# Patient Record
Sex: Male | Born: 1950
Health system: Southern US, Community
[De-identification: ages and names within clinical notes are randomized; demographics above are authoritative.]

## PROBLEM LIST (undated history)

## (undated) DIAGNOSIS — E119 Type 2 diabetes mellitus without complications: Secondary | ICD-10-CM

## (undated) DIAGNOSIS — G4730 Sleep apnea, unspecified: Secondary | ICD-10-CM

## (undated) DIAGNOSIS — M1990 Unspecified osteoarthritis, unspecified site: Secondary | ICD-10-CM

## (undated) DIAGNOSIS — E7800 Pure hypercholesterolemia, unspecified: Secondary | ICD-10-CM

## (undated) DIAGNOSIS — E785 Hyperlipidemia, unspecified: Secondary | ICD-10-CM

## (undated) DIAGNOSIS — C801 Malignant (primary) neoplasm, unspecified: Secondary | ICD-10-CM

## (undated) DIAGNOSIS — K219 Gastro-esophageal reflux disease without esophagitis: Secondary | ICD-10-CM

## (undated) DIAGNOSIS — N189 Chronic kidney disease, unspecified: Secondary | ICD-10-CM

## (undated) DIAGNOSIS — R739 Hyperglycemia, unspecified: Secondary | ICD-10-CM

## (undated) DIAGNOSIS — M109 Gout, unspecified: Secondary | ICD-10-CM

## (undated) DIAGNOSIS — I10 Essential (primary) hypertension: Secondary | ICD-10-CM

## (undated) DIAGNOSIS — Z96649 Presence of unspecified artificial hip joint: Secondary | ICD-10-CM

## (undated) DIAGNOSIS — I700 Atherosclerosis of aorta: Secondary | ICD-10-CM

## (undated) HISTORY — DX: Hyperlipidemia, unspecified: E78.5

## (undated) HISTORY — PX: NASAL SEPTUM SURGERY: SHX37

## (undated) HISTORY — PX: OTHER SURGICAL HISTORY: SHX169

## (undated) HISTORY — PX: HERNIA REPAIR: SHX51

## (undated) HISTORY — DX: Hyperglycemia, unspecified: R73.9

## (undated) HISTORY — PX: COLONOSCOPY: SHX174

## (undated) HISTORY — DX: Presence of unspecified artificial hip joint: Z96.649

## (undated) HISTORY — DX: Gout, unspecified: M10.9

## (undated) HISTORY — DX: Atherosclerosis of aorta: I70.0

---

## 2010-09-01 ENCOUNTER — Other Ambulatory Visit: Payer: Self-pay | Admitting: Urology

## 2010-09-01 ENCOUNTER — Ambulatory Visit (INDEPENDENT_AMBULATORY_CARE_PROVIDER_SITE_OTHER): Payer: Managed Care, Other (non HMO) | Admitting: Urology

## 2010-09-01 DIAGNOSIS — N529 Male erectile dysfunction, unspecified: Secondary | ICD-10-CM

## 2010-09-01 DIAGNOSIS — R972 Elevated prostate specific antigen [PSA]: Secondary | ICD-10-CM

## 2010-09-01 DIAGNOSIS — R978 Other abnormal tumor markers: Secondary | ICD-10-CM

## 2010-10-06 ENCOUNTER — Other Ambulatory Visit: Payer: Self-pay | Admitting: Urology

## 2010-10-06 ENCOUNTER — Inpatient Hospital Stay (HOSPITAL_COMMUNITY): Admission: RE | Admit: 2010-10-06 | Discharge: 2010-10-06 | Payer: Self-pay | Source: Ambulatory Visit

## 2010-10-06 ENCOUNTER — Ambulatory Visit (HOSPITAL_COMMUNITY)
Admission: RE | Admit: 2010-10-06 | Discharge: 2010-10-06 | Disposition: A | Discharge status: Home / Self Care | Payer: Managed Care, Other (non HMO) | Source: Ambulatory Visit | Attending: Urology | Admitting: Urology

## 2010-10-06 DIAGNOSIS — R972 Elevated prostate specific antigen [PSA]: Secondary | ICD-10-CM

## 2010-10-06 NOTE — Progress Notes (Signed)
Prostate bx done under local. Procedure start time 1202-1212. 12 prostate biopsies taken to [facility] lab by Dr.Dahlstedt.

## 2010-11-17 ENCOUNTER — Ambulatory Visit (INDEPENDENT_AMBULATORY_CARE_PROVIDER_SITE_OTHER): Payer: Managed Care, Other (non HMO) | Admitting: Urology

## 2010-11-17 DIAGNOSIS — C61 Malignant neoplasm of prostate: Secondary | ICD-10-CM

## 2011-02-05 ENCOUNTER — Ambulatory Visit (INDEPENDENT_AMBULATORY_CARE_PROVIDER_SITE_OTHER): Payer: Managed Care, Other (non HMO) | Admitting: Urology

## 2011-02-05 DIAGNOSIS — N500 Atrophy of testis: Secondary | ICD-10-CM

## 2011-02-05 DIAGNOSIS — C61 Malignant neoplasm of prostate: Secondary | ICD-10-CM

## 2011-02-19 ENCOUNTER — Other Ambulatory Visit: Payer: Self-pay | Admitting: Urology

## 2011-04-09 ENCOUNTER — Encounter (HOSPITAL_COMMUNITY): Payer: Self-pay | Admitting: Pharmacy Technician

## 2011-04-15 ENCOUNTER — Encounter (HOSPITAL_COMMUNITY): Payer: Self-pay

## 2011-04-15 ENCOUNTER — Inpatient Hospital Stay (HOSPITAL_COMMUNITY)
Admission: RE | Admit: 2011-04-15 | Discharge: 2011-04-15 | Disposition: A | Discharge status: Home / Self Care | Payer: Managed Care, Other (non HMO) | Source: Ambulatory Visit | Attending: Urology | Admitting: Urology

## 2011-04-15 HISTORY — DX: Unspecified osteoarthritis, unspecified site: M19.90

## 2011-04-15 HISTORY — DX: Chronic kidney disease, unspecified: N18.9

## 2011-04-15 HISTORY — DX: Malignant (primary) neoplasm, unspecified: C80.1

## 2011-04-15 LAB — CBC
HCT: 42.8 % (ref 39.0–52.0)
MCHC: 33.2 g/dL (ref 30.0–36.0)
RDW: 13.4 % (ref 11.5–15.5)

## 2011-04-15 LAB — BASIC METABOLIC PANEL
BUN: 14 mg/dL (ref 6–23)
GFR calc Af Amer: 83 mL/min — ABNORMAL LOW (ref >90)
GFR calc non Af Amer: 72 mL/min — ABNORMAL LOW (ref >90)
Potassium: 4.5 mEq/L (ref 3.5–5.1)

## 2011-04-15 LAB — SURGICAL PCR SCREEN
MRSA, PCR: NEGATIVE
Staphylococcus aureus: POSITIVE — AB

## 2011-04-15 MED ORDER — CEFAZOLIN SODIUM 1-5 GM-% IV SOLN: 1.0000 g | INTRAVENOUS | Status: DC

## 2011-04-15 NOTE — Patient Instructions (Signed)
20 Thurmon Mizell  04/15/2011   Your procedure is scheduled on:  04/20/11 [id number] am  Report to [facility] at 0515 AM.  Call this number if you have problems the morning of surgery: [phone]   Remember:   Do not eat food:After Midnight.  May have clear liquids:until Midnight .  Clear liquids include soda, tea, black coffee, apple or grape juice, broth.  Take these medicines the morning of surgery with A SIP OF WATER:    Do not wear jewelry,   Do not wear lotions, powders, or perfumes.   .  Do not bring valuables to the hospital.  Contacts, dentures or bridgework may not be worn into surgery.  Leave suitcase in the car. After surgery it may be brought to your room.  For patients admitted to the hospital, checkout time is 11:00 AM the day of discharge.      Special Instructions: CHG Shower Use Special Wash: 1/2 bottle night before surgery and 1/2 bottle morning of surgery. shower chin to toes with CHG.  Wash face and private parts with regular soap.     Please read over the following fact sheets that you were given: MRSA Information, coughing and deep breathing exercises, leg exercises, Blood Transfusion Fact Sheet

## 2011-04-16 NOTE — Pre-Procedure Instructions (Signed)
04/16/11 Nurse left message on home phone regarding positvie pcr screen for staph.  Asked pt to call back and confirm he received message.

## 2011-04-16 NOTE — Pre-Procedure Instructions (Signed)
04/16/11 Pt received message regarding PCR.  Will follow instructions.

## 2011-04-19 NOTE — H&P (Signed)
Active Problems 1. Organic Impotence 607.84 2. Prostate Cancer 185 3. PSA,Elevated 790.93 4. Testicular Atrophy 608.3  History of Present Illness  [name] [name] is a 61 yo [facility] patient of Dr. Retta Diones with a T1c Gleason 7(3+4) prostate cancer found in the right lateral core on biopsy on 8/17.  His prostate was 24cc and his PSA was 4.65.  He has seen Dr. Kathrynn Running but he is still most interested in surgical therapy.   Past Medical History 1. History of  Gout 274.9 2. History of  Hypercholesterolemia 272.0  Surgical History 1. History of  Biopsy Of The Prostate Needle 2. History of  Inguinal Hernia Repair Left 3. History of  Nasal Septal Deviation Repair  Current Meds 1. Allopurinol 100 MG Oral Tablet; Therapy: (Recorded:12Jun2012) to 2. Calcium TABS; Therapy: (Recorded:12Jun2012) to 3. Ibuprofen 200 MG Oral Capsule; Therapy: (Recorded:12Jun2012) to 4. Levofloxacin 500 MG Oral Tablet; One tablet the day before procedure, the day of procedure, and  the day after procedure; Therapy: 12Jun2012 to (Evaluate:16Jun2012)  Requested for:  12Jun2012; Last Rx:12Jun2012 5. Magnesium CAPS; Therapy: (Recorded:12Jun2012) to 6. Multi-Vitamin TABS; Therapy: (Recorded:12Jun2012) to 7. Niacin 500 MG Oral Tablet; Therapy: (Recorded:12Jun2012) to 8. Omega 3 CAPS; Therapy: (Recorded:12Jun2012) to 9. Pravastatin Sodium 80 MG Oral Tablet; Therapy: (Recorded:12Jun2012) to 10. Vitamin B Complex CAPS; Therapy: (Recorded:12Jun2012) to 11. Vitamin E CAPS; Therapy: (Recorded:12Jun2012) to 12. Zinc TABS; Therapy: (Recorded:12Jun2012) to  Allergies 1. No Known Drug Allergies  Family History 1. Family history of  Cancer 2. Family history of  Death In The Family Father 3. Family history of  Death In The Family Mother 4. Family history of  Diabetes Mellitus V18.0 5. Family history of  Hypertension V17.49 6. Fraternal history of  Prostate Cancer V16.42 Denied  7. Family history of  Family Health Status Number Of  Children  Social History 1. Alcohol Use 2/wk 2. Caffeine Use 6/d 3. Former Smoker V15.82 1 ppd x 8 yrs. Quit 1986. 4. Marital History - Single 5. Occupation: Clinical dietary mgr  Review of Systems Genitourinary, constitutional, skin, eye, otolaryngeal, hematologic/lymphatic, cardiovascular, pulmonary, endocrine, musculoskeletal, gastrointestinal, neurological and psychiatric system(s) were reviewed and pertinent findings if present are noted.  Genitourinary: post-void dribbling, decreased libido (mild) and erectile dysfunction (mild).  Cardiovascular: no chest pain and no leg swelling.  Respiratory: no shortness of breath.  Psychiatric: anxiety.    Vitals Vital Signs [Data Includes: Last 1 Day]  16Nov2012 09:05AM  BMI Calculated: 31.83 BSA Calculated: 2.09 Height: 5 ft 8 in Weight: 210 lb  Blood Pressure: 156 / 88 Temperature: 97.2 F Heart Rate: 79  Physical Exam Constitutional: Well nourished and well developed . No acute distress.  ENT:. The ears and nose are normal in appearance.  Neck: The appearance of the neck is normal and no neck mass is present.  Pulmonary: No respiratory distress and normal respiratory rhythm and effort.  Cardiovascular: Heart rate and rhythm are normal . No peripheral edema.  Abdomen: The abdomen is soft and nontender. No masses are palpated. No CVA tenderness. No hernias are palpable. No hepatosplenomegaly noted.  Rectal: Rectal exam demonstrates normal sphincter tone, no tenderness and no masses. Estimated prostate size is 2+. (with mild induration at the right base). The prostate has no nodularity and is not tender. The left seminal vesicle is nonpalpable. The right seminal vesicle is nonpalpable. The perineum is normal on inspection.  Genitourinary: Examination of the penis demonstrates no discharge, no masses, no lesions and a normal meatus. The scrotum is without lesions. The  right epididymis is palpably normal and non-tender. The left  epididymis is palpably normal and non-tender. The right testis is atrophic, but non-tender and without masses. The left testis is atrophic, but non-tender and without masses.  Lymphatics: The supraclavicular, axillary, femoral and inguinal nodes are not enlarged or tender.  Skin: Normal skin turgor, no visible rash and no visible skin lesions.  Neuro/Psych:. Mood and affect are appropriate.    Results/Data Urine [Data Includes: Last 1 Day]   16Nov2012  COLOR YELLOW   APPEARANCE CLEAR   SPECIFIC GRAVITY 1.020   pH 5.0   GLUCOSE NEG mg/dL  BILIRUBIN NEG   KETONE NEG mg/dL  BLOOD NEG   PROTEIN NEG mg/dL  UROBILINOGEN 0.2 mg/dL  NITRITE NEG   LEUKOCYTE ESTERASE NEG    Old records or history reviewed: Records from Dr. Kathrynn Running and Dahlstedt reviewed.  The following clinical lab reports were reviewed:  Path report and labs reviewed.    Assessment 1. Prostate Cancer 185 2. PSA,Elevated 790.93 3. 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 Testicular Atrophy 608.3   He has T1c Gleason 7 prostate cancer and is interested in surgical therapy.   Plan Prostate Cancer (185)  1. PT Referral Referral  Referral  Requested for: 16Nov2012 PSA,Elevated (790.93)  2. PSA  Requested for: 16Nov2012 3. Follow-up Schedule Surgery Office  Follow-up  Requested for: 16Nov2012 Testicular Atrophy (608.3)  4. TESTOSTERONE  Requested for: 16Nov2012   I am going to recheck his PSA and testosterone. He will be set up for a RALP with nerve spare and nodes.  I have reviewed the risks as noted below.   Discussion/Summary  The patient was counseled about the natural history of prostate cancer and the standard treatment options that are available for prostate cancer. It was explained to him how his age and life expectancy, clinical stage, Gleason score, and PSA affect his prognosis, the decision to proceed with additional staging studies, as well as how that information influences recommended treatment strategies. We  discussed the roles for active surveillance, radiation therapy, surgical therapy, androgen deprivation, as well as ablative therapy options for the treatment of prostate cancer as appropriate to his individual cancer situation. We discussed the risks and benefits of these options with regard to their impact on cancer control and also in terms of potential adverse events, complications, and impact on quiality of life particularly related to urinary, bowel, and sexual function. The patient was encouraged to ask questions throughout the discussion today and all questions were answered to his stated satisfaction. In addition, the patient was provided with and/or directed to appropriate resources and literature for further education about prostate cancer and treatment options.   We discussed surgical therapy for prostate cancer including the different available surgical approaches. We discussed, in detail, the risks and expectations of surgery with regard to cancer control, urinary control, and erectile function as well as the expected postoperative recovery process. Additional risks of surgery including but not limited to bleeding, infection, hernia formation, nerve damage, lymphocele formation, bowel/rectal injury potentially necessitating colostomy, damage to the urinary tract resulting in urine leakage, urethral stricture, and the cardiopulmonary risks such as myocardial infarction, stroke, death, venothromboembolism, etc. were explained. The risk of open surgical conversion for robotic/laparoscopic prostatectomy was also discussed.   30 minutes were spent in face to face consultation with patient today.

## 2011-04-20 ENCOUNTER — Inpatient Hospital Stay (HOSPITAL_COMMUNITY): Payer: Managed Care, Other (non HMO) | Admitting: Anesthesiology

## 2011-04-20 ENCOUNTER — Other Ambulatory Visit: Payer: Self-pay | Admitting: Urology

## 2011-04-20 ENCOUNTER — Inpatient Hospital Stay (HOSPITAL_COMMUNITY)
Admission: RE | Admit: 2011-04-20 | Discharge: 2011-04-21 | DRG: 708 | Disposition: A | Discharge status: Home / Self Care | Payer: Managed Care, Other (non HMO) | Source: Ambulatory Visit | Attending: Urology | Admitting: Urology

## 2011-04-20 ENCOUNTER — Encounter (HOSPITAL_COMMUNITY): Payer: Self-pay | Admitting: *Deleted

## 2011-04-20 ENCOUNTER — Encounter (HOSPITAL_COMMUNITY)
Admission: RE | Disposition: A | Discharge status: Home / Self Care | Payer: Self-pay | Source: Ambulatory Visit | Attending: Urology

## 2011-04-20 ENCOUNTER — Encounter (HOSPITAL_COMMUNITY): Payer: Self-pay | Admitting: Anesthesiology

## 2011-04-20 DIAGNOSIS — R972 Elevated prostate specific antigen [PSA]: Secondary | ICD-10-CM | POA: Diagnosis present

## 2011-04-20 DIAGNOSIS — N500 Atrophy of testis: Secondary | ICD-10-CM | POA: Diagnosis present

## 2011-04-20 DIAGNOSIS — M109 Gout, unspecified: Secondary | ICD-10-CM | POA: Diagnosis present

## 2011-04-20 DIAGNOSIS — C61 Malignant neoplasm of prostate: Principal | ICD-10-CM | POA: Diagnosis present

## 2011-04-20 DIAGNOSIS — Z87891 Personal history of nicotine dependence: Secondary | ICD-10-CM

## 2011-04-20 DIAGNOSIS — E7800 Pure hypercholesterolemia, unspecified: Secondary | ICD-10-CM | POA: Diagnosis present

## 2011-04-20 DIAGNOSIS — N529 Male erectile dysfunction, unspecified: Secondary | ICD-10-CM | POA: Diagnosis present

## 2011-04-20 HISTORY — PX: ROBOT ASSISTED LAPAROSCOPIC RADICAL PROSTATECTOMY: SHX5141

## 2011-04-20 LAB — HEMOGLOBIN AND HEMATOCRIT, BLOOD
HCT: 42.6 % (ref 39.0–52.0)
Hemoglobin: 14.1 g/dL (ref 13.0–17.0)

## 2011-04-20 LAB — TYPE AND SCREEN

## 2011-04-20 SURGERY — ROBOTIC ASSISTED LAPAROSCOPIC RADICAL PROSTATECTOMY
Anesthesia: General | Site: Abdomen | Wound class: Clean

## 2011-04-20 MED ORDER — GLYCOPYRROLATE 0.2 MG/ML IJ SOLN
INTRAMUSCULAR | Status: DC | PRN
  Administered 2011-04-20: .8 mg via INTRAVENOUS

## 2011-04-20 MED ORDER — CEFAZOLIN SODIUM-DEXTROSE 2-3 GM-% IV SOLR
2.0000 g | Freq: Once | INTRAVENOUS | Status: AC
  Administered 2011-04-20: 2 g via INTRAVENOUS

## 2011-04-20 MED ORDER — ACETAMINOPHEN 10 MG/ML IV SOLN
1000.0000 mg | Freq: Four times a day (QID) | INTRAVENOUS | Status: AC
  Administered 2011-04-20 – 2011-04-21 (×4): 1000 mg via INTRAVENOUS
  Filled 2011-04-20 (×4): qty 100

## 2011-04-20 MED ORDER — DOCUSATE SODIUM 100 MG PO CAPS
100.0000 mg | ORAL_CAPSULE | Freq: Two times a day (BID) | ORAL | Status: DC
  Administered 2011-04-20 – 2011-04-21 (×2): 100 mg via ORAL
  Filled 2011-04-20 (×3): qty 1

## 2011-04-20 MED ORDER — HYDROMORPHONE HCL PF 1 MG/ML IJ SOLN
INTRAMUSCULAR | Status: DC | PRN
  Administered 2011-04-20 (×3): 0.5 mg via INTRAVENOUS

## 2011-04-20 MED ORDER — ACETAMINOPHEN 325 MG PO TABS: 650.0000 mg | ORAL_TABLET | ORAL | Status: DC | PRN

## 2011-04-20 MED ORDER — ALLOPURINOL 100 MG PO TABS
100.0000 mg | ORAL_TABLET | Freq: Every day | ORAL | Status: DC
  Administered 2011-04-20 – 2011-04-21 (×2): 100 mg via ORAL
  Filled 2011-04-20 (×2): qty 1

## 2011-04-20 MED ORDER — ONDANSETRON HCL 4 MG/2ML IJ SOLN
INTRAMUSCULAR | Status: DC | PRN
  Administered 2011-04-20: 4 mg via INTRAVENOUS

## 2011-04-20 MED ORDER — ZOLPIDEM TARTRATE 5 MG PO TABS: 5.0000 mg | ORAL_TABLET | Freq: Every evening | ORAL | Status: DC | PRN

## 2011-04-20 MED ORDER — PROMETHAZINE HCL 25 MG/ML IJ SOLN: 6.2500 mg | INTRAMUSCULAR | Status: DC | PRN

## 2011-04-20 MED ORDER — DIPHENHYDRAMINE HCL 12.5 MG/5ML PO ELIX
12.5000 mg | ORAL_SOLUTION | Freq: Four times a day (QID) | ORAL | Status: DC | PRN
  Filled 2011-04-20: qty 5

## 2011-04-20 MED ORDER — ROCURONIUM BROMIDE 100 MG/10ML IV SOLN
INTRAVENOUS | Status: DC | PRN
  Administered 2011-04-20: 50 mg via INTRAVENOUS
  Administered 2011-04-20: 15 mg via INTRAVENOUS
  Administered 2011-04-20: 20 mg via INTRAVENOUS
  Administered 2011-04-20 (×2): 15 mg via INTRAVENOUS

## 2011-04-20 MED ORDER — KCL IN DEXTROSE-NACL 20-5-0.45 MEQ/L-%-% IV SOLN
INTRAVENOUS | Status: DC
  Administered 2011-04-20 – 2011-04-21 (×3): via INTRAVENOUS
  Filled 2011-04-20 (×8): qty 1000

## 2011-04-20 MED ORDER — FENTANYL CITRATE 0.05 MG/ML IJ SOLN
INTRAMUSCULAR | Status: DC | PRN
  Administered 2011-04-20 (×2): 50 ug via INTRAVENOUS
  Administered 2011-04-20: 100 ug via INTRAVENOUS
  Administered 2011-04-20: 50 ug via INTRAVENOUS

## 2011-04-20 MED ORDER — HYDROMORPHONE HCL PF 1 MG/ML IJ SOLN
0.5000 mg | INTRAMUSCULAR | Status: DC | PRN
  Administered 2011-04-20 – 2011-04-21 (×2): 1 mg via INTRAVENOUS
  Filled 2011-04-20 (×3): qty 1

## 2011-04-20 MED ORDER — SODIUM CHLORIDE 0.9 % IR SOLN
Status: DC | PRN
  Administered 2011-04-20: 1000 mL

## 2011-04-20 MED ORDER — HYOSCYAMINE SULFATE 0.125 MG SL SUBL
0.1250 mg | SUBLINGUAL_TABLET | SUBLINGUAL | Status: DC | PRN
  Filled 2011-04-20: qty 1

## 2011-04-20 MED ORDER — SODIUM CHLORIDE 0.9 % IV SOLN
INTRAVENOUS | Status: DC | PRN
  Administered 2011-04-20: 11:00:00 via INTRAVENOUS

## 2011-04-20 MED ORDER — LACTATED RINGERS IV SOLN
INTRAVENOUS | Status: DC | PRN
  Administered 2011-04-20: 07:00:00 via INTRAVENOUS

## 2011-04-20 MED ORDER — CEFAZOLIN SODIUM 1-5 GM-% IV SOLN
1.0000 g | Freq: Three times a day (TID) | INTRAVENOUS | Status: AC
  Administered 2011-04-20 – 2011-04-21 (×3): 1 g via INTRAVENOUS
  Filled 2011-04-20 (×3): qty 50

## 2011-04-20 MED ORDER — MIDAZOLAM HCL 5 MG/5ML IJ SOLN
INTRAMUSCULAR | Status: DC | PRN
  Administered 2011-04-20: 2 mg via INTRAVENOUS

## 2011-04-20 MED ORDER — PROPOFOL 10 MG/ML IV EMUL
INTRAVENOUS | Status: DC | PRN
  Administered 2011-04-20: 200 mg via INTRAVENOUS

## 2011-04-20 MED ORDER — HYDROMORPHONE HCL PF 1 MG/ML IJ SOLN: 0.2500 mg | INTRAMUSCULAR | Status: DC | PRN

## 2011-04-20 MED ORDER — OXYCODONE-ACETAMINOPHEN 5-325 MG PO TABS: 1.0000 | ORAL_TABLET | ORAL | Status: DC | PRN

## 2011-04-20 MED ORDER — NEOSTIGMINE METHYLSULFATE 1 MG/ML IJ SOLN
INTRAMUSCULAR | Status: DC | PRN
  Administered 2011-04-20: 5 mg via INTRAVENOUS

## 2011-04-20 MED ORDER — SIMVASTATIN 40 MG PO TABS
40.0000 mg | ORAL_TABLET | Freq: Every day | ORAL | Status: DC
  Administered 2011-04-20: 40 mg via ORAL
  Filled 2011-04-20 (×2): qty 1

## 2011-04-20 MED ORDER — BELLADONNA ALKALOIDS-OPIUM 16.2-60 MG RE SUPP
1.0000 | Freq: Four times a day (QID) | RECTAL | Status: DC | PRN
  Administered 2011-04-20: 1 via RECTAL
  Filled 2011-04-20: qty 1

## 2011-04-20 MED ORDER — ESMOLOL HCL 10 MG/ML IV SOLN
INTRAVENOUS | Status: DC | PRN
  Administered 2011-04-20: 20 mg via INTRAVENOUS

## 2011-04-20 MED ORDER — DEXAMETHASONE SODIUM PHOSPHATE 10 MG/ML IJ SOLN
INTRAMUSCULAR | Status: DC | PRN
  Administered 2011-04-20: 10 mg via INTRAVENOUS

## 2011-04-20 MED ORDER — DIPHENHYDRAMINE HCL 50 MG/ML IJ SOLN: 12.5000 mg | Freq: Four times a day (QID) | INTRAMUSCULAR | Status: DC | PRN

## 2011-04-20 MED ORDER — LACTATED RINGERS IV SOLN: INTRAVENOUS | Status: DC

## 2011-04-20 MED ORDER — ONDANSETRON HCL 4 MG/2ML IJ SOLN: 4.0000 mg | INTRAMUSCULAR | Status: DC | PRN

## 2011-04-20 MED ORDER — BUPIVACAINE-EPINEPHRINE 0.25% -1:200000 IJ SOLN
INTRAMUSCULAR | Status: DC | PRN
  Administered 2011-04-20: 20 mL

## 2011-04-20 MED ORDER — DROPERIDOL 2.5 MG/ML IJ SOLN
INTRAMUSCULAR | Status: DC | PRN
  Administered 2011-04-20: 0.625 mg via INTRAVENOUS

## 2011-04-20 MED ORDER — EPHEDRINE SULFATE 50 MG/ML IJ SOLN
INTRAMUSCULAR | Status: DC | PRN
  Administered 2011-04-20: 7.5 mg via INTRAVENOUS

## 2011-04-20 MED ORDER — INDIGOTINDISULFONATE SODIUM 8 MG/ML IJ SOLN
INTRAMUSCULAR | Status: DC | PRN
  Administered 2011-04-20 (×2): 5 mL via INTRAVENOUS

## 2011-04-20 SURGICAL SUPPLY — 39 items
CANISTER SUCTION 2500CC (MISCELLANEOUS) ×3 IMPLANT
CATH FOLEY 2WAY SLVR  5CC 20FR (CATHETERS) ×1
CATH FOLEY 2WAY SLVR 5CC 20FR (CATHETERS) ×2 IMPLANT
CATH ROBINSON RED A/P 8FR (CATHETERS) ×3 IMPLANT
CHLORAPREP W/TINT 26ML (MISCELLANEOUS) ×3 IMPLANT
CLIP LIGATING HEM O LOK PURPLE (MISCELLANEOUS) ×6 IMPLANT
CLOTH BEACON ORANGE TIMEOUT ST (SAFETY) ×3 IMPLANT
CORD HIGH FREQUENCY UNIPOLAR (ELECTROSURGICAL) ×3 IMPLANT
COVER SURGICAL LIGHT HANDLE (MISCELLANEOUS) ×3 IMPLANT
COVER TIP SHEARS 8 DVNC (MISCELLANEOUS) ×2 IMPLANT
COVER TIP SHEARS 8MM DA VINCI (MISCELLANEOUS) ×1
CUTTER ECHEON FLEX ENDO 45 340 (ENDOMECHANICALS) ×3 IMPLANT
DECANTER SPIKE VIAL GLASS SM (MISCELLANEOUS) ×3 IMPLANT
DRAPE SURG IRRIG POUCH 19X23 (DRAPES) ×3 IMPLANT
DRSG TEGADERM 6X8 (GAUZE/BANDAGES/DRESSINGS) ×6 IMPLANT
ELECT REM PT RETURN 9FT ADLT (ELECTROSURGICAL) ×3
ELECTRODE REM PT RTRN 9FT ADLT (ELECTROSURGICAL) ×2 IMPLANT
GAUZE SPONGE 4X4 12PLY STRL LF (GAUZE/BANDAGES/DRESSINGS) ×3 IMPLANT
GLOVE BIO SURGEON STRL SZ 6.5 (GLOVE) ×3 IMPLANT
GLOVE SURG SS PI 8.0 STRL IVOR (GLOVE) ×9 IMPLANT
GOWN STRL NON-REIN LRG LVL3 (GOWN DISPOSABLE) ×12 IMPLANT
GOWN STRL REIN XL XLG (GOWN DISPOSABLE) ×3 IMPLANT
HOLDER FOLEY CATH W/STRAP (MISCELLANEOUS) ×3 IMPLANT
IV LACTATED RINGERS 1000ML (IV SOLUTION) ×3 IMPLANT
KIT ACCESSORY DA VINCI DISP (KITS) ×1
KIT ACCESSORY DVNC DISP (KITS) ×2 IMPLANT
NDL SAFETY ECLIPSE 18X1.5 (NEEDLE) ×2 IMPLANT
NEEDLE HYPO 18GX1.5 SHARP (NEEDLE) ×1
PACK ROBOT UROLOGY CUSTOM (CUSTOM PROCEDURE TRAY) ×3 IMPLANT
POSITIONER SURGICAL ARM (MISCELLANEOUS) ×6 IMPLANT
RELOAD GREEN ECHELON 45 (STAPLE) ×3 IMPLANT
SEALER TISSUE G2 CVD JAW 45CM (ENDOMECHANICALS) ×3 IMPLANT
SET TUBE IRRIG SUCTION NO TIP (IRRIGATION / IRRIGATOR) ×3 IMPLANT
SOLUTION ELECTROLUBE (MISCELLANEOUS) ×3 IMPLANT
SPONGE GAUZE 4X4 12PLY (GAUZE/BANDAGES/DRESSINGS) ×3 IMPLANT
SUT VICRYL 0 UR6 27IN ABS (SUTURE) ×3 IMPLANT
SYR 27GX1/2 1ML LL SAFETY (SYRINGE) ×3 IMPLANT
TAPE HYPAFIX 4 X10 (GAUZE/BANDAGES/DRESSINGS) ×3 IMPLANT
WATER STERILE IRR 1500ML POUR (IV SOLUTION) ×6 IMPLANT

## 2011-04-20 NOTE — Progress Notes (Signed)
MEDICATION RELATED CONSULT NOTE - INITIAL   Pharmacy Consult for Antibiotic adjustment for renal fx  Labs: No results found for this basename: WBC:3,HGB:3,HCT:3,PLT:3,APTT:3;INR:3,CREATININE:3,LABCREA:3,CREATININE:3,CREAT24HRUR:3,MG:3,PHOS:3,ALBUMIN:3,PROT:3,ALBUMIN:3,AST:3,ALT:3,ALKPHOS:3,BILITOT:3,BILIDIR:3,IBILI:3 in the last 72 hours The CrCl is unknown because both a height and weight (above a minimum accepted value) are required for this calculation.   Microbiology: Recent Results (from the past 720 hour(s))  SURGICAL PCR SCREEN     Status: Abnormal   Collection Time   04/15/11 12:52 PM      Component Value Range Status Comment   MRSA, PCR NEGATIVE  NEGATIVE  Final    Staphylococcus aureus POSITIVE (*) NEGATIVE  Final     Medical History: Past Medical History  Diagnosis Date  . Chronic kidney disease     prostate cancer   . Arthritis     right knee and hip   . Cancer     prostate cancer     Medications:  Anti-infectives     Start     Dose/Rate Route Frequency Ordered Stop   04/20/11 1600   ceFAZolin (ANCEF) IVPB 1 g/50 mL premix        1 g 100 mL/hr over 30 Minutes Intravenous 3 times per day 04/20/11 1316 04/21/11 1359   04/20/11 0530   ceFAZolin (ANCEF) IVPB 2 g/50 mL premix        2 g 100 mL/hr over 30 Minutes Intravenous  Once 04/20/11 0522 04/20/11 0740          Assessment: Renal fx stable, no adjustment needed to Ancef.  Will follow for any further meds needing adjustment.  Chilton Si, Annica Marinello L 04/20/2011,1:31 PM

## 2011-04-20 NOTE — Progress Notes (Signed)
Day of Surgery Subjective: Patient reports pain control good.  Denies N/V.  No complaints.  Objective: Vital signs in last 24 hours: Temp:  [96.8 F (36 C)-98.2 F (36.8 C)] 97.4 F (36.3 C) (01/29 1307) Pulse Rate:  [62-85] 79  (01/29 1307) Resp:  [7-18] 14  (01/29 1307) BP: (136-171)/(80-89) 171/82 mmHg (01/29 1307) SpO2:  [98 %-100 %] 100 % (01/29 1307)  Intake/Output this shift: Total I/O In: 1300 [I.V.:1300] Out: 440 [Urine:160; Drains:30; Blood:250]  Physical Exam:  General:alert, cooperative and no distress Cardiovascular: RRR Lungs: CTA GI: soft Incisions: dressings clean Urine: blue Extremities: warm  Lab Results:  Basename 04/20/11 1320  HGB 14.1  HCT 42.6   BMET No results found for this basename: NA:2,K:2,CL:2,CO2:2,GLUCOSE:2,BUN:2,CREATININE:2,CALCIUM:2 in the last 72 hours No results found for this basename: LABPT:3,INR:3 in the last 72 hours No results found for this basename: LABURIN:1 in the last 72 hours Results for orders placed during the hospital encounter of 04/20/11  SURGICAL PCR SCREEN     Status: Abnormal   Collection Time   04/15/11 12:52 PM      Component Value Range Status Comment   MRSA, PCR NEGATIVE  NEGATIVE  Final    Staphylococcus aureus POSITIVE (*) NEGATIVE  Final     Assessment/Plan: Day of Surgery, Procedure(s) (LRB): ROBOTIC ASSISTED LAPAROSCOPIC RADICAL PROSTATECTOMY (N/A)  Doing well Ambulate, Incentive spirometry DVT prophylaxis Clear liquids   LOS: 0 days   Silas Flood. 04/20/2011, 2:28 PM

## 2011-04-20 NOTE — Anesthesia Preprocedure Evaluation (Signed)
Anesthesia Evaluation  Patient identified by MRN, date of birth, ID band Patient awake    Reviewed: Allergy & Precautions, H&P , NPO status , Patient's Chart, lab work & pertinent test results  Airway Mallampati: II TM Distance: >3 FB Neck ROM: Full    Dental No notable dental hx.    Pulmonary neg pulmonary ROS,  clear to auscultation  Pulmonary exam normal       Cardiovascular neg cardio ROS Regular Normal    Neuro/Psych Negative Neurological ROS  Negative Psych ROS   GI/Hepatic negative GI ROS, Neg liver ROS,   Endo/Other  Morbid obesity  Renal/GU Renal InsufficiencyRenal disease  Genitourinary negative   Musculoskeletal negative musculoskeletal ROS (+)   Abdominal   Peds negative pediatric ROS (+)  Hematology negative hematology ROS (+)   Anesthesia Other Findings   Reproductive/Obstetrics negative OB ROS                           Anesthesia Physical Anesthesia Plan  ASA: II  Anesthesia Plan: General   Post-op Pain Management:    Induction: Intravenous  Airway Management Planned: Oral ETT  Additional Equipment:   Intra-op Plan:   Post-operative Plan: Extubation in OR  Informed Consent: I have reviewed the patients History and Physical, chart, labs and discussed the procedure including the risks, benefits and alternatives for the proposed anesthesia with the patient or authorized representative who has indicated his/her understanding and acceptance.   Dental advisory given  Plan Discussed with: CRNA  Anesthesia Plan Comments:         Anesthesia Quick Evaluation

## 2011-04-20 NOTE — Interval H&P Note (Signed)
History and Physical Interval Note:  04/20/2011 7:18 AM  [name] [name]  has presented today for surgery, with the diagnosis of prostate cancer  The various methods of treatment have been discussed with the patient and family. After consideration of risks, benefits and other options for treatment, the patient has consented to  Procedure(s): ROBOTIC ASSISTED LAPAROSCOPIC RADICAL PROSTATECTOMY LYMPH NODE DISSECTION as a surgical intervention .  The patients' history has been reviewed, patient examined, no change in status, stable for surgery.  I have reviewed the patients' chart and labs.  Questions were answered to the patient's satisfaction.     Shalah Estelle J

## 2011-04-20 NOTE — Transfer of Care (Signed)
Immediate Anesthesia Transfer of Care Note  Patient: [name] [name]  Procedure(s) Performed:  ROBOTIC ASSISTED LAPAROSCOPIC RADICAL PROSTATECTOMY - Bilateral lymph node dissection  Patient Location: PACU  Anesthesia Type: General  Level of Consciousness: awake, sedated and patient cooperative  Airway & Oxygen Therapy: Patient Spontanous Breathing and Patient connected to face mask oxygen  Post-op Assessment: Report given to PACU RN and Post -op Vital signs reviewed and stable  Post vital signs: Reviewed and stable  Complications: No apparent anesthesia complications

## 2011-04-20 NOTE — Brief Op Note (Signed)
04/20/2011  11:24 AM  PATIENT:  Lawerance Bach  61 y.o. male  PRE-OPERATIVE DIAGNOSIS:  prostate cancer  POST-OPERATIVE DIAGNOSIS:  prostate cancer  PROCEDURE:  Procedure(s): ROBOTIC ASSISTED LAPAROSCOPIC RADICAL PROSTATECTOMY WITH PELVIC NODE DISSECTION  SURGEON:  Surgeon(s): Anner Crete, MD Antony Haste, MD  PHYSICIAN ASSISTANT:   ASSISTANTS: Dr. Karma Greaser.   ANESTHESIA:   general  EBL:  Total I/O In: 1000 [I.V.:1000] Out: 350 [Urine:100; Blood:250]  BLOOD ADMINISTERED:  DRAINS: (49fr) Jackson-Pratt drain(s) with closed bulb suction in the LLQ and Urinary Catheter (Foley)   LOCAL MEDICATIONS USED:  MARCAINE 25CC  SPECIMEN:  Source of Specimen:  Prostate with SV's and bilateral pelvic nodes.  DISPOSITION OF SPECIMEN:  PATHOLOGY  COUNTS:  YES  TOURNIQUET:  * No tourniquets in log *  DICTATION: .Other Dictation: Dictation Number [id number]  PLAN OF CARE: Admit to inpatient   PATIENT DISPOSITION:  PACU - hemodynamically stable.   Delay start of Pharmacological VTE agent (>24hrs) due to surgical blood loss or risk of bleeding:  {YES/NO/NOT APPLICABLE:20182

## 2011-04-20 NOTE — Progress Notes (Signed)
04-20-11 2315 NSG  Day rn had been concerned about JP drainage and saturation on JP dressing.  She talked to Dr Patsi Sears at [phone].  At 2000 on my first assessment I marked the JP dressing and there has not been an increase on dressing in the past 4 hours since then.  Pt has walked for me 2 times in the past 4 hours.  Urine has gone from bloody without clots to almost amber without clots c good output.  JP has drained  180cc in past 4 hours - and has been steadily decreasing.  BP remains elevated at 169/95 and day rn stated that Dr Patsi Sears was alerted to this as well and was not concerned just asked [place] to continue to monitor it and notify if it increases or pt is symptomatic.  Pt NAD.  Will continue to monitor.

## 2011-04-20 NOTE — Anesthesia Postprocedure Evaluation (Signed)
  Anesthesia Post-op Note  Patient: [name] [name]  Procedure(s) Performed:  ROBOTIC ASSISTED LAPAROSCOPIC RADICAL PROSTATECTOMY - Bilateral lymph node dissection  Patient Location: PACU  Anesthesia Type: General  Level of Consciousness: awake and alert   Airway and Oxygen Therapy: Patient Spontanous Breathing  Post-op Pain: mild  Post-op Assessment: Post-op Vital signs reviewed, Patient's Cardiovascular Status Stable, Respiratory Function Stable, Patent Airway and No signs of Nausea or vomiting  Post-op Vital Signs: stable  Complications: No apparent anesthesia complications

## 2011-04-21 LAB — HEMOGLOBIN AND HEMATOCRIT, BLOOD: HCT: 33.3 % — ABNORMAL LOW (ref 39.0–52.0)

## 2011-04-21 MED ORDER — CIPROFLOXACIN HCL 500 MG PO TABS: 500.0000 mg | ORAL_TABLET | Freq: Two times a day (BID) | ORAL | Status: AC

## 2011-04-21 MED ORDER — DOCUSATE SODIUM 100 MG PO CAPS: 100.0000 mg | ORAL_CAPSULE | Freq: Two times a day (BID) | ORAL | Status: AC

## 2011-04-21 MED ORDER — HYDROCODONE-ACETAMINOPHEN 5-325 MG PO TABS
1.0000 | ORAL_TABLET | Freq: Four times a day (QID) | ORAL | Status: DC | PRN
Start: 2011-04-21 — End: 2021-08-06

## 2011-04-21 MED FILL — Heparin Sodium (Porcine) Inj 1000 Unit/ML: INTRAMUSCULAR | Qty: 1 | Status: AC

## 2011-04-21 NOTE — Op Note (Signed)
NAMEMarland Kitchen  [name], [name] NO.:  [id number]  MEDICAL RECORD NO.:  [id number]  LOCATION:  1424                         FACILITY:  [name]  PHYSICIAN:  Excell Seltzer. Annabell Howells, M.D.    DATE OF BIRTH:  1950-09-29  DATE OF PROCEDURE:  04/20/2011 DATE OF DISCHARGE:                              OPERATIVE REPORT   Patient of Dr. Bjorn [name].  PREOPERATIVE DIAGNOSIS:  Stage T1c Gleason 7 adenocarcinoma of the prostate.  POSTOP DIAGNOSIS:  Stage T1c Gleason 7 adenocarcinoma of the prostate.  PROCEDURE:  Robot-assisted laparoscopic radical prostatectomy with bilateral pelvic lymph node dissection.  SURGEON:  Excell Seltzer. Annabell Howells, M.D.  ASSISTANT:  Jerilee Field, M.D.  ANESTHESIA:  General.  SPECIMEN:  Prostate and seminal vesicles and bilateral pelvic lymph nodes.  DRAINS:  19-French Blake drain and 20-French Foley catheter.  BLOOD LOSS:  100 cc.  COMPLICATIONS:  None.  INDICATIONS:  [name] is a 61 year old white male who was initially evaluated by Dr. Retta [name] for an elevated PSA of approximately 4.6. She was found to have a prostate volume of 24 cc and has 1 core positive with 20% Gleason 7, 3+4 at the right base.  He has elected radical prostatectomy.  FINDINGS OF PROCEDURE:  He was taken to the operating room, where general anesthetic was induced.  He was placed in the low lithotomy position.  He was fitted with PAS hose.  He was given 2 g of Ancef.  A red rubber rectal catheter was placed and secured to his left thigh and placed to an aseptic syringe.  His abdomen was clipped and his arms were tucked.  He was prepped with Betadine solution into the genitalia and ChloraPrep for the abdomen.  Once the prep had been placed and dried, he was placed in a steep Trendelenburg position per routine, a Mayo stand was placed over his head and tape was elevated appropriately.  He was then draped in the usual sterile fashion.  A 22-French Foley catheter was inserted and the  balloon was filled with 30 cc of sterile fluid. The catheter was connected to irrigation tubing.  The video port was then marked 18 cm superior to the cephalad to the pubis just above the umbilicus and a 2 cm incision was made with a knife.  This was carried down through subcutaneous tissue with retractors and hemostat.  The anterior rectus fascia was identified and incised vertically for 1 cm. Hemostat was then passed through into the perineum followed by finger to inspect the intraperitoneal cavity, no adhesions were noted.  A 12 mm port was then placed.  The skin was wrapped, secured around the port with a 0-Vicryl suture.  The abdomen was then insufflated and inspected with the laparoscope, but there were some minimal adhesions in the right pelvis and left pelvis, but nothing that would impact of procedure.  At this point, the remaining port sites were marked.  The port sites were infiltrated with 0.25% Marcaine and two 8 mm robot ports were placed on the left in the standard configuration.  An 8 mm robot port was placed in the right medial lower quadrant, a 12 mm assistant port was placed in the right  lateral lower quadrant and a 5 mm system port was placed in the right medial upper quadrant.  Once all ports were in position, the robot was brought onto the field and docked, having been draped earlier.  Once in position, the dissection was initiated incising the peritoneum transversely above the bladder.  The obliterated umbilical arteries were divided using the cautery scissors and the bladder was dropped away from the anterior abdominal wall, exposed  the pubis.  The bladder had been filled for this maneuver, but was then drained.  The anterior prostate was then exposed and defatted.  The right endopelvic fascia was incised and the lateral prostatic dissection was performed, this was followed by the right incision and right endopelvic fascia.  The puboprostatic ligaments were  incised at their attachment to the pubis and the dorsal vein complex was dissected out.  The dorsal vein complex was then ligated using a figure-of-eight 2-0 Vicryl tie, second tie was placed on the prostate side to control back-bleeding.  At this point, the Foley catheter balloon was used to identify the bladder neck, which was incised transversely.  Once the bladder neck was opened, the Foley balloon was deflated and the Foley was then used to provide anterior traction.  The posterior bladder neck was then divided with cautery scissors and the structures were exposed.  The left vasal ampulla was identified, dissected out, and divided and used to provide anterior traction, this was followed by the identification of the right ampulla, which was dissected out and divided.  The right seminal vesicle was then dissected out.  The tip of the seminal vesicles was not readily dissected out and was left in situ to the right and the left seminal vesicle was dissected out intact.  At this point, posteriorly, his Denonvilliers fascia was incised and the posterior plane was developed between the prostate and the rectum.  The right nerve-sparing dissection was then performed by incising the periprostatic fascia and dissected the neurovascular bundle off the lateral prostate, the right pedicle was then taken down using the EnSeal device and additional lateral attachments were dissected out using sharp and blunt dissection to the apex.  The left nerve spare dissection was then performed once again with incision and tied the periprostatic fascia and developing the plane between the prostate and the neurovascular bundle.  The left pedicle was taken down with the EnSeal and once again the residual attachments were taken down with sharp and blunt dissection to the apex.  At this point, the dorsal vein complex was divided with the cautery scissors.  Once the urethra was identified, it was divided with  cold shears.  Residual apical and rectourethralis attachments were divided and the prostate was removed from the field into the abdominal cavity.  The pelvic fossa was then irrigated.  The rectum was insufflated.  No evidence of rectal injury was noted.  The irrigant was aspirated.  At this point, we turned our attention to the right lymph node dissection.  The iliac vein was identified and the lymph node packet was developed off the iliac vein down to the sidewall until the obturator nerve was identified.  The lymphatic channels were controlled with large clips and the packet was removed.  The limits of the dissection were the external iliac vein, the circumflex iliac vein, the obturator nerve, and the bifurcation of the iliac artery.  The specimen was removed from the abdomen.  The left lymph node dissection was then performed in an identical fashion once again  with the initial identification of the vein followed by identification of the obturator nerve with these large clips to control lymphatic channels.  The limits of dissection were the same as on the right.  At this point, we turned our attention to the anastomosis.  A 2-0 Vicryl was used to place a tethering stitch between the posterior bladder neck, and posterior urethra  incorporating the rectourethralis complex at the cut edge of Denonvilliers fascia.  A slip knot was used to allow approximation.  Once the urethra was approximated, running 4-0 Vicryl and Monocryl were used to complete the vesicourethral anastomosis.  Once the initial surgeon's knot was placed on urethrovesical anastomosis, a fresh 20-French coude Foley catheter was inserted and the balloon was filled with 15 cc of sterile fluid and the bladder was irrigated with return of blue stained urine if the patient had received indigo carmine and intervals through the procedure to aid ureteral identification.  Once the anastomotic tie was complete, inspection of the  pelvic fossa revealed no active bleeding.  A 19-French round Harrison Mons drain was placed through the 4th arm port and the port was removed.  The drain was secured with a nylon to the skin.  The robot was then undocked.  The prostate was placed in an entrapment sac, and the remaining ports were removed.  The 12 mm assistant port was closed with a 0-Vicryl tie and a suture passer.  The remaining ports were removed under visual inspection with no evidence of bleeding.  At this point, the video port was opened with the Bovie sufficient to remove the specimen.  Once the specimen had been removed, the anterior rectus sheath was closed with a running 0-Vicryl suture and the umbilical wound was irrigated.  The remaining port sites were infiltrated with the remaining 0.25% Marcaine for a total of 25 cc injected and the Ioban drape was removed, and the scanning of the port sites.  An supraumbilical incision was closed with clips.  The Foley catheter was irrigated once again with clear with blue stained return, and the catheter was placed to straight drainage.  The drapes were removed.  Dressings were applied.  The patient had been taken out of Trendelenburg position after the robot had been undocked.  He was taken down from lithotomy position.  His anesthetic was reversed.  He was moved to recovery room in stable condition.  There were no complications during the procedure, although the patient's body habitus, I could make the dissection somewhat tedious and procedure took approximately an hour longer than expected.     Excell Seltzer. Annabell Howells, M.D.     JJW/MEDQ  D:  04/20/2011  T:  04/21/2011  Job:  [id number]

## 2011-04-21 NOTE — Plan of Care (Signed)
Problem: Discharge Progression Outcomes Goal: Tubes and drains discontinued if indicated Outcome: Not Applicable Date Met:  04/21/11 Foley in place

## 2011-04-21 NOTE — Plan of Care (Signed)
Problem: Phase I Progression Outcomes Goal: Incision/dressings dry and intact Outcome: Not Applicable Date Met:  04/21/11 No sutures now-these were removed at JP site

## 2011-04-21 NOTE — Plan of Care (Signed)
Problem: Phase I Progression Outcomes Goal: Voiding-avoid urinary catheter unless indicated Outcome: Completed/Met Date Met:  04/21/11 Foley in place-leg bag teaching done. Leg bag and new foley and dressings given to pt.

## 2011-04-21 NOTE — Plan of Care (Signed)
Problem: Phase I Progression Outcomes Goal: Other Phase I Outcomes/Goals Outcome: Completed/Met Date Met:  04/21/11 Home care with foley done

## 2011-04-21 NOTE — Discharge Summary (Signed)
Physician Discharge Summary  Patient ID: [name] MRN: [id number] DOB/AGE: 04-19-1950 61 y.o.  Admit date: 04/20/2011 Discharge date: 04/21/2011  Admission Diagnoses: T1c Prostate Cancer  Discharge Diagnoses: T1c Prostate Cancer Principal Problem:  *Prostate cancer   Discharged Condition: good  Hospital Course: [name] had a RALP with NS and PLND on 04/20/11 for T1c Gleason 7 prostate cancer.  His post op course was uneventful and his urine was clear on 1/30.   He has minimal bloody JP drainage and the drain was removed.  He was tolerating clear liquids and was felt to be ready for D/C home.  Consults: None  Significant Diagnostic Studies: labs: Hgb 14.1 postop and 11.1 this AM.  Treatments: surgery: RALP with NS and PLND.  Discharge Exam: Blood pressure 142/89, pulse 79, temperature 97.7 F (36.5 C), temperature source Oral, resp. rate 16, height 5\' 8"  (1.727 m), weight 96 kg (211 lb 10.3 oz), SpO2 94.00%. General appearance: alert and no distress Resp: clear to auscultation bilaterally Cardio: regular rate and rhythm GI: soft, non-tender; bowel sounds normal; no masses,  no organomegaly. Wound dressings dry.  Disposition: Final discharge disposition not confirmed   Medication List  As of 04/21/2011  8:29 AM   TAKE these medications         allopurinol 100 MG tablet   Commonly known as: ZYLOPRIM   Take 100 mg by mouth daily.      b complex vitamins tablet   Take 1 tablet by mouth daily.      Calcium-Magnesium-Zinc 1000-500-50 MG Tabs   Take 1 tablet by mouth daily.      ciprofloxacin 500 MG tablet   Commonly known as: CIPRO   Take 1 tablet (500 mg total) by mouth 2 (two) times daily. Start day before f/u visit      docusate sodium 100 MG capsule   Commonly known as: COLACE   Take 1 capsule (100 mg total) by mouth 2 (two) times daily.      Fish Oil 1200 MG Caps   Take 1 capsule by mouth daily.      HYDROcodone-acetaminophen 5-325 MG per tablet   Commonly known as: NORCO   Take 1 tablet by mouth every 6 (six) hours as needed for pain.      ibuprofen 200 MG tablet   Commonly known as: ADVIL,MOTRIN   Take 200 mg by mouth every 8 (eight) hours as needed. Pain        multivitamin tablet   Take 1 tablet by mouth daily.      naproxen sodium 220 MG tablet   Commonly known as: ANAPROX   Take 220 mg by mouth at bedtime.      niacin 500 MG tablet   Take 500 mg by mouth daily.      pravastatin 80 MG tablet   Commonly known as: PRAVACHOL   Take 80 mg by mouth at bedtime.      SUPER B COMPLEX PO   Take 1 tablet by mouth daily.      vitamin E 400 UNIT capsule   Take 400 Units by mouth daily.           Follow-up Information    Follow up with [facility], NP on 04/27/2011. ([phone])    Contact information:   [address] [facility] [address] [facility] [place] [address] [phone]          Signed: Anner Crete 04/21/2011, 8:29 AM

## 2011-04-21 NOTE — Plan of Care (Signed)
Problem: Phase II Progression Outcomes Goal: Sutures/staples intact Outcome: Not Applicable Date Met:  04/21/11 JP was removed-no sutures now

## 2011-04-21 NOTE — Plan of Care (Signed)
Problem: Discharge Progression Outcomes Goal: Barriers To Progression Addressed/Resolved Outcome: Completed/Met Date Met:  04/21/11 Teaching on changing to leg bag done

## 2011-04-21 NOTE — Progress Notes (Signed)
04/21/11 1242. Patient had JP removed with two small sutures. Foley was changed to leg bag and pt. was educated about catheter care. PIV was removed. Patient is eager to go home.

## 2011-04-21 NOTE — Plan of Care (Signed)
Problem: Phase II Progression Outcomes Goal: Foley discontinued Outcome: Not Applicable Date Met:  04/21/11 Pt. Has foley to go home with and leg bag teaching done

## 2011-04-21 NOTE — Plan of Care (Signed)
Problem: Phase I Progression Outcomes Goal: Sutures/staples intact Outcome: Not Applicable Date Met:  04/21/11 Incisions are intact with dressings on them. JP was removed-sutures were removed

## 2011-04-22 ENCOUNTER — Encounter (HOSPITAL_COMMUNITY): Payer: Self-pay | Admitting: Urology

## 2012-02-25 ENCOUNTER — Ambulatory Visit (INDEPENDENT_AMBULATORY_CARE_PROVIDER_SITE_OTHER): Payer: Managed Care, Other (non HMO) | Admitting: Urology

## 2012-02-25 DIAGNOSIS — R6882 Decreased libido: Secondary | ICD-10-CM

## 2012-02-25 DIAGNOSIS — N500 Atrophy of testis: Secondary | ICD-10-CM

## 2012-02-25 DIAGNOSIS — N529 Male erectile dysfunction, unspecified: Secondary | ICD-10-CM

## 2012-02-25 DIAGNOSIS — Z8546 Personal history of malignant neoplasm of prostate: Secondary | ICD-10-CM

## 2012-04-14 ENCOUNTER — Ambulatory Visit (INDEPENDENT_AMBULATORY_CARE_PROVIDER_SITE_OTHER): Payer: Managed Care, Other (non HMO) | Admitting: Urology

## 2012-04-14 DIAGNOSIS — N529 Male erectile dysfunction, unspecified: Secondary | ICD-10-CM

## 2012-04-14 DIAGNOSIS — E291 Testicular hypofunction: Secondary | ICD-10-CM

## 2012-04-14 DIAGNOSIS — Z8546 Personal history of malignant neoplasm of prostate: Secondary | ICD-10-CM

## 2012-07-11 ENCOUNTER — Telehealth: Payer: Self-pay | Admitting: Family Medicine

## 2012-07-11 NOTE — Telephone Encounter (Signed)
Patient needs BW paper °

## 2012-07-12 ENCOUNTER — Other Ambulatory Visit: Payer: Self-pay

## 2012-07-12 DIAGNOSIS — Z79899 Other long term (current) drug therapy: Secondary | ICD-10-CM

## 2012-07-12 DIAGNOSIS — E785 Hyperlipidemia, unspecified: Secondary | ICD-10-CM

## 2012-07-12 DIAGNOSIS — R7301 Impaired fasting glucose: Secondary | ICD-10-CM

## 2012-07-12 NOTE — Telephone Encounter (Signed)
Lipid,A1C,Hepatic panel ordered in system and faxed to [facility]. Left message notifying patient that blood work has been ordered.

## 2012-07-12 NOTE — Telephone Encounter (Signed)
Lipid,A1C,Hepatic panel

## 2012-07-14 ENCOUNTER — Ambulatory Visit (INDEPENDENT_AMBULATORY_CARE_PROVIDER_SITE_OTHER): Payer: Managed Care, Other (non HMO) | Admitting: Urology

## 2012-07-14 DIAGNOSIS — Z8546 Personal history of malignant neoplasm of prostate: Secondary | ICD-10-CM

## 2012-07-14 DIAGNOSIS — E291 Testicular hypofunction: Secondary | ICD-10-CM

## 2012-07-14 DIAGNOSIS — N529 Male erectile dysfunction, unspecified: Secondary | ICD-10-CM

## 2012-07-26 ENCOUNTER — Other Ambulatory Visit: Payer: Self-pay | Admitting: Family Medicine

## 2012-08-11 LAB — HEPATIC FUNCTION PANEL
AST: 21 U/L (ref 0–37)
Albumin: 4 g/dL (ref 3.5–5.2)
Alkaline Phosphatase: 54 U/L (ref 39–117)
Bilirubin, Direct: 0.2 mg/dL (ref 0.0–0.3)
Indirect Bilirubin: 1 mg/dL — ABNORMAL HIGH (ref 0.0–0.9)
Total Bilirubin: 1.2 mg/dL (ref 0.3–1.2)

## 2012-08-11 LAB — LIPID PANEL
Cholesterol: 173 mg/dL (ref 0–200)
Triglycerides: 154 mg/dL — ABNORMAL HIGH (ref <150)
VLDL: 31 mg/dL (ref 0–40)

## 2012-08-11 LAB — HEMOGLOBIN A1C: Mean Plasma Glucose: 117 mg/dL — ABNORMAL HIGH (ref <117)

## 2012-08-16 ENCOUNTER — Encounter: Payer: Self-pay | Admitting: *Deleted

## 2012-08-21 ENCOUNTER — Encounter: Payer: Self-pay | Admitting: Family Medicine

## 2012-08-21 ENCOUNTER — Ambulatory Visit (INDEPENDENT_AMBULATORY_CARE_PROVIDER_SITE_OTHER): Payer: Managed Care, Other (non HMO) | Admitting: Family Medicine

## 2012-08-21 VITALS — BP 138/70 | HR 88 | Ht 67.0 in | Wt 218.2 lb

## 2012-08-21 DIAGNOSIS — M25551 Pain in right hip: Secondary | ICD-10-CM

## 2012-08-21 DIAGNOSIS — M25559 Pain in unspecified hip: Secondary | ICD-10-CM

## 2012-08-21 DIAGNOSIS — Z0000 Encounter for general adult medical examination without abnormal findings: Secondary | ICD-10-CM

## 2012-08-21 DIAGNOSIS — M25569 Pain in unspecified knee: Secondary | ICD-10-CM

## 2012-08-21 DIAGNOSIS — M25561 Pain in right knee: Secondary | ICD-10-CM

## 2012-08-21 NOTE — Progress Notes (Signed)
  Subjective:    Patient ID: [name], male    DOB: February 03, 1951, 62 y.o.   MRN: [id number]  HPIPatient is here today for wellness exam. He relates right hip pain and right knee pain hurts with certain movements and with walking is limited range of motion. Denies any injury to it. He also states his bowels moving well urinating fine he has history of prostate cancer had surgery so far has not returned he is overall feeling very good. He denies any wheezing shortness of breath nausea vomiting or chest pressure. He does try to manage his health by eating properly and taking his medicines as directed. PMH benign family history benign social history does not smoke   r hip  pain Review of Systems  Constitutional: Negative for fever, activity change and appetite change.  HENT: Negative for congestion, rhinorrhea and neck pain.   Eyes: Negative for discharge.  Respiratory: Negative for cough and wheezing.   Cardiovascular: Negative for chest pain.  Gastrointestinal: Negative for vomiting, abdominal pain and blood in stool.  Genitourinary: Negative for frequency and difficulty urinating.  Skin: Negative for rash.  Allergic/Immunologic: Negative for environmental allergies and food allergies.  Neurological: Negative for weakness and headaches.  Psychiatric/Behavioral: Negative for agitation.       Objective:   Physical Exam  Vitals reviewed. Constitutional: He appears well-developed and well-nourished.  HENT:  Head: Normocephalic and atraumatic.  Right Ear: External ear normal.  Left Ear: External ear normal.  Nose: Nose normal.  Mouth/Throat: Oropharynx is clear and moist.  Eyes: EOM are normal. Pupils are equal, round, and reactive to light.  Neck: Normal range of motion. Neck supple. No thyromegaly present.  Cardiovascular: Normal rate, regular rhythm and normal heart sounds.   No murmur heard. Pulmonary/Chest: Effort normal and breath sounds normal. No respiratory distress. He has  no wheezes.  Abdominal: Soft. Bowel sounds are normal. He exhibits no distension and no mass. There is no tenderness.  Genitourinary: Penis normal.  Musculoskeletal: Normal range of motion. He exhibits no edema.  Lymphadenopathy:    He has no cervical adenopathy.  Neurological: He is alert. He exhibits normal muscle tone.  Skin: Skin is warm and dry. No erythema.  Psychiatric: He has a normal mood and affect. His behavior is normal. Judgment normal.          Assessment & Plan:  Extensive exam done today. Does have some limitation in range of motion in the right hip though. He also has some crepitus in the right knee I would recommend x-rays of this. His wellness exam otherwise is good. Continue current measures next time we check lab work we will want to check a uric acid level. His testosterone levels are followed by the urologist.Followup again in 6 months

## 2012-08-23 ENCOUNTER — Telehealth: Payer: Self-pay | Admitting: Family Medicine

## 2012-08-23 NOTE — Telephone Encounter (Signed)
Orders were placed by Dr Lorin Picket on  08/21/12-Patient notified.

## 2012-08-23 NOTE — Telephone Encounter (Signed)
Patient needs an order for an xray for his right knee and hip sent to [facility]

## 2012-08-25 ENCOUNTER — Ambulatory Visit (INDEPENDENT_AMBULATORY_CARE_PROVIDER_SITE_OTHER): Payer: Managed Care, Other (non HMO) | Admitting: Urology

## 2012-08-25 DIAGNOSIS — Z8546 Personal history of malignant neoplasm of prostate: Secondary | ICD-10-CM

## 2012-08-25 DIAGNOSIS — N529 Male erectile dysfunction, unspecified: Secondary | ICD-10-CM

## 2012-08-29 ENCOUNTER — Ambulatory Visit (HOSPITAL_COMMUNITY)
Admission: RE | Admit: 2012-08-29 | Discharge: 2012-08-29 | Disposition: A | Discharge status: Home / Self Care | Payer: Managed Care, Other (non HMO) | Source: Ambulatory Visit | Attending: Family Medicine | Admitting: Family Medicine

## 2012-08-29 DIAGNOSIS — M25551 Pain in right hip: Secondary | ICD-10-CM

## 2012-08-29 DIAGNOSIS — M25561 Pain in right knee: Secondary | ICD-10-CM

## 2012-08-29 DIAGNOSIS — M169 Osteoarthritis of hip, unspecified: Secondary | ICD-10-CM | POA: Insufficient documentation

## 2012-08-29 DIAGNOSIS — M25559 Pain in unspecified hip: Secondary | ICD-10-CM | POA: Insufficient documentation

## 2012-08-29 DIAGNOSIS — M1610 Unilateral primary osteoarthritis, unspecified hip: Secondary | ICD-10-CM | POA: Insufficient documentation

## 2012-09-05 ENCOUNTER — Ambulatory Visit (INDEPENDENT_AMBULATORY_CARE_PROVIDER_SITE_OTHER): Payer: Managed Care, Other (non HMO)

## 2012-09-05 ENCOUNTER — Encounter: Payer: Self-pay | Admitting: Orthopedic Surgery

## 2012-09-05 ENCOUNTER — Ambulatory Visit (INDEPENDENT_AMBULATORY_CARE_PROVIDER_SITE_OTHER): Payer: Managed Care, Other (non HMO) | Admitting: Orthopedic Surgery

## 2012-09-05 VITALS — BP 148/88 | Ht 67.0 in | Wt 214.0 lb

## 2012-09-05 DIAGNOSIS — M1610 Unilateral primary osteoarthritis, unspecified hip: Secondary | ICD-10-CM

## 2012-09-05 DIAGNOSIS — M549 Dorsalgia, unspecified: Secondary | ICD-10-CM

## 2012-09-05 DIAGNOSIS — M169 Osteoarthritis of hip, unspecified: Secondary | ICD-10-CM

## 2012-09-05 MED ORDER — ETODOLAC 400 MG PO TABS: 400.0000 mg | ORAL_TABLET | Freq: Two times a day (BID) | ORAL | Status: DC

## 2012-09-05 NOTE — Progress Notes (Signed)
Patient ID: [name], male   DOB: Feb 22, 1951, 62 y.o.   MRN: [id number] Chief Complaint  Patient presents with  . Pain    Pain in right knee,hip and lower back    62 year old male with right knee and right hip pain along with back pain presents with complaints of dull throbbing pain in his lower back radiating down his right leg. He denies anterior thigh pain or groin pain and complains of 7/10 intensity with dull throbbing sensation which is worse with sitting. He does notice it is hard for him to do things such as bending over nail care and he is more worried about his back and his hip. He can't cross his legs.  Review of systems weight gain and fatigue unsteady gait easy bleeding easy bruising all other systems were reviewed and were negative  Has no known drug allergies. He listed no major medical problems but he takes allopurinol for gout and he has high cholesterol with pravastatin 80 mg to treat that. He had prostate cancer in 2013 he had surgery for that and then he had a right herniorrhaphy.  He uses [facility] as a pharmacy his family physician is Dr. Lilyan Punt  Has a family history of heart disease, arthritis, diabetes  Social history he is single, is a Clinical cytogeneticist, he does not smoke he drinks 2 beers a day or 12 ounce glass of wine  BP 148/88  Ht 5\' 7"  (1.702 m)  Wt 214 lb (97.07 kg)  BMI 33.51 kg/m2 General appearance is normal, the patient is alert and oriented x 3 with normal mood and affect.  He is walking with a significant limp. The right and left upper extremity are without visible abnormality, range of motion restriction, contracture, instability subluxation atrophy weakness or changes of the skin. Has normal pulses and temperature in both upper extremities and normal sensation.  Left hip and left lower extremity as well as left leg is slightly longer than the right. No tenderness. Normal flexion and extension of the hip with painless normal  internal and external rotation. Hip is stable. Muscle tone normal. Skin normal. Pulse normal. Temperature normal. Sensation normal.  Right hip has decreased internal rotation but no pain with internal rotation no tenderness strength stability are normal skin is normal sensation is normal  Supraclavicular areas no lymphadenopathy. Lower extremities negative pathologic reflexes negative straight leg raises  Overall balance normal.  X-rays of the hip were from the hospital also of the knee he has severe degenerative arthritis of the right hip with bone-on-bone changes and abnormal contour of the femoral head. He has decreased joint space on the left hip just not as bad  His right knee shows mild arthrosis  His lumbar spine was x-rayed here in the office and he has multilevel degenerative disc disease to account for his back pain  Diagnosis osteoarthritis right hip                  Lumbar degenerative disc disease with back pain  Assessment: His back symptoms far outweighs hip symptoms although his hip symptoms seem to be causing him some functional disability. I would like to try to treat his back pain initially with physical therapy and continue his etodolac which she got good relief from after his dental procedure. I will see him in a month wrist says his situation he would need a right total hip replacement

## 2012-09-05 NOTE — Patient Instructions (Addendum)
START PT   CONTINUE ETODOLAC   RETURN 1 MONTH   Total Hip Replacement Total hip replacement is the replacement of your damaged hip with an artificial hip joint (prosthetic hip joint). The purpose of this surgery is to reduce pain and improve your hip function. LET YOUR CAREGIVER KNOW ABOUT:   Any allergies you have.  Any medicines you are taking, including vitamins, herbs, eyedrops, over-the-counter medicines, and creams.  Any problems you have had with the use of anesthetics.  Family history of problems with the use of anesthetics.  Any blood disorders you have, including bleeding problems or clotting problems.  Previous surgeries you have had. RISKS AND COMPLICATIONS Generally, total hip replacement is a safe procedure. However, as with any surgical procedure, complications can occur. Complications associated with total hip replacement both during and after the procedure include:  Infection.  Dislocation (the ball of the hip-joint prosthesis comes out of contact with the socket).  Loosening of the stem connected to the ball or socket.  Fracture of the bone while inserting the prosthesis.  Formation of blood clots, which can break loose and travel to and injure your lungs (pulmonary embolus). BEFORE THE PROCEDURE   Your caregiver will instruct you when you need to stop eating and drinking.  Ask your caregiver if you need to change or stop any regular medicines. PROCEDURE Just before the procedure, you will receive medicine that will make you drowsy (sedative) or medicine to make you fall asleep (general anesthetic). This will be given through a tube that is inserted into one of your veins (intravenous [IV] tube). Then you will receive medicine to block pain from the waist down through your legs (spinal block). An incision is made in your hip. Your surgeon will take out any damaged cartilage and bone. Next, your surgeon will insert a prosthetic socket into your pelvic bone.  This is usually secured with screws. Then, your surgeon will cut off the ball of your thigh bone (femur) and attach a prosthetic ball on a stem to your femur. The surgeon then places the ball into the socket and checks the range of motion of your new hip. AFTER THE PROCEDURE  You will be taken to the recovery area where a nurse will watch and check your progress. Once you are awake and stable, you will be taken to a hospital room. You will receive physical therapy until you are doing well and your caregiver feels it is safe for you to go home. Typically, you will stay in the hospital 1 4 days after your procedure. Document Released: 06/14/2000 Document Revised: 09/07/2011 Document Reviewed: 04/25/2011 [facility] Patient Information 2014 [place], [place].  Back Pain, Adult Low back pain is very common. About 1 in 5 people have back pain.The cause of low back pain is rarely dangerous. The pain often gets better over time.About half of people with a sudden onset of back pain feel better in just 2 weeks. About 8 in 10 people feel better by 6 weeks.  CAUSES Some common causes of back pain include:  Strain of the muscles or ligaments supporting the spine.  Wear and tear (degeneration) of the spinal discs.  Arthritis.  Direct injury to the back. DIAGNOSIS Most of the time, the direct cause of low back pain is not known.However, back pain can be treated effectively even when the exact cause of the pain is unknown.Answering your caregiver's questions about your overall health and symptoms is one of the most accurate ways to make sure  the cause of your pain is not dangerous. If your caregiver needs more information, he or she may order lab work or imaging tests (X-rays or MRIs).However, even if imaging tests show changes in your back, this usually does not require surgery. HOME CARE INSTRUCTIONS For many people, back pain returns.Since low back pain is rarely dangerous, it is often a condition that  people can learn to [facility] their own.   Remain active. It is stressful on the back to sit or stand in one place. Do not sit, drive, or stand in one place for more than 30 minutes at a time. Take short walks on level surfaces as soon as pain allows.Try to increase the length of time you walk each day.  Do not stay in bed.Resting more than 1 or 2 days can delay your recovery.  Do not avoid exercise or work.Your body is made to move.It is not dangerous to be active, even though your back may hurt.Your back will likely heal faster if you return to being active before your pain is gone.  Pay attention to your body when you bend and lift. Many people have less discomfortwhen lifting if they bend their knees, keep the load close to their bodies,and avoid twisting. Often, the most comfortable positions are those that put less stress on your recovering back.  Find a comfortable position to sleep. Use a firm mattress and lie on your side with your knees slightly bent. If you lie on your back, put a pillow under your knees.  Only take over-the-counter or prescription medicines as directed by your caregiver. Over-the-counter medicines to reduce pain and inflammation are often the most helpful.Your caregiver may prescribe muscle relaxant drugs.These medicines help dull your pain so you can more quickly return to your normal activities and healthy exercise.  Put ice on the injured area.  Put ice in a plastic bag.  Place a towel between your skin and the bag.  Leave the ice on for 15-20 minutes, 3-4 times a day for the first 2 to 3 days. After that, ice and heat may be alternated to reduce pain and spasms.  Ask your caregiver about trying back exercises and gentle massage. This may be of some benefit.  Avoid feeling anxious or stressed.Stress increases muscle tension and can worsen back pain.It is important to recognize when you are anxious or stressed and learn ways to manage it.Exercise is  a great option. SEEK MEDICAL CARE IF:  You have pain that is not relieved with rest or medicine.  You have pain that does not improve in 1 week.  You have new symptoms.  You are generally not feeling well. SEEK IMMEDIATE MEDICAL CARE IF:   You have pain that radiates from your back into your legs.  You develop new bowel or bladder control problems.  You have unusual weakness or numbness in your arms or legs.  You develop nausea or vomiting.  You develop abdominal pain.  You feel faint. Document Released: 03/08/2005 Document Revised: 09/07/2011 Document Reviewed: 07/27/2010 [facility] Patient Information 2014 [place], [place].

## 2012-10-05 ENCOUNTER — Ambulatory Visit (INDEPENDENT_AMBULATORY_CARE_PROVIDER_SITE_OTHER): Payer: Managed Care, Other (non HMO) | Admitting: Orthopedic Surgery

## 2012-10-05 ENCOUNTER — Encounter: Payer: Self-pay | Admitting: Orthopedic Surgery

## 2012-10-05 VITALS — BP 170/90 | Ht 67.0 in | Wt 214.0 lb

## 2012-10-05 DIAGNOSIS — M169 Osteoarthritis of hip, unspecified: Secondary | ICD-10-CM

## 2012-10-05 DIAGNOSIS — M549 Dorsalgia, unspecified: Secondary | ICD-10-CM

## 2012-10-05 NOTE — Progress Notes (Signed)
Subjective:     Patient ID: [name] [name], male   DOB: 10-22-1950, 62 y.o.   MRN: [id number]  Chief Complaint  Patient presents with  . Follow-up    1 month recheck right hip and back s/p therapy    HPI The patient is returning after therapy for his back he has osteoarthritis of the right hip fairly severe with minimal symptoms. He has some back pain as well has radicular symptoms. His main problem is putting his socks on in the morning he denies severe groin or hip pain  The therapy did help him  Review of Systems Again denies numbness or tingling in the right leg    Objective:   Physical Exam     Assessment:     Improved after therapy Encounter Diagnoses  Name Primary?  . Osteoarthritis of hip Yes  . Back pain        Plan:     Return in 3 months for x-ray. As discussed with the patient he has a significant amount of arthritis in that right hip area it is unusual that he wouldn't have more pain at this point. I told him we do not want to let this go to the point where he needs a special cup for revision cup for his primary procedure. When he comes back I will also discuss with him the idea of anterior hip surgery

## 2012-10-18 ENCOUNTER — Telehealth: Payer: Self-pay | Admitting: Orthopedic Surgery

## 2012-10-18 NOTE — Telephone Encounter (Signed)
Patient called to relay that his employer has changed his prescription insurance plan; states has switched to [employer].  Asking about process for having the prescribed medication "switched" to this pharmacy, which was under [place] originally -- he said [facility] is aware:     etodolac (LODINE) 400 MG tablet    Please advise    Patient ph# [phone].

## 2012-10-19 NOTE — Telephone Encounter (Signed)
I sent in verbal order to [facility] per Dr. Romeo Apple

## 2012-11-19 ENCOUNTER — Other Ambulatory Visit: Payer: Self-pay | Admitting: Family Medicine

## 2012-11-23 ENCOUNTER — Ambulatory Visit (INDEPENDENT_AMBULATORY_CARE_PROVIDER_SITE_OTHER): Payer: Managed Care, Other (non HMO) | Admitting: Orthopedic Surgery

## 2012-11-23 ENCOUNTER — Encounter: Payer: Self-pay | Admitting: Orthopedic Surgery

## 2012-11-23 VITALS — BP 144/75 | Ht 67.0 in | Wt 214.0 lb

## 2012-11-23 DIAGNOSIS — M1710 Unilateral primary osteoarthritis, unspecified knee: Secondary | ICD-10-CM

## 2012-11-23 DIAGNOSIS — M1711 Unilateral primary osteoarthritis, right knee: Secondary | ICD-10-CM | POA: Insufficient documentation

## 2012-11-23 NOTE — Patient Instructions (Signed)
You have received a steroid shot. 15% of patients experience increased pain at the injection site with in the next 24 hours. This is best treated with ice and tylenol extra strength 2 tabs every 8 hours. If you are still having pain please call the office.  Take etodolac 1 twice a day

## 2012-11-23 NOTE — Progress Notes (Signed)
Patient ID: [name] [name], male   DOB: 02-03-1951, 62 y.o.   MRN: [id number]  Chief Complaint  Patient presents with  . Follow-up    reevalutae right knee d/t increased pain during therapy    BP 144/75  Ht 5\' 7"  (1.702 m)  Wt 214 lb (97.07 kg)  BMI 33.51 kg/m2  The patient has degenerative disc disease lumbar spine went for therapy did well in terms of his back pain He has severe arthritis of his right hip asymptomatic except for certain activities such as getting dressed He has osteoarthritis of his knee which looks moderate on x-ray and much less disease than his hip and back  He came in earlier than his scheduled appointment because his right leg was hurting and he was having some lateral right knee pain; is also having posterior thigh pain and numbness in his right great toe and lateral knee pain radiating down his leg  Review of systems no hip pain no groin pain no thigh pain no back pain  General appearance is normal, the patient is alert and oriented x3 with normal mood and affect. He is ambulating fairly well slight limp right leg he got a lift in his shoe which helped his limp significantly  No medial joint line tenderness lateral knee tenderness knee is stable no effusion motor exam normal scans intact good pulse normal sensation  Osteoarthritis of the right knee  Question where the symptoms are actually coming from recommend injection as a therapeutic and test diagnostic procedure  Inject right knee followup 2 weeks reassess  Knee  Injection Procedure Note  Pre-operative Diagnosis: right knee oa  Post-operative Diagnosis: same  Indications: pain  Anesthesia: ethyl chloride   Procedure Details   Verbal consent was obtained for the procedure. Time out was completed.The joint was prepped with alcohol, followed by  Ethyl chloride spray and A 20 gauge needle was inserted into the knee via lateral approach; 4ml 1% lidocaine and 1 ml of depomedrol  was then injected  into the joint . The needle was removed and the area cleansed and dressed.  Complications:  None; patient tolerated the procedure well.

## 2012-12-01 ENCOUNTER — Other Ambulatory Visit: Payer: Self-pay

## 2012-12-01 ENCOUNTER — Telehealth: Payer: Self-pay | Admitting: Family Medicine

## 2012-12-01 MED ORDER — ALLOPURINOL 100 MG PO TABS: ORAL_TABLET | ORAL | Status: DC

## 2012-12-01 NOTE — Telephone Encounter (Signed)
allopurinol (ZYLOPRIM) 100 MG tablet please call order into [facility]

## 2012-12-01 NOTE — Telephone Encounter (Signed)
[facility] in refill [facility]. Left message on voicemail notifying patient.

## 2012-12-07 ENCOUNTER — Encounter: Payer: Self-pay | Admitting: Orthopedic Surgery

## 2012-12-07 ENCOUNTER — Ambulatory Visit (INDEPENDENT_AMBULATORY_CARE_PROVIDER_SITE_OTHER): Payer: Managed Care, Other (non HMO) | Admitting: Orthopedic Surgery

## 2012-12-07 VITALS — BP 174/95 | Ht 67.0 in | Wt 214.0 lb

## 2012-12-07 DIAGNOSIS — M1711 Unilateral primary osteoarthritis, right knee: Secondary | ICD-10-CM

## 2012-12-07 DIAGNOSIS — M549 Dorsalgia, unspecified: Secondary | ICD-10-CM

## 2012-12-07 DIAGNOSIS — M169 Osteoarthritis of hip, unspecified: Secondary | ICD-10-CM

## 2012-12-07 DIAGNOSIS — M1710 Unilateral primary osteoarthritis, unspecified knee: Secondary | ICD-10-CM

## 2012-12-07 NOTE — Patient Instructions (Addendum)
Has October appt  Continue medication

## 2012-12-07 NOTE — Progress Notes (Signed)
Patient ID: [name] [name], male   DOB: Aug 05, 1950, 62 y.o.   MRN: [id number]  Chief Complaint  Patient presents with  . Follow-up    2 week recheck right knee s/p injection    Status post injection lateral knee with improvement denies hip pain continues loading with good result  Doing well after injection continue current treatment followup in October review and exam and back knee and hip

## 2013-01-04 ENCOUNTER — Ambulatory Visit: Payer: No Typology Code available for payment source | Admitting: Orthopedic Surgery

## 2013-01-12 ENCOUNTER — Ambulatory Visit (INDEPENDENT_AMBULATORY_CARE_PROVIDER_SITE_OTHER): Payer: Managed Care, Other (non HMO) | Admitting: Urology

## 2013-01-12 DIAGNOSIS — N529 Male erectile dysfunction, unspecified: Secondary | ICD-10-CM

## 2013-01-12 DIAGNOSIS — E291 Testicular hypofunction: Secondary | ICD-10-CM

## 2013-01-12 DIAGNOSIS — Z8546 Personal history of malignant neoplasm of prostate: Secondary | ICD-10-CM

## 2013-01-25 ENCOUNTER — Other Ambulatory Visit: Payer: Self-pay

## 2013-01-27 ENCOUNTER — Other Ambulatory Visit: Payer: Self-pay | Admitting: Family Medicine

## 2013-02-01 ENCOUNTER — Ambulatory Visit (INDEPENDENT_AMBULATORY_CARE_PROVIDER_SITE_OTHER): Payer: Managed Care, Other (non HMO) | Admitting: Orthopedic Surgery

## 2013-02-01 VITALS — BP 184/103 | Ht 67.0 in | Wt 214.0 lb

## 2013-02-01 DIAGNOSIS — M169 Osteoarthritis of hip, unspecified: Secondary | ICD-10-CM

## 2013-02-01 DIAGNOSIS — M1711 Unilateral primary osteoarthritis, right knee: Secondary | ICD-10-CM

## 2013-02-01 DIAGNOSIS — M1710 Unilateral primary osteoarthritis, unspecified knee: Secondary | ICD-10-CM

## 2013-02-01 DIAGNOSIS — M549 Dorsalgia, unspecified: Secondary | ICD-10-CM

## 2013-02-01 NOTE — Patient Instructions (Signed)
Continue etodalac

## 2013-02-01 NOTE — Progress Notes (Signed)
Patient ID: [name] [name], male   DOB: 1950-05-08, 62 y.o.   MRN: [id number] Encounter Diagnoses  Name Primary?  . Back pain Yes  . Osteoarthritis of hip   . Osteoarthritis of right knee     BP 184/103  Ht 5\' 7"  (1.702 m)  Wt 214 lb (97.07 kg)  BMI 33.51 kg/m2  Chief Complaint  Patient presents with  . Follow-up    follow up Hip, Back, and Right knee     Current medication includes Lodine once a day at night  No real complaints at this point doing well minimal hip symptoms no back pain no knee pain status post injection last visit in the right knee  Review of systems negative  Is awake and alert he is oriented x3 his vital signs are stable mood and affect are normal he is walking fairly normally has no swelling in his knee  Continue Lodine followup in 3 months

## 2013-02-02 ENCOUNTER — Telehealth: Payer: Self-pay | Admitting: Family Medicine

## 2013-02-02 NOTE — Telephone Encounter (Signed)
Pt states he has had no stress this week but his BP has been running  180/103, 170/117, 180/?? Wants to know if he should be concerned or what you would suggest he do? You have no open appts for next week. No other symptoms other than sweating in the AM when getting ready for the day

## 2013-02-02 NOTE — Telephone Encounter (Signed)
BP is up, is pt over using salt or taking a lot of anti inflamatories? Are the pressures caussing headaches? ( he will need to be seen by me next week ) may need medication called in based on symptoms

## 2013-02-02 NOTE — Telephone Encounter (Signed)
Spoke with patient about BP is up, is pt over using salt or taking a lot of anti inflamatories? Are the pressures caussing headaches? ( he will need to be seen by me next week ) may need medication called in based on symptoms. He denies any headaches, blurry vision, or dizziness. He does not take in a lot of salt. Dr. Lorin Picket said for him to not take Etodolac this weekend and to take Tylenol instead as needed. Pt verbalized understanding and I transferred him to the front for an appt.

## 2013-02-05 ENCOUNTER — Ambulatory Visit (INDEPENDENT_AMBULATORY_CARE_PROVIDER_SITE_OTHER): Payer: Managed Care, Other (non HMO) | Admitting: Family Medicine

## 2013-02-05 ENCOUNTER — Encounter: Payer: Self-pay | Admitting: Family Medicine

## 2013-02-05 VITALS — BP 160/98 | Ht 68.0 in | Wt 218.4 lb

## 2013-02-05 DIAGNOSIS — R030 Elevated blood-pressure reading, without diagnosis of hypertension: Secondary | ICD-10-CM

## 2013-02-05 DIAGNOSIS — R739 Hyperglycemia, unspecified: Secondary | ICD-10-CM

## 2013-02-05 DIAGNOSIS — R7309 Other abnormal glucose: Secondary | ICD-10-CM

## 2013-02-05 NOTE — Patient Instructions (Signed)
DASH Diet  The DASH diet stands for "Dietary Approaches to Stop Hypertension." It is a healthy eating plan that has been shown to reduce high blood pressure (hypertension) in as little as 14 days, while also possibly providing other significant health benefits. These other health benefits include reducing the risk of breast cancer after menopause and reducing the risk of type 2 diabetes, heart disease, colon cancer, and stroke. Health benefits also include weight loss and slowing kidney failure in patients with chronic kidney disease.   DIET GUIDELINES  · Limit salt (sodium). Your diet should contain less than 1500 mg of sodium daily.  · Limit refined or processed carbohydrates. Your diet should include mostly whole grains. Desserts and added sugars should be used sparingly.  · Include small amounts of heart-healthy fats. These types of fats include nuts, oils, and tub margarine. Limit saturated and trans fats. These fats have been shown to be harmful in the body.  CHOOSING FOODS   The following food groups are based on a 2000 calorie diet. See your Registered Dietitian for individual calorie needs.  Grains and Grain Products (6 to 8 servings daily)  · Eat More Often: Whole-wheat bread, brown rice, whole-grain or wheat pasta, quinoa, popcorn without added fat or salt (air popped).  · Eat Less Often: White bread, white pasta, white rice, cornbread.  Vegetables (4 to 5 servings daily)  · Eat More Often: Fresh, frozen, and canned vegetables. Vegetables may be raw, steamed, roasted, or grilled with a minimal amount of fat.  · Eat Less Often/Avoid: Creamed or fried vegetables. Vegetables in a cheese sauce.  Fruit (4 to 5 servings daily)  · Eat More Often: All fresh, canned (in natural juice), or frozen fruits. Dried fruits without added sugar. One hundred percent fruit juice (½ cup [237 mL] daily).  · Eat Less Often: Dried fruits with added sugar. Canned fruit in light or heavy syrup.  Lean Meats, Fish, and Poultry (2  servings or less daily. One serving is 3 to 4 oz [85-114 g]).  · Eat More Often: Ninety percent or leaner ground beef, tenderloin, sirloin. Round cuts of beef, chicken breast, turkey breast. All fish. Grill, bake, or broil your meat. Nothing should be fried.  · Eat Less Often/Avoid: Fatty cuts of meat, turkey, or chicken leg, thigh, or wing. Fried cuts of meat or fish.  Dairy (2 to 3 servings)  · Eat More Often: Low-fat or fat-free milk, low-fat plain or light yogurt, reduced-fat or part-skim cheese.  · Eat Less Often/Avoid: Milk (whole, 2%). Whole milk yogurt. Full-fat cheeses.  Nuts, Seeds, and Legumes (4 to 5 servings per week)  · Eat More Often: All without added salt.  · Eat Less Often/Avoid: Salted nuts and seeds, canned beans with added salt.  Fats and Sweets (limited)  · Eat More Often: Vegetable oils, tub margarines without trans fats, sugar-free gelatin. Mayonnaise and salad dressings.  · Eat Less Often/Avoid: Coconut oils, palm oils, butter, stick margarine, cream, half and half, cookies, candy, pie.  FOR MORE INFORMATION  The Dash Diet Eating Plan: www.dashdiet.org  Document Released: 02/25/2011 Document Revised: 05/31/2011 Document Reviewed: 02/25/2011  [employer]® Patient Information ©2014 [employer].

## 2013-02-05 NOTE — Progress Notes (Signed)
  Subjective:    Patient ID: [name] [name], male    DOB: 1950-04-11, 62 y.o.   MRN: [id number]  HPI Patient is here today due to high BP's.  Over the weekend, we told him to hold off on the Etodolac to see if his BP would come down, and it did.   It was in the 180's over 100's.  Patient state he is trying eat healthier stay physically active try to bring his weight down he denies any chest tightness pressure pain shortness breath headaches vomiting diarrhea bloody stools. He does have significant arthritis of the hip and the knee. Limits physical exercise. Family history noncontributory review of systems see above    Review of Systems     Objective:   Physical Exam Lungs are clear no crackles heart is regular pulse normal blood pressure mildly elevated extremities no edema  Best reading was 144/94      Assessment & Plan:  Mild HTN-DASH diet along with exercise as tolerated followup if ongoing troubles warning signs discussed recheck again in 8-12 weeks see a blood pressure is may need medications at that point

## 2013-03-02 ENCOUNTER — Other Ambulatory Visit: Payer: Self-pay | Admitting: Family Medicine

## 2013-03-08 ENCOUNTER — Telehealth: Payer: Self-pay | Admitting: Family Medicine

## 2013-03-08 NOTE — Telephone Encounter (Signed)
Patient  Called to let you know about his blood pressure reading from 12/17 140/60 heart rate was 100  On 12/16 today it was 80. He is off work tomorrow if you need to see him.

## 2013-03-08 NOTE — Telephone Encounter (Signed)
[facility]

## 2013-03-08 NOTE — Telephone Encounter (Signed)
Let's have pt f/u Fri am with me, bring cuff from home (if he has one)

## 2013-03-09 ENCOUNTER — Ambulatory Visit (INDEPENDENT_AMBULATORY_CARE_PROVIDER_SITE_OTHER): Payer: Managed Care, Other (non HMO) | Admitting: Family Medicine

## 2013-03-09 ENCOUNTER — Encounter: Payer: Self-pay | Admitting: Family Medicine

## 2013-03-09 VITALS — BP 158/108 | HR 88 | Temp 98.4°F | Ht 68.0 in | Wt 219.4 lb

## 2013-03-09 DIAGNOSIS — E785 Hyperlipidemia, unspecified: Secondary | ICD-10-CM

## 2013-03-09 DIAGNOSIS — R000 Tachycardia, unspecified: Secondary | ICD-10-CM

## 2013-03-09 DIAGNOSIS — I10 Essential (primary) hypertension: Secondary | ICD-10-CM

## 2013-03-09 DIAGNOSIS — IMO0001 Reserved for inherently not codable concepts without codable children: Secondary | ICD-10-CM

## 2013-03-09 NOTE — Progress Notes (Signed)
   Subjective:    Patient ID: [name] [name], male    DOB: 1950-11-04, 62 y.o.   MRN: [id number]  HPI Patient is here today for basically the same symptoms he has on 11/17.   He is still having sweats that come and go.   He started back on the Etodolac about a week ago since he was still having the sweats.   His blood pressure reading from 12/17 was 140/60 heart rate was 80.  On 12/16 his heart rate was 100. Patient states his heart was running fast he might of been stressed. PMH benign    Review of Systems Denies chest tightness pressure pain denies nausea vomiting diarrhea    Objective:   Physical Exam Lungs are clear hearts regular pulse normal blood pressure is elevated on recheck 144/92       Assessment & Plan:  #1 patient is going to go ahead and stop Celexa hopefully this will help his sweats go away. To some degree his sweats are associated with stress at work.  #2 his blood pressure readings at home are good he does have office hypertension he will watch and readings closely and he will forward the readings to [place] periodically yard he has an appointment in January

## 2013-03-09 NOTE — Telephone Encounter (Signed)
Patient made appointment for this morning

## 2013-03-19 LAB — BASIC METABOLIC PANEL
BUN: 16 mg/dL (ref 6–23)
Calcium: 8.8 mg/dL (ref 8.4–10.5)
Chloride: 102 mEq/L (ref 96–112)
Potassium: 5 mEq/L (ref 3.5–5.3)
Sodium: 140 mEq/L (ref 135–145)

## 2013-03-19 LAB — LIPID PANEL
Cholesterol: 193 mg/dL (ref 0–200)
Triglycerides: 152 mg/dL — ABNORMAL HIGH (ref <150)
VLDL: 30 mg/dL (ref 0–40)

## 2013-03-19 LAB — HEMOGLOBIN A1C
Hgb A1c MFr Bld: 5.9 % — ABNORMAL HIGH (ref <5.7)
Mean Plasma Glucose: 123 mg/dL — ABNORMAL HIGH (ref <117)

## 2013-03-20 LAB — MICROALBUMIN, URINE: Microalb, Ur: 4.91 mg/dL — ABNORMAL HIGH (ref 0.00–1.89)

## 2013-03-26 ENCOUNTER — Ambulatory Visit (INDEPENDENT_AMBULATORY_CARE_PROVIDER_SITE_OTHER): Payer: Managed Care, Other (non HMO) | Admitting: Family Medicine

## 2013-03-26 ENCOUNTER — Encounter: Payer: Self-pay | Admitting: Family Medicine

## 2013-03-26 VITALS — BP 132/88 | Ht 68.0 in | Wt 214.0 lb

## 2013-03-26 DIAGNOSIS — R809 Proteinuria, unspecified: Secondary | ICD-10-CM | POA: Insufficient documentation

## 2013-03-26 DIAGNOSIS — M109 Gout, unspecified: Secondary | ICD-10-CM

## 2013-03-26 DIAGNOSIS — R7309 Other abnormal glucose: Secondary | ICD-10-CM

## 2013-03-26 DIAGNOSIS — R7303 Prediabetes: Secondary | ICD-10-CM | POA: Insufficient documentation

## 2013-03-26 DIAGNOSIS — E785 Hyperlipidemia, unspecified: Secondary | ICD-10-CM

## 2013-03-26 DIAGNOSIS — R61 Generalized hyperhidrosis: Secondary | ICD-10-CM | POA: Insufficient documentation

## 2013-03-26 MED ORDER — LISINOPRIL 2.5 MG PO TABS: 2.5000 mg | ORAL_TABLET | Freq: Every day | ORAL | Status: DC

## 2013-03-26 NOTE — Progress Notes (Signed)
   Subjective:    Patient ID: [name] [name], male    DOB: April 17, 1950, 63 y.o.   MRN: [id number]  HPIFollow up on bloodwork. No concerns.  20 minutes spent with patient discussing his lab work. He has had some night sweats. No vomiting no diarrhea Appetite being good. Review of Systems     Objective:   Physical Exam  Lungs clear hearts regular pulse normal BP good on recheck      Assessment & Plan:  #1 hyperlipidemia tolerating medicine well watch diet closely recheck lab work in within 6 months #2 patient having night sweats I recommend CBC hepatitis C antibody as well as HIV antibody. Also chest x-ray. Persistent night sweats may need referral to oncology for workup regarding potential occult cancer but hopefully I don't feel the patient has that currently #3 prediabetes fair control may need to be on medication at some point if this progressively gets worse

## 2013-03-27 LAB — CBC WITH DIFFERENTIAL/PLATELET
BASOS ABS: 0.1 10*3/uL (ref 0.0–0.1)
Basophils Relative: 1 % (ref 0–1)
Eosinophils Absolute: 0.1 10*3/uL (ref 0.0–0.7)
Eosinophils Relative: 2 % (ref 0–5)
HCT: 46 % (ref 39.0–52.0)
HEMOGLOBIN: 15.7 g/dL (ref 13.0–17.0)
LYMPHS PCT: 28 % (ref 12–46)
Lymphs Abs: 1.4 10*3/uL (ref 0.7–4.0)
MCH: 29.7 pg (ref 26.0–34.0)
MCHC: 34.1 g/dL (ref 30.0–36.0)
MCV: 87 fL (ref 78.0–100.0)
MONOS PCT: 15 % — AB (ref 3–12)
Monocytes Absolute: 0.8 10*3/uL (ref 0.1–1.0)
NEUTROS ABS: 2.7 10*3/uL (ref 1.7–7.7)
Neutrophils Relative %: 54 % (ref 43–77)
Platelets: 213 10*3/uL (ref 150–400)
RBC: 5.29 MIL/uL (ref 4.22–5.81)
RDW: 13.7 % (ref 11.5–15.5)
WBC: 5 10*3/uL (ref 4.0–10.5)

## 2013-03-28 ENCOUNTER — Encounter: Payer: Self-pay | Admitting: Family Medicine

## 2013-03-28 LAB — HIV ANTIBODY (ROUTINE TESTING W REFLEX): HIV: NONREACTIVE

## 2013-03-28 LAB — HEPATITIS C ANTIBODY: HCV AB: NEGATIVE

## 2013-03-28 LAB — URIC ACID: Uric Acid, Serum: 4.4 mg/dL (ref 4.0–7.8)

## 2013-03-29 ENCOUNTER — Ambulatory Visit (HOSPITAL_COMMUNITY)
Admission: RE | Admit: 2013-03-29 | Discharge: 2013-03-29 | Disposition: A | Discharge status: Home / Self Care | Payer: Managed Care, Other (non HMO) | Source: Ambulatory Visit | Attending: Family Medicine | Admitting: Family Medicine

## 2013-03-29 ENCOUNTER — Ambulatory Visit (INDEPENDENT_AMBULATORY_CARE_PROVIDER_SITE_OTHER): Payer: Managed Care, Other (non HMO) | Admitting: Orthopedic Surgery

## 2013-03-29 VITALS — BP 164/91 | Ht 68.0 in | Wt 214.0 lb

## 2013-03-29 DIAGNOSIS — M1611 Unilateral primary osteoarthritis, right hip: Secondary | ICD-10-CM

## 2013-03-29 DIAGNOSIS — M1610 Unilateral primary osteoarthritis, unspecified hip: Secondary | ICD-10-CM

## 2013-03-29 DIAGNOSIS — R61 Generalized hyperhidrosis: Secondary | ICD-10-CM | POA: Insufficient documentation

## 2013-03-29 DIAGNOSIS — M169 Osteoarthritis of hip, unspecified: Secondary | ICD-10-CM

## 2013-03-29 DIAGNOSIS — J4489 Other specified chronic obstructive pulmonary disease: Secondary | ICD-10-CM | POA: Insufficient documentation

## 2013-03-29 DIAGNOSIS — J449 Chronic obstructive pulmonary disease, unspecified: Secondary | ICD-10-CM | POA: Insufficient documentation

## 2013-03-29 MED ORDER — GABAPENTIN 100 MG PO CAPS: 100.0000 mg | ORAL_CAPSULE | Freq: Three times a day (TID) | ORAL | Status: DC

## 2013-03-29 MED ORDER — ACETAMINOPHEN-CODEINE #3 300-30 MG PO TABS: 1.0000 | ORAL_TABLET | ORAL | Status: DC | PRN

## 2013-03-29 NOTE — Patient Instructions (Addendum)
You have been scheduled for total hip arthroplasty please stop all blood thinners one week prior to surgery  The risks and benefits of the procedure include but are not limited to wound infection, postoperative bleeding, operative the vein thrombosis or blood clot. Pulmonary embolus which can be fatal. Dislocation of the hip. Pain in the hip. Early hip failure due to excessive wear. The average time for recovery is 3-5 months.   Total Hip Replacement Total hip replacement is the replacement of your damaged hip with an artificial hip joint (prosthetic hip joint). The purpose of this surgery is to reduce pain and improve your hip function. LET YOUR CAREGIVER KNOW ABOUT:   Any allergies you have.  Any medicines you are taking, including vitamins, herbs, eyedrops, over-the-counter medicines, and creams.  Any problems you have had with the use of anesthetics.  Family history of problems with the use of anesthetics.  Any blood disorders you have, including bleeding problems or clotting problems.  Previous surgeries you have had. RISKS AND COMPLICATIONS Generally, total hip replacement is a safe procedure. However, as with any surgical procedure, complications can occur. Complications associated with total hip replacement both during and after the procedure include:  Infection.  Dislocation (the ball of the hip-joint prosthesis comes out of contact with the socket).  Loosening of the stem connected to the ball or socket.  Fracture of the bone while inserting the prosthesis.  Formation of blood clots, which can break loose and travel to and injure your lungs (pulmonary embolus). BEFORE THE PROCEDURE   Your caregiver will instruct you when you need to stop eating and drinking.  Ask your caregiver if you need to change or stop any regular medicines. PROCEDURE Just before the procedure, you will receive medicine that will make you drowsy (sedative) or medicine to make you fall asleep  (general anesthetic). This will be given through a tube that is inserted into one of your veins (intravenous [IV] tube). Then you will receive medicine to block pain from the waist down through your legs (spinal block). An incision is made in your hip. Your surgeon will take out any damaged cartilage and bone. Next, your surgeon will insert a prosthetic socket into your pelvic bone. This is usually secured with screws. Then, your surgeon will cut off the ball of your thigh bone (femur) and attach a prosthetic ball on a stem to your femur. The surgeon then places the ball into the socket and checks the range of motion of your new hip. AFTER THE PROCEDURE  You will be taken to the recovery area where a nurse will watch and check your progress. Once you are awake and stable, you will be taken to a hospital room. You will receive physical therapy until you are doing well and your caregiver feels it is safe for you to go home. Typically, you will stay in the hospital 1 4 days after your procedure. Document Released: 06/14/2000 Document Revised: 09/07/2011 Document Reviewed: 04/25/2011 [facility] Patient Information 2014 [place]. Total Hip Replacement Care After Refer to this sheet in the next few weeks. These instructions provide you with information on caring for yourself after your procedure. Your caregiver also may give you specific instructions. Your treatment has been planned according to the most current medical practices, but problems sometimes occur. Call your caregiver if you have any problems or questions after your procedure. HOME CARE INSTRUCTIONS  Your caregiver will give you specific precautions for certain types of movement. Additional instructions include:  Take over-the-counter or prescription medicines for pain, discomfort, or fever only as directed by your caregiver.  Take quick showers (3 5 min) rather than bathe until your caregiver tells you that you can take baths  again.  Avoid lifting until your caregiver instructs you otherwise.  Use a raised toilet seat and avoid sitting in low chairs as instructed by your caregiver.  Use crutches or a walker as instructed by your caregiver. SEEK MEDICAL CARE IF:  You have difficulty breathing.  Your wound is red, swollen, or has become increasingly painful.  You have pus draining from your wound.  You have a bad smell coming from your wound.  You have persistent bleeding from your wound.  Your wound breaks open after sutures (stitches) or staples have been removed. SEEK IMMEDIATE MEDICAL CARE IF:   You have a fever.  You have a rash.  You have pain or swelling in your calf or thigh.  You have shortness of breath or chest pain. MAKE SURE YOU:  Understand these instructions.  Will watch your condition.  Will get help right away if you are not doing well or get worse. Document Released: 09/25/2004 Document Revised: 09/07/2011 Document Reviewed: 04/25/2011 [facility] Patient Information 2014 [place].

## 2013-03-29 NOTE — Progress Notes (Signed)
Patient ID: [name] [name], male   DOB: [birth year], 63 y.o.   MRN: [id number] Chief Complaint   Patient presents with   .  Pain       Pain in right knee,hip and lower back     63 year old male with right knee and right hip pain along with back pain presents with complaints of dull throbbing pain in his lower back radiating down his right leg. He denies anterior thigh pain or groin pain and complains of 7/10 intensity with dull throbbing sensation which is worse with sitting. He does notice it is hard for him to do things such as bending over nail care and he is more worried about his back and his hip. He can't cross his legs.  Review of systems weight gain and fatigue unsteady gait easy bleeding easy bruising all other systems were reviewed and were negative  Has no known drug allergies. He listed no major medical problems but he takes allopurinol for gout and he has high cholesterol with pravastatin 80 mg to treat that. He had prostate cancer in 2013 he had surgery for that and then he had a right herniorrhaphy.   The patient comes in for an unscheduled visit for increasing pain in his lower back right hip right groin area right anterior thigh  He says he can't function any longer. We discussed possible treatment options he would like to proceed with surgical treatment. 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 he drinks 2 beers a day or 12 ounce glass of wine   BP 164/91  Ht 5\' 8"  (1.727 m)  Wt 214 lb (97.07 kg)  BMI 32.55 kg/m2 Yes tenderness in his lumbar spine pain in his right buttock and then he has tenderness and painful range of motion in the right groin and anterior thigh his hip is stable his motor exam is normal scans intact is a good pulse and normal sensation the right leg no lymphadenopathy gait is marked for an external rotation alignment to the right lower extremity mood is normal is oriented x3 appearance is normal vital signs are stable  Right hip osteoarthritis Spinal stenosis and lumbar degenerative disc disease  Right total hip arthroplasty

## 2013-03-30 ENCOUNTER — Other Ambulatory Visit: Payer: Self-pay | Admitting: *Deleted

## 2013-03-30 MED ORDER — BUPIVACAINE LIPOSOME 1.3 % IJ SUSP: 20.0000 mL | Freq: Once | INTRAMUSCULAR | Status: DC

## 2013-04-05 ENCOUNTER — Telehealth: Payer: Self-pay | Admitting: Orthopedic Surgery

## 2013-04-05 NOTE — Telephone Encounter (Signed)
Regarding in-patient/admit surgery scheduled at [facility] 2013/05/13, CPT 27130, ICD-9 codes 715.95, 715.15, spoke with intake representative Stefanie O.  Received approval for in-patient stay X 3-days, Starting 2013/05/13 to 04/23/13. Pre-Authorization [id number].

## 2013-04-06 ENCOUNTER — Encounter: Payer: Self-pay | Admitting: Orthopedic Surgery

## 2013-04-09 ENCOUNTER — Encounter (HOSPITAL_COMMUNITY): Payer: Self-pay | Admitting: Pharmacy Technician

## 2013-04-09 ENCOUNTER — Encounter: Payer: Self-pay | Admitting: Orthopedic Surgery

## 2013-04-16 ENCOUNTER — Encounter (HOSPITAL_COMMUNITY)
Admission: RE | Admit: 2013-04-16 | Discharge: 2013-04-16 | Disposition: A | Discharge status: Home / Self Care | Payer: Managed Care, Other (non HMO) | Source: Ambulatory Visit | Attending: Orthopedic Surgery | Admitting: Orthopedic Surgery

## 2013-04-16 ENCOUNTER — Encounter (HOSPITAL_COMMUNITY): Payer: Self-pay

## 2013-04-16 DIAGNOSIS — Z01818 Encounter for other preprocedural examination: Secondary | ICD-10-CM | POA: Insufficient documentation

## 2013-04-16 DIAGNOSIS — Z01812 Encounter for preprocedural laboratory examination: Secondary | ICD-10-CM | POA: Insufficient documentation

## 2013-04-16 DIAGNOSIS — Z01810 Encounter for preprocedural cardiovascular examination: Secondary | ICD-10-CM | POA: Insufficient documentation

## 2013-04-16 HISTORY — DX: Pure hypercholesterolemia, unspecified: E78.00

## 2013-04-16 HISTORY — DX: Type 2 diabetes mellitus without complications: E11.9

## 2013-04-16 HISTORY — DX: Essential (primary) hypertension: I10

## 2013-04-16 LAB — CBC
HCT: 48.9 % (ref 39.0–52.0)
Hemoglobin: 16.5 g/dL (ref 13.0–17.0)
MCH: 30.3 pg (ref 26.0–34.0)
MCHC: 33.7 g/dL (ref 30.0–36.0)
MCV: 89.7 fL (ref 78.0–100.0)
PLATELETS: 235 10*3/uL (ref 150–400)
RBC: 5.45 MIL/uL (ref 4.22–5.81)
RDW: 12.8 % (ref 11.5–15.5)
WBC: 9 10*3/uL (ref 4.0–10.5)

## 2013-04-16 LAB — BASIC METABOLIC PANEL
BUN: 20 mg/dL (ref 6–23)
CHLORIDE: 100 meq/L (ref 96–112)
CO2: 29 meq/L (ref 19–32)
CREATININE: 1.11 mg/dL (ref 0.50–1.35)
Calcium: 9.2 mg/dL (ref 8.4–10.5)
GFR calc Af Amer: 79 mL/min — ABNORMAL LOW (ref >90)
GFR calc non Af Amer: 68 mL/min — ABNORMAL LOW (ref >90)
GLUCOSE: 90 mg/dL (ref 70–99)
Potassium: 4.6 mEq/L (ref 3.7–5.3)
Sodium: 142 mEq/L (ref 137–147)

## 2013-04-16 LAB — ABO/RH: ABO/RH(D): O POS

## 2013-04-16 LAB — SURGICAL PCR SCREEN
MRSA, PCR: NEGATIVE
Staphylococcus aureus: NEGATIVE

## 2013-04-16 LAB — PREPARE RBC (CROSSMATCH)

## 2013-04-16 NOTE — Patient Instructions (Signed)
[name] [name]  04/16/2013   Your procedure is scheduled on:  04/20/2013  Report to [facility] at  33  AM.  Call this number if you have problems the morning of surgery: [phone]   Remember:   Do not eat food or drink liquids after midnight.   Take these medicines the morning of surgery with A SIP OF WATER: citalopram, lisinopril, gabapentin,tylenol; #3 if needed.  Do not wear jewelry, make-up or nail polish.  Do not wear lotions, powders, or perfume.  Do not shave 48 hours prior to surgery. Men may shave face and neck.  Do not bring valuables to the hospital.  [facility] is not responsible for any belongings or valuables.               Contacts, dentures or bridgework may not be worn into surgery.  Leave suitcase in the car. After surgery it may be brought to your room.  For patients admitted to the hospital, discharge time is determined by your treatment team.               Patients discharged the day of surgery will not be allowed to drive home.  Name and phone number of your driver: family  Special Instructions: Shower using CHG 2 nights before surgery and the night before surgery.  If you shower the day of surgery use CHG.  Use special wash - you have one bottle of CHG for all showers.  You should use approximately 1/3 of the bottle for each shower.   Please read over the following fact sheets that you were given: Pain Booklet, Coughing and Deep Breathing, Blood Transfusion Information, Lab Information, Total Joint Packet, MRSA Information, Surgical Site Infection Prevention, Anesthesia Post-op Instructions and Care and Recovery After Surgery Total Hip Replacement Total hip replacement is the replacement of your damaged hip with an artificial hip joint (prosthetic hip joint). The purpose of this surgery is to reduce pain and improve your hip function. LET YOUR CAREGIVER KNOW ABOUT:   Any allergies you have.  Any medicines you are taking, including vitamins, herbs,  eyedrops, over-the-counter medicines, and creams.  Any problems you have had with the use of anesthetics.  Family history of problems with the use of anesthetics.  Any blood disorders you have, including bleeding problems or clotting problems.  Previous surgeries you have had. RISKS AND COMPLICATIONS Generally, total hip replacement is a safe procedure. However, as with any surgical procedure, complications can occur. Complications associated with total hip replacement both during and after the procedure include:  Infection.  Dislocation (the ball of the hip-joint prosthesis comes out of contact with the socket).  Loosening of the stem connected to the ball or socket.  Fracture of the bone while inserting the prosthesis.  Formation of blood clots, which can break loose and travel to and injure your lungs (pulmonary embolus). BEFORE THE PROCEDURE   Your caregiver will instruct you when you need to stop eating and drinking.  Ask your caregiver if you need to change or stop any regular medicines. PROCEDURE Just before the procedure, you will receive medicine that will make you drowsy (sedative) or medicine to make you fall asleep (general anesthetic). This will be given through a tube that is inserted into one of your veins (intravenous [IV] tube). Then you will receive medicine to block pain from the waist down through your legs (spinal block). An incision is made in your hip. Your surgeon will take out any  damaged cartilage and bone. Next, your surgeon will insert a prosthetic socket into your pelvic bone. This is usually secured with screws. Then, your surgeon will cut off the ball of your thigh bone (femur) and attach a prosthetic ball on a stem to your femur. The surgeon then places the ball into the socket and checks the range of motion of your new hip. AFTER THE PROCEDURE  You will be taken to the recovery area where a nurse will watch and check your progress. Once you are awake and  stable, you will be taken to a hospital room. You will receive physical therapy until you are doing well and your caregiver feels it is safe for you to go home. Typically, you will stay in the hospital 1 4 days after your procedure. Document Released: 06/14/2000 Document Revised: 09/07/2011 Document Reviewed: 04/25/2011 [facility] Patient Information 2014 [place]. PATIENT INSTRUCTIONS POST-ANESTHESIA  IMMEDIATELY FOLLOWING SURGERY:  Do not drive or operate machinery for the first twenty four hours after surgery.  Do not make any important decisions for twenty four hours after surgery or while taking narcotic pain medications or sedatives.  If you develop intractable nausea and vomiting or a severe headache please notify your doctor immediately.  FOLLOW-UP:  Please make an appointment with your surgeon as instructed. You do not need to follow up with anesthesia unless specifically instructed to do so.  WOUND CARE INSTRUCTIONS (if applicable):  Keep a dry clean dressing on the anesthesia/puncture wound site if there is drainage.  Once the wound has quit draining you may leave it open to air.  Generally you should leave the bandage intact for twenty four hours unless there is drainage.  If the epidural site drains for more than 36-48 hours please call the anesthesia department.  QUESTIONS?:  Please feel free to call your physician or the hospital operator if you have any questions, and they will be happy to assist you.

## 2013-04-19 ENCOUNTER — Other Ambulatory Visit: Payer: Self-pay | Admitting: *Deleted

## 2013-04-19 MED ORDER — BUPIVACAINE LIPOSOME 1.3 % IJ SUSP
20.0000 mL | Freq: Once | INTRAMUSCULAR | Status: DC
  Filled 2013-04-19: qty 20

## 2013-04-19 NOTE — H&P (Signed)
Patient ID: [name] [name], male DOB: [birth year], 63 y.o. MRN: [id number]    Chief Complaint    Patient presents with    .  Pain      Pain in right knee,hip and lower back    63 year old male with right knee and right hip pain along with back pain presents with complaints of dull throbbing pain in his lower back radiating down his right leg. He denies anterior thigh pain or groin pain and complains of 7/10 intensity with dull throbbing sensation which is worse with sitting. He does notice it is hard for him to do things such as bending over nail care and he is more worried about his back and his hip. He can't cross his legs.    Review of systems weight gain and fatigue unsteady gait easy bleeding easy bruising all other systems were reviewed and were negative    Has no known drug allergies.   He listed no major medical problems but he takes allopurinol for gout and he has high cholesterol with pravastatin 80 mg to treat that.   He had prostate cancer in 2013 he had surgery for that and then he had a right herniorrhaphy.   The patient comes in for an unscheduled visit for increasing pain in his lower back right hip right groin area right anterior thigh  He says he can't function any longer. We discussed possible treatment options he would like to proceed with surgical treatment. 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   Range of motion is full without contracture.  Motor exam is normal with grade 5 strength.  The joints are fully reduced without subluxation.  There is no atrophy or tremor and muscle tone is normal.  All joints are stable.  He has  tenderness in his lumbar spine pain in his right buttock and then he has tenderness and painful range of motion in the right groin and anterior thigh his hip is stable his motor exam is normal scans intact is a good pulse and normal sensation the right leg no lymphadenopathy gait is marked for an external rotation alignment to the right lower extremity mood is normal is oriented x3 appearance is normal vital signs are stable    Right hip osteoarthritis   Spinal stenosis  and lumbar degenerative disc disease   Right total hip arthroplasty

## 2013-04-20 ENCOUNTER — Encounter (HOSPITAL_COMMUNITY): Payer: Managed Care, Other (non HMO) | Admitting: Anesthesiology

## 2013-04-20 ENCOUNTER — Encounter (HOSPITAL_COMMUNITY): Payer: Self-pay | Admitting: *Deleted

## 2013-04-20 ENCOUNTER — Ambulatory Visit (HOSPITAL_COMMUNITY): Payer: Managed Care, Other (non HMO)

## 2013-04-20 ENCOUNTER — Ambulatory Visit (HOSPITAL_COMMUNITY): Payer: Managed Care, Other (non HMO) | Admitting: Anesthesiology

## 2013-04-20 ENCOUNTER — Encounter (HOSPITAL_COMMUNITY)
Admission: RE | Disposition: A | Discharge status: Home Health | Payer: Self-pay | Source: Ambulatory Visit | Attending: Orthopedic Surgery

## 2013-04-20 ENCOUNTER — Inpatient Hospital Stay (HOSPITAL_COMMUNITY)
Admission: RE | Admit: 2013-04-20 | Discharge: 2013-04-23 | DRG: 470 | Disposition: A | Discharge status: Home Health | Payer: Managed Care, Other (non HMO) | Source: Ambulatory Visit | Attending: Orthopedic Surgery | Admitting: Orthopedic Surgery

## 2013-04-20 DIAGNOSIS — M5137 Other intervertebral disc degeneration, lumbosacral region: Secondary | ICD-10-CM | POA: Diagnosis present

## 2013-04-20 DIAGNOSIS — Z8249 Family history of ischemic heart disease and other diseases of the circulatory system: Secondary | ICD-10-CM

## 2013-04-20 DIAGNOSIS — Z833 Family history of diabetes mellitus: Secondary | ICD-10-CM

## 2013-04-20 DIAGNOSIS — M51379 Other intervertebral disc degeneration, lumbosacral region without mention of lumbar back pain or lower extremity pain: Secondary | ICD-10-CM | POA: Diagnosis present

## 2013-04-20 DIAGNOSIS — I10 Essential (primary) hypertension: Secondary | ICD-10-CM | POA: Diagnosis present

## 2013-04-20 DIAGNOSIS — Z8546 Personal history of malignant neoplasm of prostate: Secondary | ICD-10-CM

## 2013-04-20 DIAGNOSIS — M1610 Unilateral primary osteoarthritis, unspecified hip: Principal | ICD-10-CM | POA: Diagnosis present

## 2013-04-20 DIAGNOSIS — M109 Gout, unspecified: Secondary | ICD-10-CM | POA: Diagnosis present

## 2013-04-20 DIAGNOSIS — M1611 Unilateral primary osteoarthritis, right hip: Secondary | ICD-10-CM | POA: Diagnosis present

## 2013-04-20 DIAGNOSIS — E7800 Pure hypercholesterolemia, unspecified: Secondary | ICD-10-CM | POA: Diagnosis present

## 2013-04-20 DIAGNOSIS — M169 Osteoarthritis of hip, unspecified: Secondary | ICD-10-CM

## 2013-04-20 HISTORY — PX: TOTAL HIP ARTHROPLASTY: SHX124

## 2013-04-20 LAB — GLUCOSE, CAPILLARY: GLUCOSE-CAPILLARY: 112 mg/dL — AB (ref 70–99)

## 2013-04-20 SURGERY — ARTHROPLASTY, HIP, TOTAL,POSTERIOR APPROACH
Anesthesia: Spinal | Site: Hip | Laterality: Right

## 2013-04-20 MED ORDER — ONDANSETRON HCL 4 MG/2ML IJ SOLN
4.0000 mg | Freq: Once | INTRAMUSCULAR | Status: AC
  Administered 2013-04-20: 4 mg via INTRAVENOUS

## 2013-04-20 MED ORDER — LIDOCAINE HCL (CARDIAC) 10 MG/ML IV SOLN
INTRAVENOUS | Status: DC | PRN
  Administered 2013-04-20: 50 mg via INTRAVENOUS

## 2013-04-20 MED ORDER — EPHEDRINE SULFATE 50 MG/ML IJ SOLN
INTRAMUSCULAR | Status: DC | PRN
  Administered 2013-04-20 (×3): 5 mg via INTRAVENOUS

## 2013-04-20 MED ORDER — CEFAZOLIN SODIUM 1-5 GM-% IV SOLN
1.0000 g | Freq: Four times a day (QID) | INTRAVENOUS | Status: AC
  Administered 2013-04-20 (×2): 1 g via INTRAVENOUS
  Filled 2013-04-20 (×2): qty 50

## 2013-04-20 MED ORDER — SODIUM CHLORIDE 0.9 % IJ SOLN
INTRAMUSCULAR | Status: DC | PRN
  Administered 2013-04-20: 10:00:00

## 2013-04-20 MED ORDER — LACTATED RINGERS IV SOLN
INTRAVENOUS | Status: DC
  Administered 2013-04-20: 07:00:00 via INTRAVENOUS

## 2013-04-20 MED ORDER — ONDANSETRON HCL 4 MG/2ML IJ SOLN
4.0000 mg | Freq: Four times a day (QID) | INTRAMUSCULAR | Status: DC | PRN
  Administered 2013-04-20: 4 mg via INTRAVENOUS
  Filled 2013-04-20: qty 2

## 2013-04-20 MED ORDER — SUCCINYLCHOLINE CHLORIDE 20 MG/ML IJ SOLN
INTRAMUSCULAR | Status: AC
  Filled 2013-04-20: qty 1

## 2013-04-20 MED ORDER — SENNA 8.6 MG PO TABS
1.0000 | ORAL_TABLET | Freq: Two times a day (BID) | ORAL | Status: DC
  Administered 2013-04-20 – 2013-04-23 (×7): 8.6 mg via ORAL
  Filled 2013-04-20 (×7): qty 1

## 2013-04-20 MED ORDER — METOCLOPRAMIDE HCL 10 MG PO TABS: 5.0000 mg | ORAL_TABLET | Freq: Three times a day (TID) | ORAL | Status: DC | PRN

## 2013-04-20 MED ORDER — BUPIVACAINE-EPINEPHRINE PF 0.5-1:200000 % IJ SOLN
INTRAMUSCULAR | Status: AC
  Filled 2013-04-20: qty 10

## 2013-04-20 MED ORDER — BISACODYL 10 MG RE SUPP: 10.0000 mg | Freq: Every day | RECTAL | Status: DC | PRN

## 2013-04-20 MED ORDER — PREGABALIN 50 MG PO CAPS
ORAL_CAPSULE | ORAL | Status: AC
  Filled 2013-04-20: qty 1

## 2013-04-20 MED ORDER — ASPIRIN EC 325 MG PO TBEC
325.0000 mg | DELAYED_RELEASE_TABLET | Freq: Every day | ORAL | Status: DC
  Administered 2013-04-21 – 2013-04-23 (×3): 325 mg via ORAL
  Filled 2013-04-20 (×3): qty 1

## 2013-04-20 MED ORDER — GLYCOPYRROLATE 0.2 MG/ML IJ SOLN
0.2000 mg | Freq: Once | INTRAMUSCULAR | Status: AC
  Administered 2013-04-20: 0.2 mg via INTRAVENOUS

## 2013-04-20 MED ORDER — TESTOSTERONE 30 MG/ACT TD SOLN: 1.0000 "application " | Freq: Every day | TRANSDERMAL | Status: DC

## 2013-04-20 MED ORDER — POLYETHYLENE GLYCOL 3350 17 G PO PACK: 17.0000 g | PACK | Freq: Every day | ORAL | Status: DC | PRN

## 2013-04-20 MED ORDER — OXYCODONE HCL 5 MG PO TABS
5.0000 mg | ORAL_TABLET | Freq: Once | ORAL | Status: AC
  Administered 2013-04-20: 5 mg via ORAL

## 2013-04-20 MED ORDER — STERILE WATER FOR IRRIGATION IR SOLN
Status: DC | PRN
  Administered 2013-04-20 (×2): 1000 mL

## 2013-04-20 MED ORDER — OXYCODONE HCL 5 MG PO TABS: 5.0000 mg | ORAL_TABLET | ORAL | Status: DC | PRN

## 2013-04-20 MED ORDER — PROPOFOL 10 MG/ML IV BOLUS
INTRAVENOUS | Status: AC
  Filled 2013-04-20: qty 20

## 2013-04-20 MED ORDER — ACETAMINOPHEN 325 MG PO TABS: 650.0000 mg | ORAL_TABLET | Freq: Four times a day (QID) | ORAL | Status: DC | PRN

## 2013-04-20 MED ORDER — OXYCODONE HCL 5 MG PO TABS
ORAL_TABLET | ORAL | Status: AC
  Filled 2013-04-20: qty 1

## 2013-04-20 MED ORDER — SIMVASTATIN 20 MG PO TABS
40.0000 mg | ORAL_TABLET | Freq: Every day | ORAL | Status: DC
  Administered 2013-04-20 – 2013-04-23 (×4): 40 mg via ORAL
  Filled 2013-04-20 (×4): qty 2

## 2013-04-20 MED ORDER — LISINOPRIL 5 MG PO TABS
2.5000 mg | ORAL_TABLET | Freq: Every day | ORAL | Status: DC
  Administered 2013-04-20 – 2013-04-21 (×2): 2.5 mg via ORAL
  Filled 2013-04-20 (×2): qty 1

## 2013-04-20 MED ORDER — ACETAMINOPHEN-CODEINE #3 300-30 MG PO TABS: 1.0000 | ORAL_TABLET | ORAL | Status: DC | PRN

## 2013-04-20 MED ORDER — LIDOCAINE HCL (PF) 1 % IJ SOLN
INTRAMUSCULAR | Status: AC
  Filled 2013-04-20: qty 5

## 2013-04-20 MED ORDER — GABAPENTIN 100 MG PO CAPS
100.0000 mg | ORAL_CAPSULE | Freq: Three times a day (TID) | ORAL | Status: DC
  Administered 2013-04-20 – 2013-04-23 (×10): 100 mg via ORAL
  Filled 2013-04-20 (×11): qty 1

## 2013-04-20 MED ORDER — EPHEDRINE SULFATE 50 MG/ML IJ SOLN
INTRAMUSCULAR | Status: AC
  Filled 2013-04-20: qty 1

## 2013-04-20 MED ORDER — SODIUM CHLORIDE 0.9 % IR SOLN
Status: DC | PRN
  Administered 2013-04-20: 3000 mL
  Administered 2013-04-20: 1000 mL

## 2013-04-20 MED ORDER — VITAMIN E 180 MG (400 UNIT) PO CAPS
400.0000 [IU] | ORAL_CAPSULE | Freq: Every day | ORAL | Status: DC
  Administered 2013-04-20 – 2013-04-23 (×4): 400 [IU] via ORAL
  Filled 2013-04-20 (×6): qty 1

## 2013-04-20 MED ORDER — FENTANYL CITRATE 0.05 MG/ML IJ SOLN
INTRAMUSCULAR | Status: AC
  Filled 2013-04-20: qty 2

## 2013-04-20 MED ORDER — CELECOXIB 400 MG PO CAPS
400.0000 mg | ORAL_CAPSULE | Freq: Once | ORAL | Status: AC
  Administered 2013-04-20: 400 mg via ORAL

## 2013-04-20 MED ORDER — BUPIVACAINE-EPINEPHRINE PF 0.5-1:200000 % IJ SOLN
INTRAMUSCULAR | Status: DC | PRN
  Administered 2013-04-20: 30 mL

## 2013-04-20 MED ORDER — FENTANYL CITRATE 0.05 MG/ML IJ SOLN
INTRAMUSCULAR | Status: DC | PRN
  Administered 2013-04-20: 25 ug via INTRATHECAL
  Administered 2013-04-20: 25 ug via INTRAVENOUS

## 2013-04-20 MED ORDER — MIDAZOLAM HCL 2 MG/2ML IJ SOLN
INTRAMUSCULAR | Status: AC
  Filled 2013-04-20: qty 2

## 2013-04-20 MED ORDER — FENTANYL CITRATE 0.05 MG/ML IJ SOLN: 25.0000 ug | INTRAMUSCULAR | Status: DC | PRN

## 2013-04-20 MED ORDER — BUPIVACAINE LIPOSOME 1.3 % IJ SUSP
20.0000 mL | Freq: Once | INTRAMUSCULAR | Status: DC
  Filled 2013-04-20: qty 20

## 2013-04-20 MED ORDER — HYDROMORPHONE HCL PF 1 MG/ML IJ SOLN: 0.5000 mg | INTRAMUSCULAR | Status: DC | PRN

## 2013-04-20 MED ORDER — METHOCARBAMOL 100 MG/ML IJ SOLN
500.0000 mg | Freq: Four times a day (QID) | INTRAMUSCULAR | Status: DC | PRN
  Filled 2013-04-20: qty 5

## 2013-04-20 MED ORDER — METHOCARBAMOL 500 MG PO TABS: 500.0000 mg | ORAL_TABLET | Freq: Four times a day (QID) | ORAL | Status: DC | PRN

## 2013-04-20 MED ORDER — BUPIVACAINE IN DEXTROSE 0.75-8.25 % IT SOLN
INTRATHECAL | Status: AC
  Filled 2013-04-20: qty 2

## 2013-04-20 MED ORDER — HYDROCODONE-ACETAMINOPHEN 7.5-325 MG PO TABS
1.0000 | ORAL_TABLET | ORAL | Status: DC
  Administered 2013-04-20 – 2013-04-22 (×11): 1 via ORAL
  Filled 2013-04-20: qty 2
  Filled 2013-04-20 (×12): qty 1

## 2013-04-20 MED ORDER — CEFAZOLIN SODIUM-DEXTROSE 2-3 GM-% IV SOLR
2.0000 g | INTRAVENOUS | Status: AC
  Administered 2013-04-20: 2 g via INTRAVENOUS
  Filled 2013-04-20: qty 50

## 2013-04-20 MED ORDER — GLYCOPYRROLATE 0.2 MG/ML IJ SOLN
INTRAMUSCULAR | Status: AC
  Filled 2013-04-20: qty 1

## 2013-04-20 MED ORDER — ONDANSETRON HCL 4 MG PO TABS: 4.0000 mg | ORAL_TABLET | Freq: Four times a day (QID) | ORAL | Status: DC | PRN

## 2013-04-20 MED ORDER — ONDANSETRON HCL 4 MG/2ML IJ SOLN
4.0000 mg | Freq: Once | INTRAMUSCULAR | Status: DC | PRN
  Filled 2013-04-20: qty 2

## 2013-04-20 MED ORDER — MIDAZOLAM HCL 5 MG/5ML IJ SOLN
INTRAMUSCULAR | Status: DC | PRN
  Administered 2013-04-20: 2 mg via INTRAVENOUS

## 2013-04-20 MED ORDER — BUPIVACAINE IN DEXTROSE 0.75-8.25 % IT SOLN
INTRATHECAL | Status: DC | PRN
  Administered 2013-04-20: 15 mg via INTRATHECAL

## 2013-04-20 MED ORDER — PREGABALIN 50 MG PO CAPS
50.0000 mg | ORAL_CAPSULE | Freq: Once | ORAL | Status: AC
  Administered 2013-04-20: 50 mg via ORAL

## 2013-04-20 MED ORDER — LACTATED RINGERS IV SOLN
INTRAVENOUS | Status: DC | PRN
  Administered 2013-04-20 (×3): via INTRAVENOUS

## 2013-04-20 MED ORDER — CELECOXIB 100 MG PO CAPS
200.0000 mg | ORAL_CAPSULE | Freq: Two times a day (BID) | ORAL | Status: DC
  Administered 2013-04-20 – 2013-04-23 (×7): 200 mg via ORAL
  Filled 2013-04-20 (×7): qty 2

## 2013-04-20 MED ORDER — SODIUM CHLORIDE 0.9 % IV SOLN
INTRAVENOUS | Status: DC
  Administered 2013-04-20: 13:00:00 via INTRAVENOUS

## 2013-04-20 MED ORDER — METHOCARBAMOL 100 MG/ML IJ SOLN
500.0000 mg | Freq: Once | INTRAVENOUS | Status: AC
  Administered 2013-04-20: 500 mg via INTRAVENOUS
  Filled 2013-04-20: qty 5

## 2013-04-20 MED ORDER — METOCLOPRAMIDE HCL 5 MG/ML IJ SOLN: 5.0000 mg | Freq: Three times a day (TID) | INTRAMUSCULAR | Status: DC | PRN

## 2013-04-20 MED ORDER — FENTANYL CITRATE 0.05 MG/ML IJ SOLN
25.0000 ug | INTRAMUSCULAR | Status: DC
  Administered 2013-04-20: 25 ug via INTRAVENOUS

## 2013-04-20 MED ORDER — CELECOXIB 400 MG PO CAPS
ORAL_CAPSULE | ORAL | Status: AC
  Filled 2013-04-20: qty 1

## 2013-04-20 MED ORDER — ALLOPURINOL 300 MG PO TABS
300.0000 mg | ORAL_TABLET | Freq: Every day | ORAL | Status: DC
  Administered 2013-04-20 – 2013-04-23 (×4): 300 mg via ORAL
  Filled 2013-04-20 (×4): qty 1

## 2013-04-20 MED ORDER — ACETAMINOPHEN 650 MG RE SUPP: 650.0000 mg | Freq: Four times a day (QID) | RECTAL | Status: DC | PRN

## 2013-04-20 MED ORDER — TRANEXAMIC ACID 100 MG/ML IV SOLN
1000.0000 mg | INTRAVENOUS | Status: AC
  Administered 2013-04-20: 1000 mg via INTRAVENOUS
  Filled 2013-04-20: qty 10

## 2013-04-20 MED ORDER — CITALOPRAM HYDROBROMIDE 20 MG PO TABS
10.0000 mg | ORAL_TABLET | Freq: Every day | ORAL | Status: DC
  Administered 2013-04-20 – 2013-04-23 (×4): 10 mg via ORAL
  Filled 2013-04-20 (×4): qty 1

## 2013-04-20 MED ORDER — ALUM & MAG HYDROXIDE-SIMETH 200-200-20 MG/5ML PO SUSP: 30.0000 mL | ORAL | Status: DC | PRN

## 2013-04-20 MED ORDER — DOCUSATE SODIUM 100 MG PO CAPS
100.0000 mg | ORAL_CAPSULE | Freq: Two times a day (BID) | ORAL | Status: DC
  Administered 2013-04-20 – 2013-04-23 (×7): 100 mg via ORAL
  Filled 2013-04-20 (×7): qty 1

## 2013-04-20 MED ORDER — DIPHENHYDRAMINE HCL 12.5 MG/5ML PO ELIX: 12.5000 mg | ORAL_SOLUTION | ORAL | Status: DC | PRN

## 2013-04-20 MED ORDER — MIDAZOLAM HCL 2 MG/2ML IJ SOLN
1.0000 mg | INTRAMUSCULAR | Status: DC | PRN
  Administered 2013-04-20: 2 mg via INTRAVENOUS

## 2013-04-20 MED ORDER — SODIUM CHLORIDE BACTERIOSTATIC 0.9 % IJ SOLN
INTRAMUSCULAR | Status: AC
  Filled 2013-04-20: qty 10

## 2013-04-20 MED ORDER — LACTATED RINGERS IV SOLN
INTRAVENOUS | Status: DC
  Administered 2013-04-20: 11:00:00 via INTRAVENOUS

## 2013-04-20 MED ORDER — MENTHOL 3 MG MT LOZG: 1.0000 | LOZENGE | OROMUCOSAL | Status: DC | PRN

## 2013-04-20 MED ORDER — ONDANSETRON HCL 4 MG/2ML IJ SOLN
INTRAMUSCULAR | Status: AC
  Filled 2013-04-20: qty 2

## 2013-04-20 MED ORDER — ETODOLAC 200 MG PO CAPS
200.0000 mg | ORAL_CAPSULE | Freq: Every day | ORAL | Status: DC
  Administered 2013-04-20: 200 mg via ORAL
  Filled 2013-04-20 (×3): qty 1

## 2013-04-20 MED ORDER — MAGNESIUM CITRATE PO SOLN
1.0000 | Freq: Once | ORAL | Status: AC | PRN
Start: 2013-04-20 — End: 2013-04-20

## 2013-04-20 MED ORDER — PHENOL 1.4 % MT LIQD: 1.0000 | OROMUCOSAL | Status: DC | PRN

## 2013-04-20 MED ORDER — SODIUM CHLORIDE 0.9 % IJ SOLN
INTRAMUSCULAR | Status: AC
  Filled 2013-04-20: qty 40

## 2013-04-20 MED ORDER — PROPOFOL INFUSION 10 MG/ML OPTIME
INTRAVENOUS | Status: DC | PRN
  Administered 2013-04-20: 75 ug/kg/min via INTRAVENOUS
  Administered 2013-04-20: 09:00:00 via INTRAVENOUS

## 2013-04-20 SURGICAL SUPPLY — 71 items
BIT DRILL 2.8X128 (BIT) ×2 IMPLANT
BIT DRILL 2.8X128MM (BIT) ×1
BLADE HEX COATED 2.75 (ELECTRODE) ×3 IMPLANT
BLADE SAGITTAL 25.0X1.27X90 (BLADE) ×2 IMPLANT
BLADE SAGITTAL 25.0X1.27X90MM (BLADE) ×1
BLADE SURG SZ10 CARB STEEL (BLADE) ×3 IMPLANT
BNDG COHESIVE 4X5 TAN STRL (GAUZE/BANDAGES/DRESSINGS) ×3 IMPLANT
CLOTH BEACON ORANGE TIMEOUT ST (SAFETY) ×3 IMPLANT
COVER LIGHT HANDLE STERIS (MISCELLANEOUS) ×6 IMPLANT
COVER PROBE W GEL 5X96 (DRAPES) ×3 IMPLANT
DECANTER SPIKE VIAL GLASS SM (MISCELLANEOUS) ×6 IMPLANT
DRAPE BACK TABLE (DRAPES) ×6 IMPLANT
DRAPE HIP W/POCKET STRL (DRAPE) ×3 IMPLANT
DRAPE INCISE IOBAN 44X35 STRL (DRAPES) ×3 IMPLANT
DRAPE U-SHAPE 47X51 STRL (DRAPES) ×6 IMPLANT
DRSG MEPILEX BORDER 4X12 (GAUZE/BANDAGES/DRESSINGS) ×3 IMPLANT
DURAPREP 26ML APPLICATOR (WOUND CARE) ×6 IMPLANT
ELECT REM PT RETURN 9FT ADLT (ELECTROSURGICAL) ×3
ELECTRODE REM PT RTRN 9FT ADLT (ELECTROSURGICAL) ×1 IMPLANT
FACESHIELD OPICON STD (MASK) ×3 IMPLANT
GLOVE BIO SURGEON STRL SZ7 (GLOVE) ×3 IMPLANT
GLOVE BIO SURGEON STRL SZ7.5 (GLOVE) ×3 IMPLANT
GLOVE BIOGEL PI IND STRL 7.0 (GLOVE) ×3 IMPLANT
GLOVE BIOGEL PI IND STRL 7.5 (GLOVE) ×2 IMPLANT
GLOVE BIOGEL PI INDICATOR 7.0 (GLOVE) ×6
GLOVE BIOGEL PI INDICATOR 7.5 (GLOVE) ×4
GLOVE ECLIPSE 6.5 STRL STRAW (GLOVE) ×3 IMPLANT
GLOVE ECLIPSE 7.0 STRL STRAW (GLOVE) ×9 IMPLANT
GLOVE EXAM NITRILE LRG STRL (GLOVE) ×3 IMPLANT
GLOVE OPTIFIT SS 8.0 STRL (GLOVE) ×3 IMPLANT
GLOVE SKINSENSE NS SZ8.0 LF (GLOVE) ×4
GLOVE SKINSENSE STRL SZ8.0 LF (GLOVE) ×2 IMPLANT
GLOVE SS N UNI LF 8.5 STRL (GLOVE) ×3 IMPLANT
GOWN STRL REUS W/TWL LRG LVL3 (GOWN DISPOSABLE) ×9 IMPLANT
GOWN STRL REUS W/TWL XL LVL3 (GOWN DISPOSABLE) ×3 IMPLANT
HANDPIECE INTERPULSE COAX TIP (DISPOSABLE) ×2
HIP/VIT E LINR/CERM HD LEV 1B ×3 IMPLANT
HOOD W/PEELAWAY (MISCELLANEOUS) ×12 IMPLANT
INST SET MAJOR BONE (KITS) ×3 IMPLANT
IV NS IRRIG 3000ML ARTHROMATIC (IV SOLUTION) ×3 IMPLANT
KIT BLADEGUARD II DBL (SET/KITS/TRAYS/PACK) ×3 IMPLANT
KIT ROOM TURNOVER APOR (KITS) ×3 IMPLANT
MANIFOLD NEPTUNE II (INSTRUMENTS) ×3 IMPLANT
MARKER SKIN DUAL TIP RULER LAB (MISCELLANEOUS) ×3 IMPLANT
NEEDLE HYPO 18GX1.5 BLUNT FILL (NEEDLE) ×6 IMPLANT
NEEDLE HYPO 21X1.5 SAFETY (NEEDLE) ×3 IMPLANT
NEEDLE HYPO 25X1 1.5 SAFETY (NEEDLE) ×3 IMPLANT
NEEDLE MAYO 1/2 CIRCLE (NEEDLE) ×3 IMPLANT
NS IRRIG 1000ML POUR BTL (IV SOLUTION) ×3 IMPLANT
PACK TOTAL JOINT (CUSTOM PROCEDURE TRAY) ×3 IMPLANT
PAD ARMBOARD 7.5X6 YLW CONV (MISCELLANEOUS) ×3 IMPLANT
PILLOW HIP ABDUCTION LRG (ORTHOPEDIC SUPPLIES) ×3 IMPLANT
PIN STMN 9X.142 IN (PIN) ×6 IMPLANT
SET BASIN LINEN APH (SET/KITS/TRAYS/PACK) ×3 IMPLANT
SET HNDPC FAN SPRY TIP SCT (DISPOSABLE) ×1 IMPLANT
SPONGE LAP 18X18 X RAY DECT (DISPOSABLE) ×3 IMPLANT
STAPLER VISISTAT 35W (STAPLE) ×3 IMPLANT
SUT BRALON NAB BRD #1 30IN (SUTURE) ×9 IMPLANT
SUT ETHIBOND 5 LR DA (SUTURE) ×6 IMPLANT
SUT MNCRL 0 VIOLET CTX 36 (SUTURE) ×2 IMPLANT
SUT MON AB 2-0 CT1 36 (SUTURE) ×3 IMPLANT
SUT MONOCRYL 0 CTX 36 (SUTURE) ×4
SUT VIC AB 1 CT1 27 (SUTURE) ×4
SUT VIC AB 1 CT1 27XBRD ANTBC (SUTURE) ×2 IMPLANT
SYR 20CC LL (SYRINGE) ×15 IMPLANT
SYR 30ML LL (SYRINGE) ×3 IMPLANT
SYR BULB IRRIGATION 50ML (SYRINGE) ×3 IMPLANT
TOWEL OR 17X26 4PK STRL BLUE (TOWEL DISPOSABLE) ×3 IMPLANT
TRAY FOLEY CATH 16FR SILVER (SET/KITS/TRAYS/PACK) ×3 IMPLANT
WATER STERILE IRR 1000ML POUR (IV SOLUTION) ×6 IMPLANT
YANKAUER SUCT 12FT TUBE ARGYLE (SUCTIONS) ×3 IMPLANT

## 2013-04-20 NOTE — Preoperative (Signed)
Beta Blockers   Reason not to administer Beta Blockers:Not Applicable 

## 2013-04-20 NOTE — Interval H&P Note (Signed)
History and Physical Interval Note:  04/20/2013 7:22 AM  [name] [name]  has presented today for surgery, with the diagnosis of Right Hip Osteoarthritis  The various methods of treatment have been discussed with the patient and family. After consideration of risks, benefits and other options for treatment, the patient has consented to  Procedure(s): TOTAL HIP ARTHROPLASTY (Right) as a surgical intervention .  The patient's history has been reviewed, patient examined, no change in status, stable for surgery.  I have reviewed the patient's chart and labs.  Questions were answered to the patient's satisfaction.     [name]

## 2013-04-20 NOTE — Anesthesia Procedure Notes (Addendum)
Spinal  Patient location during procedure: OR Start time: 04/20/2013 7:55 AM Staffing Anesthesiologist: Lerry Liner CRNA/Resident: ADAMS, AMY A Preanesthetic Checklist Completed: patient identified, site marked, surgical consent, pre-op evaluation, timeout performed, IV checked, risks and benefits discussed and monitors and equipment checked Spinal Block Patient position: right lateral decubitus Prep: Betadine Patient monitoring: heart rate, cardiac monitor, continuous pulse ox and blood pressure Approach: right paramedian Location: L3-4 Injection technique: single-shot Needle Needle type: Spinocan  Needle gauge: 22 G Needle length: 9 cm Assessment Sensory level: T8 Additional Notes ATTEMPTS:1 TRAY [id number] TRAY EXPIRATION DATE:03/2014 Attempt spinal x1 by Adams,CRNA; Spinal placed by Dr. Duwayne Heck; Bupivacaine 15mg ;fentanyl 25 mc,epi.1 injected intrathecally at 0755; Patient tolerated well.  Date/Time: 04/20/2013 7:29 AM Performed by: Andree Elk, AMY A Pre-anesthesia Checklist: Patient identified, Timeout performed, Emergency Drugs available, Suction available and Patient being monitored Oxygen Delivery Method: Non-rebreather mask

## 2013-04-20 NOTE — Brief Op Note (Signed)
04/20/2013  10:39 AM  PATIENT:  Consepcion [name]  63 y.o. male  PRE-OPERATIVE DIAGNOSIS:  Right Hip Osteoarthritis  POST-OPERATIVE DIAGNOSIS:  Right Hip Osteoarthritis  Findings: severe OA, shortening contracture, osteophytes, femoral head in varus with severe deformity   Stryker accolade II 127 degree stem with 35 neck  36 / -5 head , tritanium solid 56 shell , 0 36 poly  PROCEDURE:  Procedure(s): TOTAL HIP ARTHROPLASTY (Right)  SURGEON:  Surgeon(s) and Role:    * Carole Civil, MD - Primary  PHYSICIAN ASSISTANT:   ASSISTANTS: BETTY ASHLEY AND CATHERINE PAFE    ANESTHESIA:   spinal  EBL:  Total I/O In: 2200 [I.V.:2200] Out: 450 [Urine:100; Blood:350]  BLOOD ADMINISTERED:none  DRAINS: none   LOCAL MEDICATIONS USED:  MARCAINE 30   and OTHER EXPAREL 20 / 40   SPECIMEN:  No Specimen  DISPOSITION OF SPECIMEN:  N/A  COUNTS:  YES  TOURNIQUET:  * No tourniquets in log *  Indications disabling pain right hip secondary to osteoarthritis.  The patient was identified in the preop holding area and the surgical site was marked and countersigned by the surgeon, the chart was updated. The consent was signed.  The patient was taken to the operating room for spinal anesthetic and then placed in the lateral decubitus position with appropriate padding and axillary roll. 2g of Ancef were given because of the patient's weight of 209 pounds  After sterile prep and drape the timeout was executed  A lateral incision was made centered over the right greater trochanter to perform a direct lateral approach to the hip. After dividing the subcutaneous tissue down to the fascia, the fascia was split in line with the skin incision. Electrocautery was used to obtain hemostasis.   After deep retractors were placed, the gluteus medius was defined and the anterior half was peeled from the group trochanter ; the anterior branch to the femoral circumflex artery was cauterized. Subperiosteal  dissection continued until the gluteus minimus along with the gluteus medius was retracted proximally.  2 Steinmann pins were placed in the pelvis to retract the Glutei. The hip was dislocated anteriorly, a provisional femoral neck cut was made using the cutting guide. The lesser trochanter was then identified with further soft tissue dissection and a second femoral neck cut was made using the same guide.   A box osteotome was used to lateralize entry point into the femoral canal, this was followed by a femoral canal finder and subsequent broaching up to a size 4 femur.  The acetabulum was then cleared of all soft tissue and anterior and posterior retractors were placed. Direct medial reaming was performed with a 43, and 44 mm reamer. Once the medial wall was identified, positional reaming continued up to a size 47mm, with a goal of 40 degrees abduction and anatomic anteversion. A trial 55 mm liner was placed to confirm adequate depth. Once this was accomplished, the final 2 remaining reamings were placed into the acetabulum and a 56 mm cup was placed in press-fit fashion and confirmed to be stable.  Trial reductions were then performed starting with 0. Leg length and stability were restored with -5. We confirmed knee flexion past 90. ROM: 5 hyperextension with 50 of external rotation. We had adequate shuck test. Sleeping position was stable. At 90 flexion the hip internally rotated 50 without dislocation.  The trial components were then removed, the polyethylene size 36MM was secured; drill holes were placed in the trochanter and  #5 sutures  were passed through the drill holes; the stem was placed followed by the femoral head.  The hip was reduced. The ROM tests were repeated and were satisfactory.The # 5 sutures were used to repair the abductors with the leg slightly internally rotated.  The wound was irrigated and 30 cc of Marcaine with epinephrine was injected into the sub-gluteus medius area.  Fascia was closed with the leg abducted with #1 Bralon sutures; followed by subfascial injection of 30 cc of Marcaine with epinephrine. We also injected EXPAREL in 3 separate injections at each layer  Subcutaneous tissue was closed with 0  Monocryl Skin staples were used to reapproximate the skin edges and a sterile dressing was applied.  The patient was taken to the recovery room in stable condition.  Standard postop total hip arthroplasty protocol for direct lateral approach.  DICTATION: IT trainer Dictation  PLAN OF CARE: Admit to inpatient   PATIENT DISPOSITION:  PACU - hemodynamically stable.   Delay start of Pharmacological VTE agent (>24hrs) due to surgical blood loss or risk of bleeding: yes

## 2013-04-20 NOTE — Clinical Social Work Note (Signed)
CSW received referral for possible SNF. PT evaluation indicates pt should progress well and plan is for home with home health. CSW will sign off, but can be reconsulted if needed.  Benay Pike, [place]

## 2013-04-20 NOTE — Anesthesia Preprocedure Evaluation (Signed)
Anesthesia Evaluation  Patient identified by MRN, date of birth, ID band Patient awake    Reviewed: Allergy & Precautions, H&P , NPO status , Patient's Chart, lab work & pertinent test results  Airway Mallampati: II TM Distance: >3 FB Neck ROM: Full    Dental  (+) Teeth Intact   Pulmonary neg pulmonary ROS, former smoker,  Sinus congestion  breath sounds clear to auscultation        Cardiovascular hypertension, Pt. on medications Rhythm:Regular Rate:Normal     Neuro/Psych PSYCHIATRIC DISORDERS Anxiety    GI/Hepatic negative GI ROS,   Endo/Other  diabetes, Well Controlled, Type 2  Renal/GU      Musculoskeletal   Abdominal   Peds  Hematology   Anesthesia Other Findings   Reproductive/Obstetrics                           Anesthesia Physical Anesthesia Plan  ASA: III  Anesthesia Plan: Spinal   Post-op Pain Management:    Induction: Intravenous  Airway Management Planned: Simple Face Mask  Additional Equipment:   Intra-op Plan:   Post-operative Plan:   Informed Consent: I have reviewed the patients History and Physical, chart, labs and discussed the procedure including the risks, benefits and alternatives for the proposed anesthesia with the patient or authorized representative who has indicated his/her understanding and acceptance.     Plan Discussed with:   Anesthesia Plan Comments:         Anesthesia Quick Evaluation

## 2013-04-20 NOTE — Progress Notes (Signed)
pts BP was rechecked and has returned to normal limits- 110/67. [name] [name]

## 2013-04-20 NOTE — Interval H&P Note (Signed)
History and Physical Interval Note:  04/20/2013 7:23 AM  Consepcion Hearing  has presented today for surgery, with the diagnosis of Right Hip Osteoarthritis  The various methods of treatment have been discussed with the patient and family. After consideration of risks, benefits and other options for treatment, the patient has consented to  Procedure(s): TOTAL HIP ARTHROPLASTY (Right) as a surgical intervention .  The patient's history has been reviewed, patient examined, no change in status, stable for surgery.  I have reviewed the patient's chart and labs.  Questions were answered to the patient's satisfaction.     [name]

## 2013-04-20 NOTE — Op Note (Signed)
04/20/2013  10:39 AM  PATIENT:  [name] [name]  63 y.o. male  PRE-OPERATIVE DIAGNOSIS:  Right Hip Osteoarthritis  POST-OPERATIVE DIAGNOSIS:  Right Hip Osteoarthritis  Findings: severe OA, shortening contracture, osteophytes, femoral head in varus with severe deformity   Stryker accolade II 127 degree stem with 35 neck  36 / -5 head , tritanium solid 56 shell , 0 36 poly  PROCEDURE:  Procedure(s): TOTAL HIP ARTHROPLASTY (Right)  SURGEON:  Surgeon(s) and Role:    * Carole Civil, MD - Primary  PHYSICIAN ASSISTANT:   ASSISTANTS: BETTY ASHLEY AND CATHERINE PAFE    ANESTHESIA:   spinal  EBL:  Total I/O In: 2200 [I.V.:2200] Out: 450 [Urine:100; Blood:350]  BLOOD ADMINISTERED:none  DRAINS: none   LOCAL MEDICATIONS USED:  MARCAINE 30   and OTHER EXPAREL 20 / 40   SPECIMEN:  No Specimen  DISPOSITION OF SPECIMEN:  N/A  COUNTS:  YES  TOURNIQUET:  * No tourniquets in log *  Indications disabling pain right hip secondary to osteoarthritis.  The patient was identified in the preop holding area and the surgical site was marked and countersigned by the surgeon, the chart was updated. The consent was signed.  The patient was taken to the operating room for spinal anesthetic and then placed in the lateral decubitus position with appropriate padding and axillary roll. 2g of Ancef were given because of the patient's weight of 209 pounds  After sterile prep and drape the timeout was executed  A lateral incision was made centered over the right greater trochanter to perform a direct lateral approach to the hip. After dividing the subcutaneous tissue down to the fascia, the fascia was split in line with the skin incision. Electrocautery was used to obtain hemostasis.   After deep retractors were placed, the gluteus medius was defined and the anterior half was peeled from the group trochanter ; the anterior branch to the femoral circumflex artery was cauterized. Subperiosteal  dissection continued until the gluteus minimus along with the gluteus medius was retracted proximally.  2 Steinmann pins were placed in the pelvis to retract the Glutei. The hip was dislocated anteriorly, a provisional femoral neck cut was made using the cutting guide. The lesser trochanter was then identified with further soft tissue dissection and a second femoral neck cut was made using the same guide.   A box osteotome was used to lateralize entry point into the femoral canal, this was followed by a femoral canal finder and subsequent broaching up to a size 4 femur.  The acetabulum was then cleared of all soft tissue and anterior and posterior retractors were placed. Direct medial reaming was performed with a 43, and 44 mm reamer. Once the medial wall was identified, positional reaming continued up to a size 47mm, with a goal of 40 degrees abduction and anatomic anteversion. A trial 55 mm liner was placed to confirm adequate depth. Once this was accomplished, the final 2 remaining reamings were placed into the acetabulum and a 56 mm cup was placed in press-fit fashion and confirmed to be stable.  Trial reductions were then performed starting with 0. Leg length and stability were restored with -5. We confirmed knee flexion past 90. ROM: 5 hyperextension with 50 of external rotation. We had adequate shuck test. Sleeping position was stable. At 90 flexion the hip internally rotated 50 without dislocation.  The trial components were then removed, the polyethylene size 36MM was secured; drill holes were placed in the trochanter and  #5 sutures  were passed through the drill holes; the stem was placed followed by the femoral head.  The hip was reduced. The ROM tests were repeated and were satisfactory.The # 5 sutures were used to repair the abductors with the leg slightly internally rotated.  The wound was irrigated and 30 cc of Marcaine with epinephrine was injected into the sub-gluteus medius area.  Fascia was closed with the leg abducted with #1 Bralon sutures; followed by subfascial injection of 30 cc of Marcaine with epinephrine. We also injected EXPAREL in 3 separate injections at each layer  Subcutaneous tissue was closed with 0  Monocryl Skin staples were used to reapproximate the skin edges and a sterile dressing was applied.  The patient was taken to the recovery room in stable condition.  Standard postop total hip arthroplasty protocol for direct lateral approach.  DICTATION: .Dragon Dictation  PLAN OF CARE: Admit to inpatient   PATIENT DISPOSITION:  PACU - hemodynamically stable.   Delay start of Pharmacological VTE agent (>24hrs) due to surgical blood loss or risk of bleeding: yes  

## 2013-04-20 NOTE — Transfer of Care (Signed)
Immediate Anesthesia Transfer of Care Note  Patient: Consepcion Hearing  Procedure(s) Performed: Procedure(s): TOTAL HIP ARTHROPLASTY (Right)  Patient Location: PACU  Anesthesia Type:Spinal  Level of Consciousness: awake, alert , oriented and patient cooperative  Airway & Oxygen Therapy: Patient Spontanous Breathing and Patient connected to nasal cannula oxygen  Post-op Assessment: Report given to PACU RN and Post -op Vital signs reviewed and stable  Post vital signs: Reviewed and stable  Complications: No apparent anesthesia complications

## 2013-04-20 NOTE — Evaluation (Signed)
Physical Therapy Evaluation Patient Details Name: [name] [name] MRN: [id number] DOB: 12/08/1950 Today's Date: 04/20/2013 Time: 1400-1440 PT Time Calculation (min): 40 min  PT Assessment / Plan / Recommendation History of Present Illness  Pt states that he had severe pain in the right hip and decided to have it replaced.  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  He is in otherwise good health and has arranged for a friend to stay with him   Clinical Impression  Pt was seen for evaluation/tx.  He is very alert and oriented, extremely cooperative.  Pain is well controlled.  He was instructed in THR precautions and general hip protocol.  He was instructed in therapeutic exercise per protocol and his hip ROM was WNL with assist.  He was able to transfer OOB with min assist and was instructed in gait with walker, WBAT right, using a walker.  He should progress well and be able to transition to home at d/c.  PT Assessment  Patient needs continued PT services    Follow Up Recommendations  Home health PT    Does the patient have the potential to tolerate intense rehabilitation      Barriers to Discharge   pt will need to learn how to climb steps before discharge    Equipment Recommendations  Rolling walker with 5" wheels;3in1 (PT)    Recommendations for Other Services     Frequency 7X/week    Precautions / Restrictions Precautions Precautions: Anterior Hip Precaution Comments: pt stated that booklet was mailed to him Restrictions Weight Bearing Restrictions: No RLE Weight Bearing: Weight bearing as tolerated   Pertinent Vitals/Pain       Mobility  Bed Mobility Overal bed mobility: Needs Assistance Bed Mobility: Supine to Sit Supine to sit: Supervision Transfers Overall transfer level: Needs assistance Equipment used: Rolling walker (2 wheeled) Transfers: Sit to/from Stand Sit to Stand: Supervision Ambulation/Gait Ambulation/Gait  assistance: Supervision Ambulation Distance (Feet): 20 Feet Assistive device: Rolling walker (2 wheeled) Gait Pattern/deviations: Antalgic General Gait Details: pt tends to lean backward in order to advance the RLE during gait...he was instructed in correct gait pattern    Exercises Total Joint Exercises Ankle Circles/Pumps: AROM;Both;10 reps;Supine Quad Sets: AROM;Both;10 reps;Supine Gluteal Sets: AROM;Both;10 reps;Supine Short Arc Quad: AAROM;Right;10 reps;Supine Heel Slides: AAROM;Right;10 reps;Supine   PT Diagnosis: Difficulty walking;Acute pain;Generalized weakness  PT Problem List: Decreased strength;Decreased range of motion;Decreased activity tolerance;Decreased mobility;Decreased knowledge of use of DME;Decreased safety awareness;Decreased knowledge of precautions;Pain PT Treatment Interventions: DME instruction;Gait training;Stair training;Functional mobility training;Therapeutic exercise;Patient/family education     PT Goals(Current goals can be found in the care plan section) Acute Rehab PT Goals Patient Stated Goal: return to independence, pain free PT Goal Formulation: With patient Time For Goal Achievement: 06/02/13 Potential to Achieve Goals: Good  Visit Information  Last PT Received On: 04/20/13 History of Present Illness: Pt states that he had severe pain in the right hip and decided to have it replaced.  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  He is in otherwise good health and has arranged for a friend to stay with him        Prior [place] expects to be discharged to:: Private residence Living Arrangements: Alone Available Help at Discharge: Friend(s);Available 24 hours/day Type of Home: House Home Access: Stairs to enter [employer] of Steps: 5 Entrance Stairs-Rails: None Home Layout: One  level Home Equipment: None Additional Comments: pt has another entrance to the  home which does have a railing Prior Function Level of Independence: Independent Communication Communication: No difficulties    Cognition  Cognition Arousal/Alertness: Awake/alert Behavior During Therapy: WFL for tasks assessed/performed Overall Cognitive Status: Within Functional Limits for tasks assessed    Extremity/Trunk Assessment Lower Extremity Assessment Lower Extremity Assessment: RLE deficits/detail RLE Deficits / Details: weakness due to recent surgery...has AA hip ROM to wWNL   Balance Balance Overall balance assessment: No apparent balance deficits (not formally assessed)  End of Session PT - End of Session Equipment Utilized During Treatment: Gait belt Activity Tolerance: Patient tolerated treatment well Patient left: in chair;with call bell/phone within reach Nurse Communication: Mobility status  GP     Sable Feil 04/20/2013, 2:44 PM

## 2013-04-20 NOTE — Progress Notes (Signed)
Notified Dr. Aline Brochure that pts BP is 188/101, pt is asymptomatic. Pt took daily dose of lisinopril at home this morning, order received for additional dose at this time. Nursing will continue to monitor BP. Marry Guan

## 2013-04-20 NOTE — Anesthesia Postprocedure Evaluation (Signed)
  Anesthesia Post-op Note  Patient: Consepcion Hearing  Procedure(s) Performed: Procedure(s): TOTAL HIP ARTHROPLASTY (Right)  Patient Location: PACU  Anesthesia Type:Spinal  Level of Consciousness: awake, alert , oriented and patient cooperative  Airway and Oxygen Therapy: Patient Spontanous Breathing and Patient connected to nasal cannula oxygen  Post-op Pain: none  Post-op Assessment: Post-op Vital signs reviewed, Patient's Cardiovascular Status Stable, Respiratory Function Stable, Patent Airway, No signs of Nausea or vomiting and Pain level controlled  Post-op Vital Signs: Reviewed and stable  Complications: No apparent anesthesia complications

## 2013-04-21 LAB — BASIC METABOLIC PANEL
BUN: 14 mg/dL (ref 6–23)
CALCIUM: 8.3 mg/dL — AB (ref 8.4–10.5)
CO2: 29 mEq/L (ref 19–32)
Chloride: 102 mEq/L (ref 96–112)
Creatinine, Ser: 1.06 mg/dL (ref 0.50–1.35)
GFR calc Af Amer: 84 mL/min — ABNORMAL LOW (ref >90)
GFR calc non Af Amer: 72 mL/min — ABNORMAL LOW (ref >90)
Glucose, Bld: 111 mg/dL — ABNORMAL HIGH (ref 70–99)
Potassium: 4.4 mEq/L (ref 3.7–5.3)
SODIUM: 139 meq/L (ref 137–147)

## 2013-04-21 LAB — CBC
HCT: 43.4 % (ref 39.0–52.0)
Hemoglobin: 14.6 g/dL (ref 13.0–17.0)
MCH: 30.5 pg (ref 26.0–34.0)
MCHC: 33.6 g/dL (ref 30.0–36.0)
MCV: 90.6 fL (ref 78.0–100.0)
PLATELETS: 167 10*3/uL (ref 150–400)
RBC: 4.79 MIL/uL (ref 4.22–5.81)
RDW: 13.1 % (ref 11.5–15.5)
WBC: 9 10*3/uL (ref 4.0–10.5)

## 2013-04-21 MED ORDER — METOPROLOL TARTRATE 25 MG PO TABS
12.5000 mg | ORAL_TABLET | Freq: Every day | ORAL | Status: DC | PRN
  Administered 2013-04-21: 12.5 mg via ORAL
  Filled 2013-04-21: qty 1

## 2013-04-21 MED ORDER — LISINOPRIL 5 MG PO TABS
5.0000 mg | ORAL_TABLET | Freq: Every day | ORAL | Status: DC
  Administered 2013-04-22 – 2013-04-23 (×2): 5 mg via ORAL
  Filled 2013-04-21 (×3): qty 1

## 2013-04-21 MED ORDER — LISINOPRIL 5 MG PO TABS
2.5000 mg | ORAL_TABLET | Freq: Once | ORAL | Status: AC
  Administered 2013-04-21: 2.5 mg via ORAL

## 2013-04-21 NOTE — Progress Notes (Signed)
Per patients request the patient wanted to wait until he had dinner to take his medications.  I voiced to him that in addition to his regularly scheduled meds he would be getting an additional 2.5 mg of Lisinopril equal the new dosing of 5 mg. Also I would be giving him 12.5 mg of metoprolol to also reduce the BP hopefully.  New updated BP is documented.  Patient is currently stable in this time and no further complaints at this time.

## 2013-04-21 NOTE — Progress Notes (Signed)
Subjective: 1 Day Post-Op Procedure(s) (LRB): TOTAL HIP ARTHROPLASTY (Right) Patient reports pain as 1 on 0-10 scale.    Objective: Vital signs in last 24 hours: Temp:  [97.4 F (36.3 C)-98.3 F (36.8 C)] 98 F (36.7 C) (01/31 0500) Pulse Rate:  [57-97] 79 (01/31 0500) Resp:  [11-20] 20 (01/31 0800) BP: (110-188)/(67-101) 167/76 mmHg (01/31 0500) SpO2:  [94 %-98 %] 95 % (01/31 0800) Weight:  [215 lb 12.8 oz (97.886 kg)] 215 lb 12.8 oz (97.886 kg) (01/30 1145)  Intake/Output from previous day: 01/30 0701 - 01/31 0700 In: 3023.3 [P.O.:240; I.V.:2733.3; IV Piggyback:50] Out: 4235 [Urine:2825; Blood:350] Intake/Output this shift:     Recent Labs  04/21/13 0544  HGB 14.6    Recent Labs  04/21/13 0544  WBC 9.0  RBC 4.79  HCT 43.4  PLT 167    Recent Labs  04/21/13 0544  NA 139  K 4.4  CL 102  CO2 29  BUN 14  CREATININE 1.06  GLUCOSE 111*  CALCIUM 8.3*   No results found for this basename: LABPT, INR,  in the last 72 hours  Neurologically intact Neurovascular intact Sensation intact distally Intact pulses distally Dorsiflexion/Plantar flexion intact Incision: dressing C/D/I Compartment soft  Assessment/Plan: 1 Day Post-Op Procedure(s) (LRB): TOTAL HIP ARTHROPLASTY (Right) Up with therapy  [name] 04/21/2013, 9:56 AM

## 2013-04-21 NOTE — Progress Notes (Signed)
Physical Therapy Treatment Patient Details Name: [name] [name] MRN: [id number] DOB: [birth year] Today's Date: 04/21/2013 Time: 7893-8101 PT Time Calculation (min): 20 min  PT Assessment / Plan / Recommendation     PT Comments   Pt states he's been up walking already this moring with nursing.  States he is currently not having any pain.  Modified independent with bed mobility and transfers.  Pt a little unsteady when initially stood but able to regain balance independently.  Increased ambulation distance to 125 feet today with front wheeled walker (pt would like wheeled attachments for his standard walker in room for discharge).  Pt requested to return to bed due to nausea.  Encouraged patient to perform 10 quad sets every hour to increase Rt quad strength.  Pt progressing well.  Follow Up Recommendations  Home health PT.   Equipment Recommendations  Pt has standard walker but would like front wheel attachments    Plan Current plan remains appropriate    Precautions / Restrictions Precautions Precautions: Anterior Hip       Mobility  Bed Mobility Overal bed mobility: Modified Independent Bed Mobility: Supine to Sit;Sit to Supine Supine to sit: Modified independent (Device/Increase time) Sit to supine: Modified independent (Device/Increase time) Transfers Overall transfer level: Needs assistance Equipment used: Rolling walker (2 wheeled) Transfers: Sit to/from Stand Sit to Stand: Supervision General transfer comment: Pt a little unsteady when initially stood, however needed no physical assistance to transfer sit to stand.  VC's needed for hand placement. Ambulation/Gait Ambulation/Gait assistance: Supervision Ambulation Distance (Feet): 125 Feet Assistive device: Rolling walker (2 wheeled) Gait Pattern/deviations: Step-through pattern;Decreased weight shift to right General Gait Details: Pt remained upright today with ambulation, without leaning backward.  Good steppage and  cadence.    Exercises Total Joint Exercises Ankle Circles/Pumps: AROM;Both;10 reps;Supine Quad Sets: AROM;Both;10 reps;Supine Heel Slides: AAROM;Right;Supine;5 reps    PT Goals (current goals can now be found in the care plan section)    Visit Information  Last PT Received On: 04/21/13    Subjective Data   Pt reports currently without pain. States he's been a little nauseated today.   Cognition  Cognition Arousal/Alertness: Awake/alert Behavior During Therapy: WFL for tasks assessed/performed Overall Cognitive Status: Within Functional Limits for tasks assessed       End of Session PT - End of Session Equipment Utilized During Treatment: Gait belt Activity Tolerance: Patient tolerated treatment well Patient left: in bed;with call bell/phone within reach;with bed alarm set   GP     Teena Irani, PTA/CLT 04/21/2013, 11:50 AM

## 2013-04-22 LAB — CBC
HEMATOCRIT: 41 % (ref 39.0–52.0)
HEMOGLOBIN: 13.8 g/dL (ref 13.0–17.0)
MCH: 30.5 pg (ref 26.0–34.0)
MCHC: 33.7 g/dL (ref 30.0–36.0)
MCV: 90.5 fL (ref 78.0–100.0)
Platelets: 162 10*3/uL (ref 150–400)
RBC: 4.53 MIL/uL (ref 4.22–5.81)
RDW: 13 % (ref 11.5–15.5)
WBC: 9.9 10*3/uL (ref 4.0–10.5)

## 2013-04-22 MED ORDER — SODIUM CHLORIDE 0.9 % IJ SOLN
3.0000 mL | Freq: Two times a day (BID) | INTRAMUSCULAR | Status: DC
  Administered 2013-04-22 – 2013-04-23 (×2): 3 mL via INTRAVENOUS

## 2013-04-22 MED ORDER — SODIUM CHLORIDE 0.9 % IV SOLN: 250.0000 mL | INTRAVENOUS | Status: DC | PRN

## 2013-04-22 MED ORDER — SODIUM CHLORIDE 0.9 % IJ SOLN: 3.0000 mL | INTRAMUSCULAR | Status: DC | PRN

## 2013-04-22 MED ORDER — MAGNESIUM CITRATE PO SOLN
0.5000 | Freq: Once | ORAL | Status: AC
  Administered 2013-04-22: 0.5 via ORAL
  Filled 2013-04-22: qty 296

## 2013-04-22 NOTE — Progress Notes (Signed)
Subjective: 2 Days Post-Op Procedure(s) (LRB): TOTAL HIP ARTHROPLASTY (Right) Patient reports pain as mild.    Objective: Vital signs in last 24 hours: Temp:  [97.4 F (36.3 C)-98.4 F (36.9 C)] 98.3 F (36.8 C) (02/01 0546) Pulse Rate:  [76-90] 77 (02/01 0546) Resp:  [16-20] 20 (02/01 0546) BP: (138-174)/(77-87) 164/78 mmHg (02/01 0546) SpO2:  [95 %-99 %] 95 % (02/01 0546)  Intake/Output from previous day: 01/31 0701 - 02/01 0700 In: 720 [P.O.:720] Out: 950 [Urine:950] Intake/Output this shift: Total I/O In: 120 [P.O.:120] Out: 200 [Urine:200]   Recent Labs  04/21/13 0544 04/22/13 0528  HGB 14.6 13.8    Recent Labs  04/21/13 0544 04/22/13 0528  WBC 9.0 9.9  RBC 4.79 4.53  HCT 43.4 41.0  PLT 167 162    Recent Labs  04/21/13 0544  NA 139  K 4.4  CL 102  CO2 29  BUN 14  CREATININE 1.06  GLUCOSE 111*  CALCIUM 8.3*   No results found for this basename: LABPT, INR,  in the last 72 hours  Incision: dressing C/D/I and no drainage  Assessment/Plan: 2 Days Post-Op Procedure(s) (LRB): TOTAL HIP ARTHROPLASTY (Right) Up with therapy D/C IV fluids Plan for discharge tomorrow  [name] 04/22/2013, 8:43 AM

## 2013-04-22 NOTE — Progress Notes (Signed)
Physical Therapy Treatment Patient Details Name: [name] [name] MRN: [id number] DOB: 1950/05/20 Today's Date: 04/22/2013 Time: 8657-8469 PT Time Calculation (min): 25 min  PT Assessment / Plan / Recommendation     PT Comments   Pt states he does not have any pain today.  Friend/CG present for treatment today.  Pt up in chair upon arrival.  Pt able to stand modified independently and complete ambulation 200' with RW without pain or fatigue.  Instructed with stair negotiation using RW, min assist from CG and no HR's.  Pt able to complete without difficulty.  Pt has met goals and is ready for discharge to [facility].  Follow Up Recommendations  Home health PT     Progress towards PT Goals  Pt has met acute goals  Plan Other (comment)    Precautions / Restrictions Precautions Precautions: Anterior Hip       Mobility  Transfers Overall transfer level: Modified independent Equipment used: Rolling walker (2 wheeled) Transfers: Sit to/from Stand Sit to Stand: Modified independent (Device/Increase time) Ambulation/Gait Ambulation/Gait assistance: Modified independent (Device/Increase time) Ambulation Distance (Feet): 200 Feet Assistive device: Rolling walker (2 wheeled) Gait Pattern/deviations: Step-through pattern;Decreased weight shift to right Gait velocity: average Stairs: Yes Stairs assistance: Min assist Stair Management: No rails Number of Stairs: 3 General stair comments: Instructed patient and caregiver using walker and backward technique.  Pt does not have handrails on stairs at home.    Exercises  Pt performed independently prior to therapist arrival.  Pt now able to perform LAQ RLE without assistance.   PT Goals (current goals can now be found in the care plan section)    Visit Information  Last PT Received On: 04/22/13    Subjective Data   Pt reports compliance with exercises, completed 10 quad sets every hour after therapy yesterday.  Pt. Without pain.   Cognition  Cognition Arousal/Alertness: Awake/alert Behavior During Therapy: WFL for tasks assessed/performed Overall Cognitive Status: Within Functional Limits for tasks assessed       End of Session PT - End of Session Equipment Utilized During Treatment: Gait belt Activity Tolerance: Patient tolerated treatment well Patient left: in chair;with call bell/phone within reach;with family/visitor present     Teena Irani, PTA/CLT 04/22/2013, 12:01 PM

## 2013-04-22 NOTE — Anesthesia Postprocedure Evaluation (Signed)
  Anesthesia Post-op Note  Patient: Consepcion Hearing  Procedure(s) Performed: Procedure(s): TOTAL HIP ARTHROPLASTY (Right)  Patient Location: Nursing Unit  Anesthesia Type:Spinal  Level of Consciousness: awake, alert  and oriented  Airway and Oxygen Therapy: Patient Spontanous Breathing  Post-op Pain: mild  Post-op Assessment: Post-op Vital signs reviewed, Patient's Cardiovascular Status Stable, Respiratory Function Stable and Patent Airway, little nausea with pain meds, but doing great!  Post-op Vital Signs: Reviewed and stable  Complications: No apparent anesthesia complications

## 2013-04-23 ENCOUNTER — Encounter (HOSPITAL_COMMUNITY): Payer: Self-pay | Admitting: Orthopedic Surgery

## 2013-04-23 LAB — CBC
HEMATOCRIT: 41.7 % (ref 39.0–52.0)
Hemoglobin: 14.1 g/dL (ref 13.0–17.0)
MCH: 30.3 pg (ref 26.0–34.0)
MCHC: 33.8 g/dL (ref 30.0–36.0)
MCV: 89.7 fL (ref 78.0–100.0)
Platelets: 175 10*3/uL (ref 150–400)
RBC: 4.65 MIL/uL (ref 4.22–5.81)
RDW: 13.1 % (ref 11.5–15.5)
WBC: 9.7 10*3/uL (ref 4.0–10.5)

## 2013-04-23 LAB — TYPE AND SCREEN
ABO/RH(D): O POS
Antibody Screen: NEGATIVE
UNIT DIVISION: 0
UNIT DIVISION: 0
Unit division: 0
Unit division: 0

## 2013-04-23 MED ORDER — HYDROCODONE-ACETAMINOPHEN 7.5-325 MG PO TABS: 1.0000 | ORAL_TABLET | ORAL | Status: DC

## 2013-04-23 MED ORDER — ASPIRIN 325 MG PO TBEC: 325.0000 mg | DELAYED_RELEASE_TABLET | Freq: Every day | ORAL | Status: DC

## 2013-04-23 MED ORDER — POLYETHYLENE GLYCOL 3350 17 G PO PACK
17.0000 g | PACK | Freq: Every day | ORAL | Status: DC | PRN
Start: 2013-04-23 — End: 2013-10-10

## 2013-04-23 NOTE — Care Management Note (Addendum)
    Page 1 of 2   04/23/2013     2:06:45 PM   CARE MANAGEMENT NOTE 04/23/2013  Patient:  [name] [name], [name] [name]   Account Number:  [id number]  Date Initiated:  04/23/2013  Documentation initiated by:  Theophilus Kinds  Subjective/Objective Assessment:   Pt admitted s/p hip arthroplasty. Pt lives alone but will have someone staying with him at discharge. Pt has been independent with ADl's.     Action/Plan:   DME and HH orders sent to [facility]. [facility] will call with providers once arranged and RW will be delivered to pts room prior to discharge. Pt for discharge today. [place] services to start within 48 hours of discharge. Pt/RN aware.   Anticipated DC Date:  04/23/2013   Anticipated DC Plan:  [place]  CM consult      PAC Choice  [place]   Choice offered to / List presented to:  NA   DME arranged  3-N-1  [place]      DME agency  [place] arranged  HH-1 RN  [facility]      [place] agency  [place].   Status of service:  Completed, signed off Medicare Important Message given?   (If response is "NO", the following Medicare IM given date fields will be blank) Date Medicare IM given:   Date Additional Medicare IM given:    Discharge Disposition:  [place]  Per UR Regulation:    If discussed at Long Length of Stay Meetings, dates discussed:    Comments:  04/23/13 [place], RN BSN CM Pt [place] has been arrange by [facility] with [facility] is still arranging DME for pt.  04/23/13 [place], RN BSN CM

## 2013-04-23 NOTE — Progress Notes (Signed)
[facility] arrived at the patients room to deliver his supplies ordered.  Discharge instructions, any prescriptions, and care notes given.  Pt verbalized understanding.  The patient is ready to leave the floor he is in stable condition.  His dressing was changed prior to discharge.

## 2013-04-23 NOTE — Discharge Summary (Signed)
Physician Discharge Summary  Patient ID: [name] [name] MRN: [id number] DOB/AGE: Jan 20, 1951 63 y.o.  Admit date: 04/20/2013 Discharge date: 04/23/2013  Admission Diagnoses: OA RIGHT HIP   Discharge Diagnoses: OA RIGHT HIP  Active Problems:   Arthritis of right hip   Discharged Condition: stable  Hospital Course: NORMAL   Consults: None  Significant Diagnostic Studies:  Hemoglobin & Hematocrit     Component Value Date/Time   HGB 14.1 04/23/2013 0450   HCT 41.7 04/23/2013 0450     Treatments: surgery: RIGHT THA STRYKER ACCOLADE II; TRITANIUM CUP   Discharge Exam: Blood pressure 184/80, pulse 84, temperature 98.2 F (36.8 C), temperature source Oral, resp. rate 20, height 5\' 8"  (1.727 m), weight 215 lb 12.8 oz (97.886 kg), SpO2 95.00%. Incision/Wound: CLEAN   Disposition: 01-Home or Self Care  Discharge Orders   Future Appointments Provider Department Dept Phone   05/08/2013 1:45 PM Carole Civil, MD [facility] [phone]   06/25/2013 1:10 PM Kathyrn Drown, MD [place] [phone]   Future Orders Complete By Expires   Face-to-face encounter (required for Medicare/Medicaid patients)  As directed    Comments:     I [name] certify that this patient is under my care and that I, or a nurse practitioner or physician's assistant working with me, had a face-to-face encounter that meets the physician face-to-face encounter requirements with this patient on 04/23/2013. The encounter with the patient was in whole, or in part for the following medical condition(s) which is the primary reason for home health care (List medical condition): THA   Questions:     The encounter with the patient was in whole, or in part, for the following medical condition, which is the primary reason for home health care:  RIGHT TOTAL HIP   I certify that, based on my findings, the following services are medically necessary home health services:   Nursing   Physical therapy   My clinical findings support the need for the above services:  Unsafe ambulation due to balance issues   Further, I certify that my clinical findings support that this patient is homebound due to:  Unable to leave home safely without assistance   Reason for Medically Necessary Home Health Services:  Therapy- Personnel officer, Public librarian   Home Health  As directed    Scheduling Instructions:     RN REMOVE STAPLES POD 14   Questions:     To provide the following care/treatments:  PT   RN       Medication List    STOP taking these medications       acetaminophen-codeine 300-30 MG per tablet  Commonly known as:  TYLENOL #3      TAKE these medications       allopurinol 100 MG tablet  Commonly known as:  ZYLOPRIM  TAKE 1 TABLET DAILY     aspirin 325 MG EC tablet  Take 1 tablet (325 mg total) by mouth daily with breakfast.     AXIRON 30 MG/ACT Soln  Generic drug:  Testosterone  Apply 1 application topically daily.     Calcium-Magnesium-Zinc 1000-500-50 MG Tabs  Take 1 tablet by mouth daily.     citalopram 20 MG tablet  Commonly known as:  CELEXA     etodolac 200 MG capsule  Commonly known as:  LODINE  Take 200 mg by mouth at bedtime.     Fish Oil 1200 MG Caps  Take 1 capsule  by mouth daily.     gabapentin 100 MG capsule  Commonly known as:  NEURONTIN  Take 1 capsule (100 mg total) by mouth 3 (three) times daily.     HYDROcodone-acetaminophen 7.5-325 MG per tablet  Commonly known as:  NORCO  Take 1 tablet by mouth every 4 (four) hours.     lisinopril 2.5 MG tablet  Commonly known as:  ZESTRIL  Take 1 tablet (2.5 mg total) by mouth daily.     multivitamin tablet  Take 1 tablet by mouth daily.     naproxen sodium 220 MG tablet  Commonly known as:  ANAPROX  Take 220 mg by mouth at bedtime.     polyethylene glycol packet  Commonly known as:  MIRALAX / GLYCOLAX  Take 17 g by mouth daily as needed for mild  constipation.     pravastatin 80 MG tablet  Commonly known as:  PRAVACHOL  TAKE 1 TABLET DAILY     SUPER B COMPLEX PO  Take 1 tablet by mouth daily.     vitamin E 400 UNIT capsule  Take 400 Units by mouth daily.           Follow-up Information   Follow up with [name], MD On 05/01/2013. [facility] W[facility] )    Specialties:  Orthopedic Surgery, Radiology   Contact information:   [address] [place], [place] [address] [phone]       Signed: [name] 04/23/2013, 12:09 PM

## 2013-04-23 NOTE — Progress Notes (Signed)
UR chart review completed.  

## 2013-04-24 ENCOUNTER — Encounter (HOSPITAL_COMMUNITY): Payer: Self-pay | Admitting: Orthopedic Surgery

## 2013-04-27 ENCOUNTER — Other Ambulatory Visit: Payer: Self-pay | Admitting: Family Medicine

## 2013-04-30 ENCOUNTER — Telehealth: Payer: Self-pay | Admitting: Orthopedic Surgery

## 2013-04-30 NOTE — Telephone Encounter (Signed)
I called [facility] and spoke with Jamas Lav, and she did verify that [name] [name] was added to their system today.

## 2013-04-30 NOTE — Telephone Encounter (Signed)
Patient called to first, confirm post op#1 appointment, which had been moved up to tomorrow 05/01/13.  Secondly, patient said his [place] insurance was to be setting up his physical therapy at home However as of today, 04/30/13, he's had no call back; he has therefore called them back, and was told they need orders.  Please review and advise (patient will be here for appointment tomorrow, 9:00am.  Patient's [phone] (Home)

## 2013-04-30 NOTE — Telephone Encounter (Signed)
[facility] and spoke with [facility], and was given Intake # [id number]. She advised they sent the information to [place]. I advised her that I had just spoken with [facility] and they did not have any information on Mr. Hagadorn. She called [facility] to verify the fax number, and re faxed the orders again. She asked me to call them back in 30 minutes to make sure they received the information.

## 2013-05-01 ENCOUNTER — Other Ambulatory Visit: Payer: Self-pay | Admitting: *Deleted

## 2013-05-01 ENCOUNTER — Ambulatory Visit (INDEPENDENT_AMBULATORY_CARE_PROVIDER_SITE_OTHER): Payer: Managed Care, Other (non HMO) | Admitting: Orthopedic Surgery

## 2013-05-01 ENCOUNTER — Encounter: Payer: Self-pay | Admitting: Orthopedic Surgery

## 2013-05-01 VITALS — BP 142/73 | Ht 68.0 in | Wt 214.0 lb

## 2013-05-01 DIAGNOSIS — Z96642 Presence of left artificial hip joint: Secondary | ICD-10-CM | POA: Insufficient documentation

## 2013-05-01 DIAGNOSIS — Z96649 Presence of unspecified artificial hip joint: Secondary | ICD-10-CM | POA: Insufficient documentation

## 2013-05-01 HISTORY — DX: Presence of unspecified artificial hip joint: Z96.649

## 2013-05-01 NOTE — Telephone Encounter (Signed)
Faxed order for outpatient therapy.

## 2013-05-01 NOTE — Progress Notes (Signed)
Patient ID: [name] [name], male   DOB: [birth year], 63 y.o.   MRN: [id number] Chief Complaint  Patient presents with  . Follow-up    Post op 1 Right THA DOS 04/20/13   BP 142/73  Ht 5\' 8"  (1.727 m)  Wt 214 lb (97.07 kg)  BMI 32.55 kg/m2  Encounter Diagnosis  Name Primary?  . Status post THR (total hip replacement) Yes    He essentially has no complaints his surgery went well. He is not receive physical therapy because of a delay with his insurance. His suture line looks good we took the staples out he has no signs of infection he has no swelling. Aspirin and TED hose for DVT prevention for 28 days postop  Return 4 weeks if no problems  PRE-OPERATIVE DIAGNOSIS:  Right Hip Osteoarthritis  POST-OPERATIVE DIAGNOSIS:  Right Hip Osteoarthritis  Findings: severe OA, shortening contracture, osteophytes, femoral head in varus with severe deformity   Stryker accolade II 127 degree stem with 35 neck  36 / -5 head , tritanium solid 56 shell , 0 36 poly   PROCEDURE:  Procedure(s): TOTAL HIP ARTHROPLASTY (Right)

## 2013-05-01 NOTE — Telephone Encounter (Signed)
05/01/13 - Patient called following his appointment in our office today; states he would like to proceed with out-patient therapy at [facility], [place], rather than waiting for his insurance to set up home therapy.  States he discussed with nurse today that he would call regarding the orders.

## 2013-05-01 NOTE — Patient Instructions (Signed)
Continue therapy

## 2013-05-01 NOTE — Telephone Encounter (Signed)
Routing to nurse

## 2013-05-03 ENCOUNTER — Ambulatory Visit: Payer: Managed Care, Other (non HMO) | Admitting: Orthopedic Surgery

## 2013-05-08 ENCOUNTER — Ambulatory Visit: Payer: Medicare Other | Admitting: Orthopedic Surgery

## 2013-05-08 ENCOUNTER — Ambulatory Visit: Payer: Managed Care, Other (non HMO) | Admitting: Orthopedic Surgery

## 2013-05-14 ENCOUNTER — Telehealth: Payer: Self-pay | Admitting: *Deleted

## 2013-05-14 ENCOUNTER — Telehealth: Payer: Self-pay | Admitting: Family Medicine

## 2013-05-14 NOTE — Telephone Encounter (Signed)
Patient called and was asking if you want him to continue taking the Gabapentin?  He also wanted to know when he could stop wearing the TED hose. He had Right THA on 04/20/13, and he states he is not really having any swelling. Please Advise.

## 2013-05-14 NOTE — Telephone Encounter (Signed)
Patient called back and he will contact Dr. Aline Brochure for refills.

## 2013-05-14 NOTE — Telephone Encounter (Signed)
Patient needs Rx for gabapentin to [facility] .[name]   Also, would like another Rx to take to a pharmacy because he is completely out and cannot wait on [facility] to mail.

## 2013-05-14 NOTE — Telephone Encounter (Signed)
[facility] (voicemail not setup) on hm and cell #. Dr. Aline Brochure prescribed this medication on 03/29/13 with 2 refills according to Epic. Patient will need to contact the doctor that prescribed this medication.

## 2013-05-15 NOTE — Telephone Encounter (Signed)
Ted hose stop  Gabapentin yes that's for nerve pain

## 2013-05-16 NOTE — Telephone Encounter (Signed)
Called in Gabapentin 100 mg #42 no refills to [facility] to last until patient could get mail order, and called in Gabapentin 100 mg ninety day supply no refills to [facility], due to it being more cost effective for the patient.

## 2013-05-29 ENCOUNTER — Ambulatory Visit (INDEPENDENT_AMBULATORY_CARE_PROVIDER_SITE_OTHER): Payer: Self-pay | Admitting: Orthopedic Surgery

## 2013-05-29 ENCOUNTER — Encounter: Payer: Self-pay | Admitting: Orthopedic Surgery

## 2013-05-29 VITALS — BP 112/65 | Ht 68.0 in | Wt 214.0 lb

## 2013-05-29 DIAGNOSIS — IMO0002 Reserved for concepts with insufficient information to code with codable children: Secondary | ICD-10-CM

## 2013-05-29 DIAGNOSIS — Z96649 Presence of unspecified artificial hip joint: Secondary | ICD-10-CM

## 2013-05-29 DIAGNOSIS — M1611 Unilateral primary osteoarthritis, right hip: Secondary | ICD-10-CM

## 2013-05-29 DIAGNOSIS — M169 Osteoarthritis of hip, unspecified: Secondary | ICD-10-CM

## 2013-05-29 DIAGNOSIS — M1710 Unilateral primary osteoarthritis, unspecified knee: Secondary | ICD-10-CM

## 2013-05-29 DIAGNOSIS — M1711 Unilateral primary osteoarthritis, right knee: Secondary | ICD-10-CM

## 2013-05-29 DIAGNOSIS — M549 Dorsalgia, unspecified: Secondary | ICD-10-CM

## 2013-05-29 DIAGNOSIS — M1610 Unilateral primary osteoarthritis, unspecified hip: Secondary | ICD-10-CM

## 2013-05-29 NOTE — Progress Notes (Signed)
Patient ID: [name] [name], male   DOB: [birth year], 63 y.o.   MRN: [id number] Chief Complaint  Patient presents with  . Follow-up    Post op 2 right THA DOS 04/20/13    This is the six-week postop visit status post right total hip via direct lateral approach, no major pain complaints at this point.  Patient is doing well. Therapy at hand and rehabilitation  His difficulties at this time include lack of confidence in putting all of his weight on his right leg. He is invalid per with a walker and primarily uses a cane at this time  He says he is having some difficulty with rotation and we did note him to have an external rotation contracture at the time of surgery  His hip flexion as well over 120 he does have external rotation almost equal to his opposite side so the goal here is not much more than that  Leg lengths are equal. There is a flexion contracture of the knee and there are lumbar spine abnormalities as well.  He may need a shoe lift once everything sort of settles out  Return in 4 weeks  It's okay to drive

## 2013-05-29 NOTE — Patient Instructions (Signed)
You can stop aspirin You can start driving

## 2013-06-06 ENCOUNTER — Telehealth: Payer: Self-pay | Admitting: Orthopedic Surgery

## 2013-06-06 NOTE — Telephone Encounter (Signed)
Faxed order for cane to [facility]

## 2013-06-06 NOTE — Telephone Encounter (Signed)
Consepcion Hearing said physical therapy wants him  to give up the walker and start using a cane. He asked for an order for a single prong cane to be faxed to [facility]

## 2013-06-21 ENCOUNTER — Telehealth: Payer: Self-pay | Admitting: Family Medicine

## 2013-06-21 NOTE — Telephone Encounter (Signed)
Discussed with patient. He has appt Monday and we will do A1C then.

## 2013-06-21 NOTE — Telephone Encounter (Signed)
Usually we don't rep all this just three mo later anything specifically? We can do A1c here

## 2013-06-21 NOTE — Telephone Encounter (Signed)
Patient needs order for blood work. He has an appointment on Monday and he would like to have his BW done hopefully tomorrow so he can talk about his labs at that visit.

## 2013-06-21 NOTE — Telephone Encounter (Signed)
Labs on 12/29: Lip, met7, A1C, urine microprotein, TSH

## 2013-06-25 ENCOUNTER — Ambulatory Visit (INDEPENDENT_AMBULATORY_CARE_PROVIDER_SITE_OTHER): Payer: Medicare Other | Admitting: Family Medicine

## 2013-06-25 ENCOUNTER — Encounter: Payer: Self-pay | Admitting: Family Medicine

## 2013-06-25 VITALS — BP 148/82 | Ht 68.0 in | Wt 213.0 lb

## 2013-06-25 DIAGNOSIS — R059 Cough, unspecified: Secondary | ICD-10-CM

## 2013-06-25 DIAGNOSIS — R05 Cough: Secondary | ICD-10-CM

## 2013-06-25 DIAGNOSIS — R7303 Prediabetes: Secondary | ICD-10-CM

## 2013-06-25 DIAGNOSIS — R7309 Other abnormal glucose: Secondary | ICD-10-CM

## 2013-06-25 LAB — POCT GLYCOSYLATED HEMOGLOBIN (HGB A1C): Hemoglobin A1C: 5.1

## 2013-06-25 NOTE — Progress Notes (Signed)
   Subjective:    Patient ID: [name] [name], male    DOB: Jan 11, 1951, 63 y.o.   MRN: [id number]  HPI Patient is here today for a check up on his prediabetes.  He had right hip replacement surgery on 04/20/13  A1C today was 5.1 he does watch his diet he does try to stay as active as he can since he's had recent surgery  Also c/o productive cough that comes and goes. Wants to discuss COPD. he smoked for 6-8 years then he quit back in the 80s as not had any since then his x-ray does show some slight changes but not severe he denies feeling short of breath any he states the cough is gradually getting better as these got over this bronchitis. We did discuss about the possibility of doing pulmonary function testing.    Review of Systems  Constitutional: Negative for activity change, appetite change and fatigue.  HENT: Negative for congestion.   Respiratory: Positive for cough. Negative for shortness of breath.   Cardiovascular: Negative for chest pain.  Gastrointestinal: Negative for abdominal pain.  Endocrine: Negative for polydipsia and polyphagia.  Neurological: Negative for weakness.  Psychiatric/Behavioral: Negative for confusion.       Objective:   Physical Exam  Vitals reviewed. Constitutional: He appears well-nourished. No distress.  Cardiovascular: Normal rate, regular rhythm and normal heart sounds.   No murmur heard. Pulmonary/Chest: Effort normal and breath sounds normal. No respiratory distress.  Musculoskeletal: He exhibits no edema.  Lymphadenopathy:    He has no cervical adenopathy.  Neurological: He is alert.  Psychiatric: His behavior is normal.          Assessment & Plan:  #1 diabetes/prediabetes-excellent A1c watch diet continue physical activity  #2 HTN good control today on recheck 128/86 #3 recent hip surgery getting over this. Uses pain medicine rare if ever.  will need comprehensive lab work in the fall. PSA is followed by specialist.  Cough-this  is gradually getting better he would like to just watch this he does not want to be going for any testing of is not gone away by the time he follows up 5 months we'll do pulmonary function tests he will notify us if he gets worse

## 2013-07-03 ENCOUNTER — Ambulatory Visit (INDEPENDENT_AMBULATORY_CARE_PROVIDER_SITE_OTHER): Payer: Self-pay | Admitting: Orthopedic Surgery

## 2013-07-03 ENCOUNTER — Encounter: Payer: Self-pay | Admitting: Orthopedic Surgery

## 2013-07-03 VITALS — BP 172/97 | Ht 68.0 in | Wt 213.0 lb

## 2013-07-03 DIAGNOSIS — Z96649 Presence of unspecified artificial hip joint: Secondary | ICD-10-CM

## 2013-07-03 MED ORDER — ETODOLAC 200 MG PO CAPS: 200.0000 mg | ORAL_CAPSULE | Freq: Every day | ORAL | Status: DC

## 2013-07-03 NOTE — Patient Instructions (Addendum)
RTW 5/4//15 Taper off Gabapentin Continue Therapy

## 2013-07-03 NOTE — Progress Notes (Signed)
Patient ID: [name] [name], male   DOB: [birth year], 63 y.o.   MRN: [id number]  Chief Complaint  Patient presents with  . Follow-up    Follow up right hip s/p THA DOS 04/20/13    He is just about date 3 months. For his surgery he does have some weakness in his right leg no pain in his right hip he appears to have a limp which I expect is from abductor weakness. He is straight on terms of his leg lengths  He got to return to work May 4 I agree he should continue strengthening I agree to followup May 28. He will continue on a etodolac for pain in his knee  No back symptoms at this time  Encounter Diagnosis  Name Primary?  . Status post hip replacement Yes

## 2013-07-20 ENCOUNTER — Ambulatory Visit (INDEPENDENT_AMBULATORY_CARE_PROVIDER_SITE_OTHER): Payer: Medicare Other | Admitting: Urology

## 2013-07-20 DIAGNOSIS — E291 Testicular hypofunction: Secondary | ICD-10-CM

## 2013-07-20 DIAGNOSIS — Z8546 Personal history of malignant neoplasm of prostate: Secondary | ICD-10-CM

## 2013-07-20 DIAGNOSIS — N529 Male erectile dysfunction, unspecified: Secondary | ICD-10-CM

## 2013-08-05 ENCOUNTER — Other Ambulatory Visit: Payer: Self-pay | Admitting: Family Medicine

## 2013-08-16 ENCOUNTER — Ambulatory Visit (INDEPENDENT_AMBULATORY_CARE_PROVIDER_SITE_OTHER): Payer: Medicare Other | Admitting: Orthopedic Surgery

## 2013-08-16 ENCOUNTER — Encounter: Payer: Self-pay | Admitting: Orthopedic Surgery

## 2013-08-16 VITALS — BP 175/88 | Ht 68.0 in | Wt 213.0 lb

## 2013-08-16 DIAGNOSIS — Z96649 Presence of unspecified artificial hip joint: Secondary | ICD-10-CM

## 2013-08-16 NOTE — Patient Instructions (Signed)
Continue therapy

## 2013-08-16 NOTE — Progress Notes (Signed)
Patient ID: [name] [name], male   DOB: 1950/04/22, 63 y.o.   MRN: [id number]  Chief Complaint  Patient presents with  . Follow-up    6 week recheck right THA DOS 04/20/13    This is the five-month followup status post right total hip doing well without pain still has weakness and a slight limp which is probably an abductor related limp. No knee pain at this time under review of systems  He has weakness with hip abduction and would benefit from therapy to strengthen his hip improve his gait mechanics and improve his balance   his gait is supported by a cane he has excellent hip range of motion his hip was very stable.General appearance is normal, the patient is alert and oriented x3 with normal mood and affect. BP 175/88  Ht 5\' 8"  (1.727 m)  Wt 213 lb (96.616 kg)  BMI 32.39 kg/m2  Status post hip replacement osteoarthritis of the right hip  Osteoarthritis right knee stable  Recheck 3 months after therapy  Orders Placed This Encounter  Procedures  . Ambulatory referral to Physical Therapy

## 2013-09-07 ENCOUNTER — Telehealth: Payer: Self-pay | Admitting: Family Medicine

## 2013-09-07 MED ORDER — ALLOPURINOL 100 MG PO TABS: ORAL_TABLET | ORAL | Status: DC

## 2013-09-07 NOTE — Telephone Encounter (Signed)
Med sent to [facility]. Left message on voicemail notifying patient.

## 2013-09-07 NOTE — Telephone Encounter (Signed)
Pt needs hand written script for this med to start getting them at [facility]  allopurinol (ZYLOPRIM) 100 MG tablet  Call in if you can or pt will pick it up

## 2013-09-25 ENCOUNTER — Telehealth: Payer: Self-pay | Admitting: Family Medicine

## 2013-09-25 NOTE — Telephone Encounter (Signed)
In this situation seen ENT makes most sense, he can try to set up appointment. If they tell him he needs referral I would recommend we see him within the next few days and we can help set up a referral after we see him

## 2013-09-25 NOTE — Telephone Encounter (Signed)
Patient says that for the past month, he feels like he has something in his throat when he swallows. It is right near his adam's apple he feels. He wants to know if Dr. [name] suggests he come in for an appointment or go see an ENT?

## 2013-09-25 NOTE — Telephone Encounter (Signed)
Discussed with patient. Patient verbalized understanding. 

## 2013-10-04 ENCOUNTER — Other Ambulatory Visit: Payer: Self-pay | Admitting: *Deleted

## 2013-10-04 ENCOUNTER — Telehealth: Payer: Self-pay

## 2013-10-04 ENCOUNTER — Telehealth: Payer: Self-pay | Admitting: Family Medicine

## 2013-10-04 MED ORDER — PRAVASTATIN SODIUM 80 MG PO TABS: 80.0000 mg | ORAL_TABLET | Freq: Every day | ORAL | Status: DC

## 2013-10-04 NOTE — Telephone Encounter (Signed)
Patient needs a prescription for pravastatin 80mg  called into [facility]

## 2013-10-04 NOTE — Telephone Encounter (Signed)
Med sent to pharm. Tried to call voicemail not set up.

## 2013-10-04 NOTE — Telephone Encounter (Signed)
Pt left Vm that Dr. Sallee Lange was referring him for screening colonoscopy. He can be reached at [phone].

## 2013-10-08 ENCOUNTER — Other Ambulatory Visit: Payer: Self-pay

## 2013-10-08 DIAGNOSIS — Z1211 Encounter for screening for malignant neoplasm of colon: Secondary | ICD-10-CM

## 2013-10-08 NOTE — Telephone Encounter (Signed)
Gastroenterology Pre-Procedure Review  Request Date: 10/22/2013 Requesting Physician: Dr. Sallee Lange  PATIENT REVIEW QUESTIONS: The patient responded to the following health history questions as indicated:    PT HAD A COLONOSCOPY IN [place] APPROXIMATELY 10 YEARS AGO WITH NO POLYPS  PT DRINKS 1-2 BEERS DAILY  1. Diabetes Melitis: no 2. Joint replacements in the past 12 months: Right hip replacement in Feb 2015.  3. Major health problems in the past 3 months: no 4. Has an artificial valve or MVP: no 5. Has a defibrillator: no 6. Has been advised in past to take antibiotics in advance of a procedure like teeth cleaning: no    MEDICATIONS & ALLERGIES:    Patient reports the following regarding taking any blood thinners:   Plavix? no Aspirin? no Coumadin? no  Patient confirms/reports the following medications:  Current Outpatient Prescriptions  Medication Sig Dispense Refill  . allopurinol (ZYLOPRIM) 100 MG tablet TAKE 1 TABLET DAILY  90 tablet  1  . B Complex-C (SUPER B COMPLEX PO) Take 1 tablet by mouth daily.      [name] lisinopril (ZESTRIL) 2.5 MG tablet Take 1 tablet (2.5 mg total) by mouth daily.  90 tablet  2  . Multiple Vitamin (MULTIVITAMIN) tablet Take 1 tablet by mouth daily.       . naproxen sodium (ANAPROX) 220 MG tablet Take 220 mg by mouth at bedtime.      . Omega-3 Fatty Acids (FISH OIL) 1200 MG CAPS Take 1 capsule by mouth daily.       . pravastatin (PRAVACHOL) 80 MG tablet Take 1 tablet (80 mg total) by mouth daily.  90 tablet  1  . vitamin E 400 UNIT capsule Take 400 Units by mouth daily.       [name] aspirin EC 325 MG EC tablet Take 1 tablet (325 mg total) by mouth daily with breakfast.  60 tablet  0  . AXIRON 30 MG/ACT SOLN Apply 1 application topically daily.       . Calcium-Magnesium-Zinc 1000-500-50 MG TABS Take 1 tablet by mouth daily.       . citalopram (CELEXA) 20 MG tablet       . etodolac (LODINE) 200 MG capsule Take 1 capsule (200 mg total) by mouth at bedtime.   30 capsule  5  . gabapentin (NEURONTIN) 100 MG capsule Take 1 capsule (100 mg total) by mouth 3 (three) times daily.  90 capsule  2  . HYDROcodone-acetaminophen (NORCO) 7.5-325 MG per tablet Take 1 tablet by mouth every 4 (four) hours.  90 tablet  0  . polyethylene glycol (MIRALAX / GLYCOLAX) packet Take 17 g by mouth daily as needed for mild constipation.  14 each  0   No current facility-administered medications for this visit.    Patient confirms/reports the following allergies:  Allergies  Allergen Reactions  . Lipitor [Atorvastatin]     Myalgia     No orders of the defined types were placed in this encounter.    AUTHORIZATION INFORMATION Primary Insurance: ID #:  Group #:  Pre-Cert / Auth required:  Pre-Cert / Auth #:   Secondary Insurance:   ID #:  Group #:  Pre-Cert / Auth required:  Pre-Cert / Auth #:   SCHEDULE INFORMATION: Procedure has been scheduled as follows:  Date:   10/22/2013                Time: 11:15 am  Location: [facility] Short Stay  This Gastroenterology Pre-Precedure Review Form is  being routed to the following provider(s): Barney Drain, MD

## 2013-10-10 ENCOUNTER — Encounter (HOSPITAL_COMMUNITY): Payer: Self-pay | Admitting: Pharmacy Technician

## 2013-10-19 MED ORDER — PEG-KCL-NACL-NASULF-NA ASC-C 100 G PO SOLR: 1.0000 | ORAL | Status: DC

## 2013-10-19 NOTE — Telephone Encounter (Signed)
MOVI PREP SPLIT DOSING- CLEAR LIQUIDS WITH BREAKFAST.  

## 2013-10-19 NOTE — Telephone Encounter (Signed)
Rx sent to the pharmacy and instructions faxed to [facility]. Pt was aware the instructions would be faxed.

## 2013-10-22 ENCOUNTER — Encounter (HOSPITAL_COMMUNITY): Payer: Self-pay | Admitting: *Deleted

## 2013-10-22 ENCOUNTER — Encounter (HOSPITAL_COMMUNITY)
Admission: RE | Disposition: A | Discharge status: Home / Self Care | Payer: Self-pay | Source: Ambulatory Visit | Attending: Gastroenterology

## 2013-10-22 ENCOUNTER — Ambulatory Visit (HOSPITAL_COMMUNITY)
Admission: RE | Admit: 2013-10-22 | Discharge: 2013-10-22 | Disposition: A | Discharge status: Home / Self Care | Payer: Medicare Other | Source: Ambulatory Visit | Attending: Gastroenterology | Admitting: Gastroenterology

## 2013-10-22 DIAGNOSIS — Q438 Other specified congenital malformations of intestine: Secondary | ICD-10-CM | POA: Diagnosis not present

## 2013-10-22 DIAGNOSIS — K5731 Diverticulosis of large intestine without perforation or abscess with bleeding: Secondary | ICD-10-CM | POA: Insufficient documentation

## 2013-10-22 DIAGNOSIS — Z1211 Encounter for screening for malignant neoplasm of colon: Secondary | ICD-10-CM | POA: Diagnosis present

## 2013-10-22 HISTORY — DX: Gastro-esophageal reflux disease without esophagitis: K21.9

## 2013-10-22 HISTORY — PX: COLONOSCOPY: SHX5424

## 2013-10-22 SURGERY — COLONOSCOPY
Anesthesia: Moderate Sedation

## 2013-10-22 MED ORDER — SODIUM CHLORIDE 0.9 % IV SOLN
INTRAVENOUS | Status: DC
  Administered 2013-10-22: 11:00:00 via INTRAVENOUS

## 2013-10-22 MED ORDER — STERILE WATER FOR IRRIGATION IR SOLN
Status: DC | PRN
  Administered 2013-10-22: 12:00:00

## 2013-10-22 MED ORDER — MEPERIDINE HCL 100 MG/ML IJ SOLN
INTRAMUSCULAR | Status: AC
  Filled 2013-10-22: qty 2

## 2013-10-22 MED ORDER — MIDAZOLAM HCL 5 MG/5ML IJ SOLN
INTRAMUSCULAR | Status: DC | PRN
  Administered 2013-10-22: 1 mg via INTRAVENOUS
  Administered 2013-10-22 (×2): 2 mg via INTRAVENOUS

## 2013-10-22 MED ORDER — MEPERIDINE HCL 100 MG/ML IJ SOLN
INTRAMUSCULAR | Status: DC | PRN
  Administered 2013-10-22 (×3): 25 mg via INTRAVENOUS

## 2013-10-22 MED ORDER — MIDAZOLAM HCL 5 MG/5ML IJ SOLN
INTRAMUSCULAR | Status: AC
  Filled 2013-10-22: qty 10

## 2013-10-22 NOTE — H&P (Signed)
Primary Care Physician:  Sallee Lange, MD Primary Gastroenterologist:  Dr. Oneida Alar  Pre-Procedure History & Physical: HPI:  [name] [name] is a 63 y.o. male here for [place].  Past Medical History  Diagnosis Date  . Chronic kidney disease     prostate cancer   . Arthritis     right knee and hip   . Cancer     prostate cancer   . Hyperlipidemia   . Hyperglycemia   . Gout   . Hypertension   . Hypercholesteremia   . Anxiety   . Diabetes mellitus without complication   . Gout     Past Surgical History  Procedure Laterality Date  . Hernia repair      right inguinal hernia repair   . Other surgical history      deviated septum surgery   . Robot assisted laparoscopic radical prostatectomy  04/20/2011    Procedure: ROBOTIC ASSISTED LAPAROSCOPIC RADICAL PROSTATECTOMY;  Surgeon: Malka So, MD;  Location: [facility] ORS;  Service: Urology;  Laterality: N/A;  Bilateral lymph node dissection  . Colonoscopy    . Nasal septum surgery    . Total hip arthroplasty Right 04/20/2013    Procedure: TOTAL HIP ARTHROPLASTY;  Surgeon: Carole Civil, MD;  Location: [facility] ORS;  Service: Orthopedics;  Laterality: Right;    Prior to Admission medications   Medication Sig Start Date End Date Taking? Authorizing Provider  AXIRON 30 MG/ACT SOLN Apply 1 application topically daily.  07/14/12  Yes Historical Provider, MD  B Complex-C (SUPER B COMPLEX PO) Take 1 tablet by mouth daily.   Yes Historical Provider, MD  Calcium-Magnesium-Zinc 1000-500-50 MG TABS Take 1 tablet by mouth daily.    Yes Historical Provider, MD  colchicine 0.6 MG tablet Take 0.6 mg by mouth daily.   Yes Historical Provider, MD  lisinopril (ZESTRIL) 2.5 MG tablet Take 1 tablet (2.5 mg total) by mouth daily. 03/26/13 03/26/14 Yes Kathyrn Drown, MD  Multiple Vitamin (MULTIVITAMIN) tablet Take 1 tablet by mouth daily.    Yes Historical Provider, MD  naproxen sodium (ANAPROX) 220 MG tablet Take 220 mg by mouth at bedtime.   Yes  Historical Provider, MD  Omega-3 Fatty Acids (FISH OIL) 1200 MG CAPS Take 1 capsule by mouth daily.    Yes Historical Provider, MD  pravastatin (PRAVACHOL) 80 MG tablet Take 1 tablet (80 mg total) by mouth daily. 10/04/13  Yes Kathyrn Drown, MD  vitamin E 400 UNIT capsule Take 400 Units by mouth daily.    Yes Historical Provider, MD  aspirin EC 325 MG EC tablet Take 1 tablet (325 mg total) by mouth daily with breakfast. 04/23/13   Carole Civil, MD  peg 3350 powder (MOVIPREP) 100 G SOLR Take 1 kit (200 g total) by mouth as directed. 10/19/13   Danie Binder, MD    Allergies as of 10/08/2013 - Review Complete 10/08/2013  Allergen Reaction Noted  . Lipitor [atorvastatin]  08/16/2012    Family History  Problem Relation Age of Onset  . Diabetes Mother   . Hypertension Mother   . Hypertension Brother     History   Social History  . Marital Status: Single    Spouse Name: N/A    Number of Children: N/A  . Years of Education: N/A   Occupational History  . Not on file.   Social History Main Topics  . Smoking status: Former Smoker -- 0.50 packs/day for 8 years    Types: Cigarettes  Quit date: 03/22/1984  . Smokeless tobacco: Never Used  . Alcohol Use: Yes     Comment: occasional   . Drug Use: No  . Sexual Activity: Yes    Birth Control/ Protection: None   Other Topics Concern  . Not on file   Social History Narrative  . No narrative on file    Review of Systems: See HPI, otherwise negative ROS   Physical Exam: There were no vitals taken for this visit. General:   Alert,  pleasant and cooperative in NAD Head:  Normocephalic and atraumatic. Neck:  Supple; Lungs:  Clear throughout to auscultation.    Heart:  Regular rate and rhythm. Abdomen:  Soft, nontender and nondistended. Normal bowel sounds, without guarding, and without rebound.   Neurologic:  Alert and  oriented x4;  grossly normal neurologically.  Impression/Plan:     SCREENING  Plan:  1. TCS  TODAY

## 2013-10-22 NOTE — Discharge Instructions (Signed)
You have small internal hemorrhoids and diverticulosis IN YOUR RIGHT AND LEFT COLON.[name]   Next colonoscopy in 10 years.   FOLLOW A HIGH FIBER DIET. AVOID ITEMS THAT CAUSE BLOATING & GAS.  See info below.  Colonoscopy Care After Read the instructions outlined below and refer to this sheet in the next week. These discharge instructions provide you with general information on caring for yourself after you leave the hospital. While your treatment has been planned according to the most current medical practices available, unavoidable complications occasionally occur. If you have any problems or questions after discharge, call DR. Mayte Diers, [phone].  ACTIVITY  You may resume your regular activity, but move at a slower pace for the next 24 hours.   Take frequent rest periods for the next 24 hours.   Walking will help get rid of the air and reduce the bloated feeling in your belly (abdomen).   No driving for 24 hours (because of the medicine (anesthesia) used during the test).   You may shower.   Do not sign any important legal documents or operate any machinery for 24 hours (because of the anesthesia used during the test).    NUTRITION  Drink plenty of fluids.   You may resume your normal diet as instructed by your doctor.   Begin with a light meal and progress to your normal diet. Heavy or fried foods are harder to digest and may make you feel sick to your stomach (nauseated).   Avoid alcoholic beverages for 24 hours or as instructed.    MEDICATIONS  You may resume your normal medications.   WHAT YOU CAN EXPECT TODAY  Some feelings of bloating in the abdomen.   Passage of more gas than usual.   Spotting of blood in your stool or on the toilet paper  .  IF YOU HAD POLYPS REMOVED DURING THE COLONOSCOPY:  Eat a soft diet IF YOU HAVE NAUSEA, BLOATING, ABDOMINAL PAIN, OR VOMITING.    FINDING OUT THE RESULTS OF YOUR TEST Not all test results are available during your  visit. DR. Oneida Alar WILL CALL YOU WITHIN 7 DAYS OF YOUR PROCEDUE WITH YOUR RESULTS. Do not assume everything is normal if you have not heard from DR. Akesha Uresti IN ONE WEEK, CALL HER OFFICE AT [phone].  SEEK IMMEDIATE MEDICAL ATTENTION AND CALL THE OFFICE: [phone] IF:  You have more than a spotting of blood in your stool.   Your belly is swollen (abdominal distention).   You are nauseated or vomiting.   You have a temperature over 101F.   You have abdominal pain or discomfort that is severe or gets worse throughout the day.  High-Fiber Diet A high-fiber diet changes your normal diet to include more whole grains, legumes, fruits, and vegetables. Changes in the diet involve replacing refined carbohydrates with unrefined foods. The calorie level of the diet is essentially unchanged. The Dietary Reference Intake (recommended amount) for adult males is 38 grams per day. For adult females, it is 25 grams per day. Pregnant and lactating women should consume 28 grams of fiber per day. Fiber is the intact part of a plant that is not broken down during digestion. Functional fiber is fiber that has been isolated from the plant to provide a beneficial effect in the body. PURPOSE  Increase stool bulk.   Ease and regulate bowel movements.   Lower cholesterol.  INDICATIONS THAT YOU NEED MORE FIBER  Constipation and hemorrhoids.   Uncomplicated diverticulosis (intestine condition) and irritable bowel syndrome.  Weight management.   As a protective measure against hardening of the arteries (atherosclerosis), diabetes, and cancer.   GUIDELINES FOR INCREASING FIBER IN THE DIET  Start adding fiber to the diet slowly. A gradual increase of about 5 more grams (2 slices of whole-wheat bread, 2 servings of most fruits or vegetables, or 1 bowl of high-fiber cereal) per day is best. Too rapid an increase in fiber may result in constipation, flatulence, and bloating.   Drink enough water and fluids  to keep your urine clear or pale yellow. Water, juice, or caffeine-free drinks are recommended. Not drinking enough fluid may cause constipation.   Eat a variety of high-fiber foods rather than one type of fiber.   Try to increase your intake of fiber through using high-fiber foods rather than fiber pills or supplements that contain small amounts of fiber.   The goal is to change the types of food eaten. Do not supplement your present diet with high-fiber foods, but replace foods in your present diet.  INCLUDE A VARIETY OF FIBER SOURCES  Replace refined and processed grains with whole grains, canned fruits with fresh fruits, and incorporate other fiber sources. White rice, white breads, and most bakery goods contain little or no fiber.   Brown whole-grain rice, buckwheat oats, and many fruits and vegetables are all good sources of fiber. These include: broccoli, Brussels sprouts, cabbage, cauliflower, beets, sweet potatoes, white potatoes (skin on), carrots, tomatoes, eggplant, squash, berries, fresh fruits, and dried fruits.   Cereals appear to be the richest source of fiber. Cereal fiber is found in whole grains and bran. Bran is the fiber-rich outer coat of cereal grain, which is largely removed in refining. In whole-grain cereals, the bran remains. In breakfast cereals, the largest amount of fiber is found in those with "bran" in their names. The fiber content is sometimes indicated on the label.   You may need to include additional fruits and vegetables each day.   In baking, for 1 cup white flour, you may use the following substitutions:   1 cup whole-wheat flour minus 2 tablespoons.   1/2 cup white flour plus 1/2 cup whole-wheat flour.   Diverticulosis Diverticulosis is a common condition that develops when small pouches (diverticula) form in the wall of the colon. The risk of diverticulosis increases with age. It happens more often in people who eat a low-fiber diet. Most individuals  with diverticulosis have no symptoms. Those individuals with symptoms usually experience belly (abdominal) pain, constipation, or loose stools (diarrhea).  HOME CARE INSTRUCTIONS  Increase the amount of fiber in your diet as directed by your caregiver or dietician. This may reduce symptoms of diverticulosis.   Drink at least 6 to 8 glasses of water each day to prevent constipation.   Try not to strain when you have a bowel movement.   Avoiding nuts and seeds to prevent complications is still an uncertain benefit.       FOODS HAVING HIGH FIBER CONTENT INCLUDE:  Fruits. Apple, peach, pear, tangerine, raisins, prunes.   Vegetables. Brussels sprouts, asparagus, broccoli, cabbage, carrot, cauliflower, romaine lettuce, spinach, summer squash, tomato, winter squash, zucchini.   Starchy Vegetables. Baked beans, kidney beans, lima beans, split peas, lentils, potatoes (with skin).   Grains. Whole wheat bread, brown rice, bran flake cereal, plain oatmeal, white rice, shredded wheat, bran muffins.    SEEK IMMEDIATE MEDICAL CARE IF:  You develop increasing pain or severe bloating.   You have an oral temperature above 101F.  You develop vomiting or bowel movements that are bloody or black.   Hemorrhoids Hemorrhoids are dilated (enlarged) veins around the rectum. Sometimes clots will form in the veins. This makes them swollen and painful. These are called thrombosed hemorrhoids. Causes of hemorrhoids include:  Constipation.   Straining to have a bowel movement.   HEAVY LIFTING HOME CARE INSTRUCTIONS  Eat a well balanced diet and drink 6 to 8 glasses of water every day to avoid constipation. You may also use a bulk laxative.   Avoid straining to have bowel movements.   Keep anal area dry and clean.   Do not use a donut shaped pillow or sit on the toilet for long periods. This increases blood pooling and pain.   Move your bowels when your body has the urge; this will require less  straining and will decrease pain and pressure.

## 2013-10-22 NOTE — Op Note (Signed)
[facility] [address] [place], [address]   COLONOSCOPY PROCEDURE REPORT  PATIENT: [name] [name], [name] [name]  MR#: [id number] BIRTHDATE: 09-Nov-1950 , 55  yrs. old GENDER: Male ENDOSCOPIST: Barney Drain, MD REFERRED JH:ERDEY Wolfgang Phoenix, M.D. PROCEDURE DATE:  10/22/2013 PROCEDURE:   Colonoscopy, screening /ENDOCUFF INDICATIONS:Average risk patient for colon cancer. MEDICATIONS: Demerol 75 mg IV and Versed 5 mg IV  DESCRIPTION OF PROCEDURE:    Physical exam was performed.  Informed consent was obtained from the patient after explaining the benefits, risks, and alternatives to procedure.  The patient was connected to monitor and placed in left lateral position. Continuous oxygen was provided by nasal cannula and IV medicine administered through an indwelling cannula.  After administration of sedation and rectal exam, the patients rectum was intubated and the EC-3890Li ([id number]) and EC-3890Li ([id number])  colonoscope was advanced under direct visualization to the ileum.  The scope was removed slowly by carefully examining the color, texture, anatomy, and integrity mucosa on the way out.  The patient was recovered in endoscopy and discharged home in satisfactory condition.    COLON FINDINGS: The mucosa appeared normal in the terminal ileum.  , There was moderate diverticulosis noted throughout the entire examined colon with associated muscular hypertrophy and tortuosity. REDUNDANT SIGMOID COLON. The colon mucosa was otherwise normal.  , and Small internal hemorrhoids were found.  PREP QUALITY: good.  CECAL W/D TIME: 12 minutes     COMPLICATIONS: None  ENDOSCOPIC IMPRESSION: 1.   Moderate diverticulosis  throughout the entire examined colon 2.   Small internal hemorrhoids   RECOMMENDATIONS: HIGH FIBER DIET TCS IN 10 YEARS       _______________________________ eSignedBarney Drain, MD 10/22/2013 12:24 PM

## 2013-10-25 ENCOUNTER — Encounter (HOSPITAL_COMMUNITY): Payer: Self-pay | Admitting: Gastroenterology

## 2013-11-14 ENCOUNTER — Ambulatory Visit (INDEPENDENT_AMBULATORY_CARE_PROVIDER_SITE_OTHER): Payer: Medicare Other | Admitting: Orthopedic Surgery

## 2013-11-14 ENCOUNTER — Encounter: Payer: Self-pay | Admitting: Orthopedic Surgery

## 2013-11-14 VITALS — BP 166/88 | Ht 68.0 in | Wt 215.0 lb

## 2013-11-14 DIAGNOSIS — Z96649 Presence of unspecified artificial hip joint: Secondary | ICD-10-CM

## 2013-11-14 DIAGNOSIS — M545 Low back pain, unspecified: Secondary | ICD-10-CM

## 2013-11-14 DIAGNOSIS — M1610 Unilateral primary osteoarthritis, unspecified hip: Secondary | ICD-10-CM

## 2013-11-14 DIAGNOSIS — M1611 Unilateral primary osteoarthritis, right hip: Secondary | ICD-10-CM

## 2013-11-14 NOTE — Progress Notes (Signed)
Chief Complaint  Patient presents with  . Follow-up    follow up Right hip THA, DOS 04/20/13   BP 166/88  Ht 5\' 8"  (1.727 m)  Wt 215 lb (97.523 kg)  BMI 32.70 kg/m2  The patient says he has no problems in terms of pain with his hip and he is not having any ambulation disturbances  Review of systems knee not bothering him no back pain at this time  Hip flexion excellent hip stability excellent motor exam normal skin intact wound clean no tenderness normal gait normal mood normal pulse and perfusion  Postop total hip 7 months doing well followup in one years time from surgery for x-ray

## 2013-11-15 ENCOUNTER — Ambulatory Visit: Payer: Medicare Other | Admitting: Orthopedic Surgery

## 2013-11-27 ENCOUNTER — Telehealth: Payer: Self-pay | Admitting: Family Medicine

## 2013-11-27 DIAGNOSIS — R7303 Prediabetes: Secondary | ICD-10-CM

## 2013-11-27 DIAGNOSIS — R809 Proteinuria, unspecified: Secondary | ICD-10-CM

## 2013-11-27 DIAGNOSIS — Z79899 Other long term (current) drug therapy: Secondary | ICD-10-CM

## 2013-11-27 DIAGNOSIS — E785 Hyperlipidemia, unspecified: Secondary | ICD-10-CM

## 2013-11-27 NOTE — Telephone Encounter (Signed)
Pt would like bw orders for appt on 9/21   Call when ready

## 2013-11-27 NOTE — Telephone Encounter (Signed)
Lipid, liver, hemoglobin A1c, urine marker protein. Also CBC. Confirm with the patient today his PSA is followed by a specialist.

## 2013-11-28 NOTE — Telephone Encounter (Signed)
Patient states that Dr. Jeffie Pollock handles his  PSA. Blood work orders placed in [facility]. Patient notified.

## 2013-12-05 LAB — CBC WITH DIFFERENTIAL/PLATELET
Basophils Absolute: 0.1 10*3/uL (ref 0.0–0.1)
Basophils Relative: 1 % (ref 0–1)
Eosinophils Absolute: 0.2 10*3/uL (ref 0.0–0.7)
Eosinophils Relative: 3 % (ref 0–5)
HCT: 42.5 % (ref 39.0–52.0)
HEMOGLOBIN: 14.8 g/dL (ref 13.0–17.0)
LYMPHS ABS: 1.6 10*3/uL (ref 0.7–4.0)
LYMPHS PCT: 27 % (ref 12–46)
MCH: 29.8 pg (ref 26.0–34.0)
MCHC: 34.8 g/dL (ref 30.0–36.0)
MCV: 85.5 fL (ref 78.0–100.0)
MONOS PCT: 8 % (ref 3–12)
Monocytes Absolute: 0.5 10*3/uL (ref 0.1–1.0)
NEUTROS PCT: 61 % (ref 43–77)
Neutro Abs: 3.7 10*3/uL (ref 1.7–7.7)
Platelets: 213 10*3/uL (ref 150–400)
RBC: 4.97 MIL/uL (ref 4.22–5.81)
RDW: 14 % (ref 11.5–15.5)
WBC: 6.1 10*3/uL (ref 4.0–10.5)

## 2013-12-05 LAB — LIPID PANEL
CHOL/HDL RATIO: 4.7 ratio
Cholesterol: 197 mg/dL (ref 0–200)
HDL: 42 mg/dL (ref >39)
LDL Cholesterol: 107 mg/dL — ABNORMAL HIGH (ref 0–99)
Triglycerides: 241 mg/dL — ABNORMAL HIGH (ref <150)
VLDL: 48 mg/dL — AB (ref 0–40)

## 2013-12-05 LAB — HEPATIC FUNCTION PANEL
ALT: 16 U/L (ref 0–53)
AST: 20 U/L (ref 0–37)
Albumin: 4.2 g/dL (ref 3.5–5.2)
Alkaline Phosphatase: 72 U/L (ref 39–117)
BILIRUBIN DIRECT: 0.1 mg/dL (ref 0.0–0.3)
BILIRUBIN TOTAL: 0.4 mg/dL (ref 0.2–1.2)
Indirect Bilirubin: 0.3 mg/dL (ref 0.2–1.2)
Total Protein: 6.7 g/dL (ref 6.0–8.3)

## 2013-12-05 LAB — HEMOGLOBIN A1C
HEMOGLOBIN A1C: 5.9 % — AB (ref <5.7)
MEAN PLASMA GLUCOSE: 123 mg/dL — AB (ref <117)

## 2013-12-06 LAB — MICROALBUMIN, URINE: Microalb, Ur: 5.45 mg/dL — ABNORMAL HIGH (ref 0.00–1.89)

## 2013-12-10 ENCOUNTER — Ambulatory Visit (INDEPENDENT_AMBULATORY_CARE_PROVIDER_SITE_OTHER): Payer: Medicare Other | Admitting: Family Medicine

## 2013-12-10 ENCOUNTER — Other Ambulatory Visit: Payer: Self-pay | Admitting: *Deleted

## 2013-12-10 ENCOUNTER — Encounter: Payer: Self-pay | Admitting: Family Medicine

## 2013-12-10 VITALS — BP 130/90 | Ht 68.0 in | Wt 222.4 lb

## 2013-12-10 DIAGNOSIS — C61 Malignant neoplasm of prostate: Secondary | ICD-10-CM

## 2013-12-10 DIAGNOSIS — R7303 Prediabetes: Secondary | ICD-10-CM

## 2013-12-10 DIAGNOSIS — R7309 Other abnormal glucose: Secondary | ICD-10-CM

## 2013-12-10 DIAGNOSIS — R809 Proteinuria, unspecified: Secondary | ICD-10-CM

## 2013-12-10 DIAGNOSIS — I10 Essential (primary) hypertension: Secondary | ICD-10-CM

## 2013-12-10 MED ORDER — LISINOPRIL 5 MG PO TABS: 5.0000 mg | ORAL_TABLET | Freq: Every day | ORAL | Status: DC

## 2013-12-10 NOTE — Progress Notes (Signed)
   Subjective:    Patient ID: [name] [name], male    DOB: Sep 15, 1950, 63 y.o.   MRN: [id number]  Hypertension This is a chronic problem. The current episode started more than 1 year ago. The problem has been gradually improving since onset. The problem is controlled. Pertinent negatives include no chest pain. There are no associated agents to hypertension. There are no known risk factors for coronary artery disease. Treatments tried: lisinopril. The current treatment provides significant improvement. There are no compliance problems.    Patient states he wants to discuss his preventive measures for cancer because all of his siblings including him have had cancer at some point.  Significant time was spent with this patient discussing cancer prevention warning signs of cancer as well as his prediabetes hypertension proteinuria and the maintenance of his prostate cancer greater than 25 minutes spent with patient on 9214  Review of Systems  Constitutional: Negative for activity change, appetite change and fatigue.  HENT: Negative for congestion.   Respiratory: Negative for cough.   Cardiovascular: Negative for chest pain.  Gastrointestinal: Negative for abdominal pain.  Endocrine: Negative for polydipsia and polyphagia.  Neurological: Negative for weakness.  Psychiatric/Behavioral: Negative for confusion.       Objective:   Physical Exam  Vitals reviewed. Constitutional: He appears well-nourished. No distress.  Cardiovascular: Normal rate, regular rhythm and normal heart sounds.   No murmur heard. Pulmonary/Chest: Effort normal and breath sounds normal. No respiratory distress.  Musculoskeletal: He exhibits no edema.  Lymphadenopathy:    He has no cervical adenopathy.  Neurological: He is alert.  Psychiatric: His behavior is normal.          Assessment & Plan:  1. Essential hypertension Blood pressure could be better we will boost up lisinopril trying to achieve better  goals.  2. Prediabetes Watch diet closely stay physical reactive. Recheck A1c in 6 months  3. Prostate cancer Monitored by his specialist. The specialist is also monitoring testosterone.  4. Microproteinuria Stable minimize protein in the diet monitor yearly

## 2013-12-10 NOTE — Patient Instructions (Signed)
DASH Eating Plan °DASH stands for "Dietary Approaches to Stop Hypertension." The DASH eating plan is a healthy eating plan that has been shown to reduce high blood pressure (hypertension). Additional health benefits may include reducing the risk of type 2 diabetes mellitus, heart disease, and stroke. The DASH eating plan may also help with weight loss. °WHAT DO I NEED TO KNOW ABOUT THE DASH EATING PLAN? °For the DASH eating plan, you will follow these general guidelines: °· Choose foods with a percent daily value for sodium of less than 5% (as listed on the food label). °· Use salt-free seasonings or herbs instead of table salt or sea salt. °· Check with your health care provider or pharmacist before using salt substitutes. °· Eat lower-sodium products, often labeled as "lower sodium" or "no salt added." °· Eat fresh foods. °· Eat more vegetables, fruits, and low-fat dairy products. °· Choose whole grains. Look for the word "whole" as the first word in the ingredient list. °· Choose fish and skinless chicken or turkey more often than red meat. Limit fish, poultry, and meat to 6 oz (170 g) each day. °· Limit sweets, desserts, sugars, and sugary drinks. °· Choose heart-healthy fats. °· Limit cheese to 1 oz (28 g) per day. °· Eat more home-cooked food and less restaurant, buffet, and fast food. °· Limit fried foods. °· Cook foods using methods other than frying. °· Limit canned vegetables. If you do use them, rinse them well to decrease the sodium. °· When eating at a restaurant, ask that your food be prepared with less salt, or no salt if possible. °WHAT FOODS CAN I EAT? °Seek help from a dietitian for individual calorie needs. °Grains °Whole grain or whole wheat bread. Brown rice. Whole grain or whole wheat pasta. Quinoa, bulgur, and whole grain cereals. Low-sodium cereals. Corn or whole wheat flour tortillas. Whole grain cornbread. Whole grain crackers. Low-sodium crackers. °Vegetables °Fresh or frozen vegetables  (raw, steamed, roasted, or grilled). Low-sodium or reduced-sodium tomato and vegetable juices. Low-sodium or reduced-sodium tomato sauce and paste. Low-sodium or reduced-sodium canned vegetables.  °Fruits °All fresh, canned (in natural juice), or frozen fruits. °Meat and Other Protein Products °Ground beef (85% or leaner), grass-fed beef, or beef trimmed of fat. Skinless chicken or turkey. Ground chicken or turkey. Pork trimmed of fat. All fish and seafood. Eggs. Dried beans, peas, or lentils. Unsalted nuts and seeds. Unsalted canned beans. °Dairy °Low-fat dairy products, such as skim or 1% milk, 2% or reduced-fat cheeses, low-fat ricotta or cottage cheese, or plain low-fat yogurt. Low-sodium or reduced-sodium cheeses. °Fats and Oils °Tub margarines without trans fats. Light or reduced-fat mayonnaise and salad dressings (reduced sodium). Avocado. Safflower, olive, or canola oils. Natural peanut or almond butter. °Other °Unsalted popcorn and pretzels. °The items listed above may not be a complete list of recommended foods or beverages. Contact your dietitian for more options. °WHAT FOODS ARE NOT RECOMMENDED? °Grains °White bread. White pasta. White rice. Refined cornbread. Bagels and croissants. Crackers that contain trans fat. °Vegetables °Creamed or fried vegetables. Vegetables in a cheese sauce. Regular canned vegetables. Regular canned tomato sauce and paste. Regular tomato and vegetable juices. °Fruits °Dried fruits. Canned fruit in light or heavy syrup. Fruit juice. °Meat and Other Protein Products °Fatty cuts of meat. Ribs, chicken wings, bacon, sausage, bologna, salami, chitterlings, fatback, hot dogs, bratwurst, and packaged luncheon meats. Salted nuts and seeds. Canned beans with salt. °Dairy °Whole or 2% milk, cream, half-and-half, and cream cheese. Whole-fat or sweetened yogurt. Full-fat   cheeses or blue cheese. Nondairy creamers and whipped toppings. Processed cheese, cheese spreads, or cheese  curds. °Condiments °Onion and garlic salt, seasoned salt, table salt, and sea salt. Canned and packaged gravies. Worcestershire sauce. Tartar sauce. Barbecue sauce. Teriyaki sauce. Soy sauce, including reduced sodium. Steak sauce. Fish sauce. Oyster sauce. Cocktail sauce. Horseradish. Ketchup and mustard. Meat flavorings and tenderizers. Bouillon cubes. Hot sauce. Tabasco sauce. Marinades. Taco seasonings. Relishes. °Fats and Oils °Butter, stick margarine, lard, shortening, ghee, and bacon fat. Coconut, palm kernel, or palm oils. Regular salad dressings. °Other °Pickles and olives. Salted popcorn and pretzels. °The items listed above may not be a complete list of foods and beverages to avoid. Contact your dietitian for more information. °WHERE CAN I FIND MORE INFORMATION? °National Heart, Lung, and [employer] Institute: www.nhlbi.nih.gov/health/health-topics/topics/dash/ °Document Released: 02/25/2011 Document Revised: 07/23/2013 Document Reviewed: 01/10/2013 °ExitCare® Patient Information ©2015 [employer]. This information is not intended to replace advice given to you by your health care provider. Make sure you discuss any questions you have with your health care provider. ° °

## 2014-01-17 ENCOUNTER — Ambulatory Visit (INDEPENDENT_AMBULATORY_CARE_PROVIDER_SITE_OTHER): Payer: Medicare Other | Admitting: *Deleted

## 2014-01-17 DIAGNOSIS — Z23 Encounter for immunization: Secondary | ICD-10-CM

## 2014-01-25 ENCOUNTER — Ambulatory Visit (INDEPENDENT_AMBULATORY_CARE_PROVIDER_SITE_OTHER): Payer: Medicare Other | Admitting: Urology

## 2014-01-25 DIAGNOSIS — E291 Testicular hypofunction: Secondary | ICD-10-CM

## 2014-01-25 DIAGNOSIS — Z8546 Personal history of malignant neoplasm of prostate: Secondary | ICD-10-CM

## 2014-03-01 ENCOUNTER — Other Ambulatory Visit: Payer: Self-pay | Admitting: Family Medicine

## 2014-03-05 ENCOUNTER — Other Ambulatory Visit: Payer: Self-pay | Admitting: *Deleted

## 2014-03-05 MED ORDER — ALLOPURINOL 100 MG PO TABS: 100.0000 mg | ORAL_TABLET | Freq: Every day | ORAL | Status: DC

## 2014-03-05 MED ORDER — LISINOPRIL 5 MG PO TABS: 5.0000 mg | ORAL_TABLET | Freq: Every day | ORAL | Status: DC

## 2014-03-05 MED ORDER — PRAVASTATIN SODIUM 80 MG PO TABS: 80.0000 mg | ORAL_TABLET | Freq: Every day | ORAL | Status: DC

## 2014-03-08 ENCOUNTER — Other Ambulatory Visit: Payer: Self-pay | Admitting: *Deleted

## 2014-03-08 MED ORDER — PRAVASTATIN SODIUM 80 MG PO TABS: 80.0000 mg | ORAL_TABLET | Freq: Every day | ORAL | Status: DC

## 2014-03-08 MED ORDER — ALLOPURINOL 100 MG PO TABS: 100.0000 mg | ORAL_TABLET | Freq: Every day | ORAL | Status: DC

## 2014-03-08 MED ORDER — LISINOPRIL 5 MG PO TABS: 5.0000 mg | ORAL_TABLET | Freq: Every day | ORAL | Status: DC

## 2014-03-18 ENCOUNTER — Telehealth: Payer: Self-pay | Admitting: Family Medicine

## 2014-03-18 MED ORDER — LISINOPRIL 10 MG PO TABS: 10.0000 mg | ORAL_TABLET | Freq: Every day | ORAL | Status: DC

## 2014-03-18 NOTE — Telephone Encounter (Signed)
I would recommend that we go to 10 mg tablet taken daily. Send in new prescription for 10 mg #30. 3 refills He needs to give [place] some feedback on his blood pressure within the next 2 weeks. If it is still not doing well we will need to adjust the dose again. Plus also may not be a bad idea for the patient to schedule follow-up within the next few weeks

## 2014-03-18 NOTE — Telephone Encounter (Signed)
NTC- verify info ( Epic says 5 mg) he may need dose adjusted with follow up. Also see other issues? Salt use? Swelling?

## 2014-03-18 NOTE — Telephone Encounter (Signed)
Patient states that he is taking Lisinopril 5 mg 1 tab in am and 1/2 night. Patient is not having any other issues, no swelling, etc. Patient does not use any salt.

## 2014-03-18 NOTE — Telephone Encounter (Signed)
Pt calling to say that his BP is elevated No new stressors, just the usual that he's been dealing with  Still taking his lisinopril 10mg    He's been taking 1 in the am an 1/2 at night to see if it would help Its been running at or around 190/92 - 219/119 the added 1/2 at night  Does not seem to be helping.   [facility] [place]

## 2014-03-18 NOTE — Telephone Encounter (Signed)
Notified patient recommend that we go to 10 mg tablet taken daily.  He needs to give [place] some feedback on his blood pressure within the next 2 weeks. If it is still not doing well we will need to adjust the dose again. Plus also may not be a bad idea for the patient to schedule follow-up within the next few weeks. Patient verbalized understanding, med sent to pharmacy.

## 2014-03-19 ENCOUNTER — Telehealth: Payer: Self-pay

## 2014-03-26 ENCOUNTER — Telehealth: Payer: Self-pay | Admitting: Family Medicine

## 2014-03-26 NOTE — Telephone Encounter (Signed)
Please review notes attached to chart from patient regarding his blood pressure.

## 2014-03-28 MED ORDER — LISINOPRIL-HYDROCHLOROTHIAZIDE 20-25 MG PO TABS: 1.0000 | ORAL_TABLET | Freq: Every day | ORAL | Status: DC

## 2014-03-28 NOTE — Telephone Encounter (Signed)
I am concerned about patient's blood pressure readings. They're significantly elevated. I would recommend that he increase the dose in the medicine to lisinopril 20 mg/HCTZ 25 mg. He may well need to have a 30 day supply sent in locally and he would like 90 day supply sent in to his mail order. Please discuss this with the patient. Please see the note that is on the front of his chart. He should follow-up in approximately 3-4 weeks. I would recommend he not check his blood pressure more than a couple times per day. He can bring his blood pressure monitor with him to the office visit in 3-4 weeks. He may do walking and stationary bike but I don't recommend weight lifting or vigorous cardio until his blood pressures are better. Certainly heart healthy diet recommended/avoid excessive salt

## 2014-03-28 NOTE — Telephone Encounter (Signed)
Patient notified and verbalized understanding. I did order the linsinopril 20mg  HCTZ 25mg  for a 30 day supply to [place], but wanted to let you know that he has been taking 20mg  total of the lisinopril. Did you need to increase the dose? If not, I will then send in a 90 day supply to [facility].

## 2014-03-28 NOTE — Telephone Encounter (Signed)
Thanks for the update, I was aware that he was taking 20 mg per his note. This particular medication has a combination of the 20 mg lisinopril along with 25 mg of hydrochlorothiazide so in essence this is the increase in medication. Thank you, patient should still send me some readings in a couple weeks and follow-up in one month

## 2014-04-18 ENCOUNTER — Ambulatory Visit: Payer: Medicare Other | Admitting: Orthopedic Surgery

## 2014-04-23 ENCOUNTER — Encounter: Payer: Self-pay | Admitting: Orthopedic Surgery

## 2014-04-23 ENCOUNTER — Ambulatory Visit (INDEPENDENT_AMBULATORY_CARE_PROVIDER_SITE_OTHER): Payer: Commercial Managed Care - HMO | Admitting: Orthopedic Surgery

## 2014-04-23 ENCOUNTER — Ambulatory Visit (INDEPENDENT_AMBULATORY_CARE_PROVIDER_SITE_OTHER): Payer: Commercial Managed Care - HMO

## 2014-04-23 VITALS — BP 117/64 | Ht 68.0 in | Wt 217.4 lb

## 2014-04-23 DIAGNOSIS — Z96641 Presence of right artificial hip joint: Secondary | ICD-10-CM | POA: Diagnosis not present

## 2014-04-23 NOTE — Progress Notes (Signed)
Chief Complaint  Patient presents with  . Follow-up     follow up + xray Right THA, DOS 04/20/13    BP 117/64 mmHg  Ht 5\' 8"  (1.727 m)  Wt 217 lb 6.4 oz (98.612 kg)  BMI 33.06 kg/m2  Encounter Diagnosis  Name Primary?  . History of total replacement of right hip Yes    Patient is doing well his back is also doing well  Review of systems he does have some cramping type feeling in his legs when he walks uphill but none when he is walking flat no problems with his hip  He is walking well without assistive devices he has no tenderness in his hip he has excellent range of motion in the hip and the hip is stable. His motor exam is normal he does have a slight rash on the lower part of his right leg pulses are good he has normal distal sensation is alert and oriented 3 his mood and affect are normal  His x-rays show a stable hip prosthesis.  Follow-up in a year for repeat annual total hip film

## 2014-05-30 ENCOUNTER — Telehealth: Payer: Self-pay | Admitting: Family Medicine

## 2014-05-30 DIAGNOSIS — I10 Essential (primary) hypertension: Secondary | ICD-10-CM

## 2014-05-30 DIAGNOSIS — Z79899 Other long term (current) drug therapy: Secondary | ICD-10-CM

## 2014-05-30 DIAGNOSIS — E785 Hyperlipidemia, unspecified: Secondary | ICD-10-CM

## 2014-05-30 DIAGNOSIS — M109 Gout, unspecified: Secondary | ICD-10-CM

## 2014-05-30 DIAGNOSIS — E119 Type 2 diabetes mellitus without complications: Secondary | ICD-10-CM

## 2014-05-30 NOTE — Telephone Encounter (Signed)
Pt called requesting lab orders to be sent over. Last labs were: lipid,hepatic,A1C,microalbumin,and cbc with diff on 12/05/13

## 2014-05-30 NOTE — Telephone Encounter (Signed)
Pt notified and verbalized understanding to go to [facility] and be fasting. 

## 2014-05-30 NOTE — Telephone Encounter (Signed)
Lipid/liver/met 7/ A1C/uric acid  ( confirm with pt that his urologist is following testosterone and PSA )

## 2014-05-31 DIAGNOSIS — E119 Type 2 diabetes mellitus without complications: Secondary | ICD-10-CM | POA: Diagnosis not present

## 2014-05-31 DIAGNOSIS — M109 Gout, unspecified: Secondary | ICD-10-CM | POA: Diagnosis not present

## 2014-05-31 DIAGNOSIS — Z79899 Other long term (current) drug therapy: Secondary | ICD-10-CM | POA: Diagnosis not present

## 2014-05-31 DIAGNOSIS — E785 Hyperlipidemia, unspecified: Secondary | ICD-10-CM | POA: Diagnosis not present

## 2014-05-31 DIAGNOSIS — I10 Essential (primary) hypertension: Secondary | ICD-10-CM | POA: Diagnosis not present

## 2014-06-01 LAB — BASIC METABOLIC PANEL
BUN/Creatinine Ratio: 13 (ref 10–22)
BUN: 16 mg/dL (ref 8–27)
CO2: 26 mmol/L (ref 18–29)
Calcium: 9 mg/dL (ref 8.6–10.2)
Chloride: 99 mmol/L (ref 97–108)
Creatinine, Ser: 1.21 mg/dL (ref 0.76–1.27)
GFR calc Af Amer: 72 mL/min/{1.73_m2} (ref >59)
GFR calc non Af Amer: 62 mL/min/{1.73_m2} (ref >59)
GLUCOSE: 113 mg/dL — AB (ref 65–99)
Potassium: 4.6 mmol/L (ref 3.5–5.2)
SODIUM: 140 mmol/L (ref 134–144)

## 2014-06-01 LAB — URIC ACID: URIC ACID: 7.6 mg/dL (ref 3.7–8.6)

## 2014-06-01 LAB — LIPID PANEL
Chol/HDL Ratio: 5.3 ratio units — ABNORMAL HIGH (ref 0.0–5.0)
Cholesterol, Total: 219 mg/dL — ABNORMAL HIGH (ref 100–199)
HDL: 41 mg/dL (ref >39)
LDL CALC: 136 mg/dL — AB (ref 0–99)
TRIGLYCERIDES: 208 mg/dL — AB (ref 0–149)
VLDL CHOLESTEROL CAL: 42 mg/dL — AB (ref 5–40)

## 2014-06-01 LAB — HEPATIC FUNCTION PANEL
ALBUMIN: 4.4 g/dL (ref 3.6–4.8)
ALK PHOS: 74 IU/L (ref 39–117)
ALT: 22 IU/L (ref 0–44)
AST: 22 IU/L (ref 0–40)
Bilirubin Total: 0.8 mg/dL (ref 0.0–1.2)
Bilirubin, Direct: 0.14 mg/dL (ref 0.00–0.40)
Total Protein: 7 g/dL (ref 6.0–8.5)

## 2014-06-01 LAB — HEMOGLOBIN A1C
Est. average glucose Bld gHb Est-mCnc: 117 mg/dL
HEMOGLOBIN A1C: 5.7 % — AB (ref 4.8–5.6)

## 2014-06-07 ENCOUNTER — Ambulatory Visit (INDEPENDENT_AMBULATORY_CARE_PROVIDER_SITE_OTHER): Payer: Commercial Managed Care - HMO | Admitting: Family Medicine

## 2014-06-07 ENCOUNTER — Encounter: Payer: Self-pay | Admitting: Family Medicine

## 2014-06-07 VITALS — BP 130/78 | Ht 68.0 in | Wt 219.8 lb

## 2014-06-07 DIAGNOSIS — E785 Hyperlipidemia, unspecified: Secondary | ICD-10-CM | POA: Diagnosis not present

## 2014-06-07 DIAGNOSIS — I10 Essential (primary) hypertension: Secondary | ICD-10-CM | POA: Diagnosis not present

## 2014-06-07 DIAGNOSIS — M109 Gout, unspecified: Secondary | ICD-10-CM

## 2014-06-07 DIAGNOSIS — R7309 Other abnormal glucose: Secondary | ICD-10-CM

## 2014-06-07 DIAGNOSIS — R7303 Prediabetes: Secondary | ICD-10-CM

## 2014-06-07 MED ORDER — ALLOPURINOL 100 MG PO TABS: 100.0000 mg | ORAL_TABLET | Freq: Every day | ORAL | Status: DC

## 2014-06-07 MED ORDER — ROSUVASTATIN CALCIUM 20 MG PO TABS: 20.0000 mg | ORAL_TABLET | Freq: Every day | ORAL | Status: DC

## 2014-06-07 MED ORDER — LISINOPRIL-HYDROCHLOROTHIAZIDE 20-25 MG PO TABS: 1.0000 | ORAL_TABLET | Freq: Every day | ORAL | Status: DC

## 2014-06-07 NOTE — Progress Notes (Signed)
   Subjective:    Patient ID: [name] [name], male    DOB: 12-09-1950, 64 y.o.   MRN: [id number]  Hypertension This is a chronic problem. The current episode started more than 1 year ago. Pertinent negatives include no chest pain. Treatments tried: lisinopril/hctz. There are no compliance problems.   Hyperlipidemia This is a chronic problem. The problem is uncontrolled. Recent lipid tests were reviewed and are high. Exacerbating diseases include obesity. There are no known factors aggravating his hyperlipidemia. Pertinent negatives include no chest pain. Current antihyperlipidemic treatment includes statins. The current treatment provides mild improvement of lipids. There are no compliance problems.  Risk factors for coronary artery disease include diabetes mellitus, male sex and obesity.     Review of Systems  Constitutional: Negative for activity change, appetite change and fatigue.  HENT: Negative for congestion.   Respiratory: Negative for cough.   Cardiovascular: Negative for chest pain.  Gastrointestinal: Negative for abdominal pain.  Endocrine: Negative for polydipsia and polyphagia.  Neurological: Negative for weakness.  Psychiatric/Behavioral: Negative for confusion.       Objective:   Physical Exam  Constitutional: He appears well-nourished. No distress.  Cardiovascular: Normal rate, regular rhythm and normal heart sounds.   No murmur heard. Pulmonary/Chest: Effort normal and breath sounds normal. No respiratory distress.  Musculoskeletal: He exhibits no edema.  Lymphadenopathy:    He has no cervical adenopathy.  Neurological: He is alert.  Psychiatric: His behavior is normal.  Vitals reviewed.     25 minutes spent with the patient going over multiple different issues    Assessment & Plan:  1. Hyperlipemia Will switch to Crestor. Patient did not tolerate Lipitor but hopefully will tolerate Crestor he will let us know if any problems check a lipid profile in  approximately 6-8 weeks - Lipid panel  2. Essential hypertension Blood pressure under good control continue current measures. - Hepatic function panel  3. Gout without tophus, unspecified cause, unspecified chronicity, unspecified site Uric acid level looks good continue current measures.  4. Prediabetes A1c under good control watch diet stay physically active  Follow-up 6 months

## 2014-06-07 NOTE — Patient Instructions (Signed)
DASH Eating Plan °DASH stands for "Dietary Approaches to Stop Hypertension." The DASH eating plan is a healthy eating plan that has been shown to reduce high blood pressure (hypertension). Additional health benefits may include reducing the risk of type 2 diabetes mellitus, heart disease, and stroke. The DASH eating plan may also help with weight loss. °WHAT DO I NEED TO KNOW ABOUT THE DASH EATING PLAN? °For the DASH eating plan, you will follow these general guidelines: °· Choose foods with a percent daily value for sodium of less than 5% (as listed on the food label). °· Use salt-free seasonings or herbs instead of table salt or sea salt. °· Check with your health care provider or pharmacist before using salt substitutes. °· Eat lower-sodium products, often labeled as "lower sodium" or "no salt added." °· Eat fresh foods. °· Eat more vegetables, fruits, and low-fat dairy products. °· Choose whole grains. Look for the word "whole" as the first word in the ingredient list. °· Choose fish and skinless chicken or turkey more often than red meat. Limit fish, poultry, and meat to 6 oz (170 g) each day. °· Limit sweets, desserts, sugars, and sugary drinks. °· Choose heart-healthy fats. °· Limit cheese to 1 oz (28 g) per day. °· Eat more home-cooked food and less restaurant, buffet, and fast food. °· Limit fried foods. °· Cook foods using methods other than frying. °· Limit canned vegetables. If you do use them, rinse them well to decrease the sodium. °· When eating at a restaurant, ask that your food be prepared with less salt, or no salt if possible. °WHAT FOODS CAN I EAT? °Seek help from a dietitian for individual calorie needs. °Grains °Whole grain or whole wheat bread. Brown rice. Whole grain or whole wheat pasta. Quinoa, bulgur, and whole grain cereals. Low-sodium cereals. Corn or whole wheat flour tortillas. Whole grain cornbread. Whole grain crackers. Low-sodium crackers. °Vegetables °Fresh or frozen vegetables  (raw, steamed, roasted, or grilled). Low-sodium or reduced-sodium tomato and vegetable juices. Low-sodium or reduced-sodium tomato sauce and paste. Low-sodium or reduced-sodium canned vegetables.  °Fruits °All fresh, canned (in natural juice), or frozen fruits. °Meat and Other Protein Products °Ground beef (85% or leaner), grass-fed beef, or beef trimmed of fat. Skinless chicken or turkey. Ground chicken or turkey. Pork trimmed of fat. All fish and seafood. Eggs. Dried beans, peas, or lentils. Unsalted nuts and seeds. Unsalted canned beans. °Dairy °Low-fat dairy products, such as skim or 1% milk, 2% or reduced-fat cheeses, low-fat ricotta or cottage cheese, or plain low-fat yogurt. Low-sodium or reduced-sodium cheeses. °Fats and Oils °Tub margarines without trans fats. Light or reduced-fat mayonnaise and salad dressings (reduced sodium). Avocado. Safflower, olive, or canola oils. Natural peanut or almond butter. °Other °Unsalted popcorn and pretzels. °The items listed above may not be a complete list of recommended foods or beverages. Contact your dietitian for more options. °WHAT FOODS ARE NOT RECOMMENDED? °Grains °White bread. White pasta. White rice. Refined cornbread. Bagels and croissants. Crackers that contain trans fat. °Vegetables °Creamed or fried vegetables. Vegetables in a cheese sauce. Regular canned vegetables. Regular canned tomato sauce and paste. Regular tomato and vegetable juices. °Fruits °Dried fruits. Canned fruit in light or heavy syrup. Fruit juice. °Meat and Other Protein Products °Fatty cuts of meat. Ribs, chicken wings, bacon, sausage, bologna, salami, chitterlings, fatback, hot dogs, bratwurst, and packaged luncheon meats. Salted nuts and seeds. Canned beans with salt. °Dairy °Whole or 2% milk, cream, half-and-half, and cream cheese. Whole-fat or sweetened yogurt. Full-fat   cheeses or blue cheese. Nondairy creamers and whipped toppings. Processed cheese, cheese spreads, or cheese  curds. °Condiments °Onion and garlic salt, seasoned salt, table salt, and sea salt. Canned and packaged gravies. Worcestershire sauce. Tartar sauce. Barbecue sauce. Teriyaki sauce. Soy sauce, including reduced sodium. Steak sauce. Fish sauce. Oyster sauce. Cocktail sauce. Horseradish. Ketchup and mustard. Meat flavorings and tenderizers. Bouillon cubes. Hot sauce. Tabasco sauce. Marinades. Taco seasonings. Relishes. °Fats and Oils °Butter, stick margarine, lard, shortening, ghee, and bacon fat. Coconut, palm kernel, or palm oils. Regular salad dressings. °Other °Pickles and olives. Salted popcorn and pretzels. °The items listed above may not be a complete list of foods and beverages to avoid. Contact your dietitian for more information. °WHERE CAN I FIND MORE INFORMATION? °National Heart, Lung, and [employer] Institute: www.nhlbi.nih.gov/health/health-topics/topics/dash/ °Document Released: 02/25/2011 Document Revised: 07/23/2013 Document Reviewed: 01/10/2013 °ExitCare® Patient Information ©2015 [employer]. This information is not intended to replace advice given to you by your health care provider. Make sure you discuss any questions you have with your health care provider. ° °

## 2014-06-20 ENCOUNTER — Other Ambulatory Visit: Payer: Self-pay | Admitting: *Deleted

## 2014-06-20 DIAGNOSIS — E785 Hyperlipidemia, unspecified: Secondary | ICD-10-CM

## 2014-06-20 DIAGNOSIS — I10 Essential (primary) hypertension: Secondary | ICD-10-CM

## 2014-06-20 DIAGNOSIS — Z79899 Other long term (current) drug therapy: Secondary | ICD-10-CM

## 2014-06-20 NOTE — Telephone Encounter (Signed)
Fax from [facility] requesting change in med. Pt taking crestor 20mg . Pt can get atorvastatin cheaper. Pt last seen 06/07/14 for med check.

## 2014-06-24 NOTE — Telephone Encounter (Signed)
[place] to see 

## 2014-06-25 ENCOUNTER — Other Ambulatory Visit: Payer: Self-pay | Admitting: Family Medicine

## 2014-06-25 NOTE — Telephone Encounter (Signed)
Although 8 toward a statin is cheaper this is the generic of Lipitor. When the patient last tried Lipitor he had muscle aches with it. If the patient is certain that he once to try it again because it is cheaper we can tried again but it may cause the same side effects that it did before. Please clarify with the patient before we make a change. The other option is sticking with the Crestor although higher cost he seems to be getting along with

## 2014-06-26 NOTE — Telephone Encounter (Signed)
Patient states that the Lipitor cost $130 and he wants to start back on the Lipitor because it is much cheaper. He is aware that it caused muscle aches in the past and is willing to try it again.

## 2014-06-26 NOTE — Telephone Encounter (Signed)
Patient notified and verbalized understanding. Crestor canceled and Lipitor sent. BW orders are in.

## 2014-06-26 NOTE — Telephone Encounter (Signed)
We will start slow then gradually build up. Prescribed as Lipitor 20 mg, #90, 3 refills. Cancel Crestor. Tell the patient for the first 2 weeks to take the medication every other day then start taking daily. I recommend checking lipid liver profile in approximately 8-10 weeks. If any problems let us know.

## 2014-06-26 NOTE — Addendum Note (Signed)
Addended byCharolotte Capuchin D on: 06/26/2014 01:20 PM   Modules accepted: Orders, Medications

## 2014-07-22 ENCOUNTER — Telehealth: Payer: Self-pay | Admitting: Family Medicine

## 2014-07-22 MED ORDER — ATORVASTATIN CALCIUM 40 MG PO TABS: 40.0000 mg | ORAL_TABLET | Freq: Every day | ORAL | Status: DC

## 2014-07-22 NOTE — Telephone Encounter (Signed)
Please talk with Pilar Plate. I do remember recommending switching his medication. I thought it was Crestor. He might have opted to try Lipitor again because of the lower cost. Please discuss with him which education. If Crestor 20 mg daily if Lipitor then 40 mg daily. May send in a two-week prescription and send in 90 day prescription as well with 2 refills

## 2014-07-22 NOTE — Telephone Encounter (Signed)
Patient states Lipitor was chosen due to the cheaper cost. Medication sent to pharmacy.

## 2014-07-22 NOTE — Telephone Encounter (Signed)
Patient says that his pravastatin was recently changed to lipitor and a new prescription was supposed to be called in to [facility].  [facility] does not have a prescription for this and he is completely out of the Pravastatin.  Can we send in an Rx for Lipitor to [facility]?  Also, since it will take 2 weeks for this medication to come to him, can we send in a 15 day supply to [place]?

## 2014-07-22 NOTE — Telephone Encounter (Signed)
Last office visit in March says patient was being changed to crestor.

## 2014-08-29 ENCOUNTER — Other Ambulatory Visit: Payer: Self-pay | Admitting: Family Medicine

## 2014-09-25 DIAGNOSIS — E291 Testicular hypofunction: Secondary | ICD-10-CM | POA: Diagnosis not present

## 2014-09-25 DIAGNOSIS — Z8546 Personal history of malignant neoplasm of prostate: Secondary | ICD-10-CM | POA: Diagnosis not present

## 2014-10-18 ENCOUNTER — Telehealth: Payer: Self-pay | Admitting: Family Medicine

## 2014-10-18 NOTE — Telephone Encounter (Signed)
Ov [place] next wk

## 2014-10-18 NOTE — Telephone Encounter (Signed)
Transferred patient to front desk to schedule appointment.  

## 2014-10-18 NOTE — Telephone Encounter (Signed)
Pt called requesting referral to Dr. Denna Haggard for "jock itch"  Patient states that every summer he gets a rash between his legs that OTC meds do not help Referral required due to his [facility] Gold Plus coverage.  Ok to refer or NTBS?  Please advise & call pt

## 2014-10-22 ENCOUNTER — Ambulatory Visit (INDEPENDENT_AMBULATORY_CARE_PROVIDER_SITE_OTHER): Payer: Commercial Managed Care - HMO | Admitting: Family Medicine

## 2014-10-22 ENCOUNTER — Encounter: Payer: Self-pay | Admitting: Family Medicine

## 2014-10-22 VITALS — BP 128/82 | Ht 68.0 in | Wt 219.0 lb

## 2014-10-22 DIAGNOSIS — I10 Essential (primary) hypertension: Secondary | ICD-10-CM | POA: Diagnosis not present

## 2014-10-22 DIAGNOSIS — B356 Tinea cruris: Secondary | ICD-10-CM | POA: Diagnosis not present

## 2014-10-22 DIAGNOSIS — E785 Hyperlipidemia, unspecified: Secondary | ICD-10-CM | POA: Diagnosis not present

## 2014-10-22 DIAGNOSIS — R5383 Other fatigue: Secondary | ICD-10-CM

## 2014-10-22 DIAGNOSIS — M1000 Idiopathic gout, unspecified site: Secondary | ICD-10-CM

## 2014-10-22 DIAGNOSIS — R7303 Prediabetes: Secondary | ICD-10-CM

## 2014-10-22 DIAGNOSIS — R7309 Other abnormal glucose: Secondary | ICD-10-CM

## 2014-10-22 MED ORDER — FLUCONAZOLE 200 MG PO TABS: 200.0000 mg | ORAL_TABLET | Freq: Every day | ORAL | Status: DC

## 2014-10-22 MED ORDER — KETOCONAZOLE 2 % EX CREA: 1.0000 "application " | TOPICAL_CREAM | Freq: Two times a day (BID) | CUTANEOUS | Status: DC

## 2014-10-22 NOTE — Progress Notes (Signed)
   Subjective:    Patient ID: [name] [name], male    DOB: May 05, 1950, 64 y.o.   MRN: [id number]  Rash This is a recurrent problem. The current episode started more than 1 year ago. The problem has been gradually worsening since onset. The affected locations include the groin. He was exposed to nothing. Treatments tried: Antifungal cream, Diaper rash cream. The treatment provided no relief.   Patient in today to discuss getting a referral to Dermatology for rash in groin area. Patient states no other concerns this visit. Has tried multiple over-the-counter measures without success states he does sweat moderate  Review of Systems  Skin: Positive for rash.   denies fever chills nausea vomiting diarrhea     Objective:   Physical Exam Physical exam the patient has erythematous rash in the left groin region with scalding skin the right side has minimal rash testicular exam normal       Assessment & Plan:  Culture was taken Probable tinea Patient requesting referral to dermatology Diflucan 200 mg 1 daily for the next 14-28 days Do not take cholchicine or Lipitor while on this medication Lab work ordered for medical review office visit in September

## 2014-10-25 LAB — WOUND CULTURE: Organism ID, Bacteria: NONE SEEN

## 2014-10-28 ENCOUNTER — Encounter: Payer: Self-pay | Admitting: Family Medicine

## 2014-10-28 ENCOUNTER — Other Ambulatory Visit: Payer: Self-pay | Admitting: Family Medicine

## 2014-11-12 DIAGNOSIS — D239 Other benign neoplasm of skin, unspecified: Secondary | ICD-10-CM | POA: Diagnosis not present

## 2014-11-12 DIAGNOSIS — B356 Tinea cruris: Secondary | ICD-10-CM | POA: Diagnosis not present

## 2014-11-12 DIAGNOSIS — B359 Dermatophytosis, unspecified: Secondary | ICD-10-CM | POA: Diagnosis not present

## 2014-11-30 DIAGNOSIS — R7309 Other abnormal glucose: Secondary | ICD-10-CM | POA: Diagnosis not present

## 2014-11-30 DIAGNOSIS — I10 Essential (primary) hypertension: Secondary | ICD-10-CM | POA: Diagnosis not present

## 2014-11-30 DIAGNOSIS — E785 Hyperlipidemia, unspecified: Secondary | ICD-10-CM | POA: Diagnosis not present

## 2014-11-30 DIAGNOSIS — R5383 Other fatigue: Secondary | ICD-10-CM | POA: Diagnosis not present

## 2014-12-02 LAB — HEPATIC FUNCTION PANEL
ALT: 20 IU/L (ref 0–44)
AST: 22 IU/L (ref 0–40)
Albumin: 4.3 g/dL (ref 3.6–4.8)
Alkaline Phosphatase: 77 IU/L (ref 39–117)
Bilirubin Total: 0.4 mg/dL (ref 0.0–1.2)
Bilirubin, Direct: 0.1 mg/dL (ref 0.00–0.40)
Total Protein: 6.9 g/dL (ref 6.0–8.5)

## 2014-12-02 LAB — BASIC METABOLIC PANEL
BUN/Creatinine Ratio: 12 (ref 10–22)
BUN: 17 mg/dL (ref 8–27)
CHLORIDE: 99 mmol/L (ref 97–108)
CO2: 25 mmol/L (ref 18–29)
Calcium: 9.2 mg/dL (ref 8.6–10.2)
Creatinine, Ser: 1.41 mg/dL — ABNORMAL HIGH (ref 0.76–1.27)
GFR calc Af Amer: 60 mL/min/{1.73_m2} (ref >59)
GFR calc non Af Amer: 52 mL/min/{1.73_m2} — ABNORMAL LOW (ref >59)
GLUCOSE: 116 mg/dL — AB (ref 65–99)
Potassium: 4.9 mmol/L (ref 3.5–5.2)
Sodium: 142 mmol/L (ref 134–144)

## 2014-12-02 LAB — LIPID PANEL
CHOL/HDL RATIO: 7.2 ratio — AB (ref 0.0–5.0)
Cholesterol, Total: 253 mg/dL — ABNORMAL HIGH (ref 100–199)
HDL: 35 mg/dL — AB (ref >39)
LDL CALC: 171 mg/dL — AB (ref 0–99)
TRIGLYCERIDES: 237 mg/dL — AB (ref 0–149)
VLDL CHOLESTEROL CAL: 47 mg/dL — AB (ref 5–40)

## 2014-12-02 LAB — TSH: TSH: 1.96 u[IU]/mL (ref 0.450–4.500)

## 2014-12-02 LAB — HEMOGLOBIN A1C
ESTIMATED AVERAGE GLUCOSE: 126 mg/dL
Hgb A1c MFr Bld: 6 % — ABNORMAL HIGH (ref 4.8–5.6)

## 2014-12-03 ENCOUNTER — Other Ambulatory Visit: Payer: Self-pay

## 2014-12-03 DIAGNOSIS — R7989 Other specified abnormal findings of blood chemistry: Secondary | ICD-10-CM

## 2014-12-09 DIAGNOSIS — R748 Abnormal levels of other serum enzymes: Secondary | ICD-10-CM | POA: Diagnosis not present

## 2014-12-10 LAB — BASIC METABOLIC PANEL
BUN/Creatinine Ratio: 16 (ref 10–22)
BUN: 20 mg/dL (ref 8–27)
CALCIUM: 9.7 mg/dL (ref 8.6–10.2)
CO2: 28 mmol/L (ref 18–29)
CREATININE: 1.23 mg/dL (ref 0.76–1.27)
Chloride: 100 mmol/L (ref 97–108)
GFR calc Af Amer: 70 mL/min/{1.73_m2} (ref >59)
GFR calc non Af Amer: 61 mL/min/{1.73_m2} (ref >59)
Glucose: 113 mg/dL — ABNORMAL HIGH (ref 65–99)
POTASSIUM: 4.3 mmol/L (ref 3.5–5.2)
Sodium: 142 mmol/L (ref 134–144)

## 2014-12-11 ENCOUNTER — Ambulatory Visit: Payer: Commercial Managed Care - HMO | Admitting: Family Medicine

## 2014-12-12 ENCOUNTER — Ambulatory Visit (INDEPENDENT_AMBULATORY_CARE_PROVIDER_SITE_OTHER): Payer: Commercial Managed Care - HMO | Admitting: Family Medicine

## 2014-12-12 ENCOUNTER — Encounter: Payer: Self-pay | Admitting: Family Medicine

## 2014-12-12 VITALS — BP 130/76 | Ht 68.0 in | Wt 219.1 lb

## 2014-12-12 DIAGNOSIS — Z23 Encounter for immunization: Secondary | ICD-10-CM | POA: Diagnosis not present

## 2014-12-12 DIAGNOSIS — M1711 Unilateral primary osteoarthritis, right knee: Secondary | ICD-10-CM | POA: Diagnosis not present

## 2014-12-12 DIAGNOSIS — Z79899 Other long term (current) drug therapy: Secondary | ICD-10-CM

## 2014-12-12 DIAGNOSIS — E785 Hyperlipidemia, unspecified: Secondary | ICD-10-CM

## 2014-12-12 DIAGNOSIS — R0683 Snoring: Secondary | ICD-10-CM

## 2014-12-12 DIAGNOSIS — R7309 Other abnormal glucose: Secondary | ICD-10-CM

## 2014-12-12 DIAGNOSIS — I10 Essential (primary) hypertension: Secondary | ICD-10-CM | POA: Diagnosis not present

## 2014-12-12 DIAGNOSIS — R7303 Prediabetes: Secondary | ICD-10-CM

## 2014-12-12 MED ORDER — LISINOPRIL-HYDROCHLOROTHIAZIDE 20-25 MG PO TABS: 1.0000 | ORAL_TABLET | Freq: Every day | ORAL | Status: DC

## 2014-12-12 NOTE — Progress Notes (Signed)
   Subjective:    Patient ID: [name] [name], male    DOB: [birth year], 64 y.o.   MRN: [id number]  Hypertension This is a chronic problem. The current episode started more than 1 year ago. The problem has been gradually improving since onset. Pertinent negatives include no chest pain. There are no associated agents to hypertension. There are no known risk factors for coronary artery disease. Treatments tried: lisinopril-hctz. The current treatment provides moderate improvement. There are no compliance problems.    Patient states that he wants a referral for a sleep study. He is snoring a lot.  Patient relates he is try to watch starches in his diet he states is unable to do as much exercise because of severe off osteoarthritis of both knees and left hip He relates that he has not been taking cholesterol medicine over the past couple months because of him being on anti-fungal medicine now that he is finished with his medicine he is going to start back on his cholesterol medicine Patient relates he is seen by urology and he needs a referral for ongoing care  Review of Systems  Constitutional: Negative for activity change, appetite change and fatigue.  HENT: Negative for congestion.   Respiratory: Negative for cough.   Cardiovascular: Negative for chest pain.  Gastrointestinal: Negative for abdominal pain.  Endocrine: Negative for polydipsia and polyphagia.  Neurological: Negative for weakness.  Psychiatric/Behavioral: Negative for confusion.       Objective:   Physical Exam  Constitutional: He appears well-nourished. No distress.  Cardiovascular: Normal rate, regular rhythm and normal heart sounds.   No murmur heard. Pulmonary/Chest: Effort normal and breath sounds normal. No respiratory distress.  Musculoskeletal: He exhibits no edema.  Lymphadenopathy:    He has no cervical adenopathy.  Neurological: He is alert.  Psychiatric: His behavior is normal.  Vitals reviewed.  Greater  than 25 minutes spent with patient covering multiple different issues discussing his lab results options and appropriate treatment and goals.       Assessment & Plan:  1. Encounter for immunization Flu vaccine today will need Pneumovax 23 later this year  2. Essential hypertension Blood pressure good control continue current measures watch diet  3. Prediabetes Patient has difficult time being physically active but we do need to check lab work await the results of this follow-up in approximately 3 months check A1c at that visit - Microalbumin / creatinine urine ratio  4. Hyperlipidemia Patient is instructed to start back on his cholesterol medicine and check lipid liver profile in 4-12 weeks will need follow-up visit in early January - Lipid panel  5. High risk medication use See below. - Hepatic function panel - Basic metabolic panel  6. Snoring We'll send the patient a sleep survey he will finish this out possibly will need sleep study patient will be having orthopedic evaluation at the turn of next year after he gets this if they recommend surgery he will let us know cardiac clearance before surgery  7. Primary osteoarthritis of right knee See above

## 2014-12-12 NOTE — Patient Instructions (Signed)
DASH Eating Plan °DASH stands for "Dietary Approaches to Stop Hypertension." The DASH eating plan is a healthy eating plan that has been shown to reduce high blood pressure (hypertension). Additional health benefits may include reducing the risk of type 2 diabetes mellitus, heart disease, and stroke. The DASH eating plan may also help with weight loss. °WHAT DO I NEED TO KNOW ABOUT THE DASH EATING PLAN? °For the DASH eating plan, you will follow these general guidelines: °· Choose foods with a percent daily value for sodium of less than 5% (as listed on the food label). °· Use salt-free seasonings or herbs instead of table salt or sea salt. °· Check with your health care provider or pharmacist before using salt substitutes. °· Eat lower-sodium products, often labeled as "lower sodium" or "no salt added." °· Eat fresh foods. °· Eat more vegetables, fruits, and low-fat dairy products. °· Choose whole grains. Look for the word "whole" as the first word in the ingredient list. °· Choose fish and skinless chicken or turkey more often than red meat. Limit fish, poultry, and meat to 6 oz (170 g) each day. °· Limit sweets, desserts, sugars, and sugary drinks. °· Choose heart-healthy fats. °· Limit cheese to 1 oz (28 g) per day. °· Eat more home-cooked food and less restaurant, buffet, and fast food. °· Limit fried foods. °· Cook foods using methods other than frying. °· Limit canned vegetables. If you do use them, rinse them well to decrease the sodium. °· When eating at a restaurant, ask that your food be prepared with less salt, or no salt if possible. °WHAT FOODS CAN I EAT? °Seek help from a dietitian for individual calorie needs. °Grains °Whole grain or whole wheat bread. Brown rice. Whole grain or whole wheat pasta. Quinoa, bulgur, and whole grain cereals. Low-sodium cereals. Corn or whole wheat flour tortillas. Whole grain cornbread. Whole grain crackers. Low-sodium crackers. °Vegetables °Fresh or frozen vegetables  (raw, steamed, roasted, or grilled). Low-sodium or reduced-sodium tomato and vegetable juices. Low-sodium or reduced-sodium tomato sauce and paste. Low-sodium or reduced-sodium canned vegetables.  °Fruits °All fresh, canned (in natural juice), or frozen fruits. °Meat and Other Protein Products °Ground beef (85% or leaner), grass-fed beef, or beef trimmed of fat. Skinless chicken or turkey. Ground chicken or turkey. Pork trimmed of fat. All fish and seafood. Eggs. Dried beans, peas, or lentils. Unsalted nuts and seeds. Unsalted canned beans. °Dairy °Low-fat dairy products, such as skim or 1% milk, 2% or reduced-fat cheeses, low-fat ricotta or cottage cheese, or plain low-fat yogurt. Low-sodium or reduced-sodium cheeses. °Fats and Oils °Tub margarines without trans fats. Light or reduced-fat mayonnaise and salad dressings (reduced sodium). Avocado. Safflower, olive, or canola oils. Natural peanut or almond butter. °Other °Unsalted popcorn and pretzels. °The items listed above may not be a complete list of recommended foods or beverages. Contact your dietitian for more options. °WHAT FOODS ARE NOT RECOMMENDED? °Grains °White bread. White pasta. White rice. Refined cornbread. Bagels and croissants. Crackers that contain trans fat. °Vegetables °Creamed or fried vegetables. Vegetables in a cheese sauce. Regular canned vegetables. Regular canned tomato sauce and paste. Regular tomato and vegetable juices. °Fruits °Dried fruits. Canned fruit in light or heavy syrup. Fruit juice. °Meat and Other Protein Products °Fatty cuts of meat. Ribs, chicken wings, bacon, sausage, bologna, salami, chitterlings, fatback, hot dogs, bratwurst, and packaged luncheon meats. Salted nuts and seeds. Canned beans with salt. °Dairy °Whole or 2% milk, cream, half-and-half, and cream cheese. Whole-fat or sweetened yogurt. Full-fat   cheeses or blue cheese. Nondairy creamers and whipped toppings. Processed cheese, cheese spreads, or cheese  curds. °Condiments °Onion and garlic salt, seasoned salt, table salt, and sea salt. Canned and packaged gravies. Worcestershire sauce. Tartar sauce. Barbecue sauce. Teriyaki sauce. Soy sauce, including reduced sodium. Steak sauce. Fish sauce. Oyster sauce. Cocktail sauce. Horseradish. Ketchup and mustard. Meat flavorings and tenderizers. Bouillon cubes. Hot sauce. Tabasco sauce. Marinades. Taco seasonings. Relishes. °Fats and Oils °Butter, stick margarine, lard, shortening, ghee, and bacon fat. Coconut, palm kernel, or palm oils. Regular salad dressings. °Other °Pickles and olives. Salted popcorn and pretzels. °The items listed above may not be a complete list of foods and beverages to avoid. Contact your dietitian for more information. °WHERE CAN I FIND MORE INFORMATION? °National Heart, Lung, and [employer] Institute: www.nhlbi.nih.gov/health/health-topics/topics/dash/ °Document Released: 02/25/2011 Document Revised: 07/23/2013 Document Reviewed: 01/10/2013 °ExitCare® Patient Information ©2015 [employer]. This information is not intended to replace advice given to you by your health care provider. Make sure you discuss any questions you have with your health care provider. ° °

## 2014-12-13 ENCOUNTER — Other Ambulatory Visit: Payer: Self-pay

## 2014-12-13 DIAGNOSIS — C61 Malignant neoplasm of prostate: Secondary | ICD-10-CM

## 2014-12-13 NOTE — Progress Notes (Signed)
L[facility]

## 2014-12-13 NOTE — Progress Notes (Signed)
Called patient and informed him that sleep survey was mailed and needed to be filled out and turned back into [place]. Patient's father verbalized understanding.

## 2014-12-23 ENCOUNTER — Telehealth: Payer: Self-pay | Admitting: Family Medicine

## 2014-12-23 NOTE — Telephone Encounter (Signed)
Pt states at his last visit you said you would be mailing him a Sleep study questionaire to review an answer.   Can we please mail this to the patients address

## 2014-12-23 NOTE — Telephone Encounter (Signed)
[place] questionnaire mailed to pt. Pt notified.

## 2014-12-27 ENCOUNTER — Other Ambulatory Visit: Payer: Self-pay | Admitting: Family Medicine

## 2014-12-27 ENCOUNTER — Telehealth: Payer: Self-pay | Admitting: Family Medicine

## 2014-12-27 DIAGNOSIS — R29818 Other symptoms and signs involving the nervous system: Secondary | ICD-10-CM

## 2014-12-27 NOTE — Telephone Encounter (Signed)
Pt dropped off a berlin questionnaire. Message in box.

## 2014-12-29 NOTE — Telephone Encounter (Signed)
I reviewed over the questionnaire. Patient is active significant risk for sleep apnea. He needs a sleep study. Split protocol if possible. Suspected sleep apnea. Nurse's-please inform patient, help set up referral for the test, be certain to have [place] track for the results of the tests. Also let patient know about the process. Thank you

## 2014-12-30 NOTE — Telephone Encounter (Signed)
Notified patient Dr. [name] reviewed over the questionnaire. Patient is active significant risk for sleep apnea. He needs a sleep study. Split protocol if possible. Suspected sleep apnea. Referral in system. Patient verbalized understanding. Message also forwarded to [facility].

## 2014-12-30 NOTE — Telephone Encounter (Signed)
[facility]

## 2015-01-21 IMAGING — CR DG HIP COMPLETE 2+V*R*
3 series · 3 of 3 positions shown · non-contrast
Comparison: None.

CLINICAL DATA: Right hip pain

RIGHT HIP - COMPLETE 2+ VIEW

[view not recorded (1 of 3)]
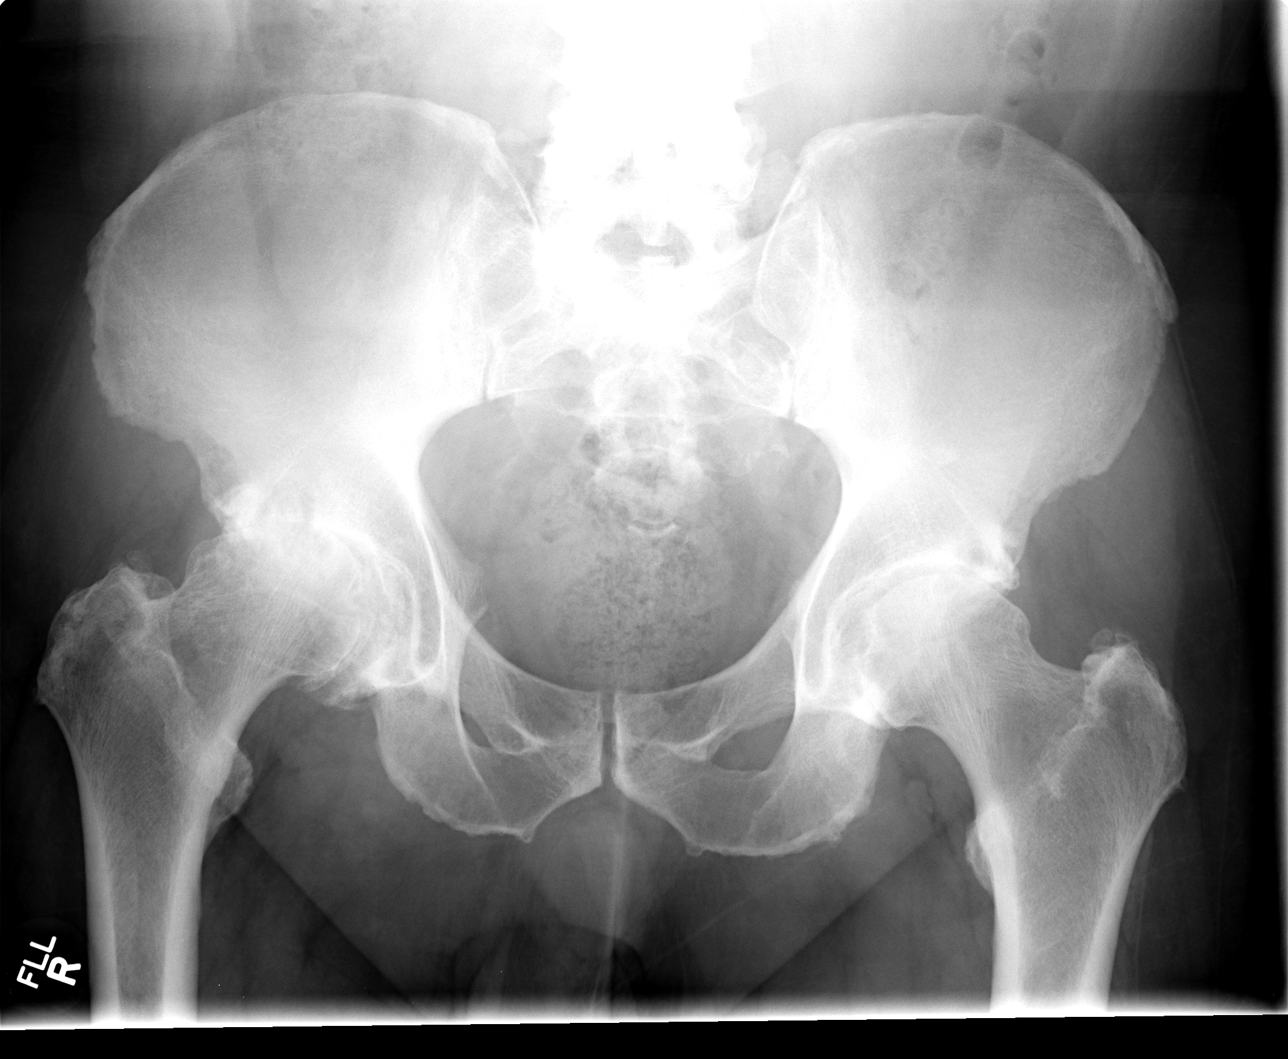

[view not recorded (2 of 3)]
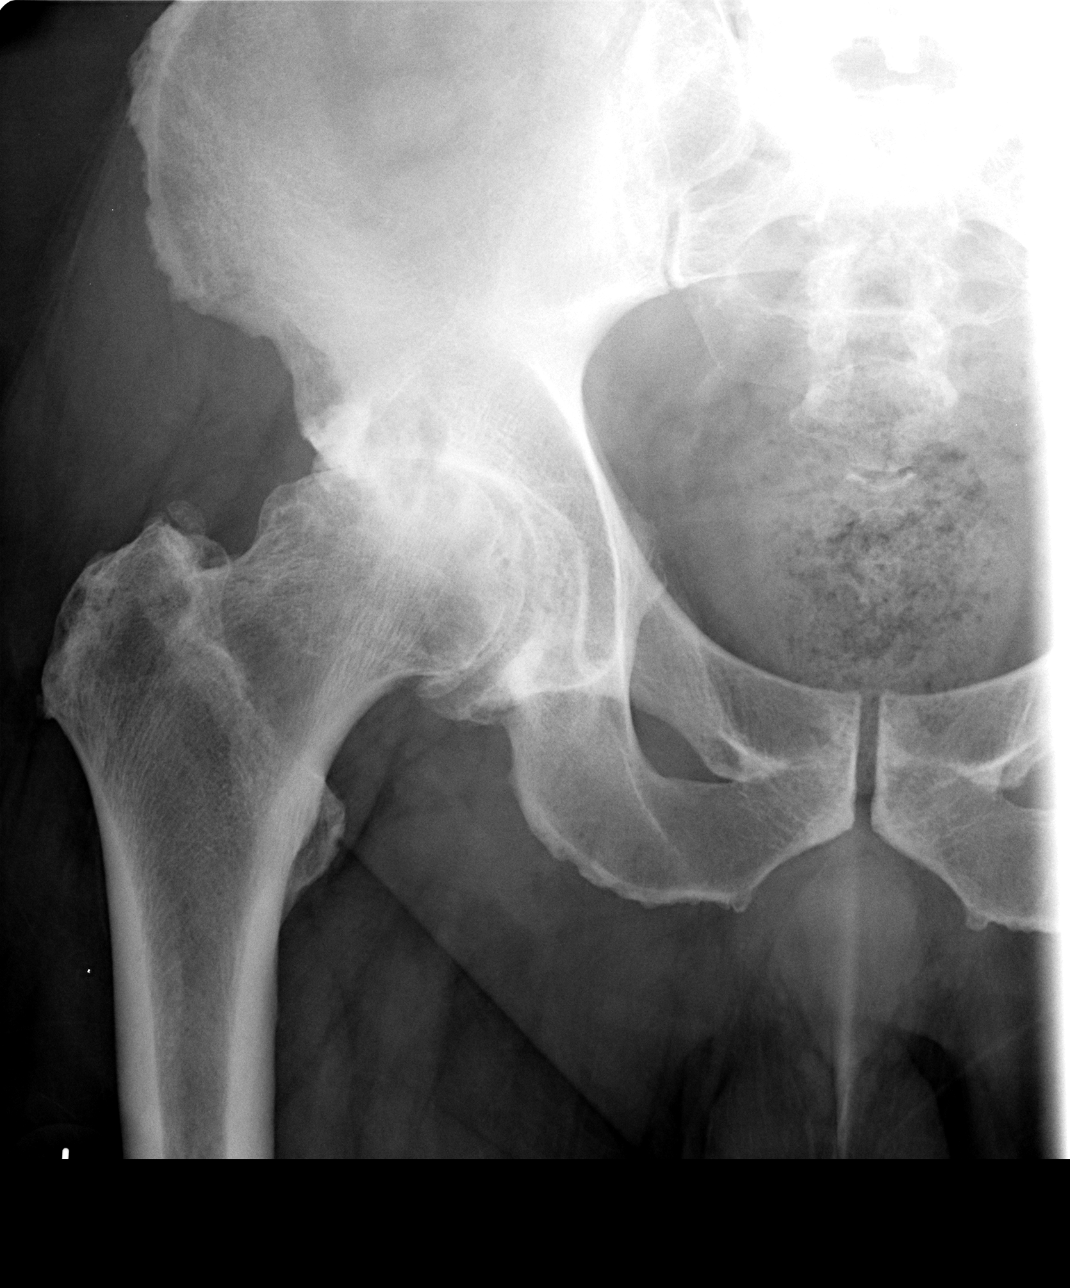

[view not recorded (3 of 3)]
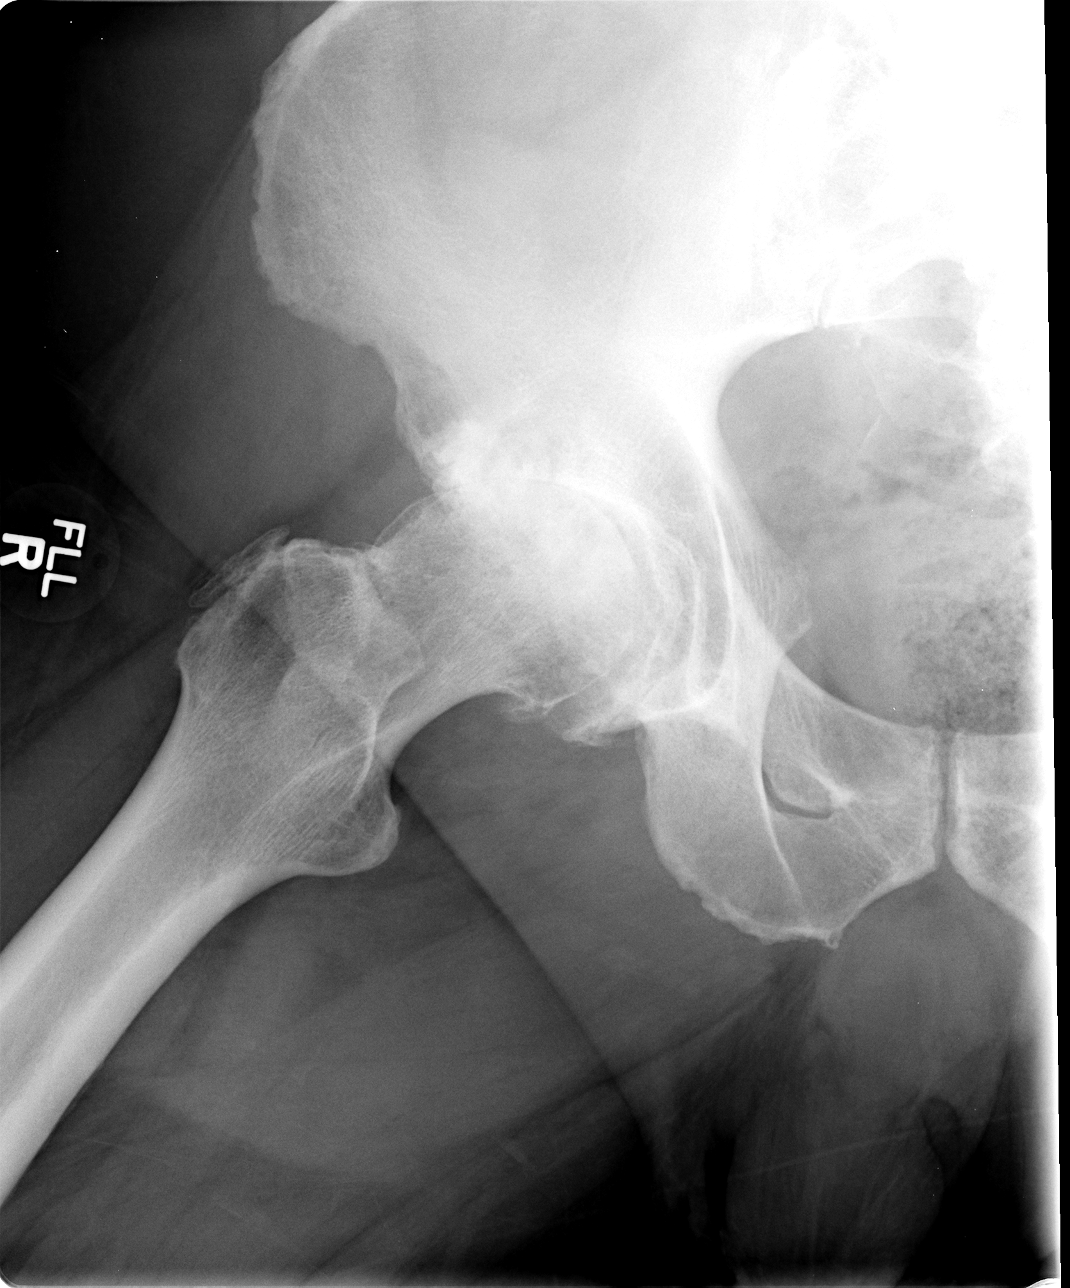

[3 of 3 positions shown; findings below may reference images not displayed]

FINDINGS: Advanced right hip degenerative arthritis with loss of
joint space superiorly and bone on bone contact.  Diffuse sclerosis
of the acetabulum and the superior femoral head margin.
Subchondral cyst formation also noted of the superior acetabulum
and the femoral head.  Proximal right femoral neck bony spurring
evident.  No associated fracture or malalignment.

Minor early degenerative changes of the left hip.  Bony pelvis
intact.
IMPRESSION: Advanced right hip degenerative joint disease.

## 2015-01-24 DIAGNOSIS — E291 Testicular hypofunction: Secondary | ICD-10-CM | POA: Diagnosis not present

## 2015-01-24 DIAGNOSIS — Z8546 Personal history of malignant neoplasm of prostate: Secondary | ICD-10-CM | POA: Diagnosis not present

## 2015-01-31 ENCOUNTER — Ambulatory Visit (INDEPENDENT_AMBULATORY_CARE_PROVIDER_SITE_OTHER): Payer: Commercial Managed Care - HMO | Admitting: Urology

## 2015-01-31 DIAGNOSIS — E291 Testicular hypofunction: Secondary | ICD-10-CM | POA: Diagnosis not present

## 2015-01-31 DIAGNOSIS — R6882 Decreased libido: Secondary | ICD-10-CM | POA: Diagnosis not present

## 2015-01-31 DIAGNOSIS — Z8546 Personal history of malignant neoplasm of prostate: Secondary | ICD-10-CM | POA: Diagnosis not present

## 2015-02-21 ENCOUNTER — Other Ambulatory Visit: Payer: Self-pay | Admitting: Family Medicine

## 2015-03-03 ENCOUNTER — Telehealth: Payer: Self-pay | Admitting: Family Medicine

## 2015-03-03 DIAGNOSIS — M25559 Pain in unspecified hip: Secondary | ICD-10-CM

## 2015-03-03 NOTE — Telephone Encounter (Signed)
Pt is needing a referral to Dr. Aline Brochure for hip pain.

## 2015-03-03 NOTE — Telephone Encounter (Signed)
This patient referral. It is important to educate patient take a little while before specialist see them which is based upon the specialist schedule

## 2015-03-03 NOTE — Telephone Encounter (Signed)
Called patient and informed him per Dr.Scott Luking- referral has been put in for [place]. Someone in our office or [place] office should be contacting him within about 7 days. Patient verbalized understanding.

## 2015-03-04 ENCOUNTER — Ambulatory Visit (INDEPENDENT_AMBULATORY_CARE_PROVIDER_SITE_OTHER): Payer: Commercial Managed Care - HMO

## 2015-03-04 DIAGNOSIS — Z23 Encounter for immunization: Secondary | ICD-10-CM

## 2015-03-05 ENCOUNTER — Ambulatory Visit: Payer: Commercial Managed Care - HMO

## 2015-03-27 ENCOUNTER — Ambulatory Visit (INDEPENDENT_AMBULATORY_CARE_PROVIDER_SITE_OTHER): Payer: Commercial Managed Care - HMO

## 2015-03-27 ENCOUNTER — Ambulatory Visit (INDEPENDENT_AMBULATORY_CARE_PROVIDER_SITE_OTHER): Payer: Commercial Managed Care - HMO | Admitting: Orthopedic Surgery

## 2015-03-27 ENCOUNTER — Encounter: Payer: Self-pay | Admitting: Orthopedic Surgery

## 2015-03-27 VITALS — BP 133/65 | Ht 68.0 in | Wt 220.0 lb

## 2015-03-27 DIAGNOSIS — M1990 Unspecified osteoarthritis, unspecified site: Secondary | ICD-10-CM

## 2015-03-27 DIAGNOSIS — M1610 Unilateral primary osteoarthritis, unspecified hip: Secondary | ICD-10-CM

## 2015-03-27 DIAGNOSIS — Z96641 Presence of right artificial hip joint: Secondary | ICD-10-CM | POA: Diagnosis not present

## 2015-03-27 NOTE — Progress Notes (Signed)
Patient ID: [name] [name], male   DOB: 17-Dec-1950, 66 y.o.   MRN: [id number]  Chief Complaint  Patient presents with  . Follow-up    1 year follow up Rt THA, DOS 04/20/13    HPI [name] [name] is a 65 y.o. male. for his routine annual 2 year follow-up after right total hip Status post right total hip arthroplasty. The patient is doing well the hip implant is functioning well.  Review of Systems Review of Systems  Complains of pain left buttock while sitting, denies groin pain. Does have some pain in the anterior thigh and groin with stairclimbing. Has a history of lumbar disc disease x-ray taken back in 2014   Past Medical History  Diagnosis Date  . Chronic kidney disease     prostate cancer   . Arthritis     right knee and hip   . Cancer     prostate cancer   . Hyperlipidemia   . Hyperglycemia   . Gout   . Hypertension   . Hypercholesteremia   . Diabetes mellitus without complication   . Gout   . GERD (gastroesophageal reflux disease)     Past Surgical History  Procedure Laterality Date  . Hernia repair      right inguinal hernia repair   . Other surgical history      deviated septum surgery   . Robot assisted laparoscopic radical prostatectomy  04/20/2011    Procedure: ROBOTIC ASSISTED LAPAROSCOPIC RADICAL PROSTATECTOMY;  Surgeon: Malka So, MD;  Location: [facility];  Service: Urology;  Laterality: N/A;  Bilateral lymph node dissection  . Colonoscopy    . Nasal septum surgery    . Total hip arthroplasty Right 04/20/2013    Procedure: TOTAL HIP ARTHROPLASTY;  Surgeon: Carole Civil, MD;  Location: [facility] ORS;  Service: Orthopedics;  Laterality: Right;  . Colonoscopy N/A 10/22/2013    Procedure: COLONOSCOPY;  Surgeon: Danie Binder, MD;  Location: [facility] SUITE;  Service: Endoscopy;  Laterality: N/A;  1115     Allergies  Allergen Reactions  . Lipitor [Atorvastatin]     Myalgia     Current Outpatient Prescriptions  Medication Sig Dispense Refill  .  allopurinol (ZYLOPRIM) 100 MG tablet TAKE 1 TABLET EVERY DAY 90 tablet 0  . aspirin 81 MG tablet Take 81 mg by mouth daily.    [name] atorvastatin (LIPITOR) 40 MG tablet TAKE 1 TABLET EVERY DAY 90 tablet 1  . B Complex-C (SUPER B COMPLEX PO) Take 1 tablet by mouth daily.    . Calcium Carbonate-Vitamin D (CALCIUM-VITAMIN D) 500-200 MG-UNIT per tablet Take 1 tablet by mouth daily.    [name] lisinopril-hydrochlorothiazide (PRINZIDE,ZESTORETIC) 20-25 MG per tablet Take 1 tablet by mouth daily. 90 tablet 3  . Multiple Vitamin (MULTIVITAMIN) tablet Take 1 tablet by mouth daily.     . naproxen sodium (ANAPROX) 220 MG tablet Take 220 mg by mouth at bedtime.    . ranitidine (ZANTAC) 150 MG capsule Take 150 mg by mouth 2 (two) times daily.    . vitamin E 400 UNIT capsule Take 400 Units by mouth daily.      No current facility-administered medications for this visit.      Physical Exam Blood pressure 133/65, height 5\' 8"  (1.727 m), weight 220 lb (99.791 kg).  Overall appearance normal grooming and hygiene. The patient is awake alert and oriented 3 with a pleasant mood and affect. The patient is ambulatory without assistive device and without limping.  Right hip-Skin incision is clean dry and intact. Muscle tone is normal and the hip is stable. Flexion is 120  Left hip-full hip flexion no pain or tenderness  Lumbar spine mild tenderness left buttock lumbar spine interspaces nontender   Data Reviewed Today's imaging shows stable total hip implant without loosening or complication  Assessment    Stable right total hip  Mild sciatic nerve irritation from lumbar disc disease    Plan    Follow-up one year no need for left hip replacement as symptoms not warranting.       [name] 03/27/2015, 10:29 AM

## 2015-04-02 DIAGNOSIS — Z79899 Other long term (current) drug therapy: Secondary | ICD-10-CM | POA: Diagnosis not present

## 2015-04-02 DIAGNOSIS — R7309 Other abnormal glucose: Secondary | ICD-10-CM | POA: Diagnosis not present

## 2015-04-02 DIAGNOSIS — E785 Hyperlipidemia, unspecified: Secondary | ICD-10-CM | POA: Diagnosis not present

## 2015-04-03 LAB — BASIC METABOLIC PANEL
BUN / CREAT RATIO: 14 (ref 10–22)
BUN: 17 mg/dL (ref 8–27)
CHLORIDE: 99 mmol/L (ref 96–106)
CO2: 26 mmol/L (ref 18–29)
CREATININE: 1.24 mg/dL (ref 0.76–1.27)
Calcium: 9 mg/dL (ref 8.6–10.2)
GFR calc Af Amer: 70 mL/min/{1.73_m2} (ref >59)
GFR calc non Af Amer: 60 mL/min/{1.73_m2} (ref >59)
GLUCOSE: 89 mg/dL (ref 65–99)
Potassium: 4.7 mmol/L (ref 3.5–5.2)
Sodium: 142 mmol/L (ref 134–144)

## 2015-04-03 LAB — LIPID PANEL
CHOLESTEROL TOTAL: 163 mg/dL (ref 100–199)
Chol/HDL Ratio: 4 ratio units (ref 0.0–5.0)
HDL: 41 mg/dL (ref >39)
LDL Calculated: 92 mg/dL (ref 0–99)
Triglycerides: 152 mg/dL — ABNORMAL HIGH (ref 0–149)
VLDL CHOLESTEROL CAL: 30 mg/dL (ref 5–40)

## 2015-04-03 LAB — HEPATIC FUNCTION PANEL
ALBUMIN: 4.4 g/dL (ref 3.6–4.8)
ALK PHOS: 84 IU/L (ref 39–117)
ALT: 21 IU/L (ref 0–44)
AST: 22 IU/L (ref 0–40)
BILIRUBIN, DIRECT: 0.21 mg/dL (ref 0.00–0.40)
Bilirubin Total: 1 mg/dL (ref 0.0–1.2)
Total Protein: 7 g/dL (ref 6.0–8.5)

## 2015-04-03 LAB — MICROALBUMIN / CREATININE URINE RATIO: CREATININE, UR: 105.7 mg/dL

## 2015-04-07 ENCOUNTER — Ambulatory Visit (INDEPENDENT_AMBULATORY_CARE_PROVIDER_SITE_OTHER): Payer: Medicare HMO | Admitting: Family Medicine

## 2015-04-07 ENCOUNTER — Encounter: Payer: Self-pay | Admitting: Family Medicine

## 2015-04-07 VITALS — BP 112/70 | Ht 68.0 in | Wt 219.2 lb

## 2015-04-07 DIAGNOSIS — E785 Hyperlipidemia, unspecified: Secondary | ICD-10-CM | POA: Diagnosis not present

## 2015-04-07 DIAGNOSIS — I10 Essential (primary) hypertension: Secondary | ICD-10-CM

## 2015-04-07 MED ORDER — ATORVASTATIN CALCIUM 40 MG PO TABS: 40.0000 mg | ORAL_TABLET | Freq: Every day | ORAL | Status: DC

## 2015-04-07 MED ORDER — LISINOPRIL-HYDROCHLOROTHIAZIDE 20-25 MG PO TABS: 1.0000 | ORAL_TABLET | Freq: Every day | ORAL | Status: DC

## 2015-04-07 MED ORDER — ALLOPURINOL 100 MG PO TABS: 100.0000 mg | ORAL_TABLET | Freq: Every day | ORAL | Status: DC

## 2015-04-07 NOTE — Progress Notes (Signed)
   Subjective:    Patient ID: [name] [name], male    DOB: 26-Feb-1951, 65 y.o.   MRN: [id number]  Hyperlipidemia This is a chronic problem. The current episode started more than 1 year ago. Pertinent negatives include no chest pain. There are no compliance problems.    Patient had recently labs drawn on 04/02/15. Patient has concerns of hands going numb periodically.   this happens intermittently sometimes waking up sometimes when he is watching TV  He states he tries to stay physically active watches his diet fairly well takes his medicines as directed  Review of Systems  Constitutional: Negative for activity change, appetite change and fatigue.  HENT: Negative for congestion.   Respiratory: Negative for cough.   Cardiovascular: Negative for chest pain.  Gastrointestinal: Negative for abdominal pain.  Endocrine: Negative for polydipsia and polyphagia.  Neurological: Negative for weakness.  Psychiatric/Behavioral: Negative for confusion.       Objective:   Physical Exam  Constitutional: He appears well-nourished. No distress.  Cardiovascular: Normal rate, regular rhythm and normal heart sounds.   No murmur heard. Pulmonary/Chest: Effort normal and breath sounds normal. No respiratory distress.  Musculoskeletal: He exhibits no edema.  Lymphadenopathy:    He has no cervical adenopathy.  Neurological: He is alert.  Psychiatric: His behavior is normal.  Vitals reviewed.         Assessment & Plan:   hypertension good control continue current measures watch diet stay physically active  Hyperlipidemia lab work looks great current measures  no hypoglycemia on this lab work watch diet stay active  intermittent numbness in the hands could be early carpal tunnel no loss of strength pulses are good if this becomes more frequent I would recommend nerve conduction studies patient understands  wellness exam in 6 months

## 2015-04-24 ENCOUNTER — Ambulatory Visit: Payer: Commercial Managed Care - HMO | Admitting: Orthopedic Surgery

## 2015-06-05 ENCOUNTER — Other Ambulatory Visit (HOSPITAL_COMMUNITY): Payer: Self-pay | Admitting: Respiratory Therapy

## 2015-06-05 DIAGNOSIS — R5383 Other fatigue: Secondary | ICD-10-CM

## 2015-06-05 DIAGNOSIS — R0683 Snoring: Secondary | ICD-10-CM

## 2015-06-05 DIAGNOSIS — G4730 Sleep apnea, unspecified: Secondary | ICD-10-CM

## 2015-06-10 DIAGNOSIS — H2510 Age-related nuclear cataract, unspecified eye: Secondary | ICD-10-CM | POA: Diagnosis not present

## 2015-06-10 DIAGNOSIS — H5210 Myopia, unspecified eye: Secondary | ICD-10-CM | POA: Diagnosis not present

## 2015-06-10 DIAGNOSIS — I10 Essential (primary) hypertension: Secondary | ICD-10-CM | POA: Diagnosis not present

## 2015-06-10 DIAGNOSIS — Z0100 Encounter for examination of eyes and vision without abnormal findings: Secondary | ICD-10-CM | POA: Diagnosis not present

## 2015-06-10 DIAGNOSIS — H5200 Hypermetropia, unspecified eye: Secondary | ICD-10-CM | POA: Diagnosis not present

## 2015-07-10 ENCOUNTER — Other Ambulatory Visit (HOSPITAL_COMMUNITY): Payer: Self-pay | Admitting: Respiratory Therapy

## 2015-07-10 DIAGNOSIS — R0683 Snoring: Secondary | ICD-10-CM

## 2015-07-10 DIAGNOSIS — G4730 Sleep apnea, unspecified: Secondary | ICD-10-CM

## 2015-07-10 DIAGNOSIS — R5383 Other fatigue: Secondary | ICD-10-CM

## 2015-07-18 ENCOUNTER — Other Ambulatory Visit (HOSPITAL_COMMUNITY): Payer: Self-pay | Admitting: Respiratory Therapy

## 2015-08-01 ENCOUNTER — Ambulatory Visit: Payer: Commercial Managed Care - HMO | Attending: Family Medicine | Admitting: Neurology

## 2015-08-01 ENCOUNTER — Encounter: Payer: Self-pay | Admitting: Neurology

## 2015-08-01 DIAGNOSIS — G4730 Sleep apnea, unspecified: Secondary | ICD-10-CM

## 2015-08-01 DIAGNOSIS — R0683 Snoring: Secondary | ICD-10-CM | POA: Diagnosis not present

## 2015-08-01 DIAGNOSIS — R5383 Other fatigue: Secondary | ICD-10-CM | POA: Diagnosis not present

## 2015-08-01 DIAGNOSIS — G4733 Obstructive sleep apnea (adult) (pediatric): Secondary | ICD-10-CM | POA: Diagnosis not present

## 2015-08-10 NOTE — Procedures (Signed)
[place] A. Merlene Laughter, MD     [email or web address]             NOCTURNAL POLYSOMNOGRAPHY   LOCATION: [facility]  Patient Name: [name] [name] Date: 08/03/2015 Gender: Male D.O.B: 03/26/1955 Age (years): 24 Referring Provider: Not Available Height (inches): 71 Interpreting Physician: Phillips Odor MD, ABSM Weight (lbs): 249 RPSGT: Rosebud Poles BMI: 35 MRN: [id number] Neck Size: 17.00 CLINICAL INFORMATION Sleep Study Type: Split Night CPAP Indication for sleep study: Fatigue, Snoring Epworth Sleepiness Score: 15 SLEEP STUDY TECHNIQUE As per the AASM Manual for the Scoring of Sleep and Associated Events v2.3 (April 2016) with a hypopnea requiring 4% desaturations. The channels recorded and monitored were frontal, central and occipital EEG, electrooculogram (EOG), submentalis EMG (chin), nasal and oral airflow, thoracic and abdominal wall motion, anterior tibialis EMG, snore microphone, electrocardiogram, and pulse oximetry. Continuous positive airway pressure (CPAP) was initiated when the patient met split night criteria and was titrated according to treat sleep-disordered breathing. MEDICATIONS Medications taken by the patient : N/A Medications administered by patient during sleep study : No sleep medicine administered.  Current outpatient prescriptions:  .  allopurinol (ZYLOPRIM) 100 MG tablet, Take 1 tablet (100 mg total) by mouth daily., Disp: 90 tablet, Rfl: 3 .  aspirin 81 MG tablet, Take 81 mg by mouth daily., Disp: , Rfl:  .  atorvastatin (LIPITOR) 40 MG tablet, Take 1 tablet (40 mg total) by mouth daily., Disp: 90 tablet, Rfl: 2 .  B Complex-C (SUPER B COMPLEX PO), Take 1 tablet by mouth daily., Disp: , Rfl:  .  Calcium Carbonate-Vitamin D (CALCIUM-VITAMIN D) 500-200 MG-UNIT per tablet, Take 1 tablet by mouth daily., Disp: , Rfl:  .  lisinopril-hydrochlorothiazide (PRINZIDE,ZESTORETIC) 20-25 MG tablet, Take 1 tablet by mouth daily., Disp: 90  tablet, Rfl: 3 .  Multiple Vitamin (MULTIVITAMIN) tablet, Take 1 tablet by mouth daily. , Disp: , Rfl:  .  naproxen sodium (ANAPROX) 220 MG tablet, Take 220 mg by mouth at bedtime., Disp: , Rfl:  .  Omega-3 Fatty Acids (FISH OIL) 1000 MG CAPS, Take by mouth., Disp: , Rfl:  .  ranitidine (ZANTAC) 150 MG capsule, Take 150 mg by mouth 2 (two) times daily., Disp: , Rfl:  .  vitamin E 400 UNIT capsule, Take 400 Units by mouth daily. , Disp: , Rfl:   RESPIRATORY PARAMETERS Diagnostic Total AHI (/hr): 18.0 RDI (/hr): 18.0 OA Index (/hr): 3.7 CA Index (/hr): 0.5 REM AHI (/hr): 10.9 NREM AHI (/hr): 18.7 Supine AHI (/hr): 72.5 Non-supine AHI (/hr): 7.21 Min O2 Sat (%): 85.00 Mean O2 (%): 94.22 Time below 88% (min): 0.2     Titration Optimal Pressure (cm): 8 AHI at Optimal Pressure (/hr): 4.5 Min O2 at Optimal Pressure (%): 91.00 Supine % at Optimal (%): N/A Sleep % at Optimal (%): N/A     SLEEP ARCHITECTURE The recording time for the entire night was 413.6 minutes. During a baseline period of 153.9 minutes, the patient slept for 129.6 minutes in REM and nonREM, yielding a sleep efficiency of 84.2%. Sleep onset after lights out was 5.3 minutes with a REM latency of 107.0 minutes. The patient spent 7.44% of the night in stage N1 sleep, 69.81% in stage N2 sleep, 14.27% in stage N3 and 8.48% in REM. During the titration period of 258.7 minutes, the patient slept for 217.4 minutes in REM and nonREM, yielding a sleep efficiency of 84.0%. Sleep onset after CPAP initiation was 0.8 minutes with a REM latency of 91.0 minutes.  The patient spent 6.21% of the night in stage N1 sleep, 56.35% in stage N2 sleep, 14.03% in stage N3 and 23.41% in REM. CARDIAC DATA The 2 lead EKG demonstrated sinus rhythm. The mean heart rate was N/A beats per minute. Other EKG findings include: None. LEG MOVEMENT DATA The total Periodic Limb Movements of Sleep (PLMS) were 0. The PLMS index was 0.00.   IMPRESSIONS - Moderate  obstructive sleep apnea occurred during the diagnostic portion of the study(AHI = 18.0/hour). An optimal CPAP of 8 was reached.   Delano Metz, MD Diplomate, American Board of Sleep Medicine.

## 2015-08-11 DIAGNOSIS — Z8546 Personal history of malignant neoplasm of prostate: Secondary | ICD-10-CM | POA: Diagnosis not present

## 2015-08-11 DIAGNOSIS — E291 Testicular hypofunction: Secondary | ICD-10-CM | POA: Diagnosis not present

## 2015-08-15 ENCOUNTER — Ambulatory Visit (INDEPENDENT_AMBULATORY_CARE_PROVIDER_SITE_OTHER): Payer: Commercial Managed Care - HMO | Admitting: Urology

## 2015-08-15 DIAGNOSIS — Z8546 Personal history of malignant neoplasm of prostate: Secondary | ICD-10-CM | POA: Diagnosis not present

## 2015-08-15 DIAGNOSIS — R6882 Decreased libido: Secondary | ICD-10-CM

## 2015-08-15 DIAGNOSIS — E291 Testicular hypofunction: Secondary | ICD-10-CM | POA: Diagnosis not present

## 2015-08-15 DIAGNOSIS — N5231 Erectile dysfunction following radical prostatectomy: Secondary | ICD-10-CM

## 2015-08-19 ENCOUNTER — Telehealth: Payer: Self-pay | Admitting: Family Medicine

## 2015-08-19 DIAGNOSIS — G4730 Sleep apnea, unspecified: Secondary | ICD-10-CM

## 2015-08-19 NOTE — Telephone Encounter (Signed)
Please put in referral for sleep apnea. Forward to [place] thank you

## 2015-08-19 NOTE — Telephone Encounter (Signed)
DR [place]'s office called to say they did an sleep study on this PT  Under the impression they did not need a referral, however insurance Is now saying they need one   Please send   Test done May 12th

## 2015-08-20 NOTE — Procedures (Deleted)
CORRECTED SLEEP NOTE ON PRIOR NOTE FROM 08/10/2015      [facility] Kofi A. Merlene Laughter, MD     [email or web address]             NOCTURNAL POLYSOMNOGRAPHY   LOCATION: [facility]    Patient Name: [name] [name], [name] [name] Date: 08/01/2015 Gender: Male D.O.B: 21-Mar-1951 Age (years): 89 Referring Provider: Not Available Height (inches): 68 Interpreting Physician: Phillips Odor MD, ABSM Weight (lbs): 215 RPSGT: Peak, Robert BMI: 33 MRN: [id number] Neck Size: 17.00 CLINICAL INFORMATION Sleep Study Type: Split Night CPAP Indication for sleep study: Fatigue, Snoring Epworth Sleepiness Score: 6 SLEEP STUDY TECHNIQUE As per the AASM Manual for the Scoring of Sleep and Associated Events v2.3 (April 2016) with a hypopnea requiring 4% desaturations. The channels recorded and monitored were frontal, central and occipital EEG, electrooculogram (EOG), submentalis EMG (chin), nasal and oral airflow, thoracic and abdominal wall motion, anterior tibialis EMG, snore microphone, electrocardiogram, and pulse oximetry. Continuous positive airway pressure (CPAP) was initiated when the patient met split night criteria and was titrated according to treat sleep-disordered breathing. MEDICATIONS Medications taken by the patient : N/A Medications administered by patient during sleep study : No sleep medicine administered.  Current outpatient prescriptions:  .  allopurinol (ZYLOPRIM) 100 MG tablet, Take 1 tablet (100 mg total) by mouth daily., Disp: 90 tablet, Rfl: 3 .  aspirin 81 MG tablet, Take 81 mg by mouth daily., Disp: , Rfl:  .  atorvastatin (LIPITOR) 40 MG tablet, Take 1 tablet (40 mg total) by mouth daily., Disp: 90 tablet, Rfl: 2 .  B Complex-C (SUPER B COMPLEX PO), Take 1 tablet by mouth daily., Disp: , Rfl:  .  Calcium Carbonate-Vitamin D (CALCIUM-VITAMIN D) 500-200 MG-UNIT per tablet, Take 1 tablet by mouth daily., Disp: , Rfl:  .  lisinopril-hydrochlorothiazide (PRINZIDE,ZESTORETIC)  20-25 MG tablet, Take 1 tablet by mouth daily., Disp: 90 tablet, Rfl: 3 .  Multiple Vitamin (MULTIVITAMIN) tablet, Take 1 tablet by mouth daily. , Disp: , Rfl:  .  naproxen sodium (ANAPROX) 220 MG tablet, Take 220 mg by mouth at bedtime., Disp: , Rfl:  .  Omega-3 Fatty Acids (FISH OIL) 1000 MG CAPS, Take by mouth., Disp: , Rfl:  .  ranitidine (ZANTAC) 150 MG capsule, Take 150 mg by mouth 2 (two) times daily., Disp: , Rfl:  .  vitamin E 400 UNIT capsule, Take 400 Units by mouth daily. , Disp: , Rfl:   RESPIRATORY PARAMETERS Diagnostic Total AHI (/hr): 74.6 RDI (/hr): 74.6 OA Index (/hr): 67.7 CA Index (/hr): 2.0 REM AHI (/hr): 75.4 NREM AHI (/hr): 74.4 Supine AHI (/hr): 0.0 Non-supine AHI (/hr): 74.57 Min O2 Sat (%): 77.00 Mean O2 (%): 92.13 Time below 88% (min): 14.0     Titration: Titrated between 5- 13. At 10 obstructive events were eliminated but central events emerged above this pressure.  Optimal Pressure (cm):   AHI at Optimal Pressure (/hr): N/A Min O2 at Optimal Pressure (%): 85.00 Supine % at Optimal (%): N/A Sleep % at Optimal (%): N/A     SLEEP ARCHITECTURE The recording time for the entire night was 391.3 minutes. During a baseline period of 182.9 minutes, the patient slept for 121.5 minutes in REM and nonREM, yielding a sleep efficiency of 66.4%. Sleep onset after lights out was 38.2 minutes with a REM latency of 29.5 minutes. The patient spent 22.22% of the night in stage N1 sleep, 63.37% in stage N2 sleep, 0.00% in stage N3 and 14.40% in REM. During the titration period  of 203.9 minutes, the patient slept for 156.5 minutes in REM and nonREM, yielding a sleep efficiency of 76.7%. Sleep onset after CPAP initiation was 7.9 minutes with a REM latency of 60.5 minutes. The patient spent 20.13% of the night in stage N1 sleep, 40.26% in stage N2 sleep, 0.00% in stage N3 and 39.62% in REM. CARDIAC DATA The 2 lead EKG demonstrated sinus rhythm. The mean heart rate was 75.00 beats per minute.  Other EKG findings include: None. LEG MOVEMENT DATA The total Periodic Limb Movements of Sleep (PLMS) were 0. The PLMS index was 0.00.    IMPRESSIONS - Severe obstructive sleep apnea occurred during the diagnostic portion of the study (AHI = 74.6/hour). An optimal PAP pressure could not be selected for this patient based on the available study data.  However, CPAP 10 eliminated obstructive events. I suggest AutoPAP 8-13.    Delano Metz, MD Diplomate, American Board of Sleep Medicine.

## 2015-08-20 NOTE — Procedures (Unsigned)
CORRECTED SLEEP NOTE ON PRIOR NOTE FROM 08/10/2015      [facility] Odyssey Vasbinder A. Zeyad Delaguila, MD     [email or web address]             NOCTURNAL POLYSOMNOGRAPHY   [facility]ATION: [facility]    Patient Name: [name], [name] Study Date: 08/01/2015 Gender: Male D.O.B: 04/26/1950 Age (years): 65 Referring Provider: Not Available Height (inches): 68 Interpreting Physician: Donta Fuster MD, ABSM Weight (lbs): 215 RPSGT: Peak, Robert BMI: 33 MRN: [id number] Neck Size: 17.00 CLINICAL INFORMATION Sleep Study Type: Split Night CPAP Indication for sleep study: Fatigue, Snoring Epworth Sleepiness Score: 6 SLEEP STUDY TECHNIQUE As per the AASM Manual for the Scoring of Sleep and Associated Events v2.3 (April 2016) with a hypopnea requiring 4% desaturations. The channels recorded and monitored were frontal, central and occipital EEG, electrooculogram (EOG), submentalis EMG (chin), nasal and oral airflow, thoracic and abdominal wall motion, anterior tibialis EMG, snore microphone, electrocardiogram, and pulse oximetry. Continuous positive airway pressure (CPAP) was initiated when the patient met split night criteria and was titrated according to treat sleep-disordered breathing. MEDICATIONS Medications taken by the patient : N/A Medications administered by patient during sleep study : No sleep medicine administered.  Current outpatient prescriptions:  .  allopurinol (ZYLOPRIM) 100 MG tablet, Take 1 tablet (100 mg total) by mouth daily., Disp: 90 tablet, Rfl: 3 .  aspirin 81 MG tablet, Take 81 mg by mouth daily., Disp: , Rfl:  .  atorvastatin (LIPITOR) 40 MG tablet, Take 1 tablet (40 mg total) by mouth daily., Disp: 90 tablet, Rfl: 2 .  B Complex-C (SUPER B COMPLEX PO), Take 1 tablet by mouth daily., Disp: , Rfl:  .  Calcium Carbonate-Vitamin D (CALCIUM-VITAMIN D) 500-200 MG-UNIT per tablet, Take 1 tablet by mouth daily., Disp: , Rfl:  .  lisinopril-hydrochlorothiazide (PRINZIDE,ZESTORETIC)  20-25 MG tablet, Take 1 tablet by mouth daily., Disp: 90 tablet, Rfl: 3 .  Multiple Vitamin (MULTIVITAMIN) tablet, Take 1 tablet by mouth daily. , Disp: , Rfl:  .  naproxen sodium (ANAPROX) 220 MG tablet, Take 220 mg by mouth at bedtime., Disp: , Rfl:  .  Omega-3 Fatty Acids (FISH OIL) 1000 MG CAPS, Take by mouth., Disp: , Rfl:  .  ranitidine (ZANTAC) 150 MG capsule, Take 150 mg by mouth 2 (two) times daily., Disp: , Rfl:  .  vitamin E 400 UNIT capsule, Take 400 Units by mouth daily. , Disp: , Rfl:   RESPIRATORY PARAMETERS Diagnostic Total AHI (/hr): 74.6 RDI (/hr): 74.6 OA Index (/hr): 67.7 CA Index (/hr): 2.0 REM AHI (/hr): 75.4 NREM AHI (/hr): 74.4 Supine AHI (/hr): 0.0 Non-supine AHI (/hr): 74.57 Min O2 Sat (%): 77.00 Mean O2 (%): 92.13 Time below 88% (min): 14.0     Titration: Titrated between 5- 13. At 10 obstructive events were eliminated but central events emerged above this pressure.  Optimal Pressure (cm):   AHI at Optimal Pressure (/hr): N/A Min O2 at Optimal Pressure (%): 85.00 Supine % at Optimal (%): N/A Sleep % at Optimal (%): N/A     SLEEP ARCHITECTURE The recording time for the entire night was 391.3 minutes. During a baseline period of 182.9 minutes, the patient slept for 121.5 minutes in REM and nonREM, yielding a sleep efficiency of 66.4%. Sleep onset after lights out was 38.2 minutes with a REM latency of 29.5 minutes. The patient spent 22.22% of the night in stage N1 sleep, 63.37% in stage N2 sleep, 0.00% in stage N3 and 14.40% in REM. During the titration period   of 203.9 minutes, the patient slept for 156.5 minutes in REM and nonREM, yielding a sleep efficiency of 76.7%. Sleep onset after CPAP initiation was 7.9 minutes with a REM latency of 60.5 minutes. The patient spent 20.13% of the night in stage N1 sleep, 40.26% in stage N2 sleep, 0.00% in stage N3 and 39.62% in REM. CARDIAC DATA The 2 lead EKG demonstrated sinus rhythm. The mean heart rate was 75.00 beats per minute.  Other EKG findings include: None. LEG MOVEMENT DATA The total Periodic Limb Movements of Sleep (PLMS) were 0. The PLMS index was 0.00.    IMPRESSIONS - Severe obstructive sleep apnea occurred during the diagnostic portion of the study (AHI = 74.6/hour). An optimal PAP pressure could not be selected for this patient based on the available study data.  However, CPAP 10 eliminated obstructive events. I suggest AutoPAP 8-13.    Ashaya Raftery A Lalena Salas, MD Diplomate, American Board of Sleep Medicine.          

## 2015-08-20 NOTE — Telephone Encounter (Signed)
Nurse's-please call the patient. I spoke with the patient earlier today at his workplace regarding him finishing up a sleep study. Upon further review of this sleep study there are some documentation problems that I feel needs to be cleared up before we know his results. Please let the patient know that we are trying to clear this up with the neurology office. For some reason another patient's name was listed on his test result therefore I do not feel we can come to any conclusions until this is cleared up by the neurology office. The neurologist did a sleep study on the patient. They sent [place] dictation on it but upon further reading of the know take it is apparent that the result was listed under another patient's name. This needs to be corrected and clarified before we can discuss any results with the patient. After speaking with the patient please call neurology office let them know of this problem. Please asked that they correct this report in send [place] a copy of the cleared up report. Finally put this patient's name and this issue in a place where you will follow-up on this early next week to make sure it has been followed through on from the neurologist. Thank you

## 2015-08-20 NOTE — Telephone Encounter (Signed)
Dr [name] spoke with Dr. Merlene Laughter and they will get this corrected. Dr [name] wants to leave message in basket and check on tomorrow to see if this issue was corrected.

## 2015-08-20 NOTE — Sleep Study (Signed)
CORRECTED SLEEP NOTE ON PRIOR NOTE FROM 08/10/2015      [facility] Amario Longmore A. Leahmarie Gasiorowski, MD     [email or web address]             NOCTURNAL POLYSOMNOGRAPHY   LOCATION: [facility]    Patient Name: [name] [name], [name] [name] Study Date: 08/01/2015 Gender: Male D.O.B: 08/05/1950 Age (years): 65 Referring Provider: Not Available Height (inches): 68 Interpreting Physician: Keaton Stirewalt MD, ABSM Weight (lbs): 215 RPSGT: Peak, Robert BMI: 33 MRN: [id number] Neck Size: 17.00 CLINICAL INFORMATION Sleep Study Type: Split Night CPAP Indication for sleep study: Fatigue, Snoring Epworth Sleepiness Score: 6 SLEEP STUDY TECHNIQUE As per the AASM Manual for the Scoring of Sleep and Associated Events v2.3 (April 2016) with a hypopnea requiring 4% desaturations. The channels recorded and monitored were frontal, central and occipital EEG, electrooculogram (EOG), submentalis EMG (chin), nasal and oral airflow, thoracic and abdominal wall motion, anterior tibialis EMG, snore microphone, electrocardiogram, and pulse oximetry. Continuous positive airway pressure (CPAP) was initiated when the patient met split night criteria and was titrated according to treat sleep-disordered breathing. MEDICATIONS Medications taken by the patient : N/A Medications administered by patient during sleep study : No sleep medicine administered.  Current outpatient prescriptions:  .  allopurinol (ZYLOPRIM) 100 MG tablet, Take 1 tablet (100 mg total) by mouth daily., Disp: 90 tablet, Rfl: 3 .  aspirin 81 MG tablet, Take 81 mg by mouth daily., Disp: , Rfl:  .  atorvastatin (LIPITOR) 40 MG tablet, Take 1 tablet (40 mg total) by mouth daily., Disp: 90 tablet, Rfl: 2 .  B Complex-C (SUPER B COMPLEX PO), Take 1 tablet by mouth daily., Disp: , Rfl:  .  Calcium Carbonate-Vitamin D (CALCIUM-VITAMIN D) 500-200 MG-UNIT per tablet, Take 1 tablet by mouth daily., Disp: , Rfl:  .  lisinopril-hydrochlorothiazide (PRINZIDE,ZESTORETIC)  20-25 MG tablet, Take 1 tablet by mouth daily., Disp: 90 tablet, Rfl: 3 .  Multiple Vitamin (MULTIVITAMIN) tablet, Take 1 tablet by mouth daily. , Disp: , Rfl:  .  naproxen sodium (ANAPROX) 220 MG tablet, Take 220 mg by mouth at bedtime., Disp: , Rfl:  .  Omega-3 Fatty Acids (FISH OIL) 1000 MG CAPS, Take by mouth., Disp: , Rfl:  .  ranitidine (ZANTAC) 150 MG capsule, Take 150 mg by mouth 2 (two) times daily., Disp: , Rfl:  .  vitamin E 400 UNIT capsule, Take 400 Units by mouth daily. , Disp: , Rfl:   RESPIRATORY PARAMETERS Diagnostic Total AHI (/hr): 74.6 RDI (/hr): 74.6 OA Index (/hr): 67.7 CA Index (/hr): 2.0 REM AHI (/hr): 75.4 NREM AHI (/hr): 74.4 Supine AHI (/hr): 0.0 Non-supine AHI (/hr): 74.57 Min O2 Sat (%): 77.00 Mean O2 (%): 92.13 Time below 88% (min): 14.0     Titration: Titrated between 5- 13. At 10 obstructive events were eliminated but central events emerged above this pressure.  Optimal Pressure (cm):   AHI at Optimal Pressure (/hr): N/A Min O2 at Optimal Pressure (%): 85.00 Supine % at Optimal (%): N/A Sleep % at Optimal (%): N/A     SLEEP ARCHITECTURE The recording time for the entire night was 391.3 minutes. During a baseline period of 182.9 minutes, the patient slept for 121.5 minutes in REM and nonREM, yielding a sleep efficiency of 66.4%. Sleep onset after lights out was 38.2 minutes with a REM latency of 29.5 minutes. The patient spent 22.22% of the night in stage N1 sleep, 63.37% in stage N2 sleep, 0.00% in stage N3 and 14.40% in REM. During the titration period   of 203.9 minutes, the patient slept for 156.5 minutes in REM and nonREM, yielding a sleep efficiency of 76.7%. Sleep onset after CPAP initiation was 7.9 minutes with a REM latency of 60.5 minutes. The patient spent 20.13% of the night in stage N1 sleep, 40.26% in stage N2 sleep, 0.00% in stage N3 and 39.62% in REM. CARDIAC DATA The 2 lead EKG demonstrated sinus rhythm. The mean heart rate was 75.00 beats per minute.  Other EKG findings include: None. LEG MOVEMENT DATA The total Periodic Limb Movements of Sleep (PLMS) were 0. The PLMS index was 0.00.    IMPRESSIONS - Severe obstructive sleep apnea occurred during the diagnostic portion of the study (AHI = 74.6/hour). An optimal PAP pressure could not be selected for this patient based on the available study data.  However, CPAP 10 eliminated obstructive events. I suggest AutoPAP 8-13.    Alilah Mcmeans A Bao Bazen, MD Diplomate, American Board of Sleep Medicine.          

## 2015-08-20 NOTE — Telephone Encounter (Signed)
Referral for sleep studies in epic

## 2015-08-27 NOTE — Telephone Encounter (Signed)
Results discussed with patient. Patient advised that the corrected report did come back show significant sleep apnea that would benefit from CPAP machine. Please work with the patient regarding where we need to send information for CPAP machine Dr [name] would recommend CPAP machine with auto titration of pressures between 8 and 13 per specialist recommendation. Patient verbalized understanding and would like to proceed with the CPAP machine. Patient states he has [employer].

## 2015-08-27 NOTE — Telephone Encounter (Signed)
Order written & given to Dr. [name] for signature Will fax to [facility]  Ph# [phone]  Fx# [phone]

## 2015-08-27 NOTE — Telephone Encounter (Signed)
Please let the patient know that the corrected report did come back show significant sleep apnea that would benefit from CPAP machine. Please work with the patient regarding where we need to send information for CPAP machine I would recommend CPAP machine with auto titration of pressures between 8 and 13 per specialist recommendation.(Please let me know patient having significant concerns questions I will be happy to speak with him )

## 2015-08-27 NOTE — Telephone Encounter (Signed)
Left message to return call 

## 2015-08-28 ENCOUNTER — Encounter: Payer: Self-pay | Admitting: Family Medicine

## 2015-08-28 NOTE — Telephone Encounter (Signed)
Dictated letter and recent sleep study been sent to the patient via Danae Chen

## 2015-09-10 DIAGNOSIS — G4733 Obstructive sleep apnea (adult) (pediatric): Secondary | ICD-10-CM | POA: Diagnosis not present

## 2015-09-10 DIAGNOSIS — I10 Essential (primary) hypertension: Secondary | ICD-10-CM | POA: Diagnosis not present

## 2015-09-10 DIAGNOSIS — R0683 Snoring: Secondary | ICD-10-CM | POA: Diagnosis not present

## 2015-09-10 DIAGNOSIS — E785 Hyperlipidemia, unspecified: Secondary | ICD-10-CM | POA: Diagnosis not present

## 2015-09-29 ENCOUNTER — Telehealth: Payer: Self-pay | Admitting: Family Medicine

## 2015-09-29 DIAGNOSIS — R7303 Prediabetes: Secondary | ICD-10-CM

## 2015-09-29 DIAGNOSIS — Z79899 Other long term (current) drug therapy: Secondary | ICD-10-CM

## 2015-09-29 DIAGNOSIS — I10 Essential (primary) hypertension: Secondary | ICD-10-CM

## 2015-09-29 DIAGNOSIS — E785 Hyperlipidemia, unspecified: Secondary | ICD-10-CM

## 2015-09-29 NOTE — Telephone Encounter (Signed)
Pt is requesting lab orders to be sent over. Last labs per epic were: lipid,hepatic,bmp and microalbumin on 04/02/15

## 2015-09-29 NOTE — Telephone Encounter (Signed)
Lipid, liver, metabolic 7, hemoglobin C1U

## 2015-09-29 NOTE — Telephone Encounter (Signed)
Blood work orders put in. Pt notified.

## 2015-09-30 DIAGNOSIS — Z79899 Other long term (current) drug therapy: Secondary | ICD-10-CM | POA: Diagnosis not present

## 2015-09-30 DIAGNOSIS — R7303 Prediabetes: Secondary | ICD-10-CM | POA: Diagnosis not present

## 2015-09-30 DIAGNOSIS — I10 Essential (primary) hypertension: Secondary | ICD-10-CM | POA: Diagnosis not present

## 2015-09-30 DIAGNOSIS — E785 Hyperlipidemia, unspecified: Secondary | ICD-10-CM | POA: Diagnosis not present

## 2015-10-01 LAB — LIPID PANEL
CHOLESTEROL TOTAL: 162 mg/dL (ref 100–199)
Chol/HDL Ratio: 3.8 ratio units (ref 0.0–5.0)
HDL: 43 mg/dL (ref >39)
LDL CALC: 91 mg/dL (ref 0–99)
Triglycerides: 139 mg/dL (ref 0–149)
VLDL CHOLESTEROL CAL: 28 mg/dL (ref 5–40)

## 2015-10-01 LAB — BASIC METABOLIC PANEL
BUN/Creatinine Ratio: 15 (ref 10–24)
BUN: 22 mg/dL (ref 8–27)
CO2: 25 mmol/L (ref 18–29)
CREATININE: 1.44 mg/dL — AB (ref 0.76–1.27)
Calcium: 9 mg/dL (ref 8.6–10.2)
Chloride: 97 mmol/L (ref 96–106)
GFR calc Af Amer: 58 mL/min/{1.73_m2} — ABNORMAL LOW (ref >59)
GFR calc non Af Amer: 50 mL/min/{1.73_m2} — ABNORMAL LOW (ref >59)
GLUCOSE: 95 mg/dL (ref 65–99)
POTASSIUM: 4.9 mmol/L (ref 3.5–5.2)
SODIUM: 138 mmol/L (ref 134–144)

## 2015-10-01 LAB — HEPATIC FUNCTION PANEL
ALBUMIN: 4.3 g/dL (ref 3.6–4.8)
ALT: 16 IU/L (ref 0–44)
AST: 22 IU/L (ref 0–40)
Alkaline Phosphatase: 80 IU/L (ref 39–117)
Bilirubin Total: 1.1 mg/dL (ref 0.0–1.2)
Bilirubin, Direct: 0.26 mg/dL (ref 0.00–0.40)
TOTAL PROTEIN: 6.8 g/dL (ref 6.0–8.5)

## 2015-10-01 LAB — HEMOGLOBIN A1C
ESTIMATED AVERAGE GLUCOSE: 120 mg/dL
HEMOGLOBIN A1C: 5.8 % — AB (ref 4.8–5.6)

## 2015-10-06 ENCOUNTER — Ambulatory Visit (INDEPENDENT_AMBULATORY_CARE_PROVIDER_SITE_OTHER): Payer: Commercial Managed Care - HMO | Admitting: Family Medicine

## 2015-10-06 ENCOUNTER — Encounter: Payer: Self-pay | Admitting: Family Medicine

## 2015-10-06 VITALS — BP 118/74 | Ht 66.5 in | Wt 222.0 lb

## 2015-10-06 DIAGNOSIS — E785 Hyperlipidemia, unspecified: Secondary | ICD-10-CM

## 2015-10-06 DIAGNOSIS — I10 Essential (primary) hypertension: Secondary | ICD-10-CM | POA: Diagnosis not present

## 2015-10-06 DIAGNOSIS — M109 Gout, unspecified: Secondary | ICD-10-CM | POA: Diagnosis not present

## 2015-10-06 DIAGNOSIS — Z0000 Encounter for general adult medical examination without abnormal findings: Secondary | ICD-10-CM

## 2015-10-06 DIAGNOSIS — Z0189 Encounter for other specified special examinations: Secondary | ICD-10-CM

## 2015-10-06 DIAGNOSIS — R7303 Prediabetes: Secondary | ICD-10-CM

## 2015-10-06 NOTE — Progress Notes (Signed)
Subjective:    Patient ID: [name] [name], male    DOB: [birth year], 65 y.o.   MRN: [id number]  HPI AWV- Annual Wellness Visit  The patient was seen for their annual wellness visit. The patient's past medical history, surgical history, and family history were reviewed. Pertinent vaccines were reviewed ( tetanus, pneumonia, shingles, flu) The patient's medication list was reviewed and updated.  The height and weight were entered. The patient's current BMI is: 35.29  Cognitive screening was completed. Outcome of Mini - Cog:  pass  Falls within the past 6 months: none  Current tobacco usage:  none (All patients who use tobacco were given written and verbal information on quitting)  Recent listing of emergency department/hospitalizations over the past year were reviewed.  current specialist the patient sees on a regular basis: dr Jeffie Pollock - urology and dr Aline Brochure ortho   Medicare annual wellness visit patient questionnaire was reviewed.  A written screening schedule for the patient for the next 5-10 years was given. Appropriate discussion of followup regarding next visit was discussed.       Review of Systems  Constitutional: Negative for fever, activity change and appetite change.  HENT: Negative for congestion and rhinorrhea.   Eyes: Negative for discharge.  Respiratory: Negative for cough and wheezing.   Cardiovascular: Negative for chest pain.  Gastrointestinal: Negative for vomiting, abdominal pain and blood in stool.  Genitourinary: Negative for frequency and difficulty urinating.  Musculoskeletal: Negative for neck pain.  Skin: Negative for rash.  Allergic/Immunologic: Negative for environmental allergies and food allergies.  Neurological: Negative for weakness and headaches.  Psychiatric/Behavioral: Negative for agitation.       Objective:   Physical Exam  Constitutional: He appears well-developed and well-nourished.  HENT:  Head: Normocephalic and  atraumatic.  Right Ear: External ear normal.  Left Ear: External ear normal.  Nose: Nose normal.  Mouth/Throat: Oropharynx is clear and moist.  Eyes: EOM are normal. Pupils are equal, round, and reactive to light.  Neck: Normal range of motion. Neck supple. No thyromegaly present.  Cardiovascular: Normal rate, regular rhythm and normal heart sounds.   No murmur heard. Pulmonary/Chest: Effort normal and breath sounds normal. No respiratory distress. He has no wheezes.  Abdominal: Soft. Bowel sounds are normal. He exhibits no distension and no mass. There is no tenderness.  Genitourinary: Penis normal.  Musculoskeletal: Normal range of motion. He exhibits no edema.  Lymphadenopathy:    He has no cervical adenopathy.  Neurological: He is alert. He exhibits normal muscle tone.  Skin: Skin is warm and dry. No erythema.  Psychiatric: He has a normal mood and affect. His behavior is normal. Judgment normal.          Assessment & Plan:  Adult wellness-complete.wellness physical was conducted today. Importance of diet and exercise were discussed in detail. In addition to this a discussion regarding safety was also covered. We also reviewed over immunizations and gave recommendations regarding current immunization needed for age. In addition to this additional areas were also touched on including: Preventative health exams needed: Colonoscopy Patient up-to-date on colonoscopy  Patient was advised yearly wellness exam No sign of dementia Patient has severe osteoarthritis more likely will have surgery he will see Dr. Aline Brochure Prediabetes stable patient encourage watch diet exercise try to lose weight Hyperlipidemia patient to continue cholesterol medication. Blood pressure good control continue blood pressure medicine 81 mg as cardiac prophylaxis Uric acid will be checked along with metabolic 7 creatinine slightly elevated patient Will  repeat blood work when he is well hydrated

## 2015-10-09 DIAGNOSIS — I10 Essential (primary) hypertension: Secondary | ICD-10-CM | POA: Diagnosis not present

## 2015-10-09 DIAGNOSIS — M109 Gout, unspecified: Secondary | ICD-10-CM | POA: Diagnosis not present

## 2015-10-10 ENCOUNTER — Encounter: Payer: Self-pay | Admitting: Family Medicine

## 2015-10-10 DIAGNOSIS — I10 Essential (primary) hypertension: Secondary | ICD-10-CM | POA: Diagnosis not present

## 2015-10-10 DIAGNOSIS — E785 Hyperlipidemia, unspecified: Secondary | ICD-10-CM | POA: Diagnosis not present

## 2015-10-10 DIAGNOSIS — R0683 Snoring: Secondary | ICD-10-CM | POA: Diagnosis not present

## 2015-10-10 DIAGNOSIS — G4733 Obstructive sleep apnea (adult) (pediatric): Secondary | ICD-10-CM | POA: Diagnosis not present

## 2015-10-10 LAB — BASIC METABOLIC PANEL
BUN / CREAT RATIO: 17 (ref 10–24)
BUN: 20 mg/dL (ref 8–27)
CHLORIDE: 98 mmol/L (ref 96–106)
CO2: 25 mmol/L (ref 18–29)
Calcium: 9.1 mg/dL (ref 8.6–10.2)
Creatinine, Ser: 1.21 mg/dL (ref 0.76–1.27)
GFR calc Af Amer: 71 mL/min/{1.73_m2} (ref >59)
GFR calc non Af Amer: 62 mL/min/{1.73_m2} (ref >59)
GLUCOSE: 81 mg/dL (ref 65–99)
POTASSIUM: 4.6 mmol/L (ref 3.5–5.2)
SODIUM: 138 mmol/L (ref 134–144)

## 2015-10-10 LAB — URIC ACID: URIC ACID: 6.7 mg/dL (ref 3.7–8.6)

## 2015-11-10 DIAGNOSIS — R0683 Snoring: Secondary | ICD-10-CM | POA: Diagnosis not present

## 2015-11-10 DIAGNOSIS — I10 Essential (primary) hypertension: Secondary | ICD-10-CM | POA: Diagnosis not present

## 2015-11-10 DIAGNOSIS — E785 Hyperlipidemia, unspecified: Secondary | ICD-10-CM | POA: Diagnosis not present

## 2015-11-10 DIAGNOSIS — G4733 Obstructive sleep apnea (adult) (pediatric): Secondary | ICD-10-CM | POA: Diagnosis not present

## 2015-12-01 ENCOUNTER — Telehealth: Payer: Self-pay | Admitting: Family Medicine

## 2015-12-01 MED ORDER — ALLOPURINOL 100 MG PO TABS: 100.0000 mg | ORAL_TABLET | Freq: Every day | ORAL | 0 refills | Status: DC

## 2015-12-01 MED ORDER — ALLOPURINOL 100 MG PO TABS: 100.0000 mg | ORAL_TABLET | Freq: Every day | ORAL | 1 refills | Status: DC

## 2015-12-01 NOTE — Telephone Encounter (Signed)
Left message notifying patient med sent to pharmacy.

## 2015-12-01 NOTE — Telephone Encounter (Signed)
Pt is out of his allopurinol (ZYLOPRIM) 100 MG tablet Pt is needing 90 day supplies sent to [facility] and a 30 day supply sent to [facility] if possible.

## 2015-12-11 DIAGNOSIS — I10 Essential (primary) hypertension: Secondary | ICD-10-CM | POA: Diagnosis not present

## 2015-12-11 DIAGNOSIS — G4733 Obstructive sleep apnea (adult) (pediatric): Secondary | ICD-10-CM | POA: Diagnosis not present

## 2015-12-11 DIAGNOSIS — R0683 Snoring: Secondary | ICD-10-CM | POA: Diagnosis not present

## 2015-12-11 DIAGNOSIS — E785 Hyperlipidemia, unspecified: Secondary | ICD-10-CM | POA: Diagnosis not present

## 2015-12-26 ENCOUNTER — Telehealth: Payer: Self-pay | Admitting: Family Medicine

## 2015-12-26 NOTE — Telephone Encounter (Signed)
[employer] Prior Auth rec'd for pt's CPAP & supplies  Auth# [id number]  Faxed approval to [facility]  Also requested a compliance report so that I can submit PA request for each month's rental per [employer]'s request  Sent approval to be scanned in to chart

## 2016-01-10 DIAGNOSIS — I10 Essential (primary) hypertension: Secondary | ICD-10-CM | POA: Diagnosis not present

## 2016-01-10 DIAGNOSIS — R0683 Snoring: Secondary | ICD-10-CM | POA: Diagnosis not present

## 2016-01-10 DIAGNOSIS — E785 Hyperlipidemia, unspecified: Secondary | ICD-10-CM | POA: Diagnosis not present

## 2016-01-10 DIAGNOSIS — G4733 Obstructive sleep apnea (adult) (pediatric): Secondary | ICD-10-CM | POA: Diagnosis not present

## 2016-01-19 ENCOUNTER — Telehealth: Payer: Self-pay | Admitting: *Deleted

## 2016-01-19 ENCOUNTER — Ambulatory Visit (INDEPENDENT_AMBULATORY_CARE_PROVIDER_SITE_OTHER): Payer: Commercial Managed Care - HMO | Admitting: *Deleted

## 2016-01-19 DIAGNOSIS — Z23 Encounter for immunization: Secondary | ICD-10-CM

## 2016-01-19 NOTE — Telephone Encounter (Signed)
Pt needs rx for cpap replacements fax number [phone]. Need to include diagnosis code for sleep apnea, base line sleep study, demographics and insurance. Pt brought in a copy of sleep study. In brendale's folder.

## 2016-01-20 ENCOUNTER — Other Ambulatory Visit: Payer: Self-pay | Admitting: Family Medicine

## 2016-01-20 NOTE — Telephone Encounter (Signed)
Please provide a written order for this on prescription with diagnosis I'll be happy to sign it then it can be faxed with the help and input of Brendale

## 2016-01-21 NOTE — Telephone Encounter (Signed)
Order faxed with requested information

## 2016-01-22 DIAGNOSIS — G4733 Obstructive sleep apnea (adult) (pediatric): Secondary | ICD-10-CM | POA: Diagnosis not present

## 2016-01-30 NOTE — Telephone Encounter (Signed)
Rec'd auth for CPAP rental  Auth# [id number], for CPT [phone] - good for 10 "visits" 10/10/15-08/09/16 per [employer]  Faxed approval to [facility]   Sent compliance report and auth to be scanned

## 2016-02-10 DIAGNOSIS — I10 Essential (primary) hypertension: Secondary | ICD-10-CM | POA: Diagnosis not present

## 2016-02-10 DIAGNOSIS — R0683 Snoring: Secondary | ICD-10-CM | POA: Diagnosis not present

## 2016-02-10 DIAGNOSIS — E785 Hyperlipidemia, unspecified: Secondary | ICD-10-CM | POA: Diagnosis not present

## 2016-02-10 DIAGNOSIS — G4733 Obstructive sleep apnea (adult) (pediatric): Secondary | ICD-10-CM | POA: Diagnosis not present

## 2016-03-01 DIAGNOSIS — Z8546 Personal history of malignant neoplasm of prostate: Secondary | ICD-10-CM | POA: Diagnosis not present

## 2016-03-05 ENCOUNTER — Ambulatory Visit (INDEPENDENT_AMBULATORY_CARE_PROVIDER_SITE_OTHER): Payer: Commercial Managed Care - HMO | Admitting: Urology

## 2016-03-05 DIAGNOSIS — Z8546 Personal history of malignant neoplasm of prostate: Secondary | ICD-10-CM

## 2016-03-05 DIAGNOSIS — E291 Testicular hypofunction: Secondary | ICD-10-CM | POA: Diagnosis not present

## 2016-03-05 DIAGNOSIS — N5231 Erectile dysfunction following radical prostatectomy: Secondary | ICD-10-CM | POA: Diagnosis not present

## 2016-03-11 DIAGNOSIS — E785 Hyperlipidemia, unspecified: Secondary | ICD-10-CM | POA: Diagnosis not present

## 2016-03-11 DIAGNOSIS — R0683 Snoring: Secondary | ICD-10-CM | POA: Diagnosis not present

## 2016-03-11 DIAGNOSIS — G4733 Obstructive sleep apnea (adult) (pediatric): Secondary | ICD-10-CM | POA: Diagnosis not present

## 2016-03-11 DIAGNOSIS — I10 Essential (primary) hypertension: Secondary | ICD-10-CM | POA: Diagnosis not present

## 2016-03-30 ENCOUNTER — Ambulatory Visit (INDEPENDENT_AMBULATORY_CARE_PROVIDER_SITE_OTHER): Payer: Commercial Managed Care - HMO | Admitting: Orthopedic Surgery

## 2016-03-30 ENCOUNTER — Ambulatory Visit (INDEPENDENT_AMBULATORY_CARE_PROVIDER_SITE_OTHER): Payer: Commercial Managed Care - HMO

## 2016-03-30 ENCOUNTER — Encounter: Payer: Self-pay | Admitting: Orthopedic Surgery

## 2016-03-30 DIAGNOSIS — M545 Low back pain: Secondary | ICD-10-CM | POA: Diagnosis not present

## 2016-03-30 DIAGNOSIS — Z96641 Presence of right artificial hip joint: Secondary | ICD-10-CM

## 2016-03-30 DIAGNOSIS — M48062 Spinal stenosis, lumbar region with neurogenic claudication: Secondary | ICD-10-CM

## 2016-03-30 NOTE — Progress Notes (Signed)
Patient ID: [name] [name], male   DOB: Jun 07, 1950, 66 y.o.   MRN: [id number]  Chief Complaint  Patient presents with  . Follow-up    HX OF RT THA, DOS 04/20/13    HPI [name] [name] is a 66 y.o. male.  2 YRS Post right total hip arthroplasty hip is doing well  However he does complain of mild discomfort in the left hip as well as bilateral thigh pain, difficulty walking, feeling of tiredness and heaviness in both legs with giving way when walking.  Review of Systems Review of Systems  Constitutional: Negative for activity change, appetite change, chills, fever and unexpected weight change.  Musculoskeletal: Positive for arthralgias and myalgias. Negative for back pain.  Neurological: Negative for weakness and numbness.     Past Medical History:  Diagnosis Date  . Arthritis    right knee and hip   . Cancer [facility])    prostate cancer   . Chronic kidney disease    prostate cancer   . Diabetes mellitus without complication ([place])   . GERD (gastroesophageal reflux disease)   . Gout   . Gout   . Hypercholesteremia   . Hyperglycemia   . Hyperlipidemia   . Hypertension     Past Surgical History:  Procedure Laterality Date  . COLONOSCOPY    . COLONOSCOPY N/A 10/22/2013   Procedure: COLONOSCOPY;  Surgeon: Danie Binder, MD;  [facility];  Service: Endoscopy;  Laterality: N/A;  1115  . HERNIA REPAIR     right inguinal hernia repair   . NASAL SEPTUM SURGERY    . OTHER SURGICAL HISTORY     deviated septum surgery   . ROBOT ASSISTED LAPAROSCOPIC RADICAL PROSTATECTOMY  04/20/2011   Procedure: ROBOTIC ASSISTED LAPAROSCOPIC RADICAL PROSTATECTOMY;  Surgeon: Malka So, MD;  Location: [facility];  Service: Urology;  Laterality: N/A;  Bilateral lymph node dissection  . TOTAL HIP ARTHROPLASTY Right 04/20/2013   Procedure: TOTAL HIP ARTHROPLASTY;  Surgeon: Carole Civil, MD;  Location: [facility];  Service: Orthopedics;  Laterality: Right;    Social History Social History   Substance Use Topics  . Smoking status: Former Smoker    Packs/day: 0.50    Years: 8.00    Types: Cigarettes    Quit date: 03/22/1984  . Smokeless tobacco: Never Used  . Alcohol use Yes     Comment: occasional     Allergies  Allergen Reactions  . Lipitor [Atorvastatin]     Myalgia     Current Meds  Medication Sig  . allopurinol (ZYLOPRIM) 100 MG tablet Take 1 tablet (100 mg total) by mouth daily.  [name] aspirin 81 MG tablet Take 81 mg by mouth daily.  [name] atorvastatin (LIPITOR) 40 MG tablet Take 1 tablet (40 mg total) by mouth daily.  . B Complex-C (SUPER B COMPLEX PO) Take 1 tablet by mouth daily.  . Calcium Carbonate-Vitamin D (CALCIUM-VITAMIN D) 500-200 MG-UNIT per tablet Take 1 tablet by mouth daily.  [name] lisinopril-hydrochlorothiazide (PRINZIDE,ZESTORETIC) 20-25 MG tablet TAKE 1 TABLET EVERY DAY  . Multiple Vitamin (MULTIVITAMIN) tablet Take 1 tablet by mouth daily.   . naproxen sodium (ANAPROX) 220 MG tablet Take 220 mg by mouth at bedtime.  . Omega-3 Fatty Acids (FISH OIL) 1000 MG CAPS Take by mouth.  . ranitidine (ZANTAC) 150 MG capsule Take 150 mg by mouth 2 (two) times daily.  . vitamin E 400 UNIT capsule Take 400 Units by mouth daily.  Physical Exam Physical Exam There were no vitals taken for this visit.  Gen. appearance. The patient is well-developed and well-nourished, grooming and hygiene are normal. There are no gross congenital abnormalities  The patient is alert and oriented to person place and time  Mood and affect are normal  Ambulation Slightly antalgic gait appears have some mild abductor weakness on the right side after total hip  He has no tenderness in his lumbar spine in the midline has some mild tenderness in the left gluteal area normal on the right in terms of palpable tenderness  His leg lengths remain equal.  Examination reveals the following: With the range of motion of  normal in right and left hip with no pain in left hip with hip  flexion mild pain with internal rotation which is approximately 15 in the seated position  Stability tests were normal  both hips and knees  Bilateral knee exam no effusion no tenderness in the medial or lateral joint of either knee  Strength tests revealed grade 5 motor strength in each leg  Skin we find no rash ulceration or erythema both right and left leg  Sensation remains intact right and left lower extremity  Impression vascular system shows no peripheral edema right and left ankle  Data Reviewed AP pelvis and AP lateral right hip show normal right hip implant with no loosening  Moderate arthritis left hip joint  Lumbar spine moderate to severe spondylosis abnormal lateral in terms of excessive lordosis.  Assessment    Encounter Diagnoses  Name Primary?  . History of right hip replacement Yes  . Low back pain, unspecified back pain laterality, unspecified chronicity, with sciatica presence unspecified   . Spinal stenosis, lumbar region, with neurogenic claudication        Plan    He will start physical therapy program for spinal stenosis he will be seen annually for his total hip replacement       [name] 03/30/2016, 10:41 AM

## 2016-03-31 ENCOUNTER — Telehealth: Payer: Self-pay | Admitting: Family Medicine

## 2016-03-31 DIAGNOSIS — E785 Hyperlipidemia, unspecified: Secondary | ICD-10-CM

## 2016-03-31 DIAGNOSIS — M1A9XX0 Chronic gout, unspecified, without tophus (tophi): Secondary | ICD-10-CM

## 2016-03-31 NOTE — Telephone Encounter (Signed)
Lipid, liver, metabolic 7, uric acid level. Please let the patient know his A1c looked very good in the summer I would not recommend repeating that again until 1 year because it showed just minimal prediabetes. If patient is concerned about his sugar add A1c

## 2016-03-31 NOTE — Telephone Encounter (Signed)
Pt is requesting lab orders to be sent over for an upcoming appt. Last labs per epic were: uric and bmp on 10/09/15

## 2016-04-01 NOTE — Telephone Encounter (Signed)
Labs ordered, pt advised. He states he does not wish to repeat A1C at this time.

## 2016-04-01 NOTE — Telephone Encounter (Signed)
I attempted to contact patient. Voicemail not accepting messages at this time.

## 2016-04-02 DIAGNOSIS — E785 Hyperlipidemia, unspecified: Secondary | ICD-10-CM | POA: Diagnosis not present

## 2016-04-02 DIAGNOSIS — M1A9XX0 Chronic gout, unspecified, without tophus (tophi): Secondary | ICD-10-CM | POA: Diagnosis not present

## 2016-04-03 LAB — HEPATIC FUNCTION PANEL
ALBUMIN: 4.1 g/dL (ref 3.6–4.8)
ALK PHOS: 82 IU/L (ref 39–117)
ALT: 18 IU/L (ref 0–44)
AST: 21 IU/L (ref 0–40)
BILIRUBIN TOTAL: 1.2 mg/dL (ref 0.0–1.2)
BILIRUBIN, DIRECT: 0.28 mg/dL (ref 0.00–0.40)
Total Protein: 6.6 g/dL (ref 6.0–8.5)

## 2016-04-03 LAB — BASIC METABOLIC PANEL
BUN / CREAT RATIO: 14 (ref 10–24)
BUN: 18 mg/dL (ref 8–27)
CALCIUM: 8.8 mg/dL (ref 8.6–10.2)
CHLORIDE: 98 mmol/L (ref 96–106)
CO2: 26 mmol/L (ref 18–29)
Creatinine, Ser: 1.3 mg/dL — ABNORMAL HIGH (ref 0.76–1.27)
GFR, EST AFRICAN AMERICAN: 65 mL/min/{1.73_m2} (ref >59)
GFR, EST NON AFRICAN AMERICAN: 56 mL/min/{1.73_m2} — AB (ref >59)
Glucose: 90 mg/dL (ref 65–99)
POTASSIUM: 4.7 mmol/L (ref 3.5–5.2)
Sodium: 138 mmol/L (ref 134–144)

## 2016-04-03 LAB — LIPID PANEL
CHOLESTEROL TOTAL: 159 mg/dL (ref 100–199)
Chol/HDL Ratio: 3.5 ratio units (ref 0.0–5.0)
HDL: 45 mg/dL (ref >39)
LDL Calculated: 91 mg/dL (ref 0–99)
TRIGLYCERIDES: 113 mg/dL (ref 0–149)
VLDL Cholesterol Cal: 23 mg/dL (ref 5–40)

## 2016-04-03 LAB — URIC ACID: Uric Acid: 6.7 mg/dL (ref 3.7–8.6)

## 2016-04-06 ENCOUNTER — Other Ambulatory Visit: Payer: Self-pay | Admitting: Family Medicine

## 2016-04-07 ENCOUNTER — Ambulatory Visit: Payer: Self-pay | Admitting: Family Medicine

## 2016-04-11 DIAGNOSIS — E785 Hyperlipidemia, unspecified: Secondary | ICD-10-CM | POA: Diagnosis not present

## 2016-04-11 DIAGNOSIS — I10 Essential (primary) hypertension: Secondary | ICD-10-CM | POA: Diagnosis not present

## 2016-04-11 DIAGNOSIS — R0683 Snoring: Secondary | ICD-10-CM | POA: Diagnosis not present

## 2016-04-11 DIAGNOSIS — G4733 Obstructive sleep apnea (adult) (pediatric): Secondary | ICD-10-CM | POA: Diagnosis not present

## 2016-04-12 ENCOUNTER — Ambulatory Visit (HOSPITAL_COMMUNITY): Payer: Commercial Managed Care - HMO | Admitting: Physical Therapy

## 2016-04-12 ENCOUNTER — Telehealth (HOSPITAL_COMMUNITY): Payer: Self-pay | Admitting: Family Medicine

## 2016-04-12 NOTE — Telephone Encounter (Signed)
04/12/16 spoke to pt's partner and he was going to give him the message that we needed to rescehdule today's appt.

## 2016-04-14 ENCOUNTER — Encounter (HOSPITAL_COMMUNITY): Payer: Self-pay | Admitting: Physical Therapy

## 2016-04-14 ENCOUNTER — Ambulatory Visit (HOSPITAL_COMMUNITY): Payer: Commercial Managed Care - HMO | Attending: Orthopedic Surgery | Admitting: Physical Therapy

## 2016-04-14 DIAGNOSIS — M79651 Pain in right thigh: Secondary | ICD-10-CM | POA: Diagnosis not present

## 2016-04-14 DIAGNOSIS — M79652 Pain in left thigh: Secondary | ICD-10-CM | POA: Insufficient documentation

## 2016-04-14 DIAGNOSIS — R2689 Other abnormalities of gait and mobility: Secondary | ICD-10-CM | POA: Insufficient documentation

## 2016-04-14 DIAGNOSIS — M6281 Muscle weakness (generalized): Secondary | ICD-10-CM | POA: Diagnosis not present

## 2016-04-14 NOTE — Therapy (Signed)
[place], [place], [address] Phone: [phone]   Fax:  [phone]  Physical Therapy Evaluation  Patient Details  Name: [name] [name] MRN: [id number] Date of Birth: 1950-05-21 Referring Provider: [name], MD  Encounter Date: 04/14/2016      PT End of Session - 04/14/16 1318    Visit Number 1   Number of Visits 13   Date for PT Re-Evaluation 05/05/16   Authorization Type [employer]   Authorization Time Period 04/14/16 to 05/26/16   PT Start Time 0947   PT Stop Time 1030   PT Time Calculation (min) 43 min   Activity Tolerance Patient tolerated treatment well   Behavior During Therapy [facility] for tasks assessed/performed      Past Medical History:  Diagnosis Date  . Arthritis    right knee and hip   . Cancer [facility])    prostate cancer   . Chronic kidney disease    prostate cancer   . Diabetes mellitus without complication ([place])   . GERD (gastroesophageal reflux disease)   . Gout   . Gout   . Hypercholesteremia   . Hyperglycemia   . Hyperlipidemia   . Hypertension     Past Surgical History:  Procedure Laterality Date  . COLONOSCOPY    . COLONOSCOPY N/A 10/22/2013   Procedure: COLONOSCOPY;  Surgeon: Danie Binder, MD;  [facility];  Service: Endoscopy;  Laterality: N/A;  1115  . HERNIA REPAIR     right inguinal hernia repair   . NASAL SEPTUM SURGERY    . OTHER SURGICAL HISTORY     deviated septum surgery   . ROBOT ASSISTED LAPAROSCOPIC RADICAL PROSTATECTOMY  04/20/2011   Procedure: ROBOTIC ASSISTED LAPAROSCOPIC RADICAL PROSTATECTOMY;  Surgeon: Malka So, MD;  Location[facility];  Service: Urology;  Laterality: N/A;  Bilateral lymph node dissection  . TOTAL HIP ARTHROPLASTY Right 04/20/2013   Procedure: TOTAL HIP ARTHROPLASTY;  Surgeon: Carole Civil, MD;  Location: [facility];  Service: Orthopedics;  Laterality: Right;    There were no vitals filed for this visit.        Subjective Assessment - 04/14/16 0954    Subjective Pt reports onset of B knee pain and quad achiness/weakness ~4 months ago after being on his feet all day at work. He would find relief with resting his legs after a couple of hours and not have any issues for the rest of the day. He reports this has progressed since the onset and came out of nowhere. Denies any numbness and tingling.   Pertinent History Rt THA 2015, DM, CKD, HTN, gout, prostate cancer   Limitations Standing;Walking;Other (comment)  going up/down stairs with groceries    How long can you sit comfortably? unlimited    How long can you stand comfortably? unsure   How long can you walk comfortably? unsure    Patient Stated Goals improve activity tolerance   Currently in Pain? No/denies   Pain Location --  anterior thigh    Pain Orientation Right;Left   Pain Descriptors / Indicators --  tiredness   Pain Radiating Towards none    Pain Onset More than a month ago   Pain Frequency Occasional   Aggravating Factors  activity on his feet for any period of time   Pain Relieving Factors resting/napping at home after work             [facility] PT Assessment - 04/14/16 0001  Assessment   Medical Diagnosis Spinal Stenosis   Referring Provider [name], MD   Onset Date/Surgical Date --  several months ago   Next MD Visit Jan 2019   Prior Therapy none      Balance Screen   Has the patient fallen in the past 6 months No   Has the patient had a decrease in activity level because of a fear of falling?  No   Is the patient reluctant to leave their home because of a fear of falling?  No     Home Ec[employer] residence   Additional Comments 5 STE the house     Prior Function   Level of [place] Part time employment   Vocation Requirements 3 hours a day for a couple of days a week. On his feet mostly, not alot of lifting.       Cognition   Overall Cognitive  Status Within Functional Limits for tasks assessed     Observation/Other Assessments   Focus on Therapeutic Outcomes (FOTO)  31% limited      Sensation   Light Touch Appears Intact     Posture/Postural Control   Posture/Postural Control Postural limitations   Postural Limitations Posterior pelvic tilt;Decreased lumbar lordosis   Posture Comments standing     ROM / Strength   AROM / PROM / Strength AROM;Strength     AROM   AROM Assessment Site Lumbar   Lumbar Flexion RFIS x10 reps, no symptom production   Lumbar Extension REIS x10 reps, no symptom production     Strength   Strength Assessment Site Hip;Knee;Ankle   Right/Left Hip Right;Left   Right Hip Flexion 4+/5   Right Hip Extension 4+/5  unable to accurately assess secondary limited extension ROM   Right Hip ABduction 4/5   Left Hip Flexion 4+/5   Left Hip Extension 4+/5  unable to accurately assess secondary limited extension ROM   Left Hip ABduction 4+/5   Right/Left Knee Right;Left   Right Knee Flexion 5/5   Right Knee Extension 5/5   Left Knee Flexion 5/5   Left Knee Extension 5/5     Flexibility   Soft Tissue Assessment /Muscle Length yes   Hamstrings Rt 40, Lt 35 deg lacking    Quadriceps B 75% limited no greater than 90 deg flexion      Palpation   Palpation comment muscle tightness along B distal quads, no pain reproduction      Transfers   Five time sit to stand comments  11.3 sec, no UE     Ambulation/Gait   Ambulation/Gait Yes   Gait Pattern Decreased stance time - right;Decreased step length - left;Decreased arm swing - right;Decreased trunk rotation  decreased hip extension noted BLE, Rt>Lt   Gait Comments treadmill walking at 2.5 mph 0% incline, fatigue reported at 4 min, 2% elevation initiated and symptoms increased at 5:79min, increased to 4% incline and symptoms continued to increase. Pt reports resolution of symptoms ~1-2 min after taking a standing break while therapist discussed evaluation  findings.      6 minute walk test results    Endurance additional comments 3 MWT; 778 ft, symptoms brought on at 2 min; residing immediately upon sitting down                            PT Education - 04/14/16 1317    Education provided Yes  Education Details eval findings/POC; implications for treadmill testing and need for further testing to assess symptom response to acitivty   Person(s) Educated Patient   Methods Explanation   Comprehension Verbalized understanding          PT Short Term Goals - 04/14/16 1342      PT SHORT TERM GOAL #1   Title Pt will demo consistency and independence with his HEP to improve flexibility and activity tolerance.   Time 2   Period Weeks   Status New     PT SHORT TERM GOAL #2   Title Pt will demo proper set up and use of lumbar roll without assisatnce to improve sitting posture and lumbar mobility.   Time 2   Period Weeks   Status New           PT Long Term Goals - 04/14/16 1353      PT LONG TERM GOAL #1   Title Pt will demo negative thomas test on each LE to improve standing posture and hip extensor activation during ambulation.   Time 6   Period Weeks   Status New     PT LONG TERM GOAL #2   Title Pt will demo improved eccentric quad control evident by his ability to descend atleast 4, 6" steps without excessive weight shift Lt and Rt, x3 trials.    Time 6   Period Weeks   Status New     PT LONG TERM GOAL #3   Title Pt will demo improved hip extensor strength to atleast 5/5 MMT to improve his ability to ambulate and ascend/descend steps without signficant difficutly or discomfort.    Time 6   Period Weeks   Status New     PT LONG TERM GOAL #4   Title Pt will perform 5x sit to stand in less than 10 sec without UE support and without crashing into the chair to demonstrate improved functional strength and power.    Time 6   Period Weeks   Status New     PT LONG TERM GOAL #5   Title Pt will demo  improved activity tolerance and endurance evident by his ability to ambulate atleast 5 min consecutively without onset of anterior thigh pain.    Time 6   Period Weeks   Status New               Plan - 04/14/16 1319    Clinical Impression Statement Pt is a 66yo M referred to OPPT with history of B quad fatigue/aching after work and during prolonged activity. He demonstrates decent functional strength and endurance with 5x sit to stand and 3MWT, and near 5/5 strength on BLE muscle testing. He does not demonstrate a directional preference with repeated lumbar extension/preference and his treadmill incline testing resulted in no change in symptoms as well. Will attempt further testing in future sessions to assess symptom response with activity. His primary limitations are trunk weakness and poor hip/knee flexibility likely playing a role in his postural alignment. This is greatly impacting his gait mechanics with poor activation of his posterior chain, resulting in overuse of the quads. Pt would benefit from skilled PT to address his limitations and improve his activity tolerance and ability to perform daily work demands. Eval findings were discussed with the pt who verbalized understanding and agreement with proposed PT POC and frequency.   Rehab Potential Good   PT Frequency 2x / week   PT Duration 6 weeks   PT  Treatment/Interventions ADLs/Self Care Home Management;Gait training;Stair training;Functional mobility training;Therapeutic activities;Therapeutic exercise;Patient/family education;Neuromuscular re-education;Manual techniques;Passive range of motion   PT Next Visit Plan trial nustep for symptom reproduction; hip flexibility HEP: hamstring/quad/hip flexor; lumbar extension (roll, prone on elbows)   PT Home Exercise Plan next visit    Recommended Other Services none    Consulted and Agree with Plan of Care Patient      Patient will benefit from skilled therapeutic intervention in  order to improve the following deficits and impairments:  Abnormal gait, Decreased activity tolerance, Decreased endurance, Decreased range of motion, Decreased strength, Impaired flexibility, Postural dysfunction, Pain, Difficulty walking, Increased muscle spasms  Visit Diagnosis: Other abnormalities of gait and mobility  Muscle weakness (generalized)  Pain in right thigh  Pain in left thigh      G-Codes - 05/01/16 1420    Functional Assessment Tool Used FOTO: 31% limited   Functional Limitation Mobility: Walking and moving around   Mobility: Walking and Moving Around Current Status [phone]) At least 20 percent but less than 40 percent impaired, limited or restricted   Mobility: Walking and Moving Around Goal Status [phone]) At least 1 percent but less than 20 percent impaired, limited or restricted       Problem List Patient Active Problem List   Diagnosis Date Noted  . HTN (hypertension) 12/10/2013  . Status post THR (total hip replacement) 05/01/2013  . Arthritis of right hip 04/20/2013  . Osteoarthritis of right hip 03/29/2013  . Prediabetes 03/26/2013  . Hyperlipidemia 03/26/2013  . Microproteinuria 03/26/2013  . Night sweats 03/26/2013  . Gout 03/26/2013  . Office hypertension 03/09/2013  . Osteoarthritis of right knee 11/23/2012  . Osteoarthritis of hip 09/05/2012  . Back pain 09/05/2012  . Prostate cancer ([place]) 04/20/2011   3:26 PM,2016/05/01 Elly Modena PT, DPT [facility] [place] [address] [place], [place], [address] Phone: [phone]   Fax:  [phone]  Name: [name] [name] MRN: [id number] Date of Birth: 1950/05/26

## 2016-04-16 ENCOUNTER — Telehealth (HOSPITAL_COMMUNITY): Payer: Self-pay | Admitting: Physical Therapy

## 2016-04-16 ENCOUNTER — Ambulatory Visit (HOSPITAL_COMMUNITY): Payer: Commercial Managed Care - HMO | Admitting: Physical Therapy

## 2016-04-16 NOTE — Telephone Encounter (Signed)
No Show. Pt reminded of missed appt and states he didn't notice it on his sheet. He apologized and confirmed his next appt on 04/21/16 at 10:30am.  5:22 PM,04/16/16 Elly Modena PT, DPT [facility] [phone]

## 2016-04-20 ENCOUNTER — Ambulatory Visit: Payer: Self-pay | Admitting: Family Medicine

## 2016-04-21 ENCOUNTER — Ambulatory Visit (HOSPITAL_COMMUNITY): Payer: Commercial Managed Care - HMO

## 2016-04-21 DIAGNOSIS — M79652 Pain in left thigh: Secondary | ICD-10-CM

## 2016-04-21 DIAGNOSIS — M6281 Muscle weakness (generalized): Secondary | ICD-10-CM | POA: Diagnosis not present

## 2016-04-21 DIAGNOSIS — R2689 Other abnormalities of gait and mobility: Secondary | ICD-10-CM

## 2016-04-21 DIAGNOSIS — M79651 Pain in right thigh: Secondary | ICD-10-CM | POA: Diagnosis not present

## 2016-04-21 NOTE — Patient Instructions (Addendum)
Hamstring Step 3    Left leg in maximal straight leg raise, heel at maximal stretch, straighten knee further by tightening knee cap. Warning: Intense stretch. Stay within tolerance. Hold 30 seconds. Relax knee cap only. Repeat 3 times.  Copyright  VHI. All rights reserved.   Hip Flexor Stretch    Lying on back near edge of bed, bend one leg, foot flat. Hang other leg over edge, relaxed, thigh resting entirely on bed for 1 minutes. Repeat 3 times. Do 2 sessions per day. Advanced Exercise: Bend knee back keeping thigh in contact with bed.  http://gt2.exer.us/346   Copyright  VHI. All rights reserved.   Knee / Archie Balboa on stomach, knees together. Grab one ankle with same side hand. Use towel/belt if needed to reach. Gently pull foot toward buttock. Hold 30 seconds. Repeat with other leg. Repeat 3 times. Do 2 sessions per day.  Copyright  VHI. All rights reserved.   On Elbows (Prone)    Rise up on elbows as high as possible, keeping hips on floor. Hold 2 minutes Do 2 sessions per day.  http://orth.exer.us/92   Copyright  VHI. All rights reserved.

## 2016-04-21 NOTE — Therapy (Signed)
[place], [place], [address] Phone: [phone]   Fax:  [phone]  Physical Therapy Treatment  Patient Details  Name: [name] [name] MRN: [id number] Date of Birth: [birth year] Referring Provider: [name], MD  Encounter Date: 04/21/2016      PT End of Session - 04/21/16 1045    Visit Number 2   Number of Visits 13   Date for PT Re-Evaluation 05/05/16   Authorization Type [employer]   Authorization Time Period 04/14/16 to 05/26/16   PT Start Time 1035   PT Stop Time 1116   PT Time Calculation (min) 41 min   Activity Tolerance Patient tolerated treatment well   Behavior During Therapy [facility] for tasks assessed/performed      Past Medical History:  Diagnosis Date  . Arthritis    right knee and hip   . Cancer [facility])    prostate cancer   . Chronic kidney disease    prostate cancer   . Diabetes mellitus without complication ([place])   . GERD (gastroesophageal reflux disease)   . Gout   . Gout   . Hypercholesteremia   . Hyperglycemia   . Hyperlipidemia   . Hypertension     Past Surgical History:  Procedure Laterality Date  . COLONOSCOPY    . COLONOSCOPY N/A 10/22/2013   Procedure: COLONOSCOPY;  Surgeon: Danie Binder, MD;  [facility];  Service: Endoscopy;  Laterality: N/A;  1115  . HERNIA REPAIR     right inguinal hernia repair   . NASAL SEPTUM SURGERY    . OTHER SURGICAL HISTORY     deviated septum surgery   . ROBOT ASSISTED LAPAROSCOPIC RADICAL PROSTATECTOMY  04/20/2011   Procedure: ROBOTIC ASSISTED LAPAROSCOPIC RADICAL PROSTATECTOMY;  Surgeon: Malka So, MD;  [facility]: [facility];  Service: Urology;  Laterality: N/A;  Bilateral lymph node dissection  . TOTAL HIP ARTHROPLASTY Right 04/20/2013   Procedure: TOTAL HIP ARTHROPLASTY;  Surgeon: Carole Civil, MD;  Location: [facility];  Service: Orthopedics;  Laterality: Right;    There were no vitals filed for this visit.       Subjective Assessment - 04/21/16 1037    Subjective Pt stated he is currently pain free.  Reports he usually has increased pain anterior knee following work.  Missed last apt so no HEP given yet.     Pertinent History Rt THA 2015, DM, CKD, HTN, gout, prostate cancer   Patient Stated Goals improve activity tolerance   Currently in Pain? No/denies                         [facility] Adult PT Treatment/Exercise - 04/21/16 0001      Exercises   Exercises Lumbar     Lumbar Exercises: Stretches   Active Hamstring Stretch 3 reps;30 seconds   Active Hamstring Stretch Limitations supine with rope   Lower Trunk Rotation 10 seconds   Lower Trunk Rotation Limitations 10x each direction   Hip Flexor Stretch 3 reps;30 seconds   Hip Flexor Stretch Limitations thomas stretch in supine   Prone on Elbows Stretch 1 rep   Prone on Elbows Stretch Limitations 2 min   Quad Stretch 3 reps;30 seconds   Quad Stretch Limitations hip flexor (thomas stretch) supine as well as prone quad st with rope     Lumbar Exercises: Aerobic   Stationary Bike Nustep x 48min to assess symptom reproduction findings include:  increased  pain Bil quad 4-5/10 following 4 minutes                PT Education - 04/21/16 1059    Education provided Yes   Education Details Reviewed goals, established HEP for LE flexibilty, copy of eval given to pt.  Trial on Nustep with symptoms reporoduced followiong 4 minutes   Person(s) Educated Patient   Methods Explanation;Demonstration;Handout   Comprehension Verbalized understanding;Returned demonstration;Need further instruction          PT Short Term Goals - 04/14/16 1342      PT SHORT TERM GOAL #1   Title Pt will demo consistency and independence with his HEP to improve flexibility and activity tolerance.   Time 2   Period Weeks   Status New     PT SHORT TERM GOAL #2   Title Pt will demo proper set up and use of lumbar roll without assisatnce to improve  sitting posture and lumbar mobility.   Time 2   Period Weeks   Status New           PT Long Term Goals - 04/14/16 1353      PT LONG TERM GOAL #1   Title Pt will demo negative thomas test on each LE to improve standing posture and hip extensor activation during ambulation.   Time 6   Period Weeks   Status New     PT LONG TERM GOAL #2   Title Pt will demo improved eccentric quad control evident by his ability to descend atleast 4, 6" steps without excessive weight shift Lt and Rt, x3 trials.    Time 6   Period Weeks   Status New     PT LONG TERM GOAL #3   Title Pt will demo improved hip extensor strength to atleast 5/5 MMT to improve his ability to ambulate and ascend/descend steps without signficant difficutly or discomfort.    Time 6   Period Weeks   Status New     PT LONG TERM GOAL #4   Title Pt will perform 5x sit to stand in less than 10 sec without UE support and without crashing into the chair to demonstrate improved functional strength and power.    Time 6   Period Weeks   Status New     PT LONG TERM GOAL #5   Title Pt will demo improved activity tolerance and endurance evident by his ability to ambulate atleast 5 min consecutively without onset of anterior thigh pain.    Time 6   Period Weeks   Status New               Plan - 04/21/16 1103    Clinical Impression Statement Reviewed goals, copy of eval given to pt and established HEP to address hip flexibilty.  Trial on Nustep did reproduce symptoms of increased pain Bil quad achey pain increased to 4-5/10 following 4 minutes.  Session focus on improving hip flexibilty with functional stretches and education on importance of posture.  EOS pt reports no lower back pain and improved LE flexibility, did state some achey in Bil thigh though was reduced followiong stretches.       Rehab Potential Good   PT Frequency 2x / week   PT Duration 6 weeks   PT Treatment/Interventions ADLs/Self Care Home Management;Gait  training;Stair training;Functional mobility training;Therapeutic activities;Therapeutic exercise;Patient/family education;Neuromuscular re-education;Manual techniques;Passive range of motion   PT Next Visit Plan Continue with PT POC to improve hip flexibility; assess compliance  with HEP: hamstring/quad/hip flexor; lumbar extension (roll, prone on elbows)   PT Home Exercise Plan 04/21/2016: hamstrings, quad, and hip flexor stretches; POE      Patient will benefit from skilled therapeutic intervention in order to improve the following deficits and impairments:  Abnormal gait, Decreased activity tolerance, Decreased endurance, Decreased range of motion, Decreased strength, Impaired flexibility, Postural dysfunction, Pain, Difficulty walking, Increased muscle spasms  Visit Diagnosis: Other abnormalities of gait and mobility  Muscle weakness (generalized)  Pain in right thigh  Pain in left thigh     Problem List Patient Active Problem List   Diagnosis Date Noted  . HTN (hypertension) 12/10/2013  . Status post THR (total hip replacement) 05/01/2013  . Arthritis of right hip 04/20/2013  . Osteoarthritis of right hip 03/29/2013  . Prediabetes 03/26/2013  . Hyperlipidemia 03/26/2013  . Microproteinuria 03/26/2013  . Night sweats 03/26/2013  . Gout 03/26/2013  . Office hypertension 03/09/2013  . Osteoarthritis of right knee 11/23/2012  . Osteoarthritis of hip 09/05/2012  . Back pain 09/05/2012  . Prostate cancer [facility]) 04/20/2011   Ihor Austin, [place]; Accident  Aldona Lento 04/21/2016, 12:53 PM  [place], [place], [address] Phone: [phone]   Fax:  [phone]  Name: [name] [name] MRN: [id number] Date of Birth: 1950/09/14

## 2016-04-23 ENCOUNTER — Ambulatory Visit (HOSPITAL_COMMUNITY): Payer: Commercial Managed Care - HMO | Attending: Orthopedic Surgery | Admitting: Physical Therapy

## 2016-04-23 DIAGNOSIS — R2689 Other abnormalities of gait and mobility: Secondary | ICD-10-CM

## 2016-04-23 DIAGNOSIS — M6281 Muscle weakness (generalized): Secondary | ICD-10-CM | POA: Insufficient documentation

## 2016-04-23 DIAGNOSIS — M79652 Pain in left thigh: Secondary | ICD-10-CM | POA: Insufficient documentation

## 2016-04-23 DIAGNOSIS — M79651 Pain in right thigh: Secondary | ICD-10-CM | POA: Insufficient documentation

## 2016-04-23 NOTE — Therapy (Signed)
[place], [place], [address] Phone: [phone]   Fax:  [phone]  Physical Therapy Treatment  Patient Details  Name: [name] [name] MRN: [id number] Date of Birth: [birth year] Referring Provider: [name], MD  Encounter Date: 04/23/2016      PT End of Session - 04/23/16 1317    Visit Number 3   Number of Visits 13   Date for PT Re-Evaluation 05/05/16   Authorization Type [employer]   Authorization Time Period 04/14/16 to 05/26/16   PT Start Time 0947   PT Stop Time 1028   PT Time Calculation (min) 41 min   Activity Tolerance Patient tolerated treatment well;No increased pain   Behavior During Therapy [facility] for tasks assessed/performed      Past Medical History:  Diagnosis Date  . Arthritis    right knee and hip   . Cancer [facility])    prostate cancer   . Chronic kidney disease    prostate cancer   . Diabetes mellitus without complication ([place])   . GERD (gastroesophageal reflux disease)   . Gout   . Gout   . Hypercholesteremia   . Hyperglycemia   . Hyperlipidemia   . Hypertension     Past Surgical History:  Procedure Laterality Date  . COLONOSCOPY    . COLONOSCOPY N/A 10/22/2013   Procedure: COLONOSCOPY;  Surgeon: Danie Binder, MD;  [facility];  Service: Endoscopy;  Laterality: N/A;  1115  . HERNIA REPAIR     right inguinal hernia repair   . NASAL SEPTUM SURGERY    . OTHER SURGICAL HISTORY     deviated septum surgery   . ROBOT ASSISTED LAPAROSCOPIC RADICAL PROSTATECTOMY  04/20/2011   Procedure: ROBOTIC ASSISTED LAPAROSCOPIC RADICAL PROSTATECTOMY;  Surgeon: Malka So, MD;  [facility];  Service: Urology;  Laterality: N/A;  Bilateral lymph node dissection  . TOTAL HIP ARTHROPLASTY Right 04/20/2013   Procedure: TOTAL HIP ARTHROPLASTY;  Surgeon: Carole Civil, MD;  Location: [facility];  Service: Orthopedics;  Laterality: Right;    There were no vitals filed for this  visit.      Subjective Assessment - 04/23/16 0950    Subjective Pt reports that he is performing his stretching and everything at home. He reports minimal change in anterior thigh pain since his evaluation, however he does feel that his stretches are improving his flexibility in his legs.     Pertinent History Rt THA 2015, DM, CKD, HTN, gout, prostate cancer   Patient Stated Goals improve activity tolerance   Currently in Pain? No/denies                         [facility] Adult PT Treatment/Exercise - 04/23/16 0001      Exercises   Exercises Other Exercises   Other Exercises  alternating standing squat with 1 lap around the gym, symptoms (ant thigh pain) brought on after 2 trials, remained the same through the remainder of the trials and improved in between activities with standing still.      Lumbar Exercises: Stretches   Passive Hamstring Stretch 1 rep;30 seconds   Passive Hamstring Stretch Limitations with strap, 1 sec standing on 12" step each LE   Quad Stretch 3 reps;30 seconds   Quad Stretch Limitations prone with strap      Lumbar Exercises: Supine   Bent Knee Raise 10 reps   Bent Knee Raise Limitations with  ab set and knee straightened out    Other Supine Lumbar Exercises ab set with bent knee 90deg alt lowering 2x10 reps   verbal cues needed for technique                PT Education - 04/23/16 1021    Education provided Yes   Education Details importance of trunk strength during daily activity; implications for walking/squatting activity to improve tolerance to being on his feet           PT Short Term Goals - 04/14/16 1342      PT SHORT TERM GOAL #1   Title Pt will demo consistency and independence with his HEP to improve flexibility and activity tolerance.   Time 2   Period Weeks   Status New     PT SHORT TERM GOAL #2   Title Pt will demo proper set up and use of lumbar roll without assisatnce to improve sitting posture and lumbar  mobility.   Time 2   Period Weeks   Status New           PT Long Term Goals - 04/14/16 1353      PT LONG TERM GOAL #1   Title Pt will demo negative thomas test on each LE to improve standing posture and hip extensor activation during ambulation.   Time 6   Period Weeks   Status New     PT LONG TERM GOAL #2   Title Pt will demo improved eccentric quad control evident by his ability to descend atleast 4, 6" steps without excessive weight shift Lt and Rt, x3 trials.    Time 6   Period Weeks   Status New     PT LONG TERM GOAL #3   Title Pt will demo improved hip extensor strength to atleast 5/5 MMT to improve his ability to ambulate and ascend/descend steps without signficant difficutly or discomfort.    Time 6   Period Weeks   Status New     PT LONG TERM GOAL #4   Title Pt will perform 5x sit to stand in less than 10 sec without UE support and without crashing into the chair to demonstrate improved functional strength and power.    Time 6   Period Weeks   Status New     PT LONG TERM GOAL #5   Title Pt will demo improved activity tolerance and endurance evident by his ability to ambulate atleast 5 min consecutively without onset of anterior thigh pain.    Time 6   Period Weeks   Status New               Plan - 04/23/16 1413    Clinical Impression Statement Continued this visit ensuring proper technique with HEP provided at his last session. Pt has been consistently performing his exercises, however some adjustments were made to improve technique and stretch with several of the exercises. Pt demonstrated good understanding of these adjustments. Introduced more trunk strengthening activity this visit, with the pt requiring occasional cues for proper technique secondary to weakness. Ended session without pain report.    Rehab Potential Good   PT Frequency 2x / week   PT Duration 6 weeks   PT Treatment/Interventions ADLs/Self Care Home Management;Gait training;Stair  training;Functional mobility training;Therapeutic activities;Therapeutic exercise;Patient/family education;Neuromuscular re-education;Manual techniques;Passive range of motion   PT Next Visit Plan trunk strengthening in supine: dead bug, etc as able to perform with proper breathing; attempt ABI if with PT  PT Home Exercise Plan 04/21/2016: hamstrings, quad, and hip flexor stretches   Consulted and Agree with Plan of Care Patient      Patient will benefit from skilled therapeutic intervention in order to improve the following deficits and impairments:  Abnormal gait, Decreased activity tolerance, Decreased endurance, Decreased range of motion, Decreased strength, Impaired flexibility, Postural dysfunction, Pain, Difficulty walking, Increased muscle spasms  Visit Diagnosis: Other abnormalities of gait and mobility  Muscle weakness (generalized)  Pain in right thigh  Pain in left thigh     Problem List Patient Active Problem List   Diagnosis Date Noted  . HTN (hypertension) 12/10/2013  . Status post THR (total hip replacement) 05/01/2013  . Arthritis of right hip 04/20/2013  . Osteoarthritis of right hip 03/29/2013  . Prediabetes 03/26/2013  . Hyperlipidemia 03/26/2013  . Microproteinuria 03/26/2013  . Night sweats 03/26/2013  . Gout 03/26/2013  . Office hypertension 03/09/2013  . Osteoarthritis of right knee 11/23/2012  . Osteoarthritis of hip 09/05/2012  . Back pain 09/05/2012  . Prostate cancer ([place]) 04/20/2011   2:26 PM,04/23/16 Elly Modena PT, DPT [facility] [place] [address] [place], [place], [address] Phone: [phone]   Fax:  [phone]  Name: [name] [name] MRN: [id number] Date of Birth: 25-Nov-1950

## 2016-04-26 ENCOUNTER — Other Ambulatory Visit: Payer: Self-pay | Admitting: Family Medicine

## 2016-04-28 ENCOUNTER — Ambulatory Visit (HOSPITAL_COMMUNITY): Payer: Commercial Managed Care - HMO | Admitting: Physical Therapy

## 2016-04-29 ENCOUNTER — Ambulatory Visit (HOSPITAL_COMMUNITY): Payer: Commercial Managed Care - HMO | Admitting: Physical Therapy

## 2016-04-29 DIAGNOSIS — M79651 Pain in right thigh: Secondary | ICD-10-CM

## 2016-04-29 DIAGNOSIS — R2689 Other abnormalities of gait and mobility: Secondary | ICD-10-CM | POA: Diagnosis not present

## 2016-04-29 DIAGNOSIS — M6281 Muscle weakness (generalized): Secondary | ICD-10-CM

## 2016-04-29 DIAGNOSIS — M79652 Pain in left thigh: Secondary | ICD-10-CM | POA: Diagnosis not present

## 2016-04-29 NOTE — Therapy (Signed)
[place], [place], [address] Phone: [phone]   Fax:  [phone]  Physical Therapy Treatment  Patient Details  Name: [name] [name] MRN: [id number] Date of Birth: December 27, 1950 Referring Provider: [name], MD  Encounter Date: 04/29/2016      PT End of Session - 04/29/16 1032    Visit Number 4   Number of Visits 13   Date for PT Re-Evaluation 05/05/16   Authorization Type [employer]   Authorization Time Period 04/14/16 to 05/26/16   PT Start Time 0945   PT Stop Time 1025   PT Time Calculation (min) 40 min   Activity Tolerance Patient tolerated treatment well;No increased pain   Behavior During Therapy WFL for tasks assessed/performed      Past Medical History:  Diagnosis Date  . Arthritis    right knee and hip   . Cancer [facility])    prostate cancer   . Chronic kidney disease    prostate cancer   . Diabetes mellitus without complication ([place])   . GERD (gastroesophageal reflux disease)   . Gout   . Gout   . Hypercholesteremia   . Hyperglycemia   . Hyperlipidemia   . Hypertension     Past Surgical History:  Procedure Laterality Date  . COLONOSCOPY    . COLONOSCOPY N/A 10/22/2013   Procedure: COLONOSCOPY;  Surgeon: Danie Binder, MD;  [facility];  Service: Endoscopy;  Laterality: N/A;  1115  . HERNIA REPAIR     right inguinal hernia repair   . NASAL SEPTUM SURGERY    . OTHER SURGICAL HISTORY     deviated septum surgery   . ROBOT ASSISTED LAPAROSCOPIC RADICAL PROSTATECTOMY  04/20/2011   Procedure: ROBOTIC ASSISTED LAPAROSCOPIC RADICAL PROSTATECTOMY;  Surgeon: Malka So, MD;  [facility];  Service: Urology;  Laterality: N/A;  Bilateral lymph node dissection  . TOTAL HIP ARTHROPLASTY Right 04/20/2013   Procedure: TOTAL HIP ARTHROPLASTY;  Surgeon: Carole Civil, MD;  Location: [facility];  Service: Orthopedics;  Laterality: Right;    There were no vitals filed for this  visit.      Subjective Assessment - 04/29/16 0947    Subjective Pt reports that he has noticed his pain is much better, stating he was pleasantly surprised. He continues to perform his stretches at home.    Pertinent History Rt THA 2015, DM, CKD, HTN, gout, prostate cancer   Patient Stated Goals improve activity tolerance   Currently in Pain? No/denies                         [facility] Adult PT Treatment/Exercise - 04/29/16 0001      Exercises   Other Exercises  seated trunk rotation Lt/Rt with green TB initially and increased to blue TB for second set      Lumbar Exercises: Stretches   Hip Flexor Stretch 2 reps;30 seconds   Hip Flexor Stretch Limitations standing      Lumbar Exercises: Standing   Other Standing Lumbar Exercises standing wall march with same UE/LE 2x15 each    Other Standing Lumbar Exercises forward step up with BUE support and contralateral knee drive [id number] reps each  Standing hamstring curls x15 reps each, no weight      Lumbar Exercises: Seated   Long Arc Quad on Chair Both;15 reps;1 set   LAQ on Chair Weights (lbs) 10   LAQ on Chair Limitations arms  crossed, sitting on dyna disc      Lumbar Exercises: Supine   Bent Knee Raise 10 reps   Bent Knee Raise Limitations ab set with BLE raise/lower from 6" box.    Other Supine Lumbar Exercises ab set 90/90 and decreased      Lumbar Exercises: Prone   Opposite Arm/Leg Raise 10 reps;Right arm/Left leg;Left arm/Right leg                PT Education - 04/29/16 1031    Education provided Yes   Education Details discussed updates with HEP and implications of several activities performed during his session.    Person(s) Educated Patient   Methods Explanation;Handout;Demonstration;Verbal cues   Comprehension Verbalized understanding;Returned demonstration          PT Short Term Goals - 04/14/16 1342      PT SHORT TERM GOAL #1   Title Pt will demo consistency and independence with his HEP  to improve flexibility and activity tolerance.   Time 2   Period Weeks   Status New     PT SHORT TERM GOAL #2   Title Pt will demo proper set up and use of lumbar roll without assisatnce to improve sitting posture and lumbar mobility.   Time 2   Period Weeks   Status New           PT Long Term Goals - 04/14/16 1353      PT LONG TERM GOAL #1   Title Pt will demo negative thomas test on each LE to improve standing posture and hip extensor activation during ambulation.   Time 6   Period Weeks   Status New     PT LONG TERM GOAL #2   Title Pt will demo improved eccentric quad control evident by his ability to descend atleast 4, 6" steps without excessive weight shift Lt and Rt, x3 trials.    Time 6   Period Weeks   Status New     PT LONG TERM GOAL #3   Title Pt will demo improved hip extensor strength to atleast 5/5 MMT to improve his ability to ambulate and ascend/descend steps without signficant difficutly or discomfort.    Time 6   Period Weeks   Status New     PT LONG TERM GOAL #4   Title Pt will perform 5x sit to stand in less than 10 sec without UE support and without crashing into the chair to demonstrate improved functional strength and power.    Time 6   Period Weeks   Status New     PT LONG TERM GOAL #5   Title Pt will demo improved activity tolerance and endurance evident by his ability to ambulate atleast 5 min consecutively without onset of anterior thigh pain.    Time 6   Period Weeks   Status New               Plan - 04/29/16 1002    Clinical Impression Statement Pt arrived reporting overall improvements in B quadriceps pain following his last session. Continued this visit with therex to address LE strength and endurance as well as hip flexor flexibility. Pt able to return proper demonstration of new activities and these were added to his HEP. Ended session without report of or increase in pain. Will continue with current POC.   Rehab Potential  Good   PT Frequency 2x / week   PT Duration 6 weeks   PT Treatment/Interventions ADLs/Self Care Home  Management;Gait training;Stair training;Functional mobility training;Therapeutic activities;Therapeutic exercise;Patient/family education;Neuromuscular re-education;Manual techniques;Passive range of motion   PT Next Visit Plan trunk strengthening in supine: progression to dead bug, etc as able to perform with proper breathing; standing functional strengthening to improve quad endurance and hip flexibility.     PT Home Exercise Plan hamstrings, quad, and hip flexor stretches, standing wall march    Consulted and Agree with Plan of Care Patient      Patient will benefit from skilled therapeutic intervention in order to improve the following deficits and impairments:  Abnormal gait, Decreased activity tolerance, Decreased endurance, Decreased range of motion, Decreased strength, Impaired flexibility, Postural dysfunction, Pain, Difficulty walking, Increased muscle spasms  Visit Diagnosis: Other abnormalities of gait and mobility  Muscle weakness (generalized)  Pain in right thigh  Pain in left thigh     Problem List Patient Active Problem List   Diagnosis Date Noted  . HTN (hypertension) 12/10/2013  . Status post THR (total hip replacement) 05/01/2013  . Arthritis of right hip 04/20/2013  . Osteoarthritis of right hip 03/29/2013  . Prediabetes 03/26/2013  . Hyperlipidemia 03/26/2013  . Microproteinuria 03/26/2013  . Night sweats 03/26/2013  . Gout 03/26/2013  . Office hypertension 03/09/2013  . Osteoarthritis of right knee 11/23/2012  . Osteoarthritis of hip 09/05/2012  . Back pain 09/05/2012  . Prostate cancer ([place]) 04/20/2011    10:52 AM,04/29/16 Elly Modena PT, DPT [facility] [place] [address] [place], [place], [address] Phone: [phone]   Fax:  [phone]  Name:  DEONTAY DEIGNAN MRN: [id number] Date of Birth: 1950-08-10

## 2016-04-30 ENCOUNTER — Ambulatory Visit (HOSPITAL_COMMUNITY): Payer: Commercial Managed Care - HMO | Admitting: Physical Therapy

## 2016-04-30 ENCOUNTER — Ambulatory Visit (INDEPENDENT_AMBULATORY_CARE_PROVIDER_SITE_OTHER): Payer: Medicare HMO | Admitting: Family Medicine

## 2016-04-30 ENCOUNTER — Encounter: Payer: Self-pay | Admitting: Family Medicine

## 2016-04-30 VITALS — BP 124/80 | Ht 66.5 in | Wt 217.2 lb

## 2016-04-30 DIAGNOSIS — M1A9XX0 Chronic gout, unspecified, without tophus (tophi): Secondary | ICD-10-CM

## 2016-04-30 DIAGNOSIS — R7303 Prediabetes: Secondary | ICD-10-CM

## 2016-04-30 DIAGNOSIS — I10 Essential (primary) hypertension: Secondary | ICD-10-CM

## 2016-04-30 DIAGNOSIS — E785 Hyperlipidemia, unspecified: Secondary | ICD-10-CM

## 2016-04-30 MED ORDER — ATORVASTATIN CALCIUM 40 MG PO TABS: 40.0000 mg | ORAL_TABLET | Freq: Every day | ORAL | 2 refills | Status: DC

## 2016-04-30 MED ORDER — LISINOPRIL-HYDROCHLOROTHIAZIDE 20-25 MG PO TABS: 1.0000 | ORAL_TABLET | Freq: Every day | ORAL | 1 refills | Status: DC

## 2016-04-30 NOTE — Progress Notes (Signed)
   Subjective:    Patient ID: [name] [name], male    DOB: 07-12-1950, 66 y.o.   MRN: [id number]  Hypertension  This is a chronic problem. The current episode started more than 1 year ago. The problem has been gradually improving since onset. Pertinent negatives include no chest pain, headaches or shortness of breath. There are no associated agents to hypertension. There are no known risk factors for coronary artery disease. Treatments tried: lisinopril-hctz. The current treatment provides moderate improvement. There are no compliance problems.    Patient is participating in therapy for his knee pain.  Patient has some mucous in his throat and coughing along with eating. He thinks it may be related to his GERD.   Patient denies any true dysphagia. Denies any flareup of gout Patient states he takes his gout medicine regularly Tries watch diet States physically active Does have problems with reported spinal stenosis but physical therapy helping Patient also takes blood pressure medicine regular basis states he tries avoid excessive salt Takes his cholesterol medicine on a regular basis denies any problems with it.   Review of Systems  Constitutional: Negative for activity change, fatigue and fever.  Respiratory: Negative for cough and shortness of breath.   Cardiovascular: Negative for chest pain and leg swelling.  Neurological: Negative for headaches.       Objective:   Physical Exam  Constitutional: He appears well-nourished. No distress.  Cardiovascular: Normal rate, regular rhythm and normal heart sounds.   No murmur heard. Pulmonary/Chest: Effort normal and breath sounds normal. No respiratory distress.  Musculoskeletal: He exhibits no edema.  Lymphadenopathy:    He has no cervical adenopathy.  Neurological: He is alert.  Psychiatric: His behavior is normal.  Vitals reviewed.         Assessment & Plan:  Sleep apnea-Using CPAP seems to help some denies daytime  sleepiness  GERD-states no burning in the esophagus but sometimes relates to slight difficulty swallowing certain foods although this is not consistent. No true dysphagia does not cause him to choke. He states that he will notify us if it gets worse we'll set him up with gastroenterology to have EGD and stretching  hyperlip-lab work looks good continue current measures watch diet  DM-fasting sugar looks good continue physical activity eating healthy. No need for any other intervention currently check A1c on next visit  Gout-under good control uric acid looks good  Prostate cancer -being followed by specialist  Renal insufficiency we will watch closely if it worsens we will need further intervention  Follow-up 6 months for wellness a medication check

## 2016-05-04 ENCOUNTER — Ambulatory Visit (HOSPITAL_COMMUNITY): Payer: Commercial Managed Care - HMO

## 2016-05-04 DIAGNOSIS — M6281 Muscle weakness (generalized): Secondary | ICD-10-CM | POA: Diagnosis not present

## 2016-05-04 DIAGNOSIS — M79651 Pain in right thigh: Secondary | ICD-10-CM

## 2016-05-04 DIAGNOSIS — M79652 Pain in left thigh: Secondary | ICD-10-CM | POA: Diagnosis not present

## 2016-05-04 DIAGNOSIS — R2689 Other abnormalities of gait and mobility: Secondary | ICD-10-CM | POA: Diagnosis not present

## 2016-05-04 NOTE — Therapy (Signed)
[place], [place], [address] Phone: [phone]   Fax:  [phone]  Physical Therapy Treatment  Patient Details  Name: [name] [name] MRN: [id number] Date of Birth: 1951/01/06 Referring Provider: [name], MD  Encounter Date: 05/04/2016      PT End of Session - 05/04/16 1007    Visit Number 5   Number of Visits 13   Date for PT Re-Evaluation 05/05/16   Authorization Type [employer]   Authorization Time Period 04/14/16 to 05/26/16   PT Start Time 0953   PT Stop Time 1031   PT Time Calculation (min) 38 min   Activity Tolerance Patient tolerated treatment well;No increased pain   Behavior During Therapy WFL for tasks assessed/performed      Past Medical History:  Diagnosis Date  . Arthritis    right knee and hip   . Cancer [facility])    prostate cancer   . Chronic kidney disease    prostate cancer   . Diabetes mellitus without complication ([place])   . GERD (gastroesophageal reflux disease)   . Gout   . Gout   . Hypercholesteremia   . Hyperglycemia   . Hyperlipidemia   . Hypertension     Past Surgical History:  Procedure Laterality Date  . COLONOSCOPY    . COLONOSCOPY N/A 10/22/2013   Procedure: COLONOSCOPY;  Surgeon: Danie Binder, MD;  [facility];  Service: Endoscopy;  Laterality: N/A;  1115  . HERNIA REPAIR     right inguinal hernia repair   . NASAL SEPTUM SURGERY    . OTHER SURGICAL HISTORY     deviated septum surgery   . ROBOT ASSISTED LAPAROSCOPIC RADICAL PROSTATECTOMY  04/20/2011   Procedure: ROBOTIC ASSISTED LAPAROSCOPIC RADICAL PROSTATECTOMY;  Surgeon: Malka So, MD;  Location: [facility];  Service: Urology;  Laterality: N/A;  Bilateral lymph node dissection  . TOTAL HIP ARTHROPLASTY Right 04/20/2013   Procedure: TOTAL HIP ARTHROPLASTY;  Surgeon: Carole Civil, MD;  Location: [facility];  Service: Orthopedics;  Laterality: Right;    There were no vitals filed for this  visit.      Subjective Assessment - 05/04/16 0954    Subjective Pt continues to report he is feeling better. HEP is going well. No concerns with pain at this time.    Pertinent History Rt THA 2015, DM, CKD, HTN, gout, prostate cancer   Currently in Pain? No/denies                         [facility] Adult PT Treatment/Exercise - 05/04/16 0001      Ambulation/Gait   Gait Comments lateral side stepping foeet forward: 2x47ft bilat     Exercises   Other Exercises  seated trunk rotation Lt/Rt with blue TB for second set   seated on a rolling stool; hips flexed to 75 degrees     Lumbar Exercises: Stretches   Hip Flexor Stretch 2 reps;30 seconds   Hip Flexor Stretch Limitations half kneeling on airex faom     Lumbar Exercises: Standing   Other Standing Lumbar Exercises standing wall march with same UE/LE 2x15 each    Other Standing Lumbar Exercises forward step up without BUE support and contralateral knee drive [id number] reps each   4" step, no hands, 2x10, second set with 5000g ball     Lumbar Exercises: Seated   Long Arc Quad on Chair Both;15 reps;2 sets  LAQ on Chair Weights (lbs) 10   LAQ on Chair Limitations arms crossed, sitting on dyna disc      Lumbar Exercises: Supine   Bent Knee Raise 10 reps   Bent Knee Raise Limitations ab set with BLE raise/lower from 6" box.    Bridge 15 reps   Other Supine Lumbar Exercises ab set 90/90 supine: 1x15     Lumbar Exercises: Prone   Opposite Arm/Leg Raise 10 reps;Right arm/Left leg;Left arm/Right leg  very limited mobility in t-spine, shoulder UE bilat                  PT Short Term Goals - 04/14/16 1342      PT SHORT TERM GOAL #1   Title Pt will demo consistency and independence with his HEP to improve flexibility and activity tolerance.   Time 2   Period Weeks   Status New     PT SHORT TERM GOAL #2   Title Pt will demo proper set up and use of lumbar roll without assisatnce to improve sitting posture and  lumbar mobility.   Time 2   Period Weeks   Status New           PT Long Term Goals - 04/14/16 1353      PT LONG TERM GOAL #1   Title Pt will demo negative thomas test on each LE to improve standing posture and hip extensor activation during ambulation.   Time 6   Period Weeks   Status New     PT LONG TERM GOAL #2   Title Pt will demo improved eccentric quad control evident by his ability to descend atleast 4, 6" steps without excessive weight shift Lt and Rt, x3 trials.    Time 6   Period Weeks   Status New     PT LONG TERM GOAL #3   Title Pt will demo improved hip extensor strength to atleast 5/5 MMT to improve his ability to ambulate and ascend/descend steps without signficant difficutly or discomfort.    Time 6   Period Weeks   Status New     PT LONG TERM GOAL #4   Title Pt will perform 5x sit to stand in less than 10 sec without UE support and without crashing into the chair to demonstrate improved functional strength and power.    Time 6   Period Weeks   Status New     PT LONG TERM GOAL #5   Title Pt will demo improved activity tolerance and endurance evident by his ability to ambulate atleast 5 min consecutively without onset of anterior thigh pain.    Time 6   Period Weeks   Status New               Plan - 05/04/16 1009    Clinical Impression Statement Pt toelrating sessionw ell today, only minimally limited by fatigue, no increease in pain. Continued with POC and progressed as appropriate. No major changes at this time. Pt most challenged by abdominal strengthening this session. Left foot/ankle requires furthe rassessment with noted heavy calcaneal eversion on left, with noted wearing of left instep heel.  Right gluteal weakness still moderate to heavy and noted during seevral phases of session.    Rehab Potential Good   PT Frequency 2x / week   PT Duration 6 weeks   PT Treatment/Interventions ADLs/Self Care Home Management;Gait training;Stair  training;Functional mobility training;Therapeutic activities;Therapeutic exercise;Patient/family education;Neuromuscular re-education;Manual techniques;Passive range of motion   PT  Next Visit Plan trunk strengthening in supine: progression to dead bug, etc as able to perform with proper breathing; standing functional strengthening to improve quad endurance and hip flexibility.     PT Home Exercise Plan hamstrings, quad, and hip flexor stretches, standing wall march    Consulted and Agree with Plan of Care Patient      Patient will benefit from skilled therapeutic intervention in order to improve the following deficits and impairments:  Abnormal gait, Decreased activity tolerance, Decreased endurance, Decreased range of motion, Decreased strength, Impaired flexibility, Postural dysfunction, Pain, Difficulty walking, Increased muscle spasms  Visit Diagnosis: Other abnormalities of gait and mobility  Muscle weakness (generalized)  Pain in right thigh  Pain in left thigh     Problem List Patient Active Problem List   Diagnosis Date Noted  . HTN (hypertension) 12/10/2013  . Status post THR (total hip replacement) 05/01/2013  . Arthritis of right hip 04/20/2013  . Osteoarthritis of right hip 03/29/2013  . Prediabetes 03/26/2013  . Hyperlipidemia 03/26/2013  . Microproteinuria 03/26/2013  . Night sweats 03/26/2013  . Gout 03/26/2013  . Office hypertension 03/09/2013  . Osteoarthritis of right knee 11/23/2012  . Osteoarthritis of hip 09/05/2012  . Back pain 09/05/2012  . Prostate cancer ([place]) 04/20/2011   10:33 AM, 05/04/16 Etta Grandchild, PT, DPT Physical Therapist at [facility] [phone] (office)     [place], [place], [address] Phone: [phone]   Fax:  [phone]  Name: MEKHAI GAYMON MRN: [id number] Date of Birth: [birth year]

## 2016-05-05 ENCOUNTER — Ambulatory Visit (HOSPITAL_COMMUNITY): Payer: Commercial Managed Care - HMO | Admitting: Physical Therapy

## 2016-05-05 DIAGNOSIS — M79651 Pain in right thigh: Secondary | ICD-10-CM

## 2016-05-05 DIAGNOSIS — M79652 Pain in left thigh: Secondary | ICD-10-CM

## 2016-05-05 DIAGNOSIS — R2689 Other abnormalities of gait and mobility: Secondary | ICD-10-CM

## 2016-05-05 DIAGNOSIS — M6281 Muscle weakness (generalized): Secondary | ICD-10-CM | POA: Diagnosis not present

## 2016-05-05 NOTE — Therapy (Signed)
[place], [place], [address] Phone: [phone]   Fax:  [phone]  Physical Therapy Treatment(Re-Assessment)  Patient Details  Name: [name] [name] MRN: [id number] Date of Birth: 13-Aug-1950 Referring Provider: [name], MD  Encounter Date: 05/05/2016      PT End of Session - 05/05/16 1033    Visit Number 6   Number of Visits 12   Date for PT Re-Evaluation 05/26/16   Authorization Type [employer] (G-codes done 6th session)   Authorization Time Period 04/14/16 to 05/26/16   Authorization - Visit Number 6   Authorization - Number of Visits 16   PT Start Time 0945   PT Stop Time 1027   PT Time Calculation (min) 42 min   Activity Tolerance Patient tolerated treatment well   Behavior During Therapy [facility] for tasks assessed/performed      Past Medical History:  Diagnosis Date  . Arthritis    right knee and hip   . Cancer [facility])    prostate cancer   . Chronic kidney disease    prostate cancer   . Diabetes mellitus without complication ([place])   . GERD (gastroesophageal reflux disease)   . Gout   . Gout   . Hypercholesteremia   . Hyperglycemia   . Hyperlipidemia   . Hypertension     Past Surgical History:  Procedure Laterality Date  . COLONOSCOPY    . COLONOSCOPY N/A 10/22/2013   Procedure: COLONOSCOPY;  Surgeon: Danie Binder, MD;  [facility];  Service: Endoscopy;  Laterality: N/A;  1115  . HERNIA REPAIR     right inguinal hernia repair   . NASAL SEPTUM SURGERY    . OTHER SURGICAL HISTORY     deviated septum surgery   . ROBOT ASSISTED LAPAROSCOPIC RADICAL PROSTATECTOMY  04/20/2011   Procedure: ROBOTIC ASSISTED LAPAROSCOPIC RADICAL PROSTATECTOMY;  Surgeon: Malka So, MD;  Location: [facility];  Service: Urology;  Laterality: N/A;  Bilateral lymph node dissection  . TOTAL HIP ARTHROPLASTY Right 04/20/2013   Procedure: TOTAL HIP ARTHROPLASTY;  Surgeon: Carole Civil, MD;   Location: [facility];  Service: Orthopedics;  Laterality: Right;    There were no vitals filed for this visit.      Subjective Assessment - 05/05/16 0946    Subjective Patient arrives stating he is continuing to feel better; the exercises are getting easier and he has noticed that his mobility in general is much easier and better. Stairs are much easier. He does still have pain in the front of his legs when he overdoes it. He rates himself as being 75-80%; his biggest concern is keeping up with the progress that he has made with PT. He continues to state his surprise at his perceived progess with PT.    Pertinent History Rt THA 2015, DM, CKD, HTN, gout, prostate cancer   How long can you sit comfortably? 2/14- unlimited    How long can you stand comfortably? 2/14- 30 minutes if he can shift weight from foot to foot    How long can you walk comfortably? 2/14- at least a mile    Patient Stated Goals improve activity tolerance   Currently in Pain? No/denies  at worst his pain can get to 5/10; he reports it is hard to identify patterns that make his pain worse and better at this point             [facility] PT Assessment - 05/05/16 0001  Observation/Other Assessments   Observations leg length discrepancy with L being longer than R: able to correct with MET but no apparent effect on onset of symptoms with gait. Significant calcaneal eversion noted L LE.      Strength   Right Hip Flexion 4+/5   Right Hip Extension --  unable to accurately test due to ROM limits    Right Hip ABduction 3+/5   Left Hip Flexion 4+/5  reduced ROM    Left Hip Extension --  unable to accurately test due to ROM limits    Left Hip ABduction 4+/5   Right Knee Flexion 5/5   Right Knee Extension 4+/5   Left Knee Flexion 4+/5   Left Knee Extension 5/5     Flexibility   Hamstrings R moderately limited, L mild limitation    Quadriceps severe limitation B, no greater than 90 degrees with passive stretch       Transfers   Five time sit to stand comments  11.6 no UEs      Ambulation/Gait   Gait Comments TM 2.5MPH at 2% incline for 2:30 minutes with mild exacerbation of symptoms and increasing fatigue; increased to 4% grade at 2:30  and  symptoms rated as being 4/10 in intensity, increasing to 5/10 at 4:30 mark.   stopped at 4:30; patient reports reduced intensity with test     6 minute walk test results    Aerobic Endurance Distance Walked 772   Endurance additional comments 3MWT no rest, symptoms started at 1:30 on level surface, resolve with sitting down                              PT Education - 05/05/16 1033    Education provided Yes   Education Details progress with skilled PT services, POC moving forward    Person(s) Educated Patient   Methods Explanation   Comprehension Verbalized understanding          PT Short Term Goals - 05/05/16 1014      PT SHORT TERM GOAL #1   Title Pt will demo consistency and independence with his HEP to improve flexibility and activity tolerance.   Baseline 2/14- reports compliance, able to list examples of exercises he is working on    Time 2   Period Weeks   Status Achieved     PT SHORT TERM GOAL #2   Title Pt will demo proper set up and use of lumbar roll without assisatnce to improve sitting posture and lumbar mobility.   Time 2   Period Weeks   Status Achieved           PT Long Term Goals - 05/05/16 1016      PT LONG TERM GOAL #1   Title Pt will demo negative thomas test on each LE to improve standing posture and hip extensor activation during ambulation.   Baseline 2/14- ongoing flexiblity deficits as noted in prone    Time 6   Period Weeks   Status On-going     PT LONG TERM GOAL #2   Title Pt will demo improved eccentric quad control evident by his ability to descend atleast 4, 6" steps without excessive weight shift Lt and Rt, x3 trials.    Baseline 2/14- ongoing difficulty with eccentric control on stairs     Time 6   Period Weeks   Status On-going     PT LONG TERM GOAL #3  Title Pt will demo improved hip extensor strength to atleast 5/5 MMT to improve his ability to ambulate and ascend/descend steps without signficant difficutly or discomfort.    Baseline 05/08/22- unable to accurately test due to hip flexor tightness/limited hip ROM    Time 6   Period Weeks   Status On-going     PT LONG TERM GOAL #4   Title Pt will perform 5x sit to stand in less than 10 sec without UE support and without crashing into the chair to demonstrate improved functional strength and power.    Baseline 05-08-22- 11.6    Time 6   Period Weeks   Status On-going     PT LONG TERM GOAL #5   Title Pt will demo improved activity tolerance and endurance evident by his ability to ambulate atleast 5 min consecutively without onset of anterior thigh pain.    Baseline 2022/05/08-  continues to exacerbate after 1:30 with over ground walking and after 2:30 with TM wakling on incline    Time 6   Period Weeks   Status On-going               Plan - 05/08/2016 1209    Clinical Impression Statement Re-assessment performed today. Patient reports he feels he is starting to get better with skilled PT services and has noted improvements in function and task performance throughout his day; he does express interest in guidance for strategies to continue this exercise after PT is over. Examination reveals ongoing impairments in muscle flexibility, functional strength, hip flexor stiffness/hip extension ROM, posture, and reduced tolerance to extended walking based tasks due to increase in symptom intensity. Noted leg length discrepancy today with L being longer than R; able to correct with MET however this did not appear to change timeframe for onset of symptoms with walking. Patient does appear to be making some slow progress with PT at this time however; recommend continuation of skilled PT services in order to continue attempting to address  impairments with possible DC if no significant progress is made at time of next re-assessment.    Rehab Potential Good   PT Frequency 2x / week   PT Duration 6 weeks   PT Treatment/Interventions ADLs/Self Care Home Management;Gait training;Stair training;Functional mobility training;Therapeutic activities;Therapeutic exercise;Patient/family education;Neuromuscular re-education;Manual techniques;Passive range of motion   PT Next Visit Plan trunk strengthening in supine: progression to dead bug, etc as able to perform with proper breathing; standing functional strengthening to improve quad endurance and hip flexibility.     PT Home Exercise Plan hamstrings, quad, and hip flexor stretches, standing wall march    Consulted and Agree with Plan of Care Patient      Patient will benefit from skilled therapeutic intervention in order to improve the following deficits and impairments:  Abnormal gait, Decreased activity tolerance, Decreased endurance, Decreased range of motion, Decreased strength, Impaired flexibility, Postural dysfunction, Pain, Difficulty walking, Increased muscle spasms  Visit Diagnosis: Other abnormalities of gait and mobility  Muscle weakness (generalized)  Pain in right thigh  Pain in left thigh       G-Codes - 05-08-16 1211    Functional Assessment Tool Used Based on clinical assessmnet of strength, symptoms, posture, muscle flexilbity, clinical presentation    Functional Limitation Mobility: Walking and moving around   Mobility: Walking and Moving Around Current Status [phone]) At least 20 percent but less than 40 percent impaired, limited or restricted   Mobility: Walking and Moving Around Goal Status ([id number]) At least 1  percent but less than 20 percent impaired, limited or restricted      Problem List Patient Active Problem List   Diagnosis Date Noted  . HTN (hypertension) 12/10/2013  . Status post THR (total hip replacement) 05/01/2013  . Arthritis of right hip  04/20/2013  . Osteoarthritis of right hip 03/29/2013  . Prediabetes 03/26/2013  . Hyperlipidemia 03/26/2013  . Microproteinuria 03/26/2013  . Night sweats 03/26/2013  . Gout 03/26/2013  . Office hypertension 03/09/2013  . Osteoarthritis of right knee 11/23/2012  . Osteoarthritis of hip 09/05/2012  . Back pain 09/05/2012  . Prostate cancer ([place]) 04/20/2011    Deniece Ree PT, DPT [phone]  [place] [address] [place], [place], [address] Phone: [phone]   Fax:  [phone]  Name: [name] [name] MRN: [id number] Date of Birth: 1950/12/30

## 2016-05-07 ENCOUNTER — Ambulatory Visit (HOSPITAL_COMMUNITY): Payer: Commercial Managed Care - HMO | Admitting: Physical Therapy

## 2016-05-12 ENCOUNTER — Ambulatory Visit (HOSPITAL_COMMUNITY): Payer: Commercial Managed Care - HMO

## 2016-05-12 DIAGNOSIS — R0683 Snoring: Secondary | ICD-10-CM | POA: Diagnosis not present

## 2016-05-12 DIAGNOSIS — R2689 Other abnormalities of gait and mobility: Secondary | ICD-10-CM

## 2016-05-12 DIAGNOSIS — G4733 Obstructive sleep apnea (adult) (pediatric): Secondary | ICD-10-CM | POA: Diagnosis not present

## 2016-05-12 DIAGNOSIS — E785 Hyperlipidemia, unspecified: Secondary | ICD-10-CM | POA: Diagnosis not present

## 2016-05-12 DIAGNOSIS — M6281 Muscle weakness (generalized): Secondary | ICD-10-CM | POA: Diagnosis not present

## 2016-05-12 DIAGNOSIS — M79651 Pain in right thigh: Secondary | ICD-10-CM

## 2016-05-12 DIAGNOSIS — I10 Essential (primary) hypertension: Secondary | ICD-10-CM | POA: Diagnosis not present

## 2016-05-12 DIAGNOSIS — M79652 Pain in left thigh: Secondary | ICD-10-CM

## 2016-05-12 NOTE — Therapy (Signed)
[place], [place], [address] Phone: [phone]   Fax:  [phone]  Physical Therapy Treatment  Patient Details  Name: [name] [name] MRN: [id number] Date of Birth: 03/17/1951 Referring Provider: [name], MD  Encounter Date: 05/12/2016      PT End of Session - 05/12/16 0958    Visit Number 7   Number of Visits 12   Date for PT Re-Evaluation 05/26/16   Authorization Type [employer] (G-codes done 6th session)   Authorization Time Period 04/14/16 to 05/26/16   Authorization - Visit Number 7   Authorization - Number of Visits 16   PT Start Time [id number]   PT Stop Time 1032   PT Time Calculation (min) 39 min   Activity Tolerance Patient tolerated treatment well   Behavior During Therapy [facility] for tasks assessed/performed      Past Medical History:  Diagnosis Date  . Arthritis    right knee and hip   . Cancer [facility])    prostate cancer   . Chronic kidney disease    prostate cancer   . Diabetes mellitus without complication ([place])   . GERD (gastroesophageal reflux disease)   . Gout   . Gout   . Hypercholesteremia   . Hyperglycemia   . Hyperlipidemia   . Hypertension     Past Surgical History:  Procedure Laterality Date  . COLONOSCOPY    . COLONOSCOPY N/A 10/22/2013   Procedure: COLONOSCOPY;  Surgeon: Danie Binder, MD;  [facility];  Service: Endoscopy;  Laterality: N/A;  1115  . HERNIA REPAIR     right inguinal hernia repair   . NASAL SEPTUM SURGERY    . OTHER SURGICAL HISTORY     deviated septum surgery   . ROBOT ASSISTED LAPAROSCOPIC RADICAL PROSTATECTOMY  04/20/2011   Procedure: ROBOTIC ASSISTED LAPAROSCOPIC RADICAL PROSTATECTOMY;  Surgeon: Malka So, MD;  Location: [facility];  Service: Urology;  Laterality: N/A;  Bilateral lymph node dissection  . TOTAL HIP ARTHROPLASTY Right 04/20/2013   Procedure: TOTAL HIP ARTHROPLASTY;  Surgeon: Carole Civil, MD;  Location: [facility] OR[facility];   Service: Orthopedics;  Laterality: Right;    There were no vitals filed for this visit.      Subjective Assessment - 05/12/16 0954    Subjective Pt stated he is feeling good today, no reports of pain today.     Pertinent History Rt THA 2015, DM, CKD, HTN, gout, prostate cancer   Patient Stated Goals improve activity tolerance   Currently in Pain? No/denies              [facility] Adult PT Treatment/Exercise - 05/12/16 0001      Lumbar Exercises: Stretches   Active Hamstring Stretch 3 reps;30 seconds   Active Hamstring Stretch Limitations supine with rope   Lower Trunk Rotation 10 seconds   Lower Trunk Rotation Limitations 5x   Hip Flexor Stretch 2 reps;30 seconds   Hip Flexor Stretch Limitations half kneeling on airex faom     Lumbar Exercises: Standing   Other Standing Lumbar Exercises forward step up without BUE support and contralateral knee drive [id number] reps each x 2 sets     Lumbar Exercises: Seated   Hip Flexion on Ball Limitations trunk rotation with BTB 10x     Lumbar Exercises: Supine   Bent Knee Raise 10 reps;5 seconds   Bent Knee Raise Limitations dead bug with cueing for breathing     Lumbar Exercises:  Prone   Opposite Arm/Leg Raise 10 reps;Right arm/Left leg;Left arm/Right leg   Opposite Arm/Leg Raise Limitations limited thoracic mobility and hip extension                  PT Short Term Goals - 05/05/16 1014      PT SHORT TERM GOAL #1   Title Pt will demo consistency and independence with his HEP to improve flexibility and activity tolerance.   Baseline 2/14- reports compliance, able to list examples of exercises he is working on    Time 2   Period Weeks   Status Achieved     PT SHORT TERM GOAL #2   Title Pt will demo proper set up and use of lumbar roll without assisatnce to improve sitting posture and lumbar mobility.   Time 2   Period Weeks   Status Achieved           PT Long Term Goals - 05/05/16 1016      PT LONG TERM GOAL #1    Title Pt will demo negative thomas test on each LE to improve standing posture and hip extensor activation during ambulation.   Baseline 2/14- ongoing flexiblity deficits as noted in prone    Time 6   Period Weeks   Status On-going     PT LONG TERM GOAL #2   Title Pt will demo improved eccentric quad control evident by his ability to descend atleast 4, 6" steps without excessive weight shift Lt and Rt, x3 trials.    Baseline 2/14- ongoing difficulty with eccentric control on stairs    Time 6   Period Weeks   Status On-going     PT LONG TERM GOAL #3   Title Pt will demo improved hip extensor strength to atleast 5/5 MMT to improve his ability to ambulate and ascend/descend steps without signficant difficutly or discomfort.    Baseline 2/14- unable to accurately test due to hip flexor tightness/limited hip ROM    Time 6   Period Weeks   Status On-going     PT LONG TERM GOAL #4   Title Pt will perform 5x sit to stand in less than 10 sec without UE support and without crashing into the chair to demonstrate improved functional strength and power.    Baseline 2/14- 11.6    Time 6   Period Weeks   Status On-going     PT LONG TERM GOAL #5   Title Pt will demo improved activity tolerance and endurance evident by his ability to ambulate atleast 5 min consecutively without onset of anterior thigh pain.    Baseline 2/14-  continues to exacerbate after 1:30 with over ground walking and after 2:30 with TM wakling on incline    Time 6   Period Weeks   Status On-going               Plan - 05/12/16 1014    Clinical Impression Statement Session focus on improving trunk stability and improving overall LE mobilty.  Therapist facilitation to improve form for stabiltiy with majority of exercises.  Improving breathing technqiues noted wiht therex this session, less cueing required.  Pt continues to demonstrate instability due to weakness in core and gluteal strengthening with increased difficulty  especially with SLS activities.  EOS pt limited by fatigue.  No reports of pain through session.     Rehab Potential Good   PT Frequency 2x / week   PT Duration 6 weeks   PT  Treatment/Interventions ADLs/Self Care Home Management;Gait training;Stair training;Functional mobility training;Therapeutic activities;Therapeutic exercise;Patient/family education;Neuromuscular re-education;Manual techniques;Passive range of motion   PT Next Visit Plan trunk strengthening in supine: progression to dead bug, etc as able to perform with proper breathing; standing functional strengthening to improve quad endurance and hip flexibility.     PT Home Exercise Plan hamstrings, quad, and hip flexor stretches, standing wall march       Patient will benefit from skilled therapeutic intervention in order to improve the following deficits and impairments:  Abnormal gait, Decreased activity tolerance, Decreased endurance, Decreased range of motion, Decreased strength, Impaired flexibility, Postural dysfunction, Pain, Difficulty walking, Increased muscle spasms  Visit Diagnosis: Other abnormalities of gait and mobility  Muscle weakness (generalized)  Pain in right thigh  Pain in left thigh     Problem List Patient Active Problem List   Diagnosis Date Noted  . HTN (hypertension) 12/10/2013  . Status post THR (total hip replacement) 05/01/2013  . Arthritis of right hip 04/20/2013  . Osteoarthritis of right hip 03/29/2013  . Prediabetes 03/26/2013  . Hyperlipidemia 03/26/2013  . Microproteinuria 03/26/2013  . Night sweats 03/26/2013  . Gout 03/26/2013  . Office hypertension 03/09/2013  . Osteoarthritis of right knee 11/23/2012  . Osteoarthritis of hip 09/05/2012  . Back pain 09/05/2012  . Prostate cancer [facility]) 04/20/2011   Ihor Austin, Otho; [place]  Aldona Lento 05/12/2016, 11:46 AM  [place], [place],  [address] Phone: [phone]   Fax:  [phone]  Name: [name] [name] MRN: [id number] Date of Birth: [birth year]

## 2016-05-14 ENCOUNTER — Ambulatory Visit (HOSPITAL_COMMUNITY): Payer: Commercial Managed Care - HMO | Admitting: Physical Therapy

## 2016-05-14 DIAGNOSIS — M79652 Pain in left thigh: Secondary | ICD-10-CM

## 2016-05-14 DIAGNOSIS — M6281 Muscle weakness (generalized): Secondary | ICD-10-CM | POA: Diagnosis not present

## 2016-05-14 DIAGNOSIS — M79651 Pain in right thigh: Secondary | ICD-10-CM | POA: Diagnosis not present

## 2016-05-14 DIAGNOSIS — R2689 Other abnormalities of gait and mobility: Secondary | ICD-10-CM | POA: Diagnosis not present

## 2016-05-14 NOTE — Therapy (Signed)
[place], [place], [address] Phone: [phone]   Fax:  [phone]  Physical Therapy Treatment  Patient Details  Name: [name] [name] MRN: [id number] Date of Birth: 1950/03/28 Referring Provider: [name], MD  Encounter Date: 05/14/2016      PT End of Session - 05/14/16 1027    Visit Number 8   Number of Visits 12   Date for PT Re-Evaluation 05/26/16   Authorization Type [employer] (G-codes done 6th session)   Authorization Time Period 04/14/16 to 05/26/16   Authorization - Visit Number 8   Authorization - Number of Visits 16   PT Start Time [id number]   PT Stop Time [id number]   PT Time Calculation (min) 41 min   Activity Tolerance Patient tolerated treatment well;No increased pain   Behavior During Therapy WFL for tasks assessed/performed      Past Medical History:  Diagnosis Date  . Arthritis    right knee and hip   . Cancer [facility])    prostate cancer   . Chronic kidney disease    prostate cancer   . Diabetes mellitus without complication ([place])   . GERD (gastroesophageal reflux disease)   . Gout   . Gout   . Hypercholesteremia   . Hyperglycemia   . Hyperlipidemia   . Hypertension     Past Surgical History:  Procedure Laterality Date  . COLONOSCOPY    . COLONOSCOPY N/A 10/22/2013   Procedure: COLONOSCOPY;  Surgeon: Danie Binder, MD;  [facility];  Service: Endoscopy;  Laterality: N/A;  1115  . HERNIA REPAIR     right inguinal hernia repair   . NASAL SEPTUM SURGERY    . OTHER SURGICAL HISTORY     deviated septum surgery   . ROBOT ASSISTED LAPAROSCOPIC RADICAL PROSTATECTOMY  04/20/2011   Procedure: ROBOTIC ASSISTED LAPAROSCOPIC RADICAL PROSTATECTOMY;  Surgeon: Malka So, MD;  Location[facility];  Service: Urology;  Laterality: N/A;  Bilateral lymph node dissection  . TOTAL HIP ARTHROPLASTY Right 04/20/2013   Procedure: TOTAL HIP ARTHROPLASTY;  Surgeon: Carole Civil, MD;   Location: [facility];  Service: Orthopedics;  Laterality: Right;    There were no vitals filed for this visit.      Subjective Assessment - 05/14/16 0948    Subjective Pt states that he feels his anterior thigh discomfort is not nearly as bad as it used to be and feels that it also goes away alot faster.    Pertinent History Rt THA 2015, DM, CKD, HTN, gout, prostate cancer   Patient Stated Goals improve activity tolerance   Currently in Pain? No/denies                         [facility] Adult PT Treatment/Exercise - 05/14/16 0001      Exercises   Other Exercises  seated trunk rotation with blue TB 2x15 reps each direction     Lumbar Exercises: Standing   Other Standing Lumbar Exercises hip hike, 2x15 reps each; Attempted single leg stance with UE punches and derotation bu pt unable to maintain balance   Other Standing Lumbar Exercises 6" forward step up without UE support and contralateral knee drive [id number] reps each      Lumbar Exercises: Seated   Hip Flexion on Ball 10 reps;Both;Left   Hip Flexion on Ball Limitations x2 sets with ball in corner   Sit to Stand 15 reps  Sit to Stand Limitations blue TB around knees      Lumbar Exercises: Supine   Other Supine Lumbar Exercises ab set with alt LE/UE flexion/extension 2x10 reps each                 PT Education - 05/14/16 1027    Education provided Yes   Education Details minor adjustments to pt's HEP to improve balance and single leg stability    Person(s) Educated Patient   Methods Explanation   Comprehension Verbalized understanding          PT Short Term Goals - 05/05/16 1014      PT SHORT TERM GOAL #1   Title Pt will demo consistency and independence with his HEP to improve flexibility and activity tolerance.   Baseline 2/14- reports compliance, able to list examples of exercises he is working on    Time 2   Period Weeks   Status Achieved     PT SHORT TERM GOAL #2   Title Pt will demo proper set  up and use of lumbar roll without assisatnce to improve sitting posture and lumbar mobility.   Time 2   Period Weeks   Status Achieved           PT Long Term Goals - 05/05/16 1016      PT LONG TERM GOAL #1   Title Pt will demo negative thomas test on each LE to improve standing posture and hip extensor activation during ambulation.   Baseline 2/14- ongoing flexiblity deficits as noted in prone    Time 6   Period Weeks   Status On-going     PT LONG TERM GOAL #2   Title Pt will demo improved eccentric quad control evident by his ability to descend atleast 4, 6" steps without excessive weight shift Lt and Rt, x3 trials.    Baseline 2/14- ongoing difficulty with eccentric control on stairs    Time 6   Period Weeks   Status On-going     PT LONG TERM GOAL #3   Title Pt will demo improved hip extensor strength to atleast 5/5 MMT to improve his ability to ambulate and ascend/descend steps without signficant difficutly or discomfort.    Baseline 2/14- unable to accurately test due to hip flexor tightness/limited hip ROM    Time 6   Period Weeks   Status On-going     PT LONG TERM GOAL #4   Title Pt will perform 5x sit to stand in less than 10 sec without UE support and without crashing into the chair to demonstrate improved functional strength and power.    Baseline 2/14- 11.6    Time 6   Period Weeks   Status On-going     PT LONG TERM GOAL #5   Title Pt will demo improved activity tolerance and endurance evident by his ability to ambulate atleast 5 min consecutively without onset of anterior thigh pain.    Baseline 2/14-  continues to exacerbate after 1:30 with over ground walking and after 2:30 with TM wakling on incline    Time 6   Period Weeks   Status On-going               Plan - 05/14/16 1030    Clinical Impression Statement Continued today with therex to further address single leg stability and trunk strength needing intermittent cues to improve his form. Pt was  able to perform all exercises with increased reps and resistance without report of  pain/discomfort. He continues to diligently perform his HEP and feels his symptoms have improved ~50% since the start of PT. Will continue with current POC.   Rehab Potential Good   PT Frequency 2x / week   PT Duration 6 weeks   PT Treatment/Interventions ADLs/Self Care Home Management;Gait training;Stair training;Functional mobility training;Therapeutic activities;Therapeutic exercise;Patient/family education;Neuromuscular re-education;Manual techniques;Passive range of motion   PT Next Visit Plan single leg hip stability and hip flexibility and discuss specific areas of conern in pt's daily activity that can be addressed further before upcoming re-evaluation.    PT Home Exercise Plan hamstrings, quad, and hip flexor stretches, standing wall march    Consulted and Agree with Plan of Care Patient      Patient will benefit from skilled therapeutic intervention in order to improve the following deficits and impairments:  Abnormal gait, Decreased activity tolerance, Decreased endurance, Decreased range of motion, Decreased strength, Impaired flexibility, Postural dysfunction, Pain, Difficulty walking, Increased muscle spasms  Visit Diagnosis: Other abnormalities of gait and mobility  Muscle weakness (generalized)  Pain in right thigh  Pain in left thigh     Problem List Patient Active Problem List   Diagnosis Date Noted  . HTN (hypertension) 12/10/2013  . Status post THR (total hip replacement) 05/01/2013  . Arthritis of right hip 04/20/2013  . Osteoarthritis of right hip 03/29/2013  . Prediabetes 03/26/2013  . Hyperlipidemia 03/26/2013  . Microproteinuria 03/26/2013  . Night sweats 03/26/2013  . Gout 03/26/2013  . Office hypertension 03/09/2013  . Osteoarthritis of right knee 11/23/2012  . Osteoarthritis of hip 09/05/2012  . Back pain 09/05/2012  . Prostate cancer ([place]) 04/20/2011    12:11  PM,05/14/16 Elly Modena PT, DPT [facility] [place] [address] [place], [place], [address] Phone: [phone]   Fax:  [phone]  Name: [name] [name] MRN: [id number] Date of Birth: 23-Mar-1950

## 2016-05-17 ENCOUNTER — Ambulatory Visit (HOSPITAL_COMMUNITY): Payer: Commercial Managed Care - HMO

## 2016-05-17 ENCOUNTER — Encounter (HOSPITAL_COMMUNITY): Payer: Self-pay

## 2016-05-17 DIAGNOSIS — M79651 Pain in right thigh: Secondary | ICD-10-CM | POA: Diagnosis not present

## 2016-05-17 DIAGNOSIS — M6281 Muscle weakness (generalized): Secondary | ICD-10-CM

## 2016-05-17 DIAGNOSIS — R2689 Other abnormalities of gait and mobility: Secondary | ICD-10-CM

## 2016-05-17 DIAGNOSIS — M79652 Pain in left thigh: Secondary | ICD-10-CM

## 2016-05-17 NOTE — Therapy (Signed)
[place], [place], [address] Phone: [phone]   Fax:  [phone]  Physical Therapy Treatment  Patient Details  Name: [name] [name] MRN: [id number] Date of Birth: Jul 12, 1950 Referring Provider: [name], MD  Encounter Date: 05/17/2016    Past Medical History:  Diagnosis Date  . Arthritis    right knee and hip   . Cancer [facility])    prostate cancer   . Chronic kidney disease    prostate cancer   . Diabetes mellitus without complication ([place])   . GERD (gastroesophageal reflux disease)   . Gout   . Gout   . Hypercholesteremia   . Hyperglycemia   . Hyperlipidemia   . Hypertension     Past Surgical History:  Procedure Laterality Date  . COLONOSCOPY    . COLONOSCOPY N/A 10/22/2013   Procedure: COLONOSCOPY;  Surgeon: Danie Binder, MD;  [facility];  Service: Endoscopy;  Laterality: N/A;  1115  . HERNIA REPAIR     right inguinal hernia repair   . NASAL SEPTUM SURGERY    . OTHER SURGICAL HISTORY     deviated septum surgery   . ROBOT ASSISTED LAPAROSCOPIC RADICAL PROSTATECTOMY  04/20/2011   Procedure: ROBOTIC ASSISTED LAPAROSCOPIC RADICAL PROSTATECTOMY;  Surgeon: Malka So, MD;  Location: [facility];  Service: Urology;  Laterality: N/A;  Bilateral lymph node dissection  . TOTAL HIP ARTHROPLASTY Right 04/20/2013   Procedure: TOTAL HIP ARTHROPLASTY;  Surgeon: Carole Civil, MD;  Location: [facility];  Service: Orthopedics;  Laterality: Right;    There were no vitals filed for this visit.      Subjective Assessment - 05/17/16 1322    Subjective Pt reports he is doing fine today. He says that things are going well at home/work, HEP is still being performed consistently. He notes no other updates.    Pertinent History Rt THA 2015, DM, CKD, HTN, gout, prostate cancer   Currently in Pain? No/denies            [facility] PT Assessment - 05/17/16 0001      ROM / Strength   AROM / PROM /  Strength PROM     PROM   PROM Assessment Site Hip   Right/Left Hip Right;Left   Left Hip Extension 15 degrees     Strength   Right Hip Extension 4/5   Left Hip Extension 4/5     Right Hip   Right Hip Extension 8                     [facility] Adult PT Treatment/Exercise - 05/17/16 0001      Transfers   Five time sit to stand comments  3x: 7.19s, 8.44s, 7.62s.   hands free     Ambulation/Gait   Ambulation Distance (Feet) 1125 Feet  661ft at start of session; 444ft at session end   Assistive device None   Gait velocity 1.63m/s   Gait Comments Consistent onset of symptoms at around 1:45 both gait trials.     Lumbar Exercises: Standing   Other Standing Lumbar Exercises "Over the fence ER" (SLS c contrlat hip flexion, ER, step down, and return.   2x10 bilat (more difficult in Rt stance)      Lumbar Exercises: Supine   Bridge 10 reps  2x15, knees at 90 degrees      Lumbar Exercises: Prone   Straight Leg Raise 10 reps  2x10 bilat; pillows under  pelvis for ROM     Manual Therapy   Manual Therapy Myofascial release;Passive ROM   Manual therapy comments IASTM to distal/lateral quads: 5 miuntes total   several taut bands throughout, pt reports feels good.    Myofascial Release MFR: active release techniques to bilat vastus lateralis/rectus femoris   4 minutes total    Passive ROM Prone knee flexion stretch: 3x45sec bilat  Supine Thomas Test stretch 2x45sec bilat                  PT Short Term Goals - 05/05/16 1014      PT SHORT TERM GOAL #1   Title Pt will demo consistency and independence with his HEP to improve flexibility and activity tolerance.   Baseline 2/14- reports compliance, able to list examples of exercises he is working on    Time 2   Period Weeks   Status Achieved     PT SHORT TERM GOAL #2   Title Pt will demo proper set up and use of lumbar roll without assisatnce to improve sitting posture and lumbar mobility.   Time 2   Period  Weeks   Status Achieved           PT Long Term Goals - 05/17/16 1550      PT LONG TERM GOAL #1   Title Pt will demo negative thomas test on each LE to improve standing posture and hip extensor activation during ambulation.   Baseline on 2/26 thomas test, pt has 15 degrees hip extension, >5 degrees hip extension on Rt   Time 6   Period Weeks   Status Achieved     PT LONG TERM GOAL #2   Title Pt will demo improved eccentric quad control evident by his ability to descend atleast 4, 6" steps without excessive weight shift Lt and Rt, x3 trials.    Time 6   Period Weeks   Status On-going     PT LONG TERM GOAL #3   Title Pt will demo improved hip extensor strength to atleast 5/5 MMT to improve his ability to ambulate and ascend/descend steps without signficant difficutly or discomfort.    Baseline Hip extension is 4/5 on 2/26.    Time 6   Period Weeks   Status On-going     PT LONG TERM GOAL #4   Title Pt will perform 5x sit to stand in less than 10 sec without UE support and without crashing into the chair to demonstrate improved functional strength and power.    Baseline 7.19s, 8.44s, 7.62s, all on 2/26.    Time 6   Period Weeks   Status Achieved     PT LONG TERM GOAL #5   Title Pt will demo improved activity tolerance and endurance evident by his ability to ambulate atleast 5 min consecutively without onset of anterior thigh pain.    Baseline Improved from pain onset at 1:30 to 1:45.    Time 6   Period Weeks   Status On-going               Plan - 05/17/16 1600    Clinical Impression Statement Pt tolerating session well today, with assessment of progress toward long term goals. Pt has made excellent progressed twoard all long term goals at this time, but continues to have symptomative pain after less than 2 minutes of sustained AMB. Session continues to focus on stabilization program, as well as soft tissue work to address ROM deficits and fascial restriciton  in the  BLE. No changes to POC at this time.    Rehab Potential Fair   PT Frequency 2x / week   PT Duration 6 weeks   PT Treatment/Interventions ADLs/Self Care Home Management;Gait training;Stair training;Functional mobility training;Therapeutic activities;Therapeutic exercise;Patient/family education;Neuromuscular re-education;Manual techniques;Passive range of motion   PT Next Visit Plan Retrial gait tolerance at a slower speed (less than 1.0 m/s); Soft tissue work to [employer]; single leg hip stability and hip flexibility and discuss specific areas of conern in pt's daily activity that can be addressed further before upcoming re-evaluation.    PT Home Exercise Plan hamstrings, quad, and hip flexor stretches, standing wall march    Consulted and Agree with Plan of Care Patient      Patient will benefit from skilled therapeutic intervention in order to improve the following deficits and impairments:  Abnormal gait, Decreased activity tolerance, Decreased endurance, Decreased range of motion, Decreased strength, Impaired flexibility, Postural dysfunction, Pain, Difficulty walking, Increased muscle spasms  Visit Diagnosis: Other abnormalities of gait and mobility  Muscle weakness (generalized)  Pain in right thigh  Pain in left thigh     Problem List Patient Active Problem List   Diagnosis Date Noted  . HTN (hypertension) 12/10/2013  . Status post THR (total hip replacement) 05/01/2013  . Arthritis of right hip 04/20/2013  . Osteoarthritis of right hip 03/29/2013  . Prediabetes 03/26/2013  . Hyperlipidemia 03/26/2013  . Microproteinuria 03/26/2013  . Night sweats 03/26/2013  . Gout 03/26/2013  . Office hypertension 03/09/2013  . Osteoarthritis of right knee 11/23/2012  . Osteoarthritis of hip 09/05/2012  . Back pain 09/05/2012  . Prostate cancer ([place]) 04/20/2011    4:03 PM, 05/17/16 Etta Grandchild, PT, DPT Physical Therapist at [place] [phone]  (office)     [place], [place], [address] Phone: [phone]   Fax:  [phone]  Name: [name] [name] MRN: [id number] Date of Birth: [birth year]

## 2016-05-19 ENCOUNTER — Ambulatory Visit (HOSPITAL_COMMUNITY): Payer: Commercial Managed Care - HMO

## 2016-05-19 DIAGNOSIS — M79651 Pain in right thigh: Secondary | ICD-10-CM

## 2016-05-19 DIAGNOSIS — M6281 Muscle weakness (generalized): Secondary | ICD-10-CM | POA: Diagnosis not present

## 2016-05-19 DIAGNOSIS — R2689 Other abnormalities of gait and mobility: Secondary | ICD-10-CM

## 2016-05-19 DIAGNOSIS — M79652 Pain in left thigh: Secondary | ICD-10-CM

## 2016-05-19 NOTE — Therapy (Signed)
[place], [place], [address] Phone: [phone]   Fax:  [phone]  Physical Therapy Treatment  Patient Details  Name: [name] [name] MRN: [id number] Date of Birth: 1950-08-14 Referring Provider: [name], MD  Encounter Date: 05/19/2016      PT End of Session - 05/19/16 0957    Visit Number 9   Number of Visits 12   Date for PT Re-Evaluation 05/26/16   Authorization Type [employer] (G-codes done 6th session)   Authorization Time Period 04/14/16 to 05/26/16   Authorization - Visit Number 9   Authorization - Number of Visits 16   PT Start Time [id number]   PT Stop Time 1030   PT Time Calculation (min) 42 min   Activity Tolerance Patient tolerated treatment well;No increased pain   Behavior During Therapy WFL for tasks assessed/performed      Past Medical History:  Diagnosis Date  . Arthritis    right knee and hip   . Cancer [facility])    prostate cancer   . Chronic kidney disease    prostate cancer   . Diabetes mellitus without complication ([place])   . GERD (gastroesophageal reflux disease)   . Gout   . Gout   . Hypercholesteremia   . Hyperglycemia   . Hyperlipidemia   . Hypertension     Past Surgical History:  Procedure Laterality Date  . COLONOSCOPY    . COLONOSCOPY N/A 10/22/2013   Procedure: COLONOSCOPY;  Surgeon: Danie Binder, MD;  [facility];  Service: Endoscopy;  Laterality: N/A;  1115  . HERNIA REPAIR     right inguinal hernia repair   . NASAL SEPTUM SURGERY    . OTHER SURGICAL HISTORY     deviated septum surgery   . ROBOT ASSISTED LAPAROSCOPIC RADICAL PROSTATECTOMY  04/20/2011   Procedure: ROBOTIC ASSISTED LAPAROSCOPIC RADICAL PROSTATECTOMY;  Surgeon: Malka So, MD;  Location: [facility];  Service: Urology;  Laterality: N/A;  Bilateral lymph node dissection  . TOTAL HIP ARTHROPLASTY Right 04/20/2013   Procedure: TOTAL HIP ARTHROPLASTY;  Surgeon: Carole Civil, MD;   Location: [facility];  Service: Orthopedics;  Laterality: Right;    There were no vitals filed for this visit.      Subjective Assessment - 05/19/16 0946    Subjective Pt stated he was a little sore on lateral thigh following manual, no real pain today.  Reports compliance with HEP 2x daily.  No reports of other symptoms.   Pertinent History Rt THA 2015, DM, CKD, HTN, gout, prostate cancer   Patient Stated Goals improve activity tolerance   Currently in Pain? No/denies                         [facility] Adult PT Treatment/Exercise - 05/19/16 0001      Ambulation/Gait   Ambulation Distance (Feet) 1185 Feet  2 sets: 1st 633 ft in 3'28"= 1.01; 2nd 552 ft in 2'37"= 1.17   Assistive device None   Gait Pattern Decreased stance time - right;Decreased step length - left;Decreased arm swing - right;Decreased trunk rotation   Gait velocity 1st: 1.01; 2nd 1.17   Gait Comments Decreased cadence s/s onset 1) 2'40"; 2nd s/s at 2'00"     Lumbar Exercises: Standing   Shoulder Extension 10 reps   Shoulder Extension Limitations with SLS marching 5" holds   Other Standing Lumbar Exercises "Over the fence ER" (SLS c contrlat hip  flexion, ER, step down, and return.    Other Standing Lumbar Exercises 6" forward step up without UE support and contralateral knee drive [id number] reps each      Lumbar Exercises: Seated   Sit to Stand 15 reps   Sit to Stand Limitations blue TB around knees rotation     Lumbar Exercises: Supine   Bent Knee Raise 15 reps;5 seconds   Bent Knee Raise Limitations dead bug with cueing for breathing   Bridge 15 reps  15x 2 with RTB and cueing for alingment     Lumbar Exercises: Prone   Straight Leg Raise 10 reps     Manual Therapy   Manual Therapy Myofascial release;Passive ROM   Manual therapy comments IASTM to distal/lateral quads: 5 miuntes total    Myofascial Release MFR: active release techniques to bilat vastus lateralis/rectus femoris                    PT Short Term Goals - 05/05/16 1014      PT SHORT TERM GOAL #1   Title Pt will demo consistency and independence with his HEP to improve flexibility and activity tolerance.   Baseline 2/14- reports compliance, able to list examples of exercises he is working on    Time 2   Period Weeks   Status Achieved     PT SHORT TERM GOAL #2   Title Pt will demo proper set up and use of lumbar roll without assisatnce to improve sitting posture and lumbar mobility.   Time 2   Period Weeks   Status Achieved           PT Long Term Goals - 05/17/16 1550      PT LONG TERM GOAL #1   Title Pt will demo negative thomas test on each LE to improve standing posture and hip extensor activation during ambulation.   Baseline on 2/26 thomas test, pt has 15 degrees hip extension, >5 degrees hip extension on Rt   Time 6   Period Weeks   Status Achieved     PT LONG TERM GOAL #2   Title Pt will demo improved eccentric quad control evident by his ability to descend atleast 4, 6" steps without excessive weight shift Lt and Rt, x3 trials.    Time 6   Period Weeks   Status On-going     PT LONG TERM GOAL #3   Title Pt will demo improved hip extensor strength to atleast 5/5 MMT to improve his ability to ambulate and ascend/descend steps without signficant difficutly or discomfort.    Baseline Hip extension is 4/5 on 2/26.    Time 6   Period Weeks   Status On-going     PT LONG TERM GOAL #4   Title Pt will perform 5x sit to stand in less than 10 sec without UE support and without crashing into the chair to demonstrate improved functional strength and power.    Baseline 7.19s, 8.44s, 7.62s, all on 2/26.    Time 6   Period Weeks   Status Achieved     PT LONG TERM GOAL #5   Title Pt will demo improved activity tolerance and endurance evident by his ability to ambulate atleast 5 min consecutively without onset of anterior thigh pain.    Baseline Improved from pain onset at 1:30 to 1:45.    Time 6    Period Weeks   Status On-going  Plan - 05/19/16 1253    Clinical Impression Statement Session focus on improving core stabilization with gait mechanics, cueing to decreased cadence and improve gait mechanics with prolonged delay of symptom arrival, pt was limited by fatigue with gait.  Continued core and proximal strengthening with cueing for stability.  No reports of pain through session, was limited by fatigue.     Rehab Potential Fair   PT Frequency 2x / week   PT Duration 6 weeks   PT Treatment/Interventions ADLs/Self Care Home Management;Gait training;Stair training;Functional mobility training;Therapeutic activities;Therapeutic exercise;Patient/family education;Neuromuscular re-education;Manual techniques;Passive range of motion   PT Next Visit Plan Retrial gait tolerance at a slower speed (less than 1.0 m/s); Soft tissue work to [employer]; single leg hip stability and hip flexibility and discuss specific areas of conern in pt's daily activity that can be addressed further before upcoming re-evaluation.       Patient will benefit from skilled therapeutic intervention in order to improve the following deficits and impairments:  Abnormal gait, Decreased activity tolerance, Decreased endurance, Decreased range of motion, Decreased strength, Impaired flexibility, Postural dysfunction, Pain, Difficulty walking, Increased muscle spasms  Visit Diagnosis: Other abnormalities of gait and mobility  Muscle weakness (generalized)  Pain in right thigh  Pain in left thigh     Problem List Patient Active Problem List   Diagnosis Date Noted  . HTN (hypertension) 12/10/2013  . Status post THR (total hip replacement) 05/01/2013  . Arthritis of right hip 04/20/2013  . Osteoarthritis of right hip 03/29/2013  . Prediabetes 03/26/2013  . Hyperlipidemia 03/26/2013  . Microproteinuria 03/26/2013  . Night sweats 03/26/2013  . Gout 03/26/2013  . Office hypertension 03/09/2013  .  Osteoarthritis of right knee 11/23/2012  . Osteoarthritis of hip 09/05/2012  . Back pain 09/05/2012  . Prostate cancer [facility]) 04/20/2011   Ihor Austin, [place]; [place]  Aldona Lento 05/19/2016, 1:05 PM  [place], [place], [address] Phone: [phone]   Fax:  [phone]  Name: [name] [name] MRN: [id number] Date of Birth: May 29, 1950

## 2016-05-26 ENCOUNTER — Ambulatory Visit (HOSPITAL_COMMUNITY): Payer: Commercial Managed Care - HMO | Attending: Orthopedic Surgery

## 2016-05-26 DIAGNOSIS — M79651 Pain in right thigh: Secondary | ICD-10-CM | POA: Diagnosis not present

## 2016-05-26 DIAGNOSIS — R2689 Other abnormalities of gait and mobility: Secondary | ICD-10-CM | POA: Diagnosis not present

## 2016-05-26 DIAGNOSIS — M79652 Pain in left thigh: Secondary | ICD-10-CM | POA: Diagnosis not present

## 2016-05-26 DIAGNOSIS — M6281 Muscle weakness (generalized): Secondary | ICD-10-CM | POA: Insufficient documentation

## 2016-05-26 NOTE — Addendum Note (Signed)
Addended by: Etta Grandchild on: 05/26/2016 12:52 PM   Modules accepted: Orders

## 2016-05-26 NOTE — Therapy (Signed)
[place], [place], [address] Phone: [phone]   Fax:  [phone]  Physical Therapy Re-Assessment  Patient Details  Name: [name] [name] MRN: [id number] Date of Birth: 12-31-1950 Referring Provider: [name]   Encounter Date: 05/26/2016      PT End of Session - 05/26/16 1025    Visit Number 10   Number of Visits 12   Date for PT Re-Evaluation 06/26/16   Authorization Type [employer] (G-codes done 6th session)   Authorization Time Period 04/14/16 to 05/26/16; 05/26/16-06/26/16   Authorization - Visit Number 10   Authorization - Number of Visits 16   PT Start Time 0539   PT Stop Time 1022   PT Time Calculation (min) 38 min   Activity Tolerance Patient tolerated treatment well;No increased pain   Behavior During Therapy WFL for tasks assessed/performed      Past Medical History:  Diagnosis Date  . Arthritis    right knee and hip   . Cancer [facility])    prostate cancer   . Chronic kidney disease    prostate cancer   . Diabetes mellitus without complication ([place])   . GERD (gastroesophageal reflux disease)   . Gout   . Gout   . Hypercholesteremia   . Hyperglycemia   . Hyperlipidemia   . Hypertension     Past Surgical History:  Procedure Laterality Date  . COLONOSCOPY    . COLONOSCOPY N/A 10/22/2013   Procedure: COLONOSCOPY;  Surgeon: Danie Binder, MD;  [facility];  Service: Endoscopy;  Laterality: N/A;  1115  . HERNIA REPAIR     right inguinal hernia repair   . NASAL SEPTUM SURGERY    . OTHER SURGICAL HISTORY     deviated septum surgery   . ROBOT ASSISTED LAPAROSCOPIC RADICAL PROSTATECTOMY  04/20/2011   Procedure: ROBOTIC ASSISTED LAPAROSCOPIC RADICAL PROSTATECTOMY;  Surgeon: Malka So, MD;  Location: [facility];  Service: Urology;  Laterality: N/A;  Bilateral lymph node dissection  . TOTAL HIP ARTHROPLASTY Right 04/20/2013   Procedure: TOTAL HIP ARTHROPLASTY;  Surgeon: Carole Civil, MD;  Location: [facility];  Service: Orthopedics;  Laterality: Right;    There were no vitals filed for this visit.      Subjective Assessment - 05/26/16 0945    Subjective Pt reports since starting PT he has noted decreased pain in the knees, particularly in intensity > frequency, with improved LE funciton after work.   Pertinent History Rt THA 2015, DM, CKD, HTN, gout, prostate cancer   Limitations Standing;Walking;Other (comment)   How long can you sit comfortably? 2/14- unlimited    How long can you stand comfortably? without limitation; about 30 minutes on 2/14   How long can you walk comfortably? Estimated about a mile on 2/14; today he reports estimated 75% improvement    Patient Stated Goals improve activity tolerance   Currently in Pain? No/denies            [facility] PT Assessment - 05/26/16 0001      Assessment   Medical Diagnosis Spinal Stenosis   Referring Provider [name]    Onset Date/Surgical Date --  several months ago   Next MD Visit Jan 2019  regular THA visit    Prior Therapy none      Strength   Right Hip Flexion 5/5   Right Hip Extension 3+/5  prone   Right Hip External Rotation  5/5   Right  Hip Internal Rotation 4/5   Right Hip ABduction 4/5  Horizontal ABD: 5/5   Right Hip ADduction 5/5   Left Hip Flexion 5/5   Left Hip Extension 4-/5  prone   Left Hip External Rotation 5/5   Left Hip Internal Rotation 5/5   Left Hip ABduction 4/5  Horizontal ABD: 5/5   Left Hip ADduction 5/5   Right Knee Flexion 5/5   Right Knee Extension 5/5   Left Knee Flexion 5/5   Left Knee Extension 5/5     Flexibility   Hamstrings Rt: 143*; Lt: 153*   Quadriceps Lt: 112; Rt: 100     Transfers   Five time sit to stand comments  5xSTS: 7.25s  11.3s at evaluation     Ambulation/Gait   Ambulation Distance (Feet) 900 Feet   Assistive device None   Gait velocity 1.68m/s  asked patient to purposefully walk slowly    Gait Comments Pain onset at  2:30, significant fatiigue in bilat glute med  worsenign control in lateralhip stabilizaers after 3 minutes     6 minute walk test results    Aerobic Endurance Distance Walked 900  4:17                             PT Education - 05/26/16 1025    Education provided Yes   Education Details Shoes need updating, show effect on stance during gait.    Person(s) Educated Patient   Methods Explanation   Comprehension Verbalized understanding          PT Short Term Goals - 05/05/16 1014      PT SHORT TERM GOAL #1   Title Pt will demo consistency and independence with his HEP to improve flexibility and activity tolerance.   Baseline 2/14- reports compliance, able to list examples of exercises he is working on    Time 2   Period Weeks   Status Achieved     PT SHORT TERM GOAL #2   Title Pt will demo proper set up and use of lumbar roll without assisatnce to improve sitting posture and lumbar mobility.   Time 2   Period Weeks   Status Achieved           PT Long Term Goals - 05/26/16 1014      PT LONG TERM GOAL #1   Title Pt will demo negative thomas test on each LE to improve standing posture and hip extensor activation during ambulation.   Baseline on 2/26 thomas test, pt has 15 degrees hip extension, >5 degrees hip extension on Rt   Time 6   Period Weeks   Status Achieved     PT LONG TERM GOAL #2   Title Pt will demo improved eccentric quad control evident by his ability to descend atleast 4, 6" steps without excessive weight shift Lt and Rt, x3 trials.    Time 6   Period Weeks   Status Achieved     PT LONG TERM GOAL #3   Title Pt will demo improved hip extensor strength to atleast 5/5 MMT to improve his ability to ambulate and ascend/descend steps without signficant difficutly or discomfort.    Baseline Hip extension is 4/5 on 2/26.    Time 6   Period Weeks   Status On-going     PT LONG TERM GOAL #4   Title Pt will perform 5x sit to stand in  less than 10 sec  without UE support and without crashing into the chair to demonstrate improved functional strength and power.    Baseline 7.19s, 8.44s, 7.62s, all on 2/26.    Period Weeks   Status Achieved     PT LONG TERM GOAL #5   Title Pt will demo improved activity tolerance and endurance evident by his ability to ambulate atleast 5 min consecutively without onset of anterior thigh pain.    Baseline Stopped at 4:15 due to fatigue in lateral hips; no onset of pain in ant thigh    Time 6   Period Weeks   Status On-going               Plan - 06/16/16 1026    Clinical Impression Statement Reassessment performed today. Pt demonstrating significant improvement in MMT strength tested, tolerance of sustained gait, functional strength AEB 5xSTS, and other areas as detailed below. The pt continues to demonstrate weakness in the gluteals in hip extension, abductions, and internal rotation, which are further evident with sustained gait and loss of hiop/trunk stability after 2-3 miuntes. Pt would continue to benefit from extension of PT skilled services for 4 more visits over 4 weeks to update HEP, DC old activitiy, and adding in advanced hip strengthening and core stabilization. Some long term goals achieved, with other showing progress.    Rehab Potential Fair   PT Frequency 2x / week   PT Duration 6 weeks   PT Treatment/Interventions ADLs/Self Care Home Management;Gait training;Stair training;Functional mobility training;Therapeutic activities;Therapeutic exercise;Patient/family education;Neuromuscular re-education;Manual techniques;Passive range of motion   PT Next Visit Plan Review HEP in full as some updates are missing from EMR and unclear at this time; Progress HEP to advanced gluteal strengthening to address funcitonal adn isolated strength deficits identified in 17-Jun-2022 visit.    PT Home Exercise Plan Needs clarification and updates; EMR inacurate.    Consulted and Agree with Plan of Care  Patient      Patient will benefit from skilled therapeutic intervention in order to improve the following deficits and impairments:  Abnormal gait, Decreased activity tolerance, Decreased endurance, Decreased range of motion, Decreased strength, Impaired flexibility, Postural dysfunction, Pain, Difficulty walking, Increased muscle spasms  Visit Diagnosis: Other abnormalities of gait and mobility  Muscle weakness (generalized)  Pain in right thigh  Pain in left thigh       G-Codes - 06-16-2016 1031    Functional Assessment Tool Used (Outpatient Only) Clinical Judgment    Functional Limitation Mobility: Walking and moving around   Mobility: Walking and Moving Around Current Status [phone]) At least 20 percent but less than 40 percent impaired, limited or restricted   Mobility: Walking and Moving Around Goal Status [phone]) At least 1 percent but less than 20 percent impaired, limited or restricted      Problem List Patient Active Problem List   Diagnosis Date Noted  . HTN (hypertension) 12/10/2013  . Status post THR (total hip replacement) 05/01/2013  . Arthritis of right hip 04/20/2013  . Osteoarthritis of right hip 03/29/2013  . Prediabetes 03/26/2013  . Hyperlipidemia 03/26/2013  . Microproteinuria 03/26/2013  . Night sweats 03/26/2013  . Gout 03/26/2013  . Office hypertension 03/09/2013  . Osteoarthritis of right knee 11/23/2012  . Osteoarthritis of hip 09/05/2012  . Back pain 09/05/2012  . Prostate cancer ([place]) 04/20/2011    10:33 AM, 06/16/2016 Etta Grandchild, PT, DPT Physical Therapist at [facility] [phone] (office)     [place], [place], [address] Phone: [phone]   Fax:  [phone]  Name: [name] [name] MRN: [id number] Date of Birth: 06/06/1950

## 2016-05-28 ENCOUNTER — Ambulatory Visit (HOSPITAL_COMMUNITY): Payer: Commercial Managed Care - HMO

## 2016-06-02 ENCOUNTER — Ambulatory Visit (HOSPITAL_COMMUNITY): Payer: Commercial Managed Care - HMO

## 2016-06-02 DIAGNOSIS — M6281 Muscle weakness (generalized): Secondary | ICD-10-CM | POA: Diagnosis not present

## 2016-06-02 DIAGNOSIS — M79652 Pain in left thigh: Secondary | ICD-10-CM

## 2016-06-02 DIAGNOSIS — R2689 Other abnormalities of gait and mobility: Secondary | ICD-10-CM | POA: Diagnosis not present

## 2016-06-02 DIAGNOSIS — M79651 Pain in right thigh: Secondary | ICD-10-CM | POA: Diagnosis not present

## 2016-06-02 NOTE — Therapy (Signed)
[place], [place], [address] Phone: [phone]   Fax:  [phone]  Physical Therapy Treatment  Patient Details  Name: [name] [name] MRN: [id number] Date of Birth: 31-Aug-1950 Referring Provider: [name]   Encounter Date: 06/02/2016      PT End of Session - 06/02/16 0958    Visit Number 11   Number of Visits 16   Date for PT Re-Evaluation 06/26/16   Authorization Type [employer] (G-codes done 6th session)   Authorization Time Period 04/14/16 to 05/26/16; 05/26/16-06/26/16   Authorization - Visit Number 11   Authorization - Number of Visits 16   PT Start Time 0951   PT Stop Time 6962   PT Time Calculation (min) 38 min   Activity Tolerance Patient tolerated treatment well;No increased pain   Behavior During Therapy WFL for tasks assessed/performed      Past Medical History:  Diagnosis Date  . Arthritis    right knee and hip   . Cancer [facility])    prostate cancer   . Chronic kidney disease    prostate cancer   . Diabetes mellitus without complication ([place])   . GERD (gastroesophageal reflux disease)   . Gout   . Gout   . Hypercholesteremia   . Hyperglycemia   . Hyperlipidemia   . Hypertension     Past Surgical History:  Procedure Laterality Date  . COLONOSCOPY    . COLONOSCOPY N/A 10/22/2013   Procedure: COLONOSCOPY;  Surgeon: Danie Binder, MD;  [facility];  Service: Endoscopy;  Laterality: N/A;  1115  . HERNIA REPAIR     right inguinal hernia repair   . NASAL SEPTUM SURGERY    . OTHER SURGICAL HISTORY     deviated septum surgery   . ROBOT ASSISTED LAPAROSCOPIC RADICAL PROSTATECTOMY  04/20/2011   Procedure: ROBOTIC ASSISTED LAPAROSCOPIC RADICAL PROSTATECTOMY;  Surgeon: Malka So, MD;  Location: [facility];  Service: Urology;  Laterality: N/A;  Bilateral lymph node dissection  . TOTAL HIP ARTHROPLASTY Right 04/20/2013   Procedure: TOTAL HIP ARTHROPLASTY;  Surgeon: Carole Civil, MD;  Location: [facility];  Service: Orthopedics;  Laterality: Right;    There were no vitals filed for this visit.      Subjective Assessment - 06/02/16 0953    Subjective Pt doign better today. He got new work shoes and has notice improved stability on his left foot at work.    Pertinent History Rt THA 2015, DM, CKD, HTN, gout, prostate cancer   Currently in Pain? No/denies                         [facility]Exercise - 06/02/16 0001      Lumbar Exercises: Stretches   Passive Hamstring Stretch 3 reps;30 seconds  Right only, on step   Hip Flexor Stretch Limitations prone quads stretch 3x30sec c rope:     Lumbar Exercises: Standing   Shoulder Adduction Limitations retro gait c 2lb cuff weights: 2x54ft  lateral side steps c 2lb cuffs: 2x4ft bilat   Other Standing Lumbar Exercises SLS marching, 5sec hold: 10x bilat  high step marching c 2lb cuff weights 2x6ft   Other Standing Lumbar Exercises lateral step up, 6 inch step: 2x10 bilat 0HHA  forward step up, 6 inch step: 2x10 bilat 0HHA                  PT Short  Term Goals - 05/05/16 1014      PT SHORT TERM GOAL #1   Title Pt will demo consistency and independence with his HEP to improve flexibility and activity tolerance.   Baseline 2/14- reports compliance, able to list examples of exercises he is working on    Time 2   Period Weeks   Status Achieved     PT SHORT TERM GOAL #2   Title Pt will demo proper set up and use of lumbar roll without assisatnce to improve sitting posture and lumbar mobility.   Time 2   Period Weeks   Status Achieved           PT Long Term Goals - 05/26/16 1014      PT LONG TERM GOAL #1   Title Pt will demo negative thomas test on each LE to improve standing posture and hip extensor activation during ambulation.   Baseline on 2/26 thomas test, pt has 15 degrees hip extension, >5 degrees hip extension on Rt   Time 6   Period Weeks   Status  Achieved     PT LONG TERM GOAL #2   Title Pt will demo improved eccentric quad control evident by his ability to descend atleast 4, 6" steps without excessive weight shift Lt and Rt, x3 trials.    Time 6   Period Weeks   Status Achieved     PT LONG TERM GOAL #3   Title Pt will demo improved hip extensor strength to atleast 5/5 MMT to improve his ability to ambulate and ascend/descend steps without signficant difficutly or discomfort.    Baseline Hip extension is 4/5 on 2/26.    Time 6   Period Weeks   Status On-going     PT LONG TERM GOAL #4   Title Pt will perform 5x sit to stand in less than 10 sec without UE support and without crashing into the chair to demonstrate improved functional strength and power.    Baseline 7.19s, 8.44s, 7.62s, all on 2/26.    Period Weeks   Status Achieved     PT LONG TERM GOAL #5   Title Pt will demo improved activity tolerance and endurance evident by his ability to ambulate atleast 5 min consecutively without onset of anterior thigh pain.    Baseline Stopped at 4:15 due to fatigue in lateral hips; no onset of pain in ant thigh    Time 6   Period Weeks   Status On-going               Plan - 06/02/16 0959    Clinical Impression Statement Progressed functional gait, hip strengthening, and balance this session. Continued with Rt hamstring and Left quads stretches. Pt makng progress towatd goals overall. Demonstrates improve tolerance with gait training with weights.    Rehab Potential Good   PT Frequency 2x / week   PT Duration 6 weeks   PT Treatment/Interventions ADLs/Self Care Home Management;Gait training;Stair training;Functional mobility training;Therapeutic activities;Therapeutic exercise;Patient/family education;Neuromuscular re-education;Manual techniques;Passive range of motion   PT Next Visit Plan Review HEP in full as some updates are missing from EMR and unclear at this time; Progress HEP to advanced gluteal strengthening to address  funcitonal adn isolated strength deficits identified in 3/7 visit.    Consulted and Agree with Plan of Care Patient      Patient will benefit from skilled therapeutic intervention in order to improve the following deficits and impairments:  Abnormal gait, Decreased activity tolerance, Decreased endurance,  Decreased range of motion, Decreased strength, Impaired flexibility, Postural dysfunction, Pain, Difficulty walking, Increased muscle spasms  Visit Diagnosis: Other abnormalities of gait and mobility  Muscle weakness (generalized)  Pain in right thigh  Pain in left thigh     Problem List Patient Active Problem List   Diagnosis Date Noted  . HTN (hypertension) 12/10/2013  . Status post THR (total hip replacement) 05/01/2013  . Arthritis of right hip 04/20/2013  . Osteoarthritis of right hip 03/29/2013  . Prediabetes 03/26/2013  . Hyperlipidemia 03/26/2013  . Microproteinuria 03/26/2013  . Night sweats 03/26/2013  . Gout 03/26/2013  . Office hypertension 03/09/2013  . Osteoarthritis of right knee 11/23/2012  . Osteoarthritis of hip 09/05/2012  . Back pain 09/05/2012  . Prostate cancer ([place]) 04/20/2011    10:11 AM, 06/02/16 Etta Grandchild, PT, DPT Physical Therapist at [facility] [phone] (office)     [place], [place], [address] Phone: [phone]   Fax:  [phone]  Name: [name] [name] MRN: [id number] Date of Birth: 05-20-1950

## 2016-06-04 ENCOUNTER — Encounter (HOSPITAL_COMMUNITY): Payer: Commercial Managed Care - HMO

## 2016-06-09 ENCOUNTER — Ambulatory Visit (HOSPITAL_COMMUNITY): Payer: Commercial Managed Care - HMO

## 2016-06-09 DIAGNOSIS — M79651 Pain in right thigh: Secondary | ICD-10-CM | POA: Diagnosis not present

## 2016-06-09 DIAGNOSIS — M6281 Muscle weakness (generalized): Secondary | ICD-10-CM | POA: Diagnosis not present

## 2016-06-09 DIAGNOSIS — R2689 Other abnormalities of gait and mobility: Secondary | ICD-10-CM

## 2016-06-09 DIAGNOSIS — M79652 Pain in left thigh: Secondary | ICD-10-CM | POA: Diagnosis not present

## 2016-06-09 DIAGNOSIS — G4733 Obstructive sleep apnea (adult) (pediatric): Secondary | ICD-10-CM | POA: Diagnosis not present

## 2016-06-09 DIAGNOSIS — E785 Hyperlipidemia, unspecified: Secondary | ICD-10-CM | POA: Diagnosis not present

## 2016-06-09 DIAGNOSIS — I10 Essential (primary) hypertension: Secondary | ICD-10-CM | POA: Diagnosis not present

## 2016-06-09 DIAGNOSIS — R0683 Snoring: Secondary | ICD-10-CM | POA: Diagnosis not present

## 2016-06-09 NOTE — Therapy (Signed)
[place], [place], [address] Phone: [phone]   Fax:  [phone]  Physical Therapy Treatment  Patient Details  Name: [name] [name] MRN: [id number] Date of Birth: [birth year] Referring Provider: [name]   Encounter Date: 06/09/2016      PT End of Session - 06/09/16 1047    Visit Number 12   Number of Visits 16   Date for PT Re-Evaluation 06/26/16   Authorization Type [employer] (G-codes done 6th session)   Authorization Time Period 04/14/16 to 05/26/16; 05/26/16-06/26/16   Authorization - Visit Number 12   Authorization - Number of Visits 16   PT Start Time 0949   PT Stop Time 5597   PT Time Calculation (min) 45 min   Activity Tolerance Patient tolerated treatment well;No increased pain;Patient limited by pain;Patient limited by fatigue   Behavior During Therapy [facility] for tasks assessed/performed      Past Medical History:  Diagnosis Date  . Arthritis    right knee and hip   . Cancer [facility])    prostate cancer   . Chronic kidney disease    prostate cancer   . Diabetes mellitus without complication ([place])   . GERD (gastroesophageal reflux disease)   . Gout   . Gout   . Hypercholesteremia   . Hyperglycemia   . Hyperlipidemia   . Hypertension     Past Surgical History:  Procedure Laterality Date  . COLONOSCOPY    . COLONOSCOPY N/A 10/22/2013   Procedure: COLONOSCOPY;  Surgeon: Danie Binder, MD;  [facility];  Service: Endoscopy;  Laterality: N/A;  1115  . HERNIA REPAIR     right inguinal hernia repair   . NASAL SEPTUM SURGERY    . OTHER SURGICAL HISTORY     deviated septum surgery   . ROBOT ASSISTED LAPAROSCOPIC RADICAL PROSTATECTOMY  04/20/2011   Procedure: ROBOTIC ASSISTED LAPAROSCOPIC RADICAL PROSTATECTOMY;  Surgeon: Malka So, MD;  Location[facility];  Service: Urology;  Laterality: N/A;  Bilateral lymph node dissection  . TOTAL HIP ARTHROPLASTY Right 04/20/2013   Procedure: TOTAL HIP ARTHROPLASTY;  Surgeon: Carole Civil, MD;  Location: [facility];  Service: Orthopedics;  Laterality: Right;    There were no vitals filed for this visit.      Subjective Assessment - 06/09/16 1037    Subjective Pt says his work shoes are still working out well. He reports there are no major updates since last visit.    Pertinent History Rt THA 2015, DM, CKD, HTN, gout, prostate cancer   Currently in Pain? No/denies                         [facility] Adult PT Treatment/Exercise - 06/09/16 0001      Ambulation/Gait   Ambulation Distance (Feet) --  646ft; 953ft   Assistive device None   Gait Pattern Decreased arm swing - right;Decreased arm swing - left;Trendelenburg;Scissoring  T-burg Rt; apparent LLD on Rt   Gait velocity 1.44m/s   Gait Comments sudden onset of anterolateral hip pain at minute 3 first trial; second trial limited by general fatigue, DOE, no pain   verbal cues for increased toe rocker at toe-off;      Lumbar Exercises: Seated   Long Arc Quad on Chair 1 set;Both;10 reps  after session     Modalities   Modalities Moist Heat     Moist Heat Therapy   Number Minutes  Moist Heat 20 Minutes  performed to quads contrlateral to treatment limb   Moist Heat Location Hip     Manual Therapy   Manual Therapy Myofascial release;Passive ROM   Manual therapy comments IASTM to distal/lateral quads: 5 miuntes total   several taut bands throughout, more TFL on Left and VL onRt   Myofascial Release MFR: active release techniques to bilat vastus lateralis/rectus femoris/ TFL   14 minutes total    Passive ROM Prone knee flexion stretch: 3x30sec bilat                  PT Short Term Goals - 05/05/16 1014      PT SHORT TERM GOAL #1   Title Pt will demo consistency and independence with his HEP to improve flexibility and activity tolerance.   Baseline 2/14- reports compliance, able to list examples of exercises he is working on     Time 2   Period Weeks   Status Achieved     PT SHORT TERM GOAL #2   Title Pt will demo proper set up and use of lumbar roll without assisatnce to improve sitting posture and lumbar mobility.   Time 2   Period Weeks   Status Achieved           PT Long Term Goals - 05/26/16 1014      PT LONG TERM GOAL #1   Title Pt will demo negative thomas test on each LE to improve standing posture and hip extensor activation during ambulation.   Baseline on 2/26 thomas test, pt has 15 degrees hip extension, >5 degrees hip extension on Rt   Time 6   Period Weeks   Status Achieved     PT LONG TERM GOAL #2   Title Pt will demo improved eccentric quad control evident by his ability to descend atleast 4, 6" steps without excessive weight shift Lt and Rt, x3 trials.    Time 6   Period Weeks   Status Achieved     PT LONG TERM GOAL #3   Title Pt will demo improved hip extensor strength to atleast 5/5 MMT to improve his ability to ambulate and ascend/descend steps without signficant difficutly or discomfort.    Baseline Hip extension is 4/5 on 2/26.    Time 6   Period Weeks   Status On-going     PT LONG TERM GOAL #4   Title Pt will perform 5x sit to stand in less than 10 sec without UE support and without crashing into the chair to demonstrate improved functional strength and power.    Baseline 7.19s, 8.44s, 7.62s, all on 2/26.    Period Weeks   Status Achieved     PT LONG TERM GOAL #5   Title Pt will demo improved activity tolerance and endurance evident by his ability to ambulate atleast 5 min consecutively without onset of anterior thigh pain.    Baseline Stopped at 4:15 due to fatigue in lateral hips; no onset of pain in ant thigh    Time 6   Period Weeks   Status On-going               Plan - 06/09/16 1048    Clinical Impression Statement Pt continues to progress towards improved activity tolerance. Today was the first session where th epatient was limited in prolonged gait  due to general fatgiue of cardiopulmonary nature v pain in legs. Onset of pain with first gait trial is targeted more towards anterolateral  hips. palpation reveals supple glutemed without pain. Extensive manual is directed toward the lateral quads and TFL, and post manual therapy gait is pain free and "loose" feeling. The patient is cued to increase plantarflexion in gait, which is difficult to see visually, but immediately decreases loudness of flat foot strike. There is also a notable improvement in control of lumbarpelvis and higher degree of consistency with Rt foot placement and decrease in scissoring. Pt is able to complete 4+ minutes pain free. No changes to POC at this time. Will continue strengthening of hips, gait control, and STM to address continued myofascial adhesions and taut bands in thighs.    Rehab Potential Good   PT Frequency 2x / week   PT Duration 6 weeks   PT Treatment/Interventions ADLs/Self Care Home Management;Gait training;Stair training;Functional mobility training;Therapeutic activities;Therapeutic exercise;Patient/family education;Neuromuscular re-education;Manual techniques;Passive range of motion   PT Next Visit Plan Review HEP in full as some updates are missing from EMR and unclear at this time; Progress HEP to advanced gluteal strengthening to address funcitonal adn isolated strength deficits identified in 3/7 visit.    PT Home Exercise Plan Needs review and update next session. 2 visits remaining prior to DC.    Consulted and Agree with Plan of Care Patient      Patient will benefit from skilled therapeutic intervention in order to improve the following deficits and impairments:  Abnormal gait, Decreased activity tolerance, Decreased endurance, Decreased range of motion, Decreased strength, Impaired flexibility, Postural dysfunction, Pain, Difficulty walking, Increased muscle spasms  Visit Diagnosis: Other abnormalities of gait and mobility  Muscle weakness  (generalized)  Pain in right thigh  Pain in left thigh     Problem List Patient Active Problem List   Diagnosis Date Noted  . HTN (hypertension) 12/10/2013  . Status post THR (total hip replacement) 05/01/2013  . Arthritis of right hip 04/20/2013  . Osteoarthritis of right hip 03/29/2013  . Prediabetes 03/26/2013  . Hyperlipidemia 03/26/2013  . Microproteinuria 03/26/2013  . Night sweats 03/26/2013  . Gout 03/26/2013  . Office hypertension 03/09/2013  . Osteoarthritis of right knee 11/23/2012  . Osteoarthritis of hip 09/05/2012  . Back pain 09/05/2012  . Prostate cancer ([place]) 04/20/2011    11:00 AM, 06/09/16 Etta Grandchild, PT, DPT Physical Therapist at [place] [phone] (office)     [place], [place], [address] Phone: [phone]   Fax:  [phone]  Name: [name] [name] MRN: [id number] Date of Birth: 21-Sep-1950

## 2016-06-10 ENCOUNTER — Encounter (HOSPITAL_COMMUNITY): Payer: Commercial Managed Care - HMO | Admitting: Physical Therapy

## 2016-06-11 ENCOUNTER — Encounter: Payer: Self-pay | Admitting: Family Medicine

## 2016-06-11 ENCOUNTER — Ambulatory Visit (INDEPENDENT_AMBULATORY_CARE_PROVIDER_SITE_OTHER): Payer: Medicare HMO | Admitting: Family Medicine

## 2016-06-11 VITALS — BP 128/82 | Ht 66.5 in | Wt 217.6 lb

## 2016-06-11 DIAGNOSIS — F320 Major depressive disorder, single episode, mild: Secondary | ICD-10-CM

## 2016-06-11 MED ORDER — CITALOPRAM HYDROBROMIDE 20 MG PO TABS: 20.0000 mg | ORAL_TABLET | Freq: Every day | ORAL | 1 refills | Status: DC

## 2016-06-11 NOTE — Patient Instructions (Signed)
Send me a update in 3 to 4 weeks I am happy to see you anytime

## 2016-06-11 NOTE — Progress Notes (Signed)
   Subjective:    Patient ID: [name] [name], male    DOB: 1951-01-19, 66 y.o.   MRN: [id number]  HPI Patient arrives to discuss anxiety and nerves. Patient started to have some symptoms of depression at times feels a little bit down a little blue and a little anxious. Denies being suicidal homicidal. Denies panic attacks. Denies excessive worry. States energy level slightly subpar. He is had this before in the winter time and was treated with Celexa and he did well he would like to try that medication again. He does not feel counseling would be of any help he gets proper rest he is working trying to exercise some and trying to eat right  Review of Systems     Objective:   Physical Exam  15-20 minutes spent with patient discussing his current issues and discussing treatment rationale and answering questions      Assessment & Plan:  Mild depression not severe should gradually get better with medication this patient has been here before with this type of issue responded well to Celexa he is not suicidal or homicidal. I told the patient that I would be perfectly fine with starting the medicine a half tablet a day for the first week then 1 tablet daily thereafter he is to give [place] follow-up in 3-4 weeks how he is doing if he is doing well as we discussed today he would like to stick with this medicine for 3-4 months then to taper off. He has a follow-up visit later this year. I agree with the patient that he does not necessarily need to follow-up in the next few weeks as long as he is willing to call me and give me a adequate update of how he is doing  Patient was told that if he gets worse or more depressing must come back right away he was also warned that if he starts feeling suicidal he needs to be seen right away here or ER

## 2016-06-16 ENCOUNTER — Ambulatory Visit (HOSPITAL_COMMUNITY): Payer: Commercial Managed Care - HMO

## 2016-06-16 ENCOUNTER — Telehealth (HOSPITAL_COMMUNITY): Payer: Self-pay | Admitting: Family Medicine

## 2016-06-16 NOTE — Telephone Encounter (Signed)
06/16/16 I left a message on his cell # that we needed to cx today's appt since the therapist was out sick.  I offered him an appt for Friday if he was interested or we could add today to the end of his schedule if he would call back and let us know what he would like to do.

## 2016-06-23 ENCOUNTER — Encounter (HOSPITAL_COMMUNITY): Payer: Self-pay

## 2016-06-23 ENCOUNTER — Ambulatory Visit (HOSPITAL_COMMUNITY): Payer: Commercial Managed Care - HMO | Attending: Orthopedic Surgery

## 2016-06-23 DIAGNOSIS — M6281 Muscle weakness (generalized): Secondary | ICD-10-CM | POA: Diagnosis not present

## 2016-06-23 DIAGNOSIS — R2689 Other abnormalities of gait and mobility: Secondary | ICD-10-CM | POA: Diagnosis not present

## 2016-06-23 DIAGNOSIS — M79652 Pain in left thigh: Secondary | ICD-10-CM | POA: Diagnosis not present

## 2016-06-23 DIAGNOSIS — M79651 Pain in right thigh: Secondary | ICD-10-CM | POA: Diagnosis not present

## 2016-06-23 NOTE — Patient Instructions (Signed)
  Clamshell with Loop  Place loop around thighs just above the knees. Lie on your side with knees bent. Lift top knee toward ceiling, keeping hips stacked.  Perform 3 sets of 10 -- progress by holding for 5-10 seconds at the top. Start with green band and progress to blue band.   LOOPED ELASTIC BAND HIP ABDUCTION  While standing with an elastic band looped around your ankles, move the target leg out to the side as shown.  Perform 3 sets of 10 -- progress by holding for 5-10 seconds at the top. Start with green band and progress to blue band.   ELASTIC BAND LATERAL WALKS - PROXIMAL  With an elastic band around your thighs, take steps to the side while keeping your feet spread apart. Keep your knees slightly bent the entire time.  Perform 3 sets of 10-20 steps each way. Start with green band and progress to blue band.   BRIDGING ELASTIC BAND ABDUCTION  While lying on your back, place an elastic band around your knees and pull your knees apart. Hold this and then tighten your lower abdominals, squeeze your buttocks and raise your buttocks off the floor/bed as creating a "Bridge" with your body.  Perform 3 sets of 10 -- start with no band and then progress to using the bands (green then blue) -- can also progress by holding for 5-10 seconds at the top.

## 2016-06-23 NOTE — Therapy (Signed)
[place], [place], [address] Phone: [phone]   Fax:  [phone]  Physical Therapy Treatment/Discharge Summary  Patient Details  Name: [name] [name] MRN: [id number] Date of Birth: Aug 16, 1950 Referring Provider: [name]   Encounter Date: 06/23/2016      PT End of Session - 06/23/16 0950    Visit Number 13   Number of Visits 16   Date for PT Re-Evaluation 06/26/16   Authorization Type [employer] (G-codes done 6th session)   Authorization Time Period 04/14/16 to 05/26/16; 05/26/16-06/26/16   Authorization - Visit Number 13   Authorization - Number of Visits 16   PT Start Time 0950   PT Stop Time 1018   PT Time Calculation (min) 28 min   Activity Tolerance Patient tolerated treatment well;No increased pain;Patient limited by pain;Patient limited by fatigue   Behavior During Therapy [facility] for tasks assessed/performed      Past Medical History:  Diagnosis Date  . Arthritis    right knee and hip   . Cancer [facility])    prostate cancer   . Chronic kidney disease    prostate cancer   . Diabetes mellitus without complication ([place])   . GERD (gastroesophageal reflux disease)   . Gout   . Gout   . Hypercholesteremia   . Hyperglycemia   . Hyperlipidemia   . Hypertension     Past Surgical History:  Procedure Laterality Date  . COLONOSCOPY    . COLONOSCOPY N/A 10/22/2013   Procedure: COLONOSCOPY;  Surgeon: Danie Binder, MD;  Location: [facility] SU[facility];  Service: Endoscopy;  Laterality: N/A;  1115  . HERNIA REPAIR     right inguinal hernia repair   . NASAL SEPTUM SURGERY    . OTHER SURGICAL HISTORY     deviated septum surgery   . ROBOT ASSISTED LAPAROSCOPIC RADICAL PROSTATECTOMY  04/20/2011   Procedure: ROBOTIC ASSISTED LAPAROSCOPIC RADICAL PROSTATECTOMY;  Surgeon: Malka So, MD;  Location: [facility] ORS;  Service: Urology;  Laterality: N/A;  Bilateral lymph node dissection  . TOTAL HIP ARTHROPLASTY Right  04/20/2013   Procedure: TOTAL HIP ARTHROPLASTY;  Surgeon: Carole Civil, MD;  Location: [facility] ORS;  Service: Orthopedics;  Laterality: Right;    There were no vitals filed for this visit.      Subjective Assessment - 06/23/16 0951    Subjective Pt states that he feels pretty good today. He denies any pain.   Pertinent History Rt THA 2015, DM, CKD, HTN, gout, prostate cancer   Currently in Pain? No/denies            [facility] PT Assessment - 06/23/16 0001      Strength   Right Hip Flexion 5/5   Right Hip Extension 5/5   Right Hip External Rotation  5/5   Right Hip Internal Rotation 5/5   Right Hip ABduction 4+/5   Left Hip Flexion 5/5   Left Hip Extension 5/5   Left Hip External Rotation 5/5   Left Hip Internal Rotation 5/5   Left Hip ABduction 5/5   Right Knee Flexion 5/5   Right Knee Extension 5/5   Left Knee Flexion 5/5   Left Knee Extension 5/5     Flexibility   Hamstrings R: 152 deg; L: 160 deg  90/90           PT Education - 06/23/16 1019    Education provided Yes   Education Details discharge plans, updated HEP to include  hip strengthening since pt continues with trendelenberg, can come back with referral if he notices a decline in his function over the next few months.   Person(s) Educated Patient   Methods Explanation;Demonstration;Handout   Comprehension Verbalized understanding;Returned demonstration          PT Short Term Goals - 05/05/16 1014      PT SHORT TERM GOAL #1   Title Pt will demo consistency and independence with his HEP to improve flexibility and activity tolerance.   Baseline 2/14- reports compliance, able to list examples of exercises he is working on    Time 2   Period Weeks   Status Achieved     PT SHORT TERM GOAL #2   Title Pt will demo proper set up and use of lumbar roll without assisatnce to improve sitting posture and lumbar mobility.   Time 2   Period Weeks   Status Achieved           PT Long Term Goals -  06/23/16 0954      PT LONG TERM GOAL #1   Title Pt will demo negative thomas test on each LE to improve standing posture and hip extensor activation during ambulation.   Baseline on 2/26 thomas test, pt has 15 degrees hip extension, >5 degrees hip extension on Rt   Time 6   Period Weeks   Status Achieved     PT LONG TERM GOAL #2   Title Pt will demo improved eccentric quad control evident by his ability to descend atleast 4, 6" steps without excessive weight shift Lt and Rt, x3 trials.    Time 6   Period Weeks   Status Achieved     PT LONG TERM GOAL #3   Title Pt will demo improved hip extensor strength to atleast 5/5 MMT to improve his ability to ambulate and ascend/descend steps without signficant difficutly or discomfort.    Baseline 4/4: R hip abd 4+/5   Time 6   Period Weeks   Status Partially Met     PT LONG TERM GOAL #4   Title Pt will perform 5x sit to stand in less than 10 sec without UE support and without crashing into the chair to demonstrate improved functional strength and power.    Baseline 7.19s, 8.44s, 7.62s, all on 2/26.    Period Weeks   Status Achieved     PT LONG TERM GOAL #5   Title Pt will demo improved activity tolerance and endurance evident by his ability to ambulate atleast 5 min consecutively without onset of anterior thigh pain.    Baseline 4/4: able to ambulate 6:30 with no onset of anterior thigh pain   Time 6   Period Weeks   Status Achieved               Plan - 06/23/16 1020    Clinical Impression Statement PT reassessed pt's outcome measures and goals this date. Pt has partially met 1 LTG while meeting all others. He is still slightly limited in his R hip abd strength, but has overall made significant progress since beginning therapy. Pt reports feeling 75-80% improved since starting, stating that the remaining 20-25% is due to his BLE fatigue that he will feel if he over does it. Pt able to ambulate for 6:30 without any onset of anterior  thigh pain, which is a significant improvement from previous assessments. Due to proress, pt will be discharged to his HEP. He was provided progressed strengthening exercises for  his hips to promote decreased trendelenberg with gait and improved overall hip abd strength. Pt educated that he can return to PT with referral if he notices a significant decline in his function and he verbalized understanding.   Rehab Potential Good   PT Frequency 2x / week   PT Duration 6 weeks   PT Treatment/Interventions ADLs/Self Care Home Management;Gait training;Stair training;Functional mobility training;Therapeutic activities;Therapeutic exercise;Patient/family education;Neuromuscular re-education;Manual techniques;Passive range of motion   PT Next Visit Plan Review HEP in full as some updates are missing from EMR and unclear at this time; Progress HEP to advanced gluteal strengthening to address funcitonal adn isolated strength deficits identified in 3/7 visit.    PT Home Exercise Plan Needs review and update next session. 2 visits remaining prior to DC.; 2016/07/14: continue current HEP and added sidelying clamshells, standing hip abd, resisted side stepping, bridging with band (progressing from GTB > BTB)   Consulted and Agree with Plan of Care Patient      Patient will benefit from skilled therapeutic intervention in order to improve the following deficits and impairments:  Abnormal gait, Decreased activity tolerance, Decreased endurance, Decreased range of motion, Decreased strength, Impaired flexibility, Postural dysfunction, Pain, Difficulty walking, Increased muscle spasms  Visit Diagnosis: Other abnormalities of gait and mobility  Muscle weakness (generalized)  Pain in right thigh  Pain in left thigh       G-Codes - 07/14/16 1025    Functional Assessment Tool Used (Outpatient Only) Clinical Judgment    Functional Limitation Mobility: Walking and moving around   Mobility: Walking and Moving Around  Current Status [phone]) At least 20 percent but less than 40 percent impaired, limited or restricted   Mobility: Walking and Moving Around Goal Status [phone]) At least 1 percent but less than 20 percent impaired, limited or restricted   Mobility: Walking and Moving Around Discharge Status [phone]) At least 20 percent but less than 40 percent impaired, limited or restricted      Problem List Patient Active Problem List   Diagnosis Date Noted  . HTN (hypertension) 12/10/2013  . Status post THR (total hip replacement) 05/01/2013  . Arthritis of right hip 04/20/2013  . Osteoarthritis of right hip 03/29/2013  . Prediabetes 03/26/2013  . Hyperlipidemia 03/26/2013  . Microproteinuria 03/26/2013  . Night sweats 03/26/2013  . Gout 03/26/2013  . Office hypertension 03/09/2013  . Osteoarthritis of right knee 11/23/2012  . Osteoarthritis of hip 09/05/2012  . Back pain 09/05/2012  . Prostate cancer ([place]) 04/20/2011     Geraldine Solar PT, [place] [address] [place], [place], [address] Phone: [phone]   Fax:  [phone]  Name: BRYDEN DARDEN MRN: [id number] Date of Birth: [birth year]

## 2016-07-01 ENCOUNTER — Ambulatory Visit (HOSPITAL_COMMUNITY): Payer: Commercial Managed Care - HMO | Admitting: Physical Therapy

## 2016-07-10 DIAGNOSIS — R0683 Snoring: Secondary | ICD-10-CM | POA: Diagnosis not present

## 2016-07-10 DIAGNOSIS — G4733 Obstructive sleep apnea (adult) (pediatric): Secondary | ICD-10-CM | POA: Diagnosis not present

## 2016-07-10 DIAGNOSIS — E785 Hyperlipidemia, unspecified: Secondary | ICD-10-CM | POA: Diagnosis not present

## 2016-07-10 DIAGNOSIS — I10 Essential (primary) hypertension: Secondary | ICD-10-CM | POA: Diagnosis not present

## 2016-07-16 DIAGNOSIS — G4733 Obstructive sleep apnea (adult) (pediatric): Secondary | ICD-10-CM | POA: Diagnosis not present

## 2016-08-09 DIAGNOSIS — R0683 Snoring: Secondary | ICD-10-CM | POA: Diagnosis not present

## 2016-08-09 DIAGNOSIS — I10 Essential (primary) hypertension: Secondary | ICD-10-CM | POA: Diagnosis not present

## 2016-08-09 DIAGNOSIS — E785 Hyperlipidemia, unspecified: Secondary | ICD-10-CM | POA: Diagnosis not present

## 2016-08-09 DIAGNOSIS — G4733 Obstructive sleep apnea (adult) (pediatric): Secondary | ICD-10-CM | POA: Diagnosis not present

## 2016-08-27 DIAGNOSIS — E291 Testicular hypofunction: Secondary | ICD-10-CM | POA: Diagnosis not present

## 2016-08-27 DIAGNOSIS — Z8546 Personal history of malignant neoplasm of prostate: Secondary | ICD-10-CM | POA: Diagnosis not present

## 2016-09-03 ENCOUNTER — Ambulatory Visit (INDEPENDENT_AMBULATORY_CARE_PROVIDER_SITE_OTHER): Payer: Medicare HMO | Admitting: Urology

## 2016-09-03 DIAGNOSIS — R800 Isolated proteinuria: Secondary | ICD-10-CM | POA: Diagnosis not present

## 2016-09-03 DIAGNOSIS — N5231 Erectile dysfunction following radical prostatectomy: Secondary | ICD-10-CM | POA: Diagnosis not present

## 2016-09-03 DIAGNOSIS — E291 Testicular hypofunction: Secondary | ICD-10-CM | POA: Diagnosis not present

## 2016-09-03 DIAGNOSIS — Z8546 Personal history of malignant neoplasm of prostate: Secondary | ICD-10-CM

## 2016-09-09 DIAGNOSIS — I10 Essential (primary) hypertension: Secondary | ICD-10-CM | POA: Diagnosis not present

## 2016-09-09 DIAGNOSIS — R0683 Snoring: Secondary | ICD-10-CM | POA: Diagnosis not present

## 2016-09-09 DIAGNOSIS — G4733 Obstructive sleep apnea (adult) (pediatric): Secondary | ICD-10-CM | POA: Diagnosis not present

## 2016-09-09 DIAGNOSIS — E785 Hyperlipidemia, unspecified: Secondary | ICD-10-CM | POA: Diagnosis not present

## 2016-10-05 ENCOUNTER — Telehealth: Payer: Self-pay | Admitting: Family Medicine

## 2016-10-05 DIAGNOSIS — M1A9XX0 Chronic gout, unspecified, without tophus (tophi): Secondary | ICD-10-CM

## 2016-10-05 DIAGNOSIS — R7303 Prediabetes: Secondary | ICD-10-CM

## 2016-10-05 DIAGNOSIS — R5383 Other fatigue: Secondary | ICD-10-CM

## 2016-10-05 DIAGNOSIS — E785 Hyperlipidemia, unspecified: Secondary | ICD-10-CM

## 2016-10-05 DIAGNOSIS — I10 Essential (primary) hypertension: Secondary | ICD-10-CM

## 2016-10-05 NOTE — Telephone Encounter (Signed)
Lipid, liver, metabolic 7, CBC-fatigue hyperlipidemia elevated creatinine, uric acid also-gout

## 2016-10-05 NOTE — Telephone Encounter (Signed)
Last labs jan 2018- lipid, liver, met 7 and hgba1c

## 2016-10-05 NOTE — Telephone Encounter (Signed)
Pt is requesting lab orders to be sent over for an upcoming visit.  Pt is also wanting to go ahead and get a chest xray ordered for the gurgling in his chest.

## 2016-10-06 NOTE — Telephone Encounter (Signed)
Blood work ordered in EPIC. Patient notified. 

## 2016-10-19 ENCOUNTER — Encounter: Payer: Self-pay | Admitting: Family Medicine

## 2016-10-19 ENCOUNTER — Other Ambulatory Visit: Payer: Self-pay | Admitting: *Deleted

## 2016-10-19 ENCOUNTER — Ambulatory Visit (HOSPITAL_COMMUNITY)
Admission: RE | Admit: 2016-10-19 | Discharge: 2016-10-19 | Disposition: A | Discharge status: Home / Self Care | Payer: Medicare HMO | Source: Ambulatory Visit | Attending: Family Medicine | Admitting: Family Medicine

## 2016-10-19 ENCOUNTER — Other Ambulatory Visit: Payer: Self-pay | Admitting: Family Medicine

## 2016-10-19 DIAGNOSIS — R05 Cough: Secondary | ICD-10-CM | POA: Diagnosis not present

## 2016-10-19 DIAGNOSIS — J9811 Atelectasis: Secondary | ICD-10-CM | POA: Insufficient documentation

## 2016-10-19 DIAGNOSIS — E785 Hyperlipidemia, unspecified: Secondary | ICD-10-CM | POA: Diagnosis not present

## 2016-10-19 DIAGNOSIS — I700 Atherosclerosis of aorta: Secondary | ICD-10-CM | POA: Insufficient documentation

## 2016-10-19 DIAGNOSIS — R059 Cough, unspecified: Secondary | ICD-10-CM

## 2016-10-19 DIAGNOSIS — R5383 Other fatigue: Secondary | ICD-10-CM | POA: Diagnosis not present

## 2016-10-19 DIAGNOSIS — M1A9XX0 Chronic gout, unspecified, without tophus (tophi): Secondary | ICD-10-CM | POA: Diagnosis not present

## 2016-10-19 DIAGNOSIS — R7303 Prediabetes: Secondary | ICD-10-CM | POA: Diagnosis not present

## 2016-10-19 DIAGNOSIS — I10 Essential (primary) hypertension: Secondary | ICD-10-CM | POA: Diagnosis not present

## 2016-10-19 HISTORY — DX: Atherosclerosis of aorta: I70.0

## 2016-10-20 LAB — HEPATIC FUNCTION PANEL
ALBUMIN: 4.3 g/dL (ref 3.6–4.8)
ALK PHOS: 94 IU/L (ref 39–117)
ALT: 17 IU/L (ref 0–44)
AST: 19 IU/L (ref 0–40)
BILIRUBIN TOTAL: 0.7 mg/dL (ref 0.0–1.2)
Bilirubin, Direct: 0.19 mg/dL (ref 0.00–0.40)
TOTAL PROTEIN: 6.7 g/dL (ref 6.0–8.5)

## 2016-10-20 LAB — BASIC METABOLIC PANEL
BUN / CREAT RATIO: 12 (ref 10–24)
BUN: 13 mg/dL (ref 8–27)
CO2: 25 mmol/L (ref 20–29)
CREATININE: 1.13 mg/dL (ref 0.76–1.27)
Calcium: 9 mg/dL (ref 8.6–10.2)
Chloride: 93 mmol/L — ABNORMAL LOW (ref 96–106)
GFR calc Af Amer: 77 mL/min/{1.73_m2} (ref >59)
GFR, EST NON AFRICAN AMERICAN: 66 mL/min/{1.73_m2} (ref >59)
Glucose: 107 mg/dL — ABNORMAL HIGH (ref 65–99)
POTASSIUM: 5.1 mmol/L (ref 3.5–5.2)
SODIUM: 134 mmol/L (ref 134–144)

## 2016-10-20 LAB — CBC WITH DIFFERENTIAL/PLATELET
BASOS: 1 %
Basophils Absolute: 0 10*3/uL (ref 0.0–0.2)
EOS (ABSOLUTE): 0.2 10*3/uL (ref 0.0–0.4)
EOS: 2 %
HEMATOCRIT: 39.5 % (ref 37.5–51.0)
Hemoglobin: 13.5 g/dL (ref 13.0–17.7)
IMMATURE GRANULOCYTES: 0 %
Immature Grans (Abs): 0 10*3/uL (ref 0.0–0.1)
Lymphocytes Absolute: 1.5 10*3/uL (ref 0.7–3.1)
Lymphs: 19 %
MCH: 29.3 pg (ref 26.6–33.0)
MCHC: 34.2 g/dL (ref 31.5–35.7)
MCV: 86 fL (ref 79–97)
MONOS ABS: 0.7 10*3/uL (ref 0.1–0.9)
Monocytes: 8 %
NEUTROS ABS: 5.8 10*3/uL (ref 1.4–7.0)
NEUTROS PCT: 70 %
Platelets: 237 10*3/uL (ref 150–379)
RBC: 4.6 x10E6/uL (ref 4.14–5.80)
RDW: 13.4 % (ref 12.3–15.4)
WBC: 8.2 10*3/uL (ref 3.4–10.8)

## 2016-10-20 LAB — LIPID PANEL
CHOLESTEROL TOTAL: 150 mg/dL (ref 100–199)
Chol/HDL Ratio: 3.5 ratio (ref 0.0–5.0)
HDL: 43 mg/dL (ref >39)
LDL Calculated: 85 mg/dL (ref 0–99)
Triglycerides: 111 mg/dL (ref 0–149)
VLDL Cholesterol Cal: 22 mg/dL (ref 5–40)

## 2016-10-20 LAB — URIC ACID: Uric Acid: 5.5 mg/dL (ref 3.7–8.6)

## 2016-10-28 ENCOUNTER — Ambulatory Visit (INDEPENDENT_AMBULATORY_CARE_PROVIDER_SITE_OTHER): Payer: Medicare HMO | Admitting: Family Medicine

## 2016-10-28 ENCOUNTER — Encounter: Payer: Self-pay | Admitting: Family Medicine

## 2016-10-28 VITALS — BP 126/86 | Ht 66.5 in | Wt 215.2 lb

## 2016-10-28 DIAGNOSIS — E785 Hyperlipidemia, unspecified: Secondary | ICD-10-CM

## 2016-10-28 DIAGNOSIS — M1A9XX0 Chronic gout, unspecified, without tophus (tophi): Secondary | ICD-10-CM | POA: Diagnosis not present

## 2016-10-28 DIAGNOSIS — R7301 Impaired fasting glucose: Secondary | ICD-10-CM

## 2016-10-28 DIAGNOSIS — I700 Atherosclerosis of aorta: Secondary | ICD-10-CM

## 2016-10-28 DIAGNOSIS — I10 Essential (primary) hypertension: Secondary | ICD-10-CM | POA: Diagnosis not present

## 2016-10-28 DIAGNOSIS — Z0000 Encounter for general adult medical examination without abnormal findings: Secondary | ICD-10-CM

## 2016-10-28 DIAGNOSIS — R7303 Prediabetes: Secondary | ICD-10-CM

## 2016-10-28 DIAGNOSIS — C61 Malignant neoplasm of prostate: Secondary | ICD-10-CM | POA: Diagnosis not present

## 2016-10-28 LAB — POCT GLYCOSYLATED HEMOGLOBIN (HGB A1C): HEMOGLOBIN A1C: 5.7

## 2016-10-28 MED ORDER — ATORVASTATIN CALCIUM 40 MG PO TABS: 40.0000 mg | ORAL_TABLET | Freq: Every day | ORAL | 2 refills | Status: DC

## 2016-10-28 MED ORDER — LISINOPRIL-HYDROCHLOROTHIAZIDE 20-25 MG PO TABS: 1.0000 | ORAL_TABLET | Freq: Every day | ORAL | 1 refills | Status: DC

## 2016-10-28 MED ORDER — ALLOPURINOL 100 MG PO TABS: 100.0000 mg | ORAL_TABLET | Freq: Every day | ORAL | 1 refills | Status: DC

## 2016-10-28 NOTE — Progress Notes (Signed)
Subjective:    Patient ID: [name] [name], male    DOB: 13-Jul-1950, 66 y.o.   MRN: [id number]  HPI AWV- Annual Wellness Visit  The patient was seen for their annual wellness visit. The patient's past medical history, surgical history, and family history were reviewed. Pertinent vaccines were reviewed ( tetanus, pneumonia, shingles, flu) The patient's medication list was reviewed and updated.  The height and weight were entered. The patient's current BMI is:34.3   Cognitive screening was completed. Outcome of Mini - Cog: pass  Falls within the past 6 months:none  Current tobacco usage: none (All patients who use tobacco were given written and verbal information on quitting)  Recent listing of emergency department/hospitalizations over the past year were reviewed.  current specialist the patient sees on a regular basis: urologist  Long discussion held with the patient regarding his overall health regarding the importance keeping sugars under control blood pressure under control weight under control. Denies any particular negative issues currently. Works hard at trying to take his medicines including blood pressure medicine cholesterol medicine gout medicine on a regular basis. Medicare annual wellness visit patient questionnaire was reviewed.  A written screening schedule for the patient for the next 5-10 years was given. Appropriate discussion of followup regarding next visit was discussed.   Patient concerned about cough and sneezing due to phlegm-feels it is related to GERD    Review of Systems  Constitutional: Negative for activity change, appetite change and fever.  HENT: Negative for congestion and rhinorrhea.   Eyes: Negative for discharge.  Respiratory: Negative for cough and wheezing.   Cardiovascular: Negative for chest pain.  Gastrointestinal: Negative for abdominal pain, blood in stool and vomiting.  Genitourinary: Negative for difficulty urinating and  frequency.  Musculoskeletal: Negative for neck pain.  Skin: Negative for rash.  Allergic/Immunologic: Negative for environmental allergies and food allergies.  Neurological: Negative for weakness and headaches.  Psychiatric/Behavioral: Negative for agitation.       Objective:   Physical Exam  Constitutional: He appears well-developed and well-nourished.  HENT:  Head: Normocephalic and atraumatic.  Right Ear: External ear normal.  Left Ear: External ear normal.  Nose: Nose normal.  Mouth/Throat: Oropharynx is clear and moist.  Eyes: Pupils are equal, round, and reactive to light. EOM are normal.  Neck: Normal range of motion. Neck supple. No thyromegaly present.  Cardiovascular: Normal rate, regular rhythm and normal heart sounds.   No murmur heard. Pulmonary/Chest: Effort normal and breath sounds normal. No respiratory distress. He has no wheezes.  Abdominal: Soft. Bowel sounds are normal. He exhibits no distension and no mass. There is no tenderness.  Genitourinary: Penis normal.  Musculoskeletal: Normal range of motion. He exhibits no edema.  Lymphadenopathy:    He has no cervical adenopathy.  Neurological: He is alert. He exhibits normal muscle tone.  Skin: Skin is warm and dry. No erythema.  Psychiatric: He has a normal mood and affect. His behavior is normal. Judgment normal.          Assessment & Plan:  Adult wellness-complete.wellness physical was conducted today. Importance of diet and exercise were discussed in detail. In addition to this a discussion regarding safety was also covered. We also reviewed over immunizations and gave recommendations regarding current immunization needed for age. In addition to this additional areas were also touched on including: Preventative health exams needed: Colonoscopy Last colonoscopy 2015 next colonoscopy 2025  Patient was advised yearly wellness exam  Prostate cancer-managed by specialist.  Recent renal  insufficiency now  creatinine is normal healthy diet recommended plenty of liquids  Prediabetes A1c 5.7  Fasting hyperglycemia watch starches stay active try to lose weight  Moderate obesity importance of regular exercise watching diet reducing weight discuss  Aortic atherosclerosis noted on x-ray keep cholesterol under good control  Hyperlipidemia previous labs reviewed continue medications  Follow-up in 6 months  No flareup of gout continue medication

## 2017-01-18 ENCOUNTER — Ambulatory Visit (INDEPENDENT_AMBULATORY_CARE_PROVIDER_SITE_OTHER): Payer: Medicare HMO | Admitting: *Deleted

## 2017-01-18 DIAGNOSIS — Z23 Encounter for immunization: Secondary | ICD-10-CM

## 2017-03-07 DIAGNOSIS — Z8546 Personal history of malignant neoplasm of prostate: Secondary | ICD-10-CM | POA: Diagnosis not present

## 2017-03-07 DIAGNOSIS — E291 Testicular hypofunction: Secondary | ICD-10-CM | POA: Diagnosis not present

## 2017-03-11 ENCOUNTER — Ambulatory Visit: Payer: Medicare HMO | Admitting: Urology

## 2017-03-11 DIAGNOSIS — Z8546 Personal history of malignant neoplasm of prostate: Secondary | ICD-10-CM

## 2017-03-11 DIAGNOSIS — N5231 Erectile dysfunction following radical prostatectomy: Secondary | ICD-10-CM

## 2017-03-11 DIAGNOSIS — E291 Testicular hypofunction: Secondary | ICD-10-CM

## 2017-03-23 ENCOUNTER — Other Ambulatory Visit: Payer: Self-pay | Admitting: Family Medicine

## 2017-03-29 ENCOUNTER — Ambulatory Visit: Payer: Commercial Managed Care - HMO | Admitting: Orthopedic Surgery

## 2017-03-31 ENCOUNTER — Encounter: Payer: Self-pay | Admitting: Orthopedic Surgery

## 2017-04-01 ENCOUNTER — Ambulatory Visit: Payer: Medicare HMO | Admitting: Orthopedic Surgery

## 2017-04-01 ENCOUNTER — Ambulatory Visit (INDEPENDENT_AMBULATORY_CARE_PROVIDER_SITE_OTHER): Payer: Medicare HMO

## 2017-04-01 DIAGNOSIS — Z96641 Presence of right artificial hip joint: Secondary | ICD-10-CM | POA: Diagnosis not present

## 2017-04-01 DIAGNOSIS — M25552 Pain in left hip: Secondary | ICD-10-CM

## 2017-04-01 NOTE — Progress Notes (Signed)
Progress note  Follow-up hip replacement 2015  History this is a 67 year old male status post right total hip 4 years ago  He does not have any complaints related to the right hip  Review of systems complains of some left hip pain and groin pain  Vital signs height 5 6 weight 212 blood pressure 118/57 pulse 75 Physical Exam  Neurological:  Patient has somewhat of an abductor lurch   No support needed for gait.  Right hip no tenderness flexion 100 degrees stable flexion strength normal skin intact neurovascular exam intact  Left hip: Mild painful range of motion strength normal stability normal    Encounter Diagnoses  Name Primary?  . Status post total replacement of right hip 04/20/13 Yes  . Pain in joint of left hip     X-rays show stable prosthesis on the right and developing arthritis on the left which is symptomatic but stable enough that we do not have to do anything about it now.  Plan annual follow-up x-ray

## 2017-04-18 ENCOUNTER — Telehealth: Payer: Self-pay | Admitting: Family Medicine

## 2017-04-18 DIAGNOSIS — M109 Gout, unspecified: Secondary | ICD-10-CM

## 2017-04-18 DIAGNOSIS — I10 Essential (primary) hypertension: Secondary | ICD-10-CM

## 2017-04-18 DIAGNOSIS — E785 Hyperlipidemia, unspecified: Secondary | ICD-10-CM

## 2017-04-18 NOTE — Telephone Encounter (Signed)
Patient has an appointment on 04/25/17 with Dr. [name].  He is requesting orders for labs to be done before the appointment.

## 2017-04-18 NOTE — Telephone Encounter (Signed)
Metabolic 7, lipid, uric acid-hyperlipidemia, HTN, gout.  Please verify with patient that he does continue his office visits with [facility] and that they are following his prostate/PSA history

## 2017-04-18 NOTE — Telephone Encounter (Signed)
Patient had labs 09/2016:  Lipid, Liver, Met 7 , CBC, Uric Acid, HgbA1c

## 2017-04-19 NOTE — Telephone Encounter (Signed)
Pt states [facility] is following psa. Orders put in and pt notified he needs to be fasting when he goes.

## 2017-04-20 DIAGNOSIS — I10 Essential (primary) hypertension: Secondary | ICD-10-CM | POA: Diagnosis not present

## 2017-04-20 DIAGNOSIS — E785 Hyperlipidemia, unspecified: Secondary | ICD-10-CM | POA: Diagnosis not present

## 2017-04-20 DIAGNOSIS — M109 Gout, unspecified: Secondary | ICD-10-CM | POA: Diagnosis not present

## 2017-04-21 LAB — BASIC METABOLIC PANEL
BUN / CREAT RATIO: 13 (ref 10–24)
BUN: 16 mg/dL (ref 8–27)
CALCIUM: 8.8 mg/dL (ref 8.6–10.2)
CO2: 26 mmol/L (ref 20–29)
CREATININE: 1.2 mg/dL (ref 0.76–1.27)
Chloride: 91 mmol/L — ABNORMAL LOW (ref 96–106)
GFR, EST AFRICAN AMERICAN: 71 mL/min/{1.73_m2} (ref >59)
GFR, EST NON AFRICAN AMERICAN: 62 mL/min/{1.73_m2} (ref >59)
Glucose: 100 mg/dL — ABNORMAL HIGH (ref 65–99)
Potassium: 4.7 mmol/L (ref 3.5–5.2)
Sodium: 132 mmol/L — ABNORMAL LOW (ref 134–144)

## 2017-04-21 LAB — LIPID PANEL
CHOLESTEROL TOTAL: 138 mg/dL (ref 100–199)
Chol/HDL Ratio: 2.9 ratio (ref 0.0–5.0)
HDL: 48 mg/dL (ref >39)
LDL Calculated: 73 mg/dL (ref 0–99)
TRIGLYCERIDES: 85 mg/dL (ref 0–149)
VLDL Cholesterol Cal: 17 mg/dL (ref 5–40)

## 2017-04-21 LAB — URIC ACID: URIC ACID: 4.9 mg/dL (ref 3.7–8.6)

## 2017-04-25 ENCOUNTER — Ambulatory Visit (INDEPENDENT_AMBULATORY_CARE_PROVIDER_SITE_OTHER): Payer: Medicare HMO | Admitting: Family Medicine

## 2017-04-25 VITALS — BP 132/70 | Ht 65.5 in | Wt 212.0 lb

## 2017-04-25 DIAGNOSIS — M109 Gout, unspecified: Secondary | ICD-10-CM

## 2017-04-25 DIAGNOSIS — I10 Essential (primary) hypertension: Secondary | ICD-10-CM

## 2017-04-25 DIAGNOSIS — E785 Hyperlipidemia, unspecified: Secondary | ICD-10-CM

## 2017-04-25 MED ORDER — ALLOPURINOL 100 MG PO TABS: 100.0000 mg | ORAL_TABLET | Freq: Every day | ORAL | 1 refills | Status: DC

## 2017-04-25 MED ORDER — ATORVASTATIN CALCIUM 40 MG PO TABS: 40.0000 mg | ORAL_TABLET | Freq: Every day | ORAL | 2 refills | Status: DC

## 2017-04-25 MED ORDER — LISINOPRIL-HYDROCHLOROTHIAZIDE 20-25 MG PO TABS: 1.0000 | ORAL_TABLET | Freq: Every day | ORAL | 1 refills | Status: DC

## 2017-04-25 NOTE — Patient Instructions (Signed)
DASH Eating Plan DASH stands for "Dietary Approaches to Stop Hypertension." The DASH eating plan is a healthy eating plan that has been shown to reduce high blood pressure (hypertension). It may also reduce your risk for type 2 diabetes, heart disease, and stroke. The DASH eating plan may also help with weight loss. What are tips for following this plan? General guidelines  Avoid eating more than 2,300 mg (milligrams) of salt (sodium) a day. If you have hypertension, you may need to reduce your sodium intake to 1,500 mg a day.  Limit alcohol intake to no more than 1 drink a day for nonpregnant women and 2 drinks a day for men. One drink equals 12 oz of beer, 5 oz of wine, or 1 oz of hard liquor.  Work with your health care provider to maintain a healthy body weight or to lose weight. Ask what an ideal weight is for you.  Get at least 30 minutes of exercise that causes your heart to beat faster (aerobic exercise) most days of the week. Activities may include walking, swimming, or biking.  Work with your health care provider or diet and nutrition specialist (dietitian) to adjust your eating plan to your individual calorie needs. Reading food labels  Check food labels for the amount of sodium per serving. Choose foods with less than 5 percent of the Daily Value of sodium. Generally, foods with less than 300 mg of sodium per serving fit into this eating plan.  To find whole grains, look for the word "whole" as the first word in the ingredient list. Shopping  Buy products labeled as "low-sodium" or "no salt added."  Buy fresh foods. Avoid canned foods and premade or frozen meals. Cooking  Avoid adding salt when cooking. Use salt-free seasonings or herbs instead of table salt or sea salt. Check with your health care provider or pharmacist before using salt substitutes.  Do not fry foods. Cook foods using healthy methods such as baking, boiling, grilling, and broiling instead.  Cook with  heart-healthy oils, such as olive, canola, soybean, or sunflower oil. Meal planning   Eat a balanced diet that includes: ? 5 or more servings of fruits and vegetables each day. At each meal, try to fill half of your plate with fruits and vegetables. ? Up to 6-8 servings of whole grains each day. ? Less than 6 oz of lean meat, poultry, or fish each day. A 3-oz serving of meat is about the same size as a deck of cards. One egg equals 1 oz. ? 2 servings of low-fat dairy each day. ? A serving of nuts, seeds, or beans 5 times each week. ? Heart-healthy fats. Healthy fats called Omega-3 fatty acids are found in foods such as flaxseeds and coldwater fish, like sardines, salmon, and mackerel.  Limit how much you eat of the following: ? Canned or prepackaged foods. ? Food that is high in trans fat, such as fried foods. ? Food that is high in saturated fat, such as fatty meat. ? Sweets, desserts, sugary drinks, and other foods with added sugar. ? Full-fat dairy products.  Do not salt foods before eating.  Try to eat at least 2 vegetarian meals each week.  Eat more home-cooked food and less restaurant, buffet, and fast food.  When eating at a restaurant, ask that your food be prepared with less salt or no salt, if possible. What foods are recommended? The items listed may not be a complete list. Talk with your dietitian about what   dietary choices are best for you. Grains Whole-grain or whole-wheat bread. Whole-grain or whole-wheat pasta. Brown rice. Oatmeal. Quinoa. Bulgur. Whole-grain and low-sodium cereals. Pita bread. Low-fat, low-sodium crackers. Whole-wheat flour tortillas. Vegetables Fresh or frozen vegetables (raw, steamed, roasted, or grilled). Low-sodium or reduced-sodium tomato and vegetable juice. Low-sodium or reduced-sodium tomato sauce and tomato paste. Low-sodium or reduced-sodium canned vegetables. Fruits All fresh, dried, or frozen fruit. Canned fruit in natural juice (without  added sugar). Meat and other protein foods Skinless chicken or turkey. Ground chicken or turkey. Pork with fat trimmed off. Fish and seafood. Egg whites. Dried beans, peas, or lentils. Unsalted nuts, nut butters, and seeds. Unsalted canned beans. Lean cuts of beef with fat trimmed off. Low-sodium, lean deli meat. Dairy Low-fat (1%) or fat-free (skim) milk. Fat-free, low-fat, or reduced-fat cheeses. Nonfat, low-sodium ricotta or cottage cheese. Low-fat or nonfat yogurt. Low-fat, low-sodium cheese. Fats and oils Soft margarine without trans fats. Vegetable oil. Low-fat, reduced-fat, or light mayonnaise and salad dressings (reduced-sodium). Canola, safflower, olive, soybean, and sunflower oils. Avocado. Seasoning and other foods Herbs. Spices. Seasoning mixes without salt. Unsalted popcorn and pretzels. Fat-free sweets. What foods are not recommended? The items listed may not be a complete list. Talk with your dietitian about what dietary choices are best for you. Grains Baked goods made with fat, such as croissants, muffins, or some breads. Dry pasta or rice meal packs. Vegetables Creamed or fried vegetables. Vegetables in a cheese sauce. Regular canned vegetables (not low-sodium or reduced-sodium). Regular canned tomato sauce and paste (not low-sodium or reduced-sodium). Regular tomato and vegetable juice (not low-sodium or reduced-sodium). Pickles. Olives. Fruits Canned fruit in a light or heavy syrup. Fried fruit. Fruit in cream or butter sauce. Meat and other protein foods Fatty cuts of meat. Ribs. Fried meat. Bacon. Sausage. Bologna and other processed lunch meats. Salami. Fatback. Hotdogs. Bratwurst. Salted nuts and seeds. Canned beans with added salt. Canned or smoked fish. Whole eggs or egg yolks. Chicken or turkey with skin. Dairy Whole or 2% milk, cream, and half-and-half. Whole or full-fat cream cheese. Whole-fat or sweetened yogurt. Full-fat cheese. Nondairy creamers. Whipped toppings.  Processed cheese and cheese spreads. Fats and oils Butter. Stick margarine. Lard. Shortening. Ghee. Bacon fat. Tropical oils, such as coconut, palm kernel, or palm oil. Seasoning and other foods Salted popcorn and pretzels. Onion salt, garlic salt, seasoned salt, table salt, and sea salt. Worcestershire sauce. Tartar sauce. Barbecue sauce. Teriyaki sauce. Soy sauce, including reduced-sodium. Steak sauce. Canned and packaged gravies. Fish sauce. Oyster sauce. Cocktail sauce. Horseradish that you find on the shelf. Ketchup. Mustard. Meat flavorings and tenderizers. Bouillon cubes. Hot sauce and Tabasco sauce. Premade or packaged marinades. Premade or packaged taco seasonings. Relishes. Regular salad dressings. Where to find more information:  National Heart, Lung, and Blood Institute: www.nhlbi.nih.gov  American Heart Association: www.heart.org Summary  The DASH eating plan is a healthy eating plan that has been shown to reduce high blood pressure (hypertension). It may also reduce your risk for type 2 diabetes, heart disease, and stroke.  With the DASH eating plan, you should limit salt (sodium) intake to 2,300 mg a day. If you have hypertension, you may need to reduce your sodium intake to 1,500 mg a day.  When on the DASH eating plan, aim to eat more fresh fruits and vegetables, whole grains, lean proteins, low-fat dairy, and heart-healthy fats.  Work with your health care provider or diet and nutrition specialist (dietitian) to adjust your eating plan to your individual   calorie needs. This information is not intended to replace advice given to you by your health care provider. Make sure you discuss any questions you have with your health care provider. Document Released: 02/25/2011 Document Revised: 03/01/2016 Document Reviewed: 03/01/2016 Elsevier Interactive Patient Education  2018 [employer]  

## 2017-04-25 NOTE — Progress Notes (Addendum)
   Subjective:    Patient ID: [name] [name], male    DOB: 09/01/1950, 67 y.o.   MRN: [id number]  Hypertension  This is a chronic problem. Pertinent negatives include no chest pain, headaches or shortness of breath. There are no compliance problems (takes meds every day, eats healthy, exercises).    Pt states no concerns today.  Patient here for follow-up regarding cholesterol.  Patient does try to maintain a reasonable diet.  Patient does take the medication on a regular basis.  Denies missing a dose.  The patient denies any obvious side effects.  Prior blood work results reviewed with the patient.  The patient is aware of his cholesterol goals and the need to keep it under good control to lessen the risk of disease.  Patient has not had any flareups of gout watching diet try to stay active Review of Systems  Constitutional: Negative for activity change, appetite change and fatigue.  HENT: Negative for congestion and rhinorrhea.   Respiratory: Negative for cough, chest tightness and shortness of breath.   Cardiovascular: Negative for chest pain and leg swelling.  Gastrointestinal: Negative for abdominal pain, diarrhea and nausea.  Endocrine: Negative for polydipsia and polyphagia.  Genitourinary: Negative for dysuria and hematuria.  Neurological: Negative for weakness and headaches.  Psychiatric/Behavioral: Negative for confusion and dysphoric mood.       Objective:   Physical Exam  Constitutional: He appears well-nourished. No distress.  HENT:  Head: Normocephalic and atraumatic.  Eyes: Right eye exhibits no discharge. Left eye exhibits no discharge.  Neck: No tracheal deviation present.  Cardiovascular: Normal rate, regular rhythm and normal heart sounds.  No murmur heard. Pulmonary/Chest: Effort normal and breath sounds normal. No respiratory distress. He has no wheezes.  Musculoskeletal: He exhibits no edema.  Lymphadenopathy:    He has no cervical adenopathy.    Neurological: He is alert.  Skin: Skin is warm. No rash noted.  Psychiatric: His behavior is normal.  Vitals reviewed.     The patient's history of prostate cancer followed by urology    Assessment & Plan:  1. Hyperlipidemia, unspecified hyperlipidemia type The patient was seen today as part of an evaluation regarding hyperlipidemia. Recent lab work has been reviewed with the patient as well as the goals for good cholesterol care. In addition to this medications have been discussed the importance of compliance with diet and medications discussed as well. Patient has been informed of potential side effects of medications in the importance to notify us should any problems occur. Finally the patient is aware that poor control of cholesterol, noncompliance can dramatically increase her risk of heart attack strokes and premature death. The patient will keep regular office visits and the patient does agreed to periodic lab work.   2. Gout, unspecified cause, unspecified chronicity, unspecified site Gout under good control lab work looks good  3. Hypertension, unspecified type HTN- Patient was seen today as part of a visit regarding hypertension. The importance of healthy diet and regular physical activity was discussed. The importance of compliance with medications discussed. Ideal goal is to keep blood pressure low elevated levels certainly below 130/86 when possible. The patient was counseled that keeping blood pressure under control lessen his risk of heart attack, stroke, kidney failure, and early death. The importance of regular follow-ups was discussed with the patient. Low-salt diet such as DASH recommended. Regular physical activity was recommended as well. Patient was advised to keep regular follow-ups.

## 2017-04-28 DIAGNOSIS — H5200 Hypermetropia, unspecified eye: Secondary | ICD-10-CM | POA: Diagnosis not present

## 2017-06-10 DIAGNOSIS — G4733 Obstructive sleep apnea (adult) (pediatric): Secondary | ICD-10-CM | POA: Diagnosis not present

## 2017-06-27 ENCOUNTER — Ambulatory Visit (INDEPENDENT_AMBULATORY_CARE_PROVIDER_SITE_OTHER): Payer: Medicare HMO | Admitting: Otolaryngology

## 2017-07-25 ENCOUNTER — Ambulatory Visit (INDEPENDENT_AMBULATORY_CARE_PROVIDER_SITE_OTHER): Payer: Medicare HMO | Admitting: Otolaryngology

## 2017-07-25 DIAGNOSIS — R05 Cough: Secondary | ICD-10-CM | POA: Diagnosis not present

## 2017-07-25 DIAGNOSIS — J382 Nodules of vocal cords: Secondary | ICD-10-CM | POA: Diagnosis not present

## 2017-07-25 DIAGNOSIS — H6123 Impacted cerumen, bilateral: Secondary | ICD-10-CM | POA: Diagnosis not present

## 2017-07-29 ENCOUNTER — Other Ambulatory Visit (INDEPENDENT_AMBULATORY_CARE_PROVIDER_SITE_OTHER): Payer: Self-pay | Admitting: Otolaryngology

## 2017-07-29 DIAGNOSIS — J3800 Paralysis of vocal cords and larynx, unspecified: Secondary | ICD-10-CM

## 2017-08-16 ENCOUNTER — Ambulatory Visit (HOSPITAL_COMMUNITY)
Admission: RE | Admit: 2017-08-16 | Discharge: 2017-08-16 | Disposition: A | Discharge status: Home / Self Care | Payer: Medicare HMO | Source: Ambulatory Visit | Attending: Otolaryngology | Admitting: Otolaryngology

## 2017-08-16 DIAGNOSIS — J3800 Paralysis of vocal cords and larynx, unspecified: Secondary | ICD-10-CM

## 2017-08-16 DIAGNOSIS — J383 Other diseases of vocal cords: Secondary | ICD-10-CM | POA: Insufficient documentation

## 2017-08-16 DIAGNOSIS — E041 Nontoxic single thyroid nodule: Secondary | ICD-10-CM | POA: Diagnosis not present

## 2017-08-16 LAB — POCT I-STAT CREATININE: CREATININE: 1.1 mg/dL (ref 0.61–1.24)

## 2017-08-16 MED ORDER — IOPAMIDOL (ISOVUE-300) INJECTION 61%: 100.0000 mL | Freq: Once | INTRAVENOUS | Status: DC | PRN

## 2017-08-16 MED ORDER — IOPAMIDOL (ISOVUE-300) INJECTION 61%
75.0000 mL | Freq: Once | INTRAVENOUS | Status: AC | PRN
  Administered 2017-08-16: 75 mL via INTRAVENOUS

## 2017-08-22 ENCOUNTER — Ambulatory Visit (INDEPENDENT_AMBULATORY_CARE_PROVIDER_SITE_OTHER): Payer: Medicare HMO | Admitting: Otolaryngology

## 2017-08-22 DIAGNOSIS — K219 Gastro-esophageal reflux disease without esophagitis: Secondary | ICD-10-CM

## 2017-08-22 DIAGNOSIS — R05 Cough: Secondary | ICD-10-CM | POA: Diagnosis not present

## 2017-09-01 DIAGNOSIS — Z8546 Personal history of malignant neoplasm of prostate: Secondary | ICD-10-CM | POA: Diagnosis not present

## 2017-09-09 ENCOUNTER — Ambulatory Visit: Payer: Medicare HMO | Admitting: Urology

## 2017-09-09 DIAGNOSIS — Z8546 Personal history of malignant neoplasm of prostate: Secondary | ICD-10-CM

## 2017-09-09 DIAGNOSIS — N5231 Erectile dysfunction following radical prostatectomy: Secondary | ICD-10-CM | POA: Diagnosis not present

## 2017-09-09 DIAGNOSIS — N3941 Urge incontinence: Secondary | ICD-10-CM

## 2017-09-09 DIAGNOSIS — E291 Testicular hypofunction: Secondary | ICD-10-CM

## 2017-10-03 ENCOUNTER — Ambulatory Visit (INDEPENDENT_AMBULATORY_CARE_PROVIDER_SITE_OTHER): Payer: Medicare HMO | Admitting: Otolaryngology

## 2017-10-03 DIAGNOSIS — R05 Cough: Secondary | ICD-10-CM | POA: Diagnosis not present

## 2017-10-03 DIAGNOSIS — K219 Gastro-esophageal reflux disease without esophagitis: Secondary | ICD-10-CM

## 2017-10-12 ENCOUNTER — Telehealth: Payer: Self-pay | Admitting: Family Medicine

## 2017-10-12 DIAGNOSIS — Z125 Encounter for screening for malignant neoplasm of prostate: Secondary | ICD-10-CM

## 2017-10-12 DIAGNOSIS — M1A9XX0 Chronic gout, unspecified, without tophus (tophi): Secondary | ICD-10-CM

## 2017-10-12 DIAGNOSIS — R7303 Prediabetes: Secondary | ICD-10-CM

## 2017-10-12 DIAGNOSIS — I10 Essential (primary) hypertension: Secondary | ICD-10-CM

## 2017-10-12 DIAGNOSIS — E785 Hyperlipidemia, unspecified: Secondary | ICD-10-CM

## 2017-10-12 NOTE — Telephone Encounter (Signed)
Lipid, liver, metabolic 7, L1G, uric acid   please talk with the patient make sure his specialist is following his PSA  If not we can order a PSA

## 2017-10-12 NOTE — Telephone Encounter (Signed)
Last labs 1/19- Lipid, Met 7 and Uric Acid

## 2017-10-12 NOTE — Telephone Encounter (Signed)
Patient needing labs for upcoming visit 8/5.

## 2017-10-13 NOTE — Telephone Encounter (Signed)
Blood work ordered in Epic. Telephone call no answer 

## 2017-10-14 NOTE — Telephone Encounter (Signed)
Patient notified

## 2017-10-17 ENCOUNTER — Telehealth: Payer: Self-pay | Admitting: Family Medicine

## 2017-10-17 DIAGNOSIS — M1A9XX0 Chronic gout, unspecified, without tophus (tophi): Secondary | ICD-10-CM | POA: Diagnosis not present

## 2017-10-17 DIAGNOSIS — E785 Hyperlipidemia, unspecified: Secondary | ICD-10-CM | POA: Diagnosis not present

## 2017-10-17 DIAGNOSIS — Z125 Encounter for screening for malignant neoplasm of prostate: Secondary | ICD-10-CM | POA: Diagnosis not present

## 2017-10-17 DIAGNOSIS — R7303 Prediabetes: Secondary | ICD-10-CM | POA: Diagnosis not present

## 2017-10-17 DIAGNOSIS — I10 Essential (primary) hypertension: Secondary | ICD-10-CM | POA: Diagnosis not present

## 2017-10-17 NOTE — Telephone Encounter (Signed)
Patient was seen by Dr.Teoh for evaluation of chronic cough he states if he does not get better over the course the next month would recommend potentially switch lisinopril

## 2017-10-18 LAB — BASIC METABOLIC PANEL
BUN / CREAT RATIO: 12 (ref 10–24)
BUN: 14 mg/dL (ref 8–27)
CO2: 27 mmol/L (ref 20–29)
Calcium: 9.2 mg/dL (ref 8.6–10.2)
Chloride: 90 mmol/L — ABNORMAL LOW (ref 96–106)
Creatinine, Ser: 1.18 mg/dL (ref 0.76–1.27)
GFR calc non Af Amer: 63 mL/min/{1.73_m2} (ref >59)
GFR, EST AFRICAN AMERICAN: 72 mL/min/{1.73_m2} (ref >59)
GLUCOSE: 109 mg/dL — AB (ref 65–99)
Potassium: 4.9 mmol/L (ref 3.5–5.2)
Sodium: 131 mmol/L — ABNORMAL LOW (ref 134–144)

## 2017-10-18 LAB — HEPATIC FUNCTION PANEL
ALK PHOS: 98 IU/L (ref 39–117)
ALT: 16 IU/L (ref 0–44)
AST: 21 IU/L (ref 0–40)
Albumin: 4.3 g/dL (ref 3.6–4.8)
BILIRUBIN, DIRECT: 0.24 mg/dL (ref 0.00–0.40)
Bilirubin Total: 0.9 mg/dL (ref 0.0–1.2)
Total Protein: 7.1 g/dL (ref 6.0–8.5)

## 2017-10-18 LAB — LIPID PANEL
CHOL/HDL RATIO: 3 ratio (ref 0.0–5.0)
Cholesterol, Total: 146 mg/dL (ref 100–199)
HDL: 49 mg/dL (ref >39)
LDL Calculated: 82 mg/dL (ref 0–99)
TRIGLYCERIDES: 76 mg/dL (ref 0–149)
VLDL CHOLESTEROL CAL: 15 mg/dL (ref 5–40)

## 2017-10-18 LAB — PSA

## 2017-10-18 LAB — HEMOGLOBIN A1C
Est. average glucose Bld gHb Est-mCnc: 117 mg/dL
Hgb A1c MFr Bld: 5.7 % — ABNORMAL HIGH (ref 4.8–5.6)

## 2017-10-18 LAB — URIC ACID: Uric Acid: 4.6 mg/dL (ref 3.7–8.6)

## 2017-10-24 ENCOUNTER — Encounter: Payer: Self-pay | Admitting: Family Medicine

## 2017-10-24 ENCOUNTER — Ambulatory Visit (INDEPENDENT_AMBULATORY_CARE_PROVIDER_SITE_OTHER): Payer: Medicare HMO | Admitting: Family Medicine

## 2017-10-24 VITALS — BP 128/82 | Ht 65.5 in | Wt 208.4 lb

## 2017-10-24 DIAGNOSIS — E785 Hyperlipidemia, unspecified: Secondary | ICD-10-CM

## 2017-10-24 DIAGNOSIS — Z0000 Encounter for general adult medical examination without abnormal findings: Secondary | ICD-10-CM

## 2017-10-24 DIAGNOSIS — I10 Essential (primary) hypertension: Secondary | ICD-10-CM

## 2017-10-24 DIAGNOSIS — M1A9XX0 Chronic gout, unspecified, without tophus (tophi): Secondary | ICD-10-CM

## 2017-10-24 DIAGNOSIS — I700 Atherosclerosis of aorta: Secondary | ICD-10-CM | POA: Diagnosis not present

## 2017-10-24 MED ORDER — ATORVASTATIN CALCIUM 40 MG PO TABS: 40.0000 mg | ORAL_TABLET | Freq: Every day | ORAL | 1 refills | Status: DC

## 2017-10-24 MED ORDER — LISINOPRIL-HYDROCHLOROTHIAZIDE 20-25 MG PO TABS: 1.0000 | ORAL_TABLET | Freq: Every day | ORAL | 1 refills | Status: DC

## 2017-10-24 MED ORDER — ZOSTER VAC RECOMB ADJUVANTED 50 MCG/0.5ML IM SUSR: 0.5000 mL | Freq: Once | INTRAMUSCULAR | 1 refills | Status: AC

## 2017-10-24 MED ORDER — ALLOPURINOL 100 MG PO TABS: 100.0000 mg | ORAL_TABLET | Freq: Every day | ORAL | 1 refills | Status: DC

## 2017-10-24 NOTE — Progress Notes (Signed)
Subjective:    Patient ID: [name] [name], male    DOB: Oct 06, 1950, 67 y.o.   MRN: [id number]  Hypertension  This is a chronic problem. The current episode started more than 1 year ago. Pertinent negatives include no chest pain, headaches or shortness of breath. Risk factors for coronary artery disease include dyslipidemia and male gender. Treatments tried: lisinopril/hctz. There are no compliance problems.   Patient has history of aortic atherosclerosis Has a history of hyperlipidemia Takes his medication  AWV- Annual Wellness Visit  The patient was seen for their annual wellness visit. The patient's past medical history, surgical history, and family history were reviewed. Pertinent vaccines were reviewed ( tetanus, pneumonia, shingles, flu) The patient's medication list was reviewed and updated.  The height and weight were entered.  BMI recorded in electronic record elsewhere  Cognitive screening was completed. Outcome of Mini - Cog: He passes   Falls /depression screening electronically recorded within record elsewhere  Current tobacco usage: Does not smoke (All patients who use tobacco were given written and verbal information on quitting)  Recent listing of emergency department/hospitalizations over the past year were reviewed.  current specialist the patient sees on a regular basis: Sees urology and occasionally orthopedist   Medicare annual wellness visit patient questionnaire was reviewed.  A written screening schedule for the patient for the next 5-10 years was given. Appropriate discussion of followup regarding next visit was discussed.    Passes Denies any chest tightness pressure pain Patient here for follow-up regarding cholesterol.  The patient does have hyperlipidemia.  Patient does try to maintain a reasonable diet.  Patient does take the medication on a regular basis.  Denies missing a dose.  The patient denies any obvious side effects.  Prior blood work  results reviewed with the patient.  The patient is aware of his cholesterol goals and the need to keep it under good control to lessen the risk of disease.   Discuss recent labs Review of Systems  Constitutional: Negative for activity change, appetite change and fatigue.  HENT: Negative for congestion and rhinorrhea.   Respiratory: Negative for cough and shortness of breath.   Cardiovascular: Negative for chest pain and leg swelling.  Gastrointestinal: Negative for abdominal pain, nausea and vomiting.  Neurological: Negative for dizziness and headaches.  Psychiatric/Behavioral: Negative for agitation and behavioral problems.       Objective:   Physical Exam  Constitutional: He appears well-nourished. No distress.  HENT:  Head: Normocephalic and atraumatic.  Eyes: Right eye exhibits no discharge. Left eye exhibits no discharge.  Neck: No tracheal deviation present.  Cardiovascular: Normal rate, regular rhythm and normal heart sounds.  No murmur heard. Pulmonary/Chest: Effort normal and breath sounds normal. No respiratory distress.  Musculoskeletal: He exhibits no edema.  Lymphadenopathy:    He has no cervical adenopathy.  Neurological: He is alert. Coordination normal.  Skin: Skin is warm and dry.  Psychiatric: He has a normal mood and affect. His behavior is normal.  Vitals reviewed.         Assessment & Plan:  HTN- Patient was seen today as part of a visit regarding hypertension. The importance of healthy diet and regular physical activity was discussed. The importance of compliance with medications discussed.  Ideal goal is to keep blood pressure low elevated levels certainly below 683/41 when possible.  The patient was counseled that keeping blood pressure under control lessen his risk of complications.  The importance of regular follow-ups was discussed with the  patient.  Low-salt diet such as DASH recommended.  Regular physical activity was recommended as well.    Patient was advised to keep regular follow-ups.  The patient was seen today as part of an evaluation regarding hyperlipidemia.  Recent lab work has been reviewed with the patient as well as the goals for good cholesterol care.  In addition to this medications have been discussed the importance of compliance with diet and medications discussed as well.  Finally the patient is aware that poor control of cholesterol, noncompliance can dramatically increase the risk of complications. The patient will keep regular office visits and the patient does agreed to periodic lab work.  Patient has history of prostate cancer PSA under good control  Patient has history of arthritis specialist at this point time does not want to do surgery on his hip  Patient is trying to stay active and stay overall healthy  Adult wellness-complete.wellness physical was conducted today. Importance of diet and exercise were discussed in detail.  In addition to this a discussion regarding safety was also covered. We also reviewed over immunizations and gave recommendations regarding current immunization needed for age.  In addition to this additional areas were also touched on including: Preventative health exams needed:  Colonoscopy up-to-date on colonoscopy [name] recommended  Patient was advised yearly wellness exam

## 2017-10-24 NOTE — Patient Instructions (Signed)

## 2017-11-17 ENCOUNTER — Encounter: Payer: Self-pay | Admitting: Family Medicine

## 2018-01-19 ENCOUNTER — Ambulatory Visit (INDEPENDENT_AMBULATORY_CARE_PROVIDER_SITE_OTHER): Payer: Medicare HMO | Admitting: *Deleted

## 2018-01-19 DIAGNOSIS — Z23 Encounter for immunization: Secondary | ICD-10-CM | POA: Diagnosis not present

## 2018-01-25 ENCOUNTER — Ambulatory Visit: Payer: Medicare HMO

## 2018-03-16 ENCOUNTER — Other Ambulatory Visit: Payer: Self-pay | Admitting: Family Medicine

## 2018-03-27 DIAGNOSIS — Z8546 Personal history of malignant neoplasm of prostate: Secondary | ICD-10-CM | POA: Diagnosis not present

## 2018-03-27 DIAGNOSIS — E291 Testicular hypofunction: Secondary | ICD-10-CM | POA: Diagnosis not present

## 2018-03-31 ENCOUNTER — Ambulatory Visit: Payer: Medicare HMO | Admitting: Urology

## 2018-03-31 DIAGNOSIS — Z8546 Personal history of malignant neoplasm of prostate: Secondary | ICD-10-CM

## 2018-03-31 DIAGNOSIS — N3941 Urge incontinence: Secondary | ICD-10-CM

## 2018-03-31 DIAGNOSIS — E291 Testicular hypofunction: Secondary | ICD-10-CM | POA: Diagnosis not present

## 2018-03-31 DIAGNOSIS — N5231 Erectile dysfunction following radical prostatectomy: Secondary | ICD-10-CM | POA: Diagnosis not present

## 2018-04-03 ENCOUNTER — Ambulatory Visit (INDEPENDENT_AMBULATORY_CARE_PROVIDER_SITE_OTHER): Payer: Medicare HMO

## 2018-04-03 ENCOUNTER — Ambulatory Visit: Payer: Medicare HMO | Admitting: Orthopedic Surgery

## 2018-04-03 VITALS — BP 161/86 | HR 75 | Ht 65.5 in | Wt 213.0 lb

## 2018-04-03 DIAGNOSIS — G8929 Other chronic pain: Secondary | ICD-10-CM | POA: Diagnosis not present

## 2018-04-03 DIAGNOSIS — M25561 Pain in right knee: Secondary | ICD-10-CM | POA: Diagnosis not present

## 2018-04-03 DIAGNOSIS — Z96641 Presence of right artificial hip joint: Secondary | ICD-10-CM

## 2018-04-03 DIAGNOSIS — M25562 Pain in left knee: Secondary | ICD-10-CM

## 2018-04-03 NOTE — Progress Notes (Signed)
Patient ID: [name] [name], male   DOB: [birth year], 68 y.o.   MRN: [id number]  Chief Complaint  Patient presents with  . Follow-up    Recheck on right hip replacement, DOS 04-20-13    HPI [name] [name] is a 68 y.o. male.  Status post  total hip arthroplasty. The patient is doing well the hip implant is functioning well.  The patient says his right hip is functioning well.  However, he has pain in both knees right worse than left with one episode of locking of the right knee.  He notices that he has trouble bending squatting kneeling or carrying things in both hands often needing one hand to help him with support and balance  Left knee pain is diffuse throughout the joint right knee pain seems to be more medial than lateral both knees seem to have a dull persistent ache which is worse with activity  Severity scale at its worst 8 and usually 3-4 out of 10   Review of Systems Review of Systems  Constitutional: Negative for chills and fever.  Skin: Negative.   Neurological: Negative for numbness.  All other systems reviewed and are negative.   Constitutional symptoms no fever or chills Neurologic symptoms no numbness or tingling  Past Medical History:  Diagnosis Date  . Aortic atherosclerosis ([place]) 10/19/2016    Seen on chest x-ray July 2018  . Arthritis    right knee and hip   . Cancer [facility])    prostate cancer   . Chronic kidney disease    prostate cancer   . Diabetes mellitus without complication ([place])   . GERD (gastroesophageal reflux disease)   . Gout   . Gout   . Hypercholesteremia   . Hyperglycemia   . Hyperlipidemia   . Hypertension   . Status post THR (total hip replacement) 04/20/13 05/01/2013   Right total hip 04/20/2013 Dr. Lydia Guiles implant     Past Surgical History:  Procedure Laterality Date  . COLONOSCOPY    . COLONOSCOPY N/A 10/22/2013   Procedure: COLONOSCOPY;  Surgeon: Danie Binder, MD;  [facility];  Service: Endoscopy;   Laterality: N/A;  1115  . HERNIA REPAIR     right inguinal hernia repair   . NASAL SEPTUM SURGERY    . OTHER SURGICAL HISTORY     deviated septum surgery   . ROBOT ASSISTED LAPAROSCOPIC RADICAL PROSTATECTOMY  04/20/2011   Procedure: ROBOTIC ASSISTED LAPAROSCOPIC RADICAL PROSTATECTOMY;  Surgeon: Malka So, MD;  Location: [facility] OR[facility];  Service: Urology;  Laterality: N/A;  Bilateral lymph node dissection  . TOTAL HIP ARTHROPLASTY Right 04/20/2013   Procedure: TOTAL HIP ARTHROPLASTY;  Surgeon: Carole Civil, MD;  Location: [facility] ORS;  Service: Orthopedics;  Laterality: Right;     Allergies  Allergen Reactions  . Lipitor [Atorvastatin]     Myalgia     Current Outpatient Medications  Medication Sig Dispense Refill  . allopurinol (ZYLOPRIM) 100 MG tablet TAKE 1 TABLET EVERY DAY 90 tablet 0  . aspirin 81 MG tablet Take 81 mg by mouth daily.    [name] atorvastatin (LIPITOR) 40 MG tablet TAKE 1 TABLET EVERY DAY 90 tablet 0  . B Complex-C (SUPER B COMPLEX PO) Take 1 tablet by mouth daily.    . Calcium Carbonate-Vitamin D (CALCIUM-VITAMIN D) 500-200 MG-UNIT per tablet Take 1 tablet by mouth daily.    [name] lisinopril-hydrochlorothiazide (PRINZIDE,ZESTORETIC) 20-25 MG tablet Take 1 tablet by mouth daily. 90 tablet 1  .  Multiple Vitamin (MULTIVITAMIN) tablet Take 1 tablet by mouth daily.     . naproxen sodium (ANAPROX) 220 MG tablet Take 220 mg by mouth 2 (two) times daily with a meal.     . Omega-3 Fatty Acids (FISH OIL) 1000 MG CAPS Take by mouth.    . ranitidine (ZANTAC) 150 MG capsule Take 150 mg by mouth daily.     . vitamin E 400 UNIT capsule Take 400 Units by mouth daily.      No current facility-administered medications for this visit.      Physical Exam Blood pressure (!) 161/86, pulse 75, height 5' 5.5" (1.664 m), weight 213 lb (96.6 kg).  Overall appearance normal grooming and hygiene normal. The patient is awake alert and oriented 3 with a pleasant mood and affect. The patient is  ambulatory without assistive device and without limping.  RIGHT HIP: The skin incision is healed without any erythema or tenderness or neuroma. Hip flexion strength grade 5. Hip is stable to post pull and abduction stress test. Hip flexion is 120. There is no tenderness or swelling around the hip. Distal pulses are intact sensation is normal and there is no lymphadenopathy in the groin patient walks with normal balance and coordination  Right Knee Exam   Muscle Strength  The patient has normal right knee strength.  Tenderness  The patient is experiencing tenderness in the medial joint line and lateral joint line.  Range of Motion  Extension: normal  Flexion: 120   Tests  McMurray:  Medial - negative Lateral - negative Varus: negative Valgus: negative Drawer:  Anterior - negative    Posterior - negative  Other  Erythema: absent Scars: absent Sensation: normal Pulse: present Swelling: none Effusion: no effusion present   Left Knee Exam   Muscle Strength  The patient has normal left knee strength.  Tenderness  The patient is experiencing tenderness in the lateral joint line and medial joint line.  Range of Motion  Extension: normal  Flexion: 120   Tests  McMurray:  Medial - negative Lateral - negative Varus: negative Valgus: negative Drawer:  Anterior - negative     Posterior - negative  Other  Erythema: absent Scars: absent Sensation: normal Pulse: present Swelling: none Effusion: no effusion present       Data Reviewed X-rays were ordered today, separate report was dictated.  My interpretation of the x-ray today    Assessment Status post right total hip, stable Bilateral osteoarthritis of the knee  Right total knee   Plan Routine repeat x-ray right hip in one year  The procedure has been fully reviewed with the patient; The risks and benefits of surgery have been discussed and explained and understood. Alternative treatment has also been  reviewed, questions were encouraged and answered. The postoperative plan is also been reviewed.   1:44 PM [name], MD 04/03/2018

## 2018-04-03 NOTE — Patient Instructions (Signed)
You have decided to proceed with knee replacement surgery. You have decided not to continue with nonoperative measures such as but not limited to oral medication, weight loss, activity modification, physical therapy, bracing, or injection.  We will perform the procedure commonly known as total knee replacement. Some of the risks associated with knee replacement surgery include but are not limited to Bleeding Infection Swelling Stiffness Blood clot Pulmonary embolism  Loosening of the implant Pain that persists even after surgery  Infection is especially devastating complication of knee surgery although rare. If infection does occur your implant will usually have to be removed and several surgeries and antibiotics will be needed to eradicate the infection prior to performing a repeat replacement.   In some cases amputation is required to eradicate the infection. In other rare cases a knee fusion is needed   In compliance with recent [place] law in federal regulation regarding opioid use and abuse and addiction, we will taper (stop) opioid medication after 2 weeks.  If you're not comfortable with these risks and would like to continue with nonoperative treatment please let Dr. Deryck Hippler know prior to your surgery.  

## 2018-04-04 ENCOUNTER — Telehealth: Payer: Self-pay | Admitting: Family Medicine

## 2018-04-04 ENCOUNTER — Other Ambulatory Visit: Payer: Self-pay | Admitting: Family Medicine

## 2018-04-04 DIAGNOSIS — R7303 Prediabetes: Secondary | ICD-10-CM

## 2018-04-04 DIAGNOSIS — E785 Hyperlipidemia, unspecified: Secondary | ICD-10-CM

## 2018-04-04 DIAGNOSIS — I700 Atherosclerosis of aorta: Secondary | ICD-10-CM

## 2018-04-04 DIAGNOSIS — D649 Anemia, unspecified: Secondary | ICD-10-CM

## 2018-04-04 NOTE — Telephone Encounter (Signed)
Pt is having a total knee replacement on 05/16/18. Pt wants to know if Dr. [name] still wants him to have a stress test done before having surgery. Pt has follow up appt with Dr. [name] on 04/26/18. He is hoping if possible to have it done before his appt with Dr. [name] to be able to discuss results.

## 2018-04-04 NOTE — Telephone Encounter (Signed)
Contacted patient and informed him that provider would like patient to have labs. Provider would also like patient to have urgent consultation with cardiology for pre clearance for surgery. Informed patient that provider would like lab work and urgent referral to cardiology. Lab orders placed and referral put in. Pt verbalized understanding.

## 2018-04-04 NOTE — Progress Notes (Signed)
Cardiology Office Note   Date:  04/10/2018   ID:  Jonathin, Heinicke 07/02/1950, MRN [id number]  PCP:  Kathyrn Drown, MD  Cardiologist:   Jenkins Rouge, MD   No chief complaint on file.     History of Present Illness: [name] [name] is a 68 y.o. male who presents for consultation regarding pre operative clearance. Referred by Dr Wolfgang Phoenix He sees Dr Aline Brochure for his orthopedic needs. Had right THR in 2015 with no cardiac issues. Needs a right TKR. History of DM, HTN and HLD. CXR done 10/19/16 commented on aortic atherosclerosis.   He had been active prior to right knee getting worse last couple of months Able to do 5 METS and has never had any symptoms of chest pain dyspnea or palpitations. Reviewed his myovue done 1/17 and low risk with likely artifact in inferior wall from diaphragm and gut activity. Baseline ECG is normal. Previous anesthesia for hip surgery no complications  Past Medical History:  Diagnosis Date  . Aortic atherosclerosis ([place]) 10/19/2016    Seen on chest x-ray July 2018  . Arthritis    right knee and hip   . Cancer [facility])    prostate cancer   . Chronic kidney disease    prostate cancer   . Diabetes mellitus without complication ([place])   . GERD (gastroesophageal reflux disease)   . Gout   . Gout   . Hypercholesteremia   . Hyperglycemia   . Hyperlipidemia   . Hypertension   . Status post THR (total hip replacement) 04/20/13 05/01/2013   Right total hip 04/20/2013 Dr. Lydia Guiles implant     Past Surgical History:  Procedure Laterality Date  . COLONOSCOPY    . COLONOSCOPY N/A 10/22/2013   Procedure: COLONOSCOPY;  Surgeon: Danie Binder, MD;  [facility];  Service: Endoscopy;  Laterality: N/A;  1115  . HERNIA REPAIR     right inguinal hernia repair   . NASAL SEPTUM SURGERY    . OTHER SURGICAL HISTORY     deviated septum surgery   . ROBOT ASSISTED LAPAROSCOPIC RADICAL PROSTATECTOMY  04/20/2011   Procedure: ROBOTIC ASSISTED  LAPAROSCOPIC RADICAL PROSTATECTOMY;  Surgeon: Malka So, MD;  Location[facility];  Service: Urology;  Laterality: N/A;  Bilateral lymph node dissection  . TOTAL HIP ARTHROPLASTY Right 04/20/2013   Procedure: TOTAL HIP ARTHROPLASTY;  Surgeon: Carole Civil, MD;  Location: [facility];  Service: Orthopedics;  Laterality: Right;     Current Outpatient Medications  Medication Sig Dispense Refill  . allopurinol (ZYLOPRIM) 100 MG tablet TAKE 1 TABLET EVERY DAY 90 tablet 0  . aspirin 81 MG tablet Take 81 mg by mouth daily.    [name] atorvastatin (LIPITOR) 40 MG tablet TAKE 1 TABLET EVERY DAY 90 tablet 0  . B Complex-C (SUPER B COMPLEX PO) Take 1 tablet by mouth daily.    . Calcium Carbonate-Vitamin D (CALCIUM-VITAMIN D) 500-200 MG-UNIT per tablet Take 1 tablet by mouth daily.    [name] lisinopril-hydrochlorothiazide (PRINZIDE,ZESTORETIC) 20-25 MG tablet Take 1 tablet by mouth daily. 90 tablet 1  . Multiple Vitamin (MULTIVITAMIN) tablet Take 1 tablet by mouth daily.     . naproxen sodium (ANAPROX) 220 MG tablet Take 220 mg by mouth 2 (two) times daily with a meal.     . Omega-3 Fatty Acids (FISH OIL) 1000 MG CAPS Take by mouth.    . vitamin E 400 UNIT capsule Take 400 Units by mouth daily.  No current facility-administered medications for this visit.     Allergies:   Lipitor [atorvastatin]    Social History:  The patient  reports that he quit smoking about 34 years ago. His smoking use included cigarettes. He has a 4.00 pack-year smoking history. He has never used smokeless tobacco. He reports current alcohol use. He reports that he does not use drugs.   Family History:  The patient's family history includes Diabetes in his mother; Hypertension in his brother and mother.    ROS:  Please see the history of present illness.   Otherwise, review of systems are positive for none.   All other systems are reviewed and negative.    PHYSICAL EXAM: VS:  BP (!) 162/78 (BP Location: Right Arm)   Pulse 88    Ht 5\' 7"  (1.702 m)   Wt 213 lb (96.6 kg)   SpO2 97%   BMI 33.36 kg/m  , BMI Body mass index is 33.36 kg/m. Affect appropriate Healthy:  appears stated age 71: normal Neck supple with no adenopathy JVP normal left  bruits no thyromegaly Lungs clear with no wheezing and good diaphragmatic motion Heart:  S1/S2 SEM  murmur, no rub, gallop or click PMI normal Abdomen: benighn, BS positve, no tenderness, no AAA no bruit.  No HSM or HJR Distal pulses intact with no bruits No edema Neuro non-focal Skin warm and dry Post right THR    EKG:  2015 SR rate 93 nonspecific ST changes 04/10/18 SR rate 72 normal    Recent Labs: 10/17/2017: ALT 16 04/05/2018: BUN 14; Creatinine, Ser 1.09; Hemoglobin 13.8; Platelets 269; Potassium 5.1; Sodium 136    Lipid Panel    Component Value Date/Time   CHOL 158 04/05/2018 0820   TRIG 94 04/05/2018 0820   HDL 47 04/05/2018 0820   CHOLHDL 3.4 04/05/2018 0820   CHOLHDL 4.7 12/05/2013 0705   VLDL 48 (H) 12/05/2013 0705   LDLCALC 92 04/05/2018 0820      Wt Readings from Last 3 Encounters:  04/10/18 213 lb (96.6 kg)  04/03/18 213 lb (96.6 kg)  10/24/17 208 lb 6.4 oz (94.5 kg)      Other studies Reviewed: Additional studies/ records that were reviewed today include: Notes from ortho Dr Aline Brochure Notes from primary Dr Wolfgang Phoenix ECG, [place] labs .    ASSESSMENT AND PLAN:  1.  Preoperative Clearance: Multiple coronary risk factors with aortic atherosclerosis suggested on CXR. Moderate risk orthopedic surgery with activity limited by knee pain and previous right THR. Low risk myovue Clear to have surgery  February with Dr Aline Brochure at [place] 2. HTN: Well controlled.  Continue current medications and low sodium Dash type diet.   3. HLD:  Continue statin labs with primary  4. DM: Discussed low carb diet.  A1c borderline 5.7 on no meds  5. Bruit:  Left sided f/u carotid duplex likely plaque no high grade stenosis 6. Murmur:  Benign SEM no need for TTE     Current medicines are reviewed at length with the patient today.  The patient does not have concerns regarding medicines.  The following changes have been made:  no change  Labs/ tests ordered today include: carotid duplex   Orders Placed This Encounter  Procedures  . US Carotid Duplex Bilateral  . EKG 12-Lead     Disposition:   FU with cardiology 6 months      Signed, Jenkins Rouge, MD  04/10/2018 9:54 AM    [facility] [id number]  [address], [place], [place]  [address] Phone: [phone]; Fax: [phone]

## 2018-04-04 NOTE — Telephone Encounter (Signed)
Please advise. Thank you

## 2018-04-04 NOTE — Telephone Encounter (Signed)
Several things  I recommend lipid, metabolic 7, Q7V, CBC- other anemia, prediabetes, hyperlipidemia  I also recommend urgent consultation with cardiology Explained to the patient with his age and risk factors it is beneficial for him to do cardiac preclearance for surgery The cardiology group can order the stress test as well Please mark this as a urgent in order to try to complete this as soon as possible so that he can have clearance before his surgery  As for the lab work he can get this completed board before he does his follow-up with me in early February thank you

## 2018-04-05 ENCOUNTER — Telehealth: Payer: Self-pay

## 2018-04-05 DIAGNOSIS — R7303 Prediabetes: Secondary | ICD-10-CM | POA: Diagnosis not present

## 2018-04-05 DIAGNOSIS — I10 Essential (primary) hypertension: Secondary | ICD-10-CM

## 2018-04-05 DIAGNOSIS — E785 Hyperlipidemia, unspecified: Secondary | ICD-10-CM | POA: Diagnosis not present

## 2018-04-05 DIAGNOSIS — D649 Anemia, unspecified: Secondary | ICD-10-CM | POA: Diagnosis not present

## 2018-04-05 NOTE — Telephone Encounter (Signed)
-----   Message from Josue Hector, MD sent at 04/04/2018  6:08 PM EST ----- Regarding: Preoperative Clearance Can order lexiscan myovue to clear for surgery this week I am seeing him on Monday would be nice to already have it done

## 2018-04-05 NOTE — Telephone Encounter (Signed)
Called pt. No answer, left message for pt to return call. Order placed for [place].

## 2018-04-06 LAB — CBC WITH DIFFERENTIAL/PLATELET
BASOS: 1 %
Basophils Absolute: 0.1 10*3/uL (ref 0.0–0.2)
EOS (ABSOLUTE): 0.1 10*3/uL (ref 0.0–0.4)
Eos: 1 %
HEMATOCRIT: 40.2 % (ref 37.5–51.0)
Hemoglobin: 13.8 g/dL (ref 13.0–17.7)
Immature Grans (Abs): 0 10*3/uL (ref 0.0–0.1)
Immature Granulocytes: 0 %
Lymphocytes Absolute: 1.4 10*3/uL (ref 0.7–3.1)
Lymphs: 19 %
MCH: 29 pg (ref 26.6–33.0)
MCHC: 34.3 g/dL (ref 31.5–35.7)
MCV: 85 fL (ref 79–97)
Monocytes Absolute: 0.6 10*3/uL (ref 0.1–0.9)
Monocytes: 8 %
NEUTROS PCT: 71 %
Neutrophils Absolute: 5.2 10*3/uL (ref 1.4–7.0)
PLATELETS: 269 10*3/uL (ref 150–450)
RBC: 4.76 x10E6/uL (ref 4.14–5.80)
RDW: 13 % (ref 11.6–15.4)
WBC: 7.3 10*3/uL (ref 3.4–10.8)

## 2018-04-06 LAB — BASIC METABOLIC PANEL
BUN/Creatinine Ratio: 13 (ref 10–24)
BUN: 14 mg/dL (ref 8–27)
CO2: 25 mmol/L (ref 20–29)
Calcium: 9.1 mg/dL (ref 8.6–10.2)
Chloride: 96 mmol/L (ref 96–106)
Creatinine, Ser: 1.09 mg/dL (ref 0.76–1.27)
GFR calc Af Amer: 80 mL/min/{1.73_m2} (ref >59)
GFR calc non Af Amer: 69 mL/min/{1.73_m2} (ref >59)
GLUCOSE: 110 mg/dL — AB (ref 65–99)
Potassium: 5.1 mmol/L (ref 3.5–5.2)
Sodium: 136 mmol/L (ref 134–144)

## 2018-04-06 LAB — HEMOGLOBIN A1C
Est. average glucose Bld gHb Est-mCnc: 123 mg/dL
Hgb A1c MFr Bld: 5.9 % — ABNORMAL HIGH (ref 4.8–5.6)

## 2018-04-06 LAB — LIPID PANEL
Chol/HDL Ratio: 3.4 ratio (ref 0.0–5.0)
Cholesterol, Total: 158 mg/dL (ref 100–199)
HDL: 47 mg/dL (ref >39)
LDL Calculated: 92 mg/dL (ref 0–99)
Triglycerides: 94 mg/dL (ref 0–149)
VLDL CHOLESTEROL CAL: 19 mg/dL (ref 5–40)

## 2018-04-07 ENCOUNTER — Encounter (HOSPITAL_COMMUNITY)
Admission: RE | Admit: 2018-04-07 | Discharge: 2018-04-07 | Disposition: A | Discharge status: Home / Self Care | Payer: Medicare HMO | Source: Ambulatory Visit | Attending: Cardiovascular Disease | Admitting: Cardiovascular Disease

## 2018-04-07 ENCOUNTER — Encounter (HOSPITAL_COMMUNITY): Payer: Self-pay

## 2018-04-07 ENCOUNTER — Ambulatory Visit (HOSPITAL_COMMUNITY)
Admission: RE | Admit: 2018-04-07 | Discharge: 2018-04-07 | Disposition: A | Discharge status: Home / Self Care | Payer: Medicare HMO | Source: Ambulatory Visit | Attending: Cardiovascular Disease | Admitting: Cardiovascular Disease

## 2018-04-07 DIAGNOSIS — Z01810 Encounter for preprocedural cardiovascular examination: Secondary | ICD-10-CM

## 2018-04-07 DIAGNOSIS — I10 Essential (primary) hypertension: Secondary | ICD-10-CM | POA: Diagnosis not present

## 2018-04-07 LAB — NM MYOCAR MULTI W/SPECT W/WALL MOTION / EF
LV dias vol: 99 mL (ref 62–150)
LVSYSVOL: 40 mL
Peak HR: 98 {beats}/min
RATE: 0.34
Rest HR: 64 {beats}/min
SDS: 0
SRS: 3
SSS: 3
TID: 1.23

## 2018-04-07 MED ORDER — SODIUM CHLORIDE 0.9% FLUSH
INTRAVENOUS | Status: AC
  Administered 2018-04-07: 10 mL via INTRAVENOUS
  Filled 2018-04-07: qty 10

## 2018-04-07 MED ORDER — TECHNETIUM TC 99M TETROFOSMIN IV KIT
10.0000 | PACK | Freq: Once | INTRAVENOUS | Status: AC | PRN
  Administered 2018-04-07: 10.25 via INTRAVENOUS

## 2018-04-07 MED ORDER — TECHNETIUM TC 99M TETROFOSMIN IV KIT
30.0000 | PACK | Freq: Once | INTRAVENOUS | Status: AC | PRN
  Administered 2018-04-07: 32 via INTRAVENOUS

## 2018-04-07 MED ORDER — REGADENOSON 0.4 MG/5ML IV SOLN
INTRAVENOUS | Status: AC
  Administered 2018-04-07: 0.4 mg via INTRAVENOUS
  Filled 2018-04-07: qty 5

## 2018-04-10 ENCOUNTER — Ambulatory Visit: Payer: Medicare HMO | Admitting: Cardiovascular Disease

## 2018-04-10 ENCOUNTER — Encounter: Payer: Self-pay | Admitting: Cardiovascular Disease

## 2018-04-10 ENCOUNTER — Telehealth: Payer: Self-pay | Admitting: Cardiovascular Disease

## 2018-04-10 VITALS — BP 162/78 | HR 88 | Ht 67.0 in | Wt 213.0 lb

## 2018-04-10 DIAGNOSIS — R0989 Other specified symptoms and signs involving the circulatory and respiratory systems: Secondary | ICD-10-CM | POA: Diagnosis not present

## 2018-04-10 DIAGNOSIS — I10 Essential (primary) hypertension: Secondary | ICD-10-CM

## 2018-04-10 NOTE — Patient Instructions (Signed)
Medication Instructions:  Your physician recommends that you continue on your current medications as directed. Please refer to the Current Medication list given to you today.  If you need a refill on your cardiac medications before your next appointment, please call your pharmacy.   Lab work: NONE  If you have labs (blood work) drawn today and your tests are completely normal, you will receive your results only by: [name] MyChart Message (if you have MyChart) OR . A paper copy in the mail If you have any lab test that is abnormal or we need to change your treatment, we will call you to review the results.  Testing/Procedures: Your physician has requested that you have a carotid duplex. This test is an ultrasound of the carotid arteries in your neck. It looks at blood flow through these arteries that supply the brain with blood. Allow one hour for this exam. There are no restrictions or special instructions.    Follow-Up: At [facility], you and your health needs are our priority.  As part of our continuing mission to provide you with exceptional heart care, we have created designated Provider Care Teams.  These Care Teams include your primary Cardiologist (physician) and Advanced Practice Providers (APPs -  Physician Assistants and Nurse Practitioners) who all work together to provide you with the care you need, when you need it. You will need a follow up appointment in 6 months.  Please call our office 2 months in advance to schedule this appointment.  You may see No primary care provider on file. or one of the following Advanced Practice Providers on your designated Care Team:   [place], PA-C [facility]) . Ermalinda Barrios, PA-C ([place])  Any Other Special Instructions Will Be Listed Below (If Applicable).    Thank you for choosing [facility]!

## 2018-04-10 NOTE — Telephone Encounter (Signed)
°  Precert needed for: Carotid    Location: [facility]    Date: Jan 27.2020 @ 8:30  Arrive 8:15

## 2018-04-17 ENCOUNTER — Ambulatory Visit (HOSPITAL_COMMUNITY)
Admission: RE | Admit: 2018-04-17 | Discharge: 2018-04-17 | Disposition: A | Discharge status: Home / Self Care | Payer: Medicare HMO | Source: Ambulatory Visit | Attending: Cardiovascular Disease | Admitting: Cardiovascular Disease

## 2018-04-17 DIAGNOSIS — I6523 Occlusion and stenosis of bilateral carotid arteries: Secondary | ICD-10-CM | POA: Diagnosis not present

## 2018-04-17 DIAGNOSIS — R0989 Other specified symptoms and signs involving the circulatory and respiratory systems: Secondary | ICD-10-CM | POA: Insufficient documentation

## 2018-04-26 ENCOUNTER — Ambulatory Visit (INDEPENDENT_AMBULATORY_CARE_PROVIDER_SITE_OTHER): Payer: Medicare HMO | Admitting: Family Medicine

## 2018-04-26 ENCOUNTER — Encounter: Payer: Self-pay | Admitting: Family Medicine

## 2018-04-26 VITALS — BP 124/78 | Ht 65.5 in | Wt 213.0 lb

## 2018-04-26 DIAGNOSIS — E785 Hyperlipidemia, unspecified: Secondary | ICD-10-CM

## 2018-04-26 DIAGNOSIS — R7303 Prediabetes: Secondary | ICD-10-CM | POA: Diagnosis not present

## 2018-04-26 DIAGNOSIS — I10 Essential (primary) hypertension: Secondary | ICD-10-CM

## 2018-04-26 MED ORDER — LISINOPRIL-HYDROCHLOROTHIAZIDE 20-25 MG PO TABS: 1.0000 | ORAL_TABLET | Freq: Every day | ORAL | 1 refills | Status: DC

## 2018-04-26 MED ORDER — ALLOPURINOL 100 MG PO TABS: 100.0000 mg | ORAL_TABLET | Freq: Every day | ORAL | 1 refills | Status: DC

## 2018-04-26 MED ORDER — ATORVASTATIN CALCIUM 40 MG PO TABS: 40.0000 mg | ORAL_TABLET | Freq: Every day | ORAL | 1 refills | Status: DC

## 2018-04-26 NOTE — Progress Notes (Signed)
   Subjective:    Patient ID: [name] [name], male    DOB: 1950/03/29, 68 y.o.   MRN: [id number]  HPI Patient is here today to follow up on his chronic health issues.  Hypertension:Patient takes Lisinopril -Hctz 20-25 once per day  Hyperlipidemia: Takes Lipitor 40 mg once per day  Gout: Allopurinol 100 mg once per day. Patient for blood pressure check up.  The patient does have hypertension.  The patient is on medication.  Patient relates compliance with meds. Todays BP reviewed with the patient. Patient denies issues with medication. Patient relates reasonable diet. Patient tries to minimize salt. Patient aware of BP goals.  Patient here for follow-up regarding cholesterol.  The patient does have hyperlipidemia.  Patient does try to maintain a reasonable diet.  Patient does take the medication on a regular basis.  Denies missing a dose.  The patient denies any obvious side effects.  Prior blood work results reviewed with the patient.  The patient is aware of his cholesterol goals and the need to keep it under good control to lessen the risk of disease.   He eats healthy and sees urology and cardiology, orthopedics.  Review of Systems  Constitutional: Negative for diaphoresis and fatigue.  HENT: Negative for congestion and rhinorrhea.   Respiratory: Negative for cough and shortness of breath.   Cardiovascular: Negative for chest pain and leg swelling.  Gastrointestinal: Negative for abdominal pain and diarrhea.  Skin: Negative for color change and rash.  Neurological: Negative for dizziness and headaches.  Psychiatric/Behavioral: Negative for behavioral problems and confusion.       Objective:   Physical Exam Vitals signs reviewed.  Constitutional:      General: He is not in acute distress. HENT:     Head: Normocephalic and atraumatic.  Eyes:     General:        Right eye: No discharge.        Left eye: No discharge.  Neck:     Trachea: No tracheal deviation.    Cardiovascular:     Rate and Rhythm: Normal rate and regular rhythm.     Heart sounds: Normal heart sounds. No murmur.  Pulmonary:     Effort: Pulmonary effort is normal. No respiratory distress.     Breath sounds: Normal breath sounds.  Lymphadenopathy:     Cervical: No cervical adenopathy.  Skin:    General: Skin is warm and dry.  Neurological:     Mental Status: He is alert.     Coordination: Coordination normal.  Psychiatric:        Behavior: Behavior normal.           Assessment & Plan:  Prediabetes under good control watch diet try to lose weight  Has right knee osteoarthritis plans on having total knee replacement later this month I believe the patient should do well but I did encourage him to talk with the surgeon to discuss avoiding blood clots and also discussed rehab afterwards  Hyperlipidemia continue current measures watch diet  Blood pressure good control continue current measures  Follow-up in the summer for a complete wellness checkup and chronic health checkup

## 2018-05-05 ENCOUNTER — Other Ambulatory Visit: Payer: Self-pay | Admitting: Orthopedic Surgery

## 2018-05-05 ENCOUNTER — Telehealth: Payer: Self-pay | Admitting: Radiology

## 2018-05-05 DIAGNOSIS — M1711 Unilateral primary osteoarthritis, right knee: Secondary | ICD-10-CM

## 2018-05-05 NOTE — Telephone Encounter (Signed)
Called [place] auth not required for the total knee [id number] per Johnney Ou. Reference number for the call is [id number]

## 2018-05-09 NOTE — Patient Instructions (Signed)
[name] [name]  05/09/2018     @PREFPERIOPPHARMACY @   Your procedure is scheduled on  05/16/2018.  Report to [facility] at  615   A.M.  Call this number if you have problems the morning of surgery:  [phone]   Remember:  Do not eat or drink after midnight.                         Take these medicines the morning of surgery with A SIP OF WATER  Allopurinol.    Do not wear jewelry, make-up or nail polish.  Do not wear lotions, powders, or perfumes, or deodorant.  Do not shave 48 hours prior to surgery.  Men may shave face and neck.  Do not bring valuables to the hospital.  [facility] is not responsible for any belongings or valuables.  Contacts, dentures or bridgework may not be worn into surgery.  Leave your suitcase in the car.  After surgery it may be brought to your room.  For patients admitted to the hospital, discharge time will be determined by your treatment team.  Patients discharged the day of surgery will not be allowed to drive home.   Name and phone number of your driver:   family Special instructions:  None  Please read over the following fact sheets that you were given. Pain Booklet, Coughing and Deep Breathing, Blood Transfusion Information, Total Joint Packet, MRSA Information, Surgical Site Infection Prevention, Anesthesia Post-op Instructions and Care and Recovery After Surgery       Total Knee Replacement Total knee replacement is a procedure to replace the knee joint with an artificial (prosthetic) knee joint. The purpose of this surgery is to reduce knee pain and improve knee function. The prosthetic knee joint (prosthesis) may be made of metal, plastic or ceramic. It replaces parts of the thigh bone (femur), lower leg bone (tibia), and kneecap (patella) that are removed during the procedure. Tell a health care provider about:  Any allergies you have.  All medicines you are taking, including vitamins, herbs, eye drops,  creams, and over-the-counter medicines.  Any problems you or family members have had with anesthetic medicines.  Any blood disorders you have.  Any surgeries you have had.  Any medical conditions you have.  Whether you are pregnant or may be pregnant. What are the risks? Generally, this is a safe procedure. However, problems may occur, including:  Infection.  Bleeding.  Allergic reactions to medicines.  Damage to other structures or organs.  Decreased range of motion of the knee.  Instability of the knee.  Loosening of the prosthetic joint.  Knee pain that does not go away (chronic pain). What happens before the procedure? Staying hydrated Follow instructions from your health care provider about hydration, which may include:  Up to 2 hours before the procedure - you may continue to drink clear liquids, such as water, clear fruit juice, black coffee, and plain tea.  Eating and drinking restrictions Follow instructions from your health care provider about eating and drinking, which may include:  8 hours before the procedure - stop eating heavy meals or foods such as meat, fried foods, or fatty foods.  6 hours before the procedure - stop eating light meals or foods, such as toast or cereal.  6 hours before the procedure - stop drinking milk or drinks that contain milk.  2 hours before the procedure -  stop drinking clear liquids. Medicines  Ask your health care provider about: ? Changing or stopping your regular medicines. This is especially important if you are taking diabetes medicines or blood thinners. ? Taking medicines such as aspirin and ibuprofen. These medicines can thin your blood. Do not take these medicines before your procedure if your health care provider instructs you not to.  You may be given antibiotic medicine to help prevent infection. General instructions  Have dental care and routine cleanings completed before your procedure. Plan to not have  dental work done for 3 months after your procedure. Germs from anywhere in your body, including your mouth, can travel to your new joint and infect it.  Ask your health care provider how your surgical site will be marked or identified.  If your health care provider prescribes physical therapy, do exercises as instructed.  Do not use any tobacco products, such as cigarettes, chewing tobacco, or e-cigarettes. If you need help quitting, ask your health care provider.  You may have a physical exam.  You may have tests, such as: ? X-rays. ? MRI. ? CT scan. ? Bone scans.  You may have a blood or urine sample taken.  Plan to have someone take you home after the procedure.  If you will be going home right after the procedure, plan to have someone with you for at least 24 hours. It is recommended that you have someone to help care for you for at least 4-6 weeks after your procedure. What happens during the procedure?   To reduce your risk of infection: ? Your health care team will wash or sanitize their hands. ? Your skin will be washed with soap.  An IV tube will be inserted into one of your veins.  You will be given one or more of the following: ? A medicine to help you relax (sedative). ? A medicine to numb the area (local anesthetic). ? A medicine to make you fall asleep (general anesthetic). ? A medicine that is injected into your spine to numb the area below and slightly above the injection site (spinal anesthetic). ? A medicine that is injected into an area of your body to numb everything below the injection site (regional anesthetic).  An incision will be made in your knee.  Damaged cartilage and bone will be removed from your femur, tibia, and patella.  Parts of the prosthesis (liners) will be placed over the areas of bone and cartilage that were removed. A metal liner will be placed over your femur, and plastic liners will be placed over your tibia and the underside of your  patella.  One or more small tubes (drains) may be placed near your incision to help drain extra fluid from your surgical site.  Your incision will be closed with stitches (sutures), skin glue, or adhesive strips. Medicine may be applied to your incision.  A bandage (dressing) will be placed over your incision. The procedure may vary among health care providers and hospitals. What happens after the procedure?  Your blood pressure, heart rate, breathing rate, and blood oxygen level will be monitored often until the medicines you were given have worn off.  You may continue to receive fluids and medicines through an IV tube.  You will have some pain. Pain medicines will be available to help you.  You may have fluid coming from one or more drains in your incision.  You may have to wear compression stockings. These stockings help to prevent blood clots and  reduce swelling in your legs.  You will be encouraged to move around as much as possible.  You may be given a continuous passive motion machine to use at home. You will be shown how to use this machine.  Do not drive for 24 hours if you received a sedative. This information is not intended to replace advice given to you by your health care provider. Make sure you discuss any questions you have with your health care provider. Document Released: 06/14/2000 Document Revised: 08/26/2016 Document Reviewed: 02/12/2015 Elsevier Interactive Patient Education  2019 [place].  Total Knee Replacement, Care After Refer to this sheet in the next few weeks. These instructions provide you with information about caring for yourself after your procedure. Your health care provider may also give you more specific instructions. Your treatment has been planned according to current medical practices, but problems sometimes occur. Call your health care provider if you have any problems or questions after your procedure. What can I expect after the  procedure? After the procedure, it is common to have:  Pain and swelling.  A small amount of blood or clear fluid coming from your incision.  Limited range of motion. Follow these instructions at home: Medicines  Take over-the-counter and prescription medicines only as told by your health care provider.  If you were prescribed an antibiotic medicine, take it as told by your health care provider. Do not stop taking the antibiotic even if you start to feel better.  If you were prescribed a blood thinner (anticoagulant), take it as told by your health care provider. Bathing  Do not take baths, swim, or use a hot tub until your health care provider approves. Ask your health care provider if you can take showers. You may only be allowed to take sponge baths for bathing.  If you have an immobilizer that is not waterproof, cover it with a watertight covering when you take a bath or shower.  Keep your bandage (dressing) dry until your health care provider says it can be removed. Incision care and drain care   Check your incision area and drain site every day for signs of infection. Check for: ? More redness, swelling, or pain. ? More fluid or blood. ? Warmth. ? Pus or a bad smell.  Follow instructions from your health care provider about how to take care of your incision. Make sure you: ? Wash your hands with soap and water before you change your dressing. If soap and water are not available, use alcohol-based hand sanitizer. ? Change your dressing as told by your health care provider. ? Leave stitches (sutures), skin glue, or adhesive strips in place. These skin closures may need to stay in place for 2 weeks or longer. If adhesive strip edges start to loosen and curl up, you may trim the loose edges. Do not remove adhesive strips completely unless your health care provider tells you to do that.  If you have a drain, follow instructions from your health care provider about caring for it.  Do not remove the drain tube or any dressings around the tube opening unless your health care provider approves. Managing pain, stiffness, and swelling      If directed, put ice on your knee. ? Put ice in a plastic bag or use the icing device (cold flow pad or cryocuff) that you were given. Follow instructions from your health care provider about how to use the icing device. ? Place a towel between your skin and the  bag or between your skin and the icing device. ? Leave the ice on for 20 minutes, 2-3 times per day.  If directed, apply heat to the affected area as often as told by your health care provider. Use the heat source that your health care provider recommends, such as a moist heat pack or a heating pad. ? Place a towel between your skin and the heat source. ? Leave the heat on for 20-30 minutes. ? Remove the heat if your skin turns bright red. This is especially important if you are unable to feel pain, heat, or cold. You may have a greater risk of getting burned.  Move your toes often to avoid stiffness and to lessen swelling.  Raise (elevate) your knee above the level of your heart while you are sitting or lying down.  Wear elastic knee support for as long as told by your health care provider. Driving   Do not drive until your health care provider approves. Ask your health care provider when it is safe to drive if you have an immobilizer on your knee.  Do not drive or operate heavy machinery while taking prescription pain medicine.  Do not drive for 24 hours if you received a sedative. Activity  Do not play contact sports until your health care provider approves.  Avoid high-impact activities, including running, jumping rope, and jumping jacks.  Avoid sitting for a long time without moving. Get up and move around at least every few hours.  If physical therapy was prescribed, do exercises as told by your health care provider.  Return to your normal activities as told  by your health care provider. Ask your health care provider what activities are safe for you. Safety  Do not use your leg to support your body weight until your health care provider approves. Use crutches or a walker as told by your health care provider. General instructions  Do not have any dental work done for at least 3 months after your surgery. When you do have dental work done, tell your dentist about your joint replacement.  Do not use any tobacco products, such as cigarettes, chewing tobacco, or e-cigarettes. If you need help quitting, ask your health care provider.  Wear compression stockings as told by your health care provider. These stockings help to prevent blood clots and reduce swelling in your legs.  If you have been sent home with a continuous passive motion machine, use it as told by your health care provider.  Drink enough fluid to keep your urine clear or pale yellow.  If you have been instructed to lose weight, follow instructions from your health care provider about how to do this safely.  Keep all follow-up visits as told by your health care provider. This is important. Contact a health care provider if:  You have more redness, swelling, or pain around your incision or drain.  You have more fluid or blood coming from your incision or drain.  Your incision or drain site feels warm to the touch.  You have pus or a bad smell coming from your incision or drain.  You have a fever.  Your incision breaks open after your health care provider removes your sutures, skin glue, or adhesive tape.  Your prosthesis feels loose.  You have knee pain that does not go away. Get help right away if:  You have a rash.  You have pain or swelling in your calf or thigh.  You have shortness of breath or difficulty  breathing.  You have chest pain.  Your range of motion in your knee is getting worse. This information is not intended to replace advice given to you by your  health care provider. Make sure you discuss any questions you have with your health care provider. Document Released: 09/25/2004 Document Revised: 08/01/2016 Document Reviewed: 02/12/2015 Elsevier Interactive Patient Education  2019 [place] Anesthesia, Adult General anesthesia is the use of medicines to make a person "go to sleep" (unconscious) for a medical procedure. General anesthesia must be used for certain procedures, and is often recommended for procedures that:  Last a long time.  Require you to be still or in an unusual position.  Are major and can cause blood loss. The medicines used for general anesthesia are called general anesthetics. As well as making you unconscious for a certain amount of time, these medicines:  Prevent pain.  Control your blood pressure.  Relax your muscles. Tell a health care provider about:  Any allergies you have.  All medicines you are taking, including vitamins, herbs, eye drops, creams, and over-the-counter medicines.  Any problems you or family members have had with anesthetic medicines.  Types of anesthetics you have had in the past.  Any blood disorders you have.  Any surgeries you have had.  Any medical conditions you have.  Any recent upper respiratory, chest, or ear infections.  Any history of: ? Heart or lung conditions, such as heart failure, sleep apnea, asthma, or chronic obstructive pulmonary disease (COPD). ? [employer] forces logistics/support/administrative officer. ? Depression or anxiety.  Any tobacco or drug use, including marijuana or alcohol use.  Whether you are pregnant or may be pregnant. What are the risks? Generally, this is a safe procedure. However, problems may occur, including:  Allergic reaction.  Lung and heart problems.  Inhaling food or liquid from the stomach into the lungs (aspiration).  Nerve injury.  Dental injury.  Air in the bloodstream, which can lead to stroke.  Extreme agitation or confusion (delirium)  when you wake up from the anesthetic.  Waking up during your procedure and being unable to move. This is rare. These problems are more likely to develop if you are having a major surgery or if you have an advanced or serious medical condition. You can prevent some of these complications by answering all of your health care provider's questions thoroughly and by following all instructions before your procedure. General anesthesia can cause side effects, including:  Nausea or vomiting.  A sore throat from the breathing tube.  Hoarseness.  Wheezing or coughing.  Shaking chills.  Tiredness.  Body aches.  Anxiety.  Sleepiness or drowsiness.  Confusion or agitation. What happens before the procedure? Staying hydrated Follow instructions from your health care provider about hydration, which may include:  Up to 2 hours before the procedure - you may continue to drink clear liquids, such as water, clear fruit juice, black coffee, and plain tea.  Eating and drinking restrictions Follow instructions from your health care provider about eating and drinking, which may include:  8 hours before the procedure - stop eating heavy meals or foods such as meat, fried foods, or fatty foods.  6 hours before the procedure - stop eating light meals or foods, such as toast or cereal.  6 hours before the procedure - stop drinking milk or drinks that contain milk.  2 hours before the procedure - stop drinking clear liquids. Medicines Ask your health care provider about:  Changing or stopping your regular  medicines. This is especially important if you are taking diabetes medicines or blood thinners.  Taking medicines such as aspirin and ibuprofen. These medicines can thin your blood. Do not take these medicines unless your health care provider tells you to take them.  Taking over-the-counter medicines, vitamins, herbs, and supplements. Do not take these during the week before your procedure  unless your health care provider approves them. General instructions  Starting 3-6 weeks before the procedure, do not use any products that contain nicotine or tobacco, such as cigarettes and e-cigarettes. If you need help quitting, ask your health care provider.  If you brush your teeth on the morning of the procedure, make sure to spit out all of the toothpaste.  Tell your health care provider if you become ill or develop a cold, cough, or fever.  If instructed by your health care provider, bring your sleep apnea device with you on the day of your surgery (if applicable).  Ask your health care provider if you will be going home the same day, the following day, or after a longer hospital stay. ? Plan to have someone take you home from the hospital or clinic. ? Plan to have a responsible adult care for you for at least 24 hours after you leave the hospital or clinic. This is important. What happens during the procedure?   You will be given anesthetics through both of the following: ? A mask placed over your nose and mouth. ? An IV in one of your veins.  You may receive a medicine to help you relax (sedative).  After you are unconscious, a breathing tube may be inserted down your throat to help you breathe. This will be removed before you wake up.  An anesthesia specialist will stay with you throughout your procedure. He or she will: ? Keep you comfortable and safe by continuing to give you medicines and adjusting the amount of medicine that you get. ? Monitor your blood pressure, pulse, and oxygen levels to make sure that the anesthetics do not cause any problems. The procedure may vary among health care providers and hospitals. What happens after the procedure?  Your blood pressure, temperature, heart rate, breathing rate, and blood oxygen level will be monitored until the medicines you were given have worn off.  You will wake up in a recovery area. You may wake up slowly.  If you  feel anxious or agitated, you may be given medicine to help you calm down.  If you will be going home the same day, your health care provider may check to make sure you can walk, drink, and urinate.  Your health care provider will treat any pain or side effects you have before you go home.  Do not drive for 24 hours if you were given a sedative. Summary  General anesthesia is used to keep you still and prevent pain during a procedure.  It is important to tell your health care provider about your medical history and any surgeries you have had, and previous experience with anesthesia.  Follow your health care provider's instructions about when to stop eating, drinking, or taking certain medicines before your procedure.  Plan to have someone take you home from the hospital or clinic. This information is not intended to replace advice given to you by your health care provider. Make sure you discuss any questions you have with your health care provider. Document Released: 06/15/2007 Document Revised: 07/26/2017 Document Reviewed: 10/22/2016 Elsevier Interactive Patient Education  2019 [employer]  General Anesthesia, Adult, Care After This sheet gives you information about how to care for yourself after your procedure. Your health care provider may also give you more specific instructions. If you have problems or questions, contact your health care provider. What can I expect after the procedure? After the procedure, the following side effects are common:  Pain or discomfort at the IV site.  Nausea.  Vomiting.  Sore throat.  Trouble concentrating.  Feeling cold or chills.  Weak or tired.  Sleepiness and fatigue.  Soreness and body aches. These side effects can affect parts of the body that were not involved in surgery. Follow these instructions at home:  For at least 24 hours after the procedure:  Have a responsible adult stay with you. It is important to have someone help  care for you until you are awake and alert.  Rest as needed.  Do not: ? Participate in activities in which you could fall or become injured. ? Drive. ? Use heavy machinery. ? Drink alcohol. ? Take sleeping pills or medicines that cause drowsiness. ? Make important decisions or sign legal documents. ? Take care of children on your own. Eating and drinking  Follow any instructions from your health care provider about eating or drinking restrictions.  When you feel hungry, start by eating small amounts of foods that are soft and easy to digest (bland), such as toast. Gradually return to your regular diet.  Drink enough fluid to keep your urine pale yellow.  If you vomit, rehydrate by drinking water, juice, or clear broth. General instructions  If you have sleep apnea, surgery and certain medicines can increase your risk for breathing problems. Follow instructions from your health care provider about wearing your sleep device: ? Anytime you are sleeping, including during daytime naps. ? While taking prescription pain medicines, sleeping medicines, or medicines that make you drowsy.  Return to your normal activities as told by your health care provider. Ask your health care provider what activities are safe for you.  Take over-the-counter and prescription medicines only as told by your health care provider.  If you smoke, do not smoke without supervision.  Keep all follow-up visits as told by your health care provider. This is important. Contact a health care provider if:  You have nausea or vomiting that does not get better with medicine.  You cannot eat or drink without vomiting.  You have pain that does not get better with medicine.  You are unable to pass urine.  You develop a skin rash.  You have a fever.  You have redness around your IV site that gets worse. Get help right away if:  You have difficulty breathing.  You have chest pain.  You have blood in your  urine or stool, or you vomit blood. Summary  After the procedure, it is common to have a sore throat or nausea. It is also common to feel tired.  Have a responsible adult stay with you for the first 24 hours after general anesthesia. It is important to have someone help care for you until you are awake and alert.  When you feel hungry, start by eating small amounts of foods that are soft and easy to digest (bland), such as toast. Gradually return to your regular diet.  Drink enough fluid to keep your urine pale yellow.  Return to your normal activities as told by your health care provider. Ask your health care provider what activities are safe for you. This  information is not intended to replace advice given to you by your health care provider. Make sure you discuss any questions you have with your health care provider. Document Released: 06/14/2000 Document Revised: 10/22/2016 Document Reviewed: 10/22/2016 Elsevier Interactive Patient Education  2019 [place].  Spinal Anesthesia and Epidural Anesthesia Spinal anesthesia and epidural anesthesia are methods of numbing the body. They are done by injecting numbing medicine (anesthetic) into the back, near the spinal cord. Spinal anesthesia is usually done to numb an area at and below the place where the injection is made. It is often used during surgeries of the pelvis, hips, legs, and lower abdomen. It begins to work almost immediately. Epidural anesthesia may be done to numb an area above or below the area where the injection is made. It is often used during childbirth and after major abdominal or chest surgery. It begins to work after 10-20 minutes. Tell a health care provider about:  Any allergies you have.  All medicines you are taking, including vitamins, herbs, eye drops, creams, and over-the-counter medicines.  Any problems you or family members have had with anesthetic medicines.  Any blood disorders you have.  Any surgeries  you have had.  Any medical conditions you have.  Whether you are pregnant or may be pregnant.  Any recent alcohol, tobacco, or drug use. What are the risks? Generally, this is a safe procedure. However, problems may occur, including:  Headache.  A drop in blood pressure. In some cases, this can lead to a heart attack or a stroke.  Nerve damage.  Infection.  Allergic reaction to medicines.  Seizures.  Spinal fluid leak.  Bleeding around the injection site.  Inability to breathe. If this happens, a tube may be put into your windpipe (trachea) and a machine may be used to help you breathe until the anesthetic wears off.  Long-lasting numbness, pain, or loss of function of body parts.  Nausea and vomiting.  Dizziness and fainting.  Shivering.  Itching. What happens before the procedure? Staying hydrated Follow instructions from your health care provider about hydration, which may include:  Up to 2 hours before the procedure - you may continue to drink clear liquids, such as water, clear fruit juice, black coffee, and plain tea.  Eating and drinking restrictions Follow instructions from your health care provider about eating and drinking, which may include:  8 hours before the procedure - stop eating heavy meals or foods such as meat, fried foods, or fatty foods.  6 hours before the procedure - stop eating light meals or foods, such as toast or cereal.  6 hours before the procedure - stop drinking milk or drinks that contain milk.  2 hours before the procedure - stop drinking clear liquids. Medicine Ask your health care provider about:  Changing or stopping your regular medicines. This is especially important if you are taking diabetes medicines or blood thinners.  Changing or stopping your dietary supplements.  Taking medicines such as aspirin and ibuprofen. These medicines can thin your blood. Do not take these medicines before your procedure if your health  care provider instructs you not to. General instructions   Do not use any tobacco products, such as cigarettes, chewing tobacco, and electronic cigarettes, for as long as possible before your procedure. If you need help quitting, ask your health care provider.  If you use a sleep apnea device, ask your health care provider whether you should bring it with you on the day of your surgery.  Ask your health care provider if you will have to stay overnight at the hospital or clinic.  If you will not have to stay overnight: ? Plan to have someone take you home from the hospital or clinic. ? Plan to have a responsible adult care for you for at least 24 hours after you leave the hospital or clinic. This is important. What happens during the procedure?   A health care provider will put patches on your chest, a cuff around your arm, or a sensor device on your finger. These will be attached to monitors that allow your health care provider to watch your blood pressure, pulse, and oxygen levels to make sure that the anesthetic does not cause any problems.  An IV tube may be inserted into one of your veins. The tube will be used to give you fluids and medicines throughout the procedure as needed.  You may be given a medicine to help you relax (sedative).  You will be asked to sit up or to lie on your side with your knees and your chin bent toward your chest. These positions open up the space between the bones in your back, making it easier to inject the medicine.  The area of your back where the medicine will be injected will be cleaned.  A medicine called a local anesthetic may be injected to numb the area where the spinal or epidural anesthetic will be injected.  A needle will be inserted between the bones of your back. While this is being done: ? Continue to breathe normally. ? Stay as still and quiet as you can. ? If you feel a tingling shock or pain going down your leg, tell your health care  provider but try not to move.  The spinal or epidural anesthetic will be injected.  If you receive an epidural anesthetic and need more than one dose, a tiny, flexible tube (catheter) will be placed where the anesthetic was injected. Additional doses will be given through the catheter. If you need pain medicine after the procedure, the catheter will be kept in place.  If an epidural catheter has not been placed in your injection site or left there, a small bandage (dressing) will be placed over the injection site. The procedure may vary among health care providers and hospitals. What happens after the procedure?  You will need to stay in bed until your health care provider says it safe for you to walk.  Your blood pressure, heart rate, breathing rate, and blood oxygen level will be monitored until the medicines you were given have worn off.  If you have a catheter, it will be removed when it is no longer needed.  Do not drive for 24 hours if you received a sedative.  It is common to have nausea and itching. There are medicines that can help with these side effects. It is also common to have: ? Sleepiness. ? Vomiting. ? Numbness or tingling in your legs. ? Trouble urinating. Summary  Spinal anesthesia and epidural anesthesia are methods of numbing the body.  Tell your health care provider about any allergies or medical problems you have, any problems you have had with anesthetic medicines, any blood disorders you have, and whether you are or may be pregnant.  If you use a sleep apnea device, ask your health care provider whether you should bring it with you on the day of your surgery.  The spinal or epidural anesthetic will be injected through the space between the  bones in your back.  Do not drive for 24 hours if you received a sedative. This information is not intended to replace advice given to you by your health care provider. Make sure you discuss any questions you have with  your health care provider. Document Released: 05/29/2003 Document Revised: 10/20/2016 Document Reviewed: 06/30/2015 Elsevier Interactive Patient Education  2019 [place]. Spinal Anesthesia and Epidural Anesthesia, Care After This sheet gives you information about how to care for yourself after your procedure. Your doctor may also give you more specific instructions. If you have problems or questions, call your doctor. Follow these instructions at home: For at least 24 hours after the procedure:   Have a responsible adult stay with you. It is important to have someone help care for you until you are awake and alert.  Rest as needed.  Do not do activities where you could fall or get hurt (injured).  Do not drive.  Do not use heavy machinery.  Do not drink alcohol.  Do not take sleeping pills or medicines that make you sleepy (drowsy).  Do not make important decisions.  Do not sign legal documents.  Do not take care of children on your own. Eating and drinking  If you throw up (vomit), drink water, juice, or soup when nausea and vomiting stop.  Drink enough fluid to keep your pee (urine) pale yellow.  Make sure you do not feel like throwing up (nauseous) before you eat solid foods.  Follow the diet that your doctor recommends. General instructions  Return to your normal activities as told by your doctor. Ask your doctor what activities are safe for you.  Take over-the-counter and prescription medicines only as told by your doctor.  If you have sleep apnea, surgery and certain medicines can raise your risk for breathing problems. Follow instructions from your doctor about when to wear your sleep device. Your doctor may tell you to wear your sleep device: ? Anytime you are sleeping, including during daytime naps. ? While taking prescription pain medicines, sleeping pills, or medicines that make you sleepy.  Do not use any products that contain nicotine or tobacco. This  includes cigarettes and e-cigarettes. ? If you need help quitting, ask your doctor. ? If you smoke, do not smoke by yourself. Make sure someone is nearby in case you need help.  Keep all follow-up visits as told by your doctor. This is important. Contact a doctor if:  It has been more than one day since your procedure and you feel like throwing up.  It has been more than one day since your procedure and you throw up.  You have a rash. Get help right away if:  You have a fever.  You have a headache that lasts a long time.  You have a very bad headache.  Your vision is blurry.  You see two of a single object (double vision).  You are dizzy or light-headed.  You faint.  Your arms or legs tingle, feel weak, or get numb.  You have trouble breathing.  You cannot pee (urinate). Summary  After the procedure, have a responsible adult stay with you at home until you are fully awake and alert.  Do not do activities that might get you injured. Do not drive, use heavy machinery, drink alcohol, or make important decisions for 24 hours after the procedure.  Take medicines as told by your doctor. Do not use products that contain nicotine or tobacco.  Get help right away if  you have a fever, blurry vision, difficulty breathing or passing urine, or weakness or numbness in arms or legs. This information is not intended to replace advice given to you by your health care provider. Make sure you discuss any questions you have with your health care provider. Document Released: 06/30/2015 Document Revised: 10/20/2016 Document Reviewed: 06/30/2015 Elsevier Interactive Patient Education  2019 [employer].

## 2018-05-11 ENCOUNTER — Other Ambulatory Visit (HOSPITAL_COMMUNITY): Payer: Medicare HMO

## 2018-05-11 ENCOUNTER — Other Ambulatory Visit: Payer: Self-pay

## 2018-05-11 ENCOUNTER — Encounter (HOSPITAL_COMMUNITY): Payer: Self-pay

## 2018-05-11 ENCOUNTER — Encounter (HOSPITAL_COMMUNITY)
Admission: RE | Admit: 2018-05-11 | Discharge: 2018-05-11 | Disposition: A | Discharge status: Home / Self Care | Payer: Medicare HMO | Source: Ambulatory Visit | Attending: Orthopedic Surgery | Admitting: Orthopedic Surgery

## 2018-05-11 DIAGNOSIS — Z01812 Encounter for preprocedural laboratory examination: Secondary | ICD-10-CM | POA: Diagnosis not present

## 2018-05-11 HISTORY — DX: Sleep apnea, unspecified: G47.30

## 2018-05-11 LAB — BASIC METABOLIC PANEL
Anion gap: 10 (ref 5–15)
BUN: 18 mg/dL (ref 8–23)
CO2: 28 mmol/L (ref 22–32)
Calcium: 8.6 mg/dL — ABNORMAL LOW (ref 8.9–10.3)
Chloride: 95 mmol/L — ABNORMAL LOW (ref 98–111)
Creatinine, Ser: 1.03 mg/dL (ref 0.61–1.24)
GFR calc Af Amer: >60 mL/min (ref >60)
GFR calc non Af Amer: >60 mL/min (ref >60)
Glucose, Bld: 107 mg/dL — ABNORMAL HIGH (ref 70–99)
POTASSIUM: 4.4 mmol/L (ref 3.5–5.1)
Sodium: 133 mmol/L — ABNORMAL LOW (ref 135–145)

## 2018-05-11 LAB — CBC WITH DIFFERENTIAL/PLATELET
ABS IMMATURE GRANULOCYTES: 0.03 10*3/uL (ref 0.00–0.07)
Basophils Absolute: 0 10*3/uL (ref 0.0–0.1)
Basophils Relative: 1 %
Eosinophils Absolute: 0.1 10*3/uL (ref 0.0–0.5)
Eosinophils Relative: 2 %
HEMATOCRIT: 40.9 % (ref 39.0–52.0)
Hemoglobin: 13.3 g/dL (ref 13.0–17.0)
Immature Granulocytes: 1 %
LYMPHS PCT: 17 %
Lymphs Abs: 1.1 10*3/uL (ref 0.7–4.0)
MCH: 28.6 pg (ref 26.0–34.0)
MCHC: 32.5 g/dL (ref 30.0–36.0)
MCV: 88 fL (ref 80.0–100.0)
Monocytes Absolute: 0.5 10*3/uL (ref 0.1–1.0)
Monocytes Relative: 9 %
NEUTROS ABS: 4.4 10*3/uL (ref 1.7–7.7)
Neutrophils Relative %: 70 %
Platelets: 222 10*3/uL (ref 150–400)
RBC: 4.65 MIL/uL (ref 4.22–5.81)
RDW: 13.1 % (ref 11.5–15.5)
WBC: 6.2 10*3/uL (ref 4.0–10.5)
nRBC: 0 % (ref 0.0–0.2)

## 2018-05-11 LAB — SURGICAL PCR SCREEN
MRSA, PCR: NEGATIVE
Staphylococcus aureus: NEGATIVE

## 2018-05-11 LAB — PREPARE RBC (CROSSMATCH)

## 2018-05-11 LAB — GLUCOSE, CAPILLARY: Glucose-Capillary: 136 mg/dL — ABNORMAL HIGH (ref 70–99)

## 2018-05-11 LAB — HEMOGLOBIN A1C
Hgb A1c MFr Bld: 5.6 % (ref 4.8–5.6)
Mean Plasma Glucose: 114.02 mg/dL

## 2018-05-15 NOTE — H&P (Signed)
Patient ID: [name] [name], male   DOB: 06-07-1950, 68 y.o.   MRN: [id number]       Chief Complaint  Patient presents with  . Follow-up      Recheck on right hip replacement, DOS 04-20-13      HPI [name] [name] is a 68 y.o. male.  Status post  total hip arthroplasty. The patient is doing well the hip implant is functioning well.   The patient says his right hip is functioning well.  However, he has pain in both knees right worse than left with one episode of locking of the right knee.  He notices that he has trouble bending squatting kneeling or carrying things in both hands often needing one hand to help him with support and balance   Left knee pain is diffuse throughout the joint right knee pain seems to be more medial than lateral both knees seem to have a dull persistent ache which is worse with activity   Severity scale at its worst 8 and usually 3-4 out of 10     Review of Systems Review of Systems  Constitutional: Negative for chills and fever.  Skin: Negative.   Neurological: Negative for numbness.  All other systems reviewed and are negative.     Constitutional symptoms no fever or chills Neurologic symptoms no numbness or tingling       Past Medical History:  Diagnosis Date  . Aortic atherosclerosis ([place]) 10/19/2016     Seen on chest x-ray July 2018  . Arthritis      right knee and hip   . Cancer [facility])      prostate cancer   . Chronic kidney disease      prostate cancer   . Diabetes mellitus without complication ([place])    . GERD (gastroesophageal reflux disease)    . Gout    . Gout    . Hypercholesteremia    . Hyperglycemia    . Hyperlipidemia    . Hypertension    . Status post THR (total hip replacement) 04/20/13 05/01/2013    Right total hip 04/20/2013 Dr. Lydia Guiles implant            Past Surgical History:  Procedure Laterality Date  . COLONOSCOPY      . COLONOSCOPY N/A 10/22/2013    Procedure: COLONOSCOPY;  Surgeon: Danie Binder, MD;   [facility];  Service: Endoscopy;  Laterality: N/A;  1115  . HERNIA REPAIR        right inguinal hernia repair   . NASAL SEPTUM SURGERY      . OTHER SURGICAL HISTORY        deviated septum surgery   . ROBOT ASSISTED LAPAROSCOPIC RADICAL PROSTATECTOMY   04/20/2011    Procedure: ROBOTIC ASSISTED LAPAROSCOPIC RADICAL PROSTATECTOMY;  Surgeon: Malka So, MD;  Location: [facility] ORS;  Service: Urology;  Laterality: N/A;  Bilateral lymph node dissection  . TOTAL HIP ARTHROPLASTY Right 04/20/2013    Procedure: TOTAL HIP ARTHROPLASTY;  Surgeon: Carole Civil, MD;  Location: [facility] ORS;  Service: Orthopedics;  Laterality: Right;             Allergies  Allergen Reactions  . Lipitor [Atorvastatin]        Myalgia              Current Outpatient Medications  Medication Sig Dispense Refill  . allopurinol (ZYLOPRIM) 100 MG tablet TAKE 1 TABLET EVERY DAY 90 tablet 0  . aspirin 81  MG tablet Take 81 mg by mouth daily.      [name] atorvastatin (LIPITOR) 40 MG tablet TAKE 1 TABLET EVERY DAY 90 tablet 0  . B Complex-C (SUPER B COMPLEX PO) Take 1 tablet by mouth daily.      . Calcium Carbonate-Vitamin D (CALCIUM-VITAMIN D) 500-200 MG-UNIT per tablet Take 1 tablet by mouth daily.      [name] lisinopril-hydrochlorothiazide (PRINZIDE,ZESTORETIC) 20-25 MG tablet Take 1 tablet by mouth daily. 90 tablet 1  . Multiple Vitamin (MULTIVITAMIN) tablet Take 1 tablet by mouth daily.       . naproxen sodium (ANAPROX) 220 MG tablet Take 220 mg by mouth 2 (two) times daily with a meal.       . Omega-3 Fatty Acids (FISH OIL) 1000 MG CAPS Take by mouth.      . ranitidine (ZANTAC) 150 MG capsule Take 150 mg by mouth daily.       . vitamin E 400 UNIT capsule Take 400 Units by mouth daily.         No current facility-administered medications for this visit.         Physical Exam Blood pressure (!) 161/86, pulse 75, height 5' 5.5" (1.664 m), weight 213 lb (96.6 kg).   Overall appearance normal grooming and hygiene  normal. The patient is awake alert and oriented 3 with a pleasant mood and affect. The patient is ambulatory without assistive device and without limping.   RIGHT HIP: The skin incision is healed without any erythema or tenderness or neuroma. Hip flexion strength grade 5. Hip is stable to post pull and abduction stress test. Hip flexion is 120. There is no tenderness or swelling around the hip. Distal pulses are intact sensation is normal and there is no lymphadenopathy in the groin patient walks with normal balance and coordination   Right Knee Exam    Muscle Strength  The patient has normal right knee strength.   Tenderness  The patient is experiencing tenderness in the medial joint line and lateral joint line.   Range of Motion  Extension: normal  Flexion: 120    Tests  McMurray:  Medial - negative Lateral - negative Varus: negative Valgus: negative Drawer:  Anterior - negative    Posterior - negative   Other  Erythema: absent Scars: absent Sensation: normal Pulse: present Swelling: none Effusion: no effusion present     Left Knee Exam    Muscle Strength  The patient has normal left knee strength.   Tenderness  The patient is experiencing tenderness in the lateral joint line and medial joint line.   Range of Motion  Extension: normal  Flexion: 120    Tests  McMurray:  Medial - negative Lateral - negative Varus: negative Valgus: negative Drawer:  Anterior - negative     Posterior - negative   Other  Erythema: absent Scars: absent Sensation: normal Pulse: present Swelling: none Effusion: no effusion present    Data Reviewed  Patient ID: [name] [name], male   DOB: Jul 29, 1950, 68 y.o.   MRN: [id number]       Chief Complaint  Patient presents with  . Follow-up      Recheck on right hip replacement, DOS 04-20-13      HPI [name] [name] is a 68 y.o. male.  Status post  total hip arthroplasty. The patient is doing well the hip implant is  functioning well.   The patient says his right hip is functioning well.  However, he has pain  in both knees right worse than left with one episode of locking of the right knee.  He notices that he has trouble bending squatting kneeling or carrying things in both hands often needing one hand to help him with support and balance   Left knee pain is diffuse throughout the joint right knee pain seems to be more medial than lateral both knees seem to have a dull persistent ache which is worse with activity   Severity scale at its worst 8 and usually 3-4 out of 10     Review of Systems Review of Systems  Constitutional: Negative for chills and fever.  Skin: Negative.   Neurological: Negative for numbness.  All other systems reviewed and are negative.     Constitutional symptoms no fever or chills Neurologic symptoms no numbness or tingling  Social History   Tobacco Use  . Smoking status: Former Smoker    Packs/day: 0.50    Years: 8.00    Pack years: 4.00    Types: Cigarettes    Last attempt to quit: 03/22/1984    Years since quitting: 34.1  . Smokeless tobacco: Never Used  Substance Use Topics  . Alcohol use: Yes    Comment: occasional   . Drug use: No          Past Medical History:  Diagnosis Date  . Aortic atherosclerosis ([place]) 10/19/2016     Seen on chest x-ray July 2018  . Arthritis      right knee and hip   . Cancer [facility])      prostate cancer   . Chronic kidney disease      prostate cancer   . Diabetes mellitus without complication ([place])    . GERD (gastroesophageal reflux disease)    . Gout    . Gout    . Hypercholesteremia    . Hyperglycemia    . Hyperlipidemia    . Hypertension    . Status post THR (total hip replacement) 04/20/13 05/01/2013    Right total hip 04/20/2013 Dr. Lydia Guiles implant            Past Surgical History:  Procedure Laterality Date  . COLONOSCOPY      . COLONOSCOPY N/A 10/22/2013    Procedure: COLONOSCOPY;  Surgeon: Danie Binder, MD;  [facility];  Service: Endoscopy;  Laterality: N/A;  1115  . HERNIA REPAIR        right inguinal hernia repair   . NASAL SEPTUM SURGERY      . OTHER SURGICAL HISTORY        deviated septum surgery   . ROBOT ASSISTED LAPAROSCOPIC RADICAL PROSTATECTOMY   04/20/2011    Procedure: ROBOTIC ASSISTED LAPAROSCOPIC RADICAL PROSTATECTOMY;  Surgeon: Malka So, MD;  Location: [facility];  Service: Urology;  Laterality: N/A;  Bilateral lymph node dissection  . TOTAL HIP ARTHROPLASTY Right 04/20/2013    Procedure: TOTAL HIP ARTHROPLASTY;  Surgeon: Carole Civil, MD;  Location: [facility];  Service: Orthopedics;  Laterality: Right;      Family History  Problem Relation Age of Onset  . Diabetes Mother   . Hypertension Mother   . Hypertension Brother            Allergies  Allergen Reactions  . Lipitor [Atorvastatin]        Myalgia              Current Outpatient Medications  Medication Sig Dispense Refill  . allopurinol (ZYLOPRIM) 100 MG  tablet TAKE 1 TABLET EVERY DAY 90 tablet 0  . aspirin 81 MG tablet Take 81 mg by mouth daily.      [name] atorvastatin (LIPITOR) 40 MG tablet TAKE 1 TABLET EVERY DAY 90 tablet 0  . B Complex-C (SUPER B COMPLEX PO) Take 1 tablet by mouth daily.      . Calcium Carbonate-Vitamin D (CALCIUM-VITAMIN D) 500-200 MG-UNIT per tablet Take 1 tablet by mouth daily.      [name] lisinopril-hydrochlorothiazide (PRINZIDE,ZESTORETIC) 20-25 MG tablet Take 1 tablet by mouth daily. 90 tablet 1  . Multiple Vitamin (MULTIVITAMIN) tablet Take 1 tablet by mouth daily.       . naproxen sodium (ANAPROX) 220 MG tablet Take 220 mg by mouth 2 (two) times daily with a meal.       . Omega-3 Fatty Acids (FISH OIL) 1000 MG CAPS Take by mouth.      . ranitidine (ZANTAC) 150 MG capsule Take 150 mg by mouth daily.       . vitamin E 400 UNIT capsule Take 400 Units by mouth daily.         No current facility-administered medications for this visit.         Physical Exam Blood  pressure (!) 161/86, pulse 75, height 5' 5.5" (1.664 m), weight 213 lb (96.6 kg).   Overall appearance normal grooming and hygiene normal. The patient is awake alert and oriented 3 with a pleasant mood and affect. The patient is ambulatory without assistive device and without limping.   RIGHT HIP: The skin incision is healed without any erythema or tenderness or neuroma. Hip flexion strength grade 5. Hip is stable to post pull and abduction stress test. Hip flexion is 120. There is no tenderness or swelling around the hip. Distal pulses are intact sensation is normal and there is no lymphadenopathy in the groin patient walks with normal balance and coordination   Right Knee Exam    Muscle Strength  The patient has normal right knee strength.   Tenderness  The patient is experiencing tenderness in the medial joint line and lateral joint line.   Range of Motion  Extension: normal  Flexion: 120    Tests  McMurray:  Medial - negative Lateral - negative Varus: negative Valgus: negative Drawer:  Anterior - negative    Posterior - negative   Other  Erythema: absent Scars: absent Sensation: normal Pulse: present Swelling: none Effusion: no effusion present     Left Knee Exam    Muscle Strength  The patient has normal left knee strength.   Tenderness  The patient is experiencing tenderness in the lateral joint line and medial joint line.   Range of Motion  Extension: normal  Flexion: 120    Tests  McMurray:  Medial - negative Lateral - negative Varus: negative Valgus: negative Drawer:  Anterior - negative     Posterior - negative   Other  Erythema: absent Scars: absent Sensation: normal Pulse: present Swelling: none Effusion: no effusion present             Data Reviewed X-rays were ordered today, separate report was dictated.  My interpretation of the x-ray today      Assessment Status post right total hip, stable Bilateral osteoarthritis of the knee    Right total knee     Plan Routine repeat x-ray right hip in one year   The procedure has been fully reviewed with the patient; The risks and benefits of surgery have been discussed  and explained and understood. Alternative treatment has also been reviewed, questions were encouraged and answered. The postoperative plan is also been reviewed.     1:44 PM [name], MD 04/03/2018 CLINICAL DATA:  Left carotid bruit   EXAM: BILATERAL CAROTID DUPLEX ULTRASOUND   TECHNIQUE: Pearline Cables scale imaging, color Doppler and duplex ultrasound were performed of bilateral carotid and vertebral arteries in the neck.   COMPARISON:  None.   FINDINGS: Criteria: Quantification of carotid stenosis is based on velocity parameters that correlate the residual internal carotid diameter with NASCET-based stenosis levels, using the diameter of the distal internal carotid lumen as the denominator for stenosis measurement.   The following velocity measurements were obtained:   RIGHT   ICA: 101/26 cm/sec   CCA: 973/53 cm/sec   SYSTOLIC ICA/CCA RATIO:  0.9   ECA: 189 cm/sec   LEFT   ICA: 97/31 cm/sec   CCA: 299/24 cm/sec   SYSTOLIC ICA/CCA RATIO:  0.8   ECA: 177 cm/sec   RIGHT CAROTID ARTERY: Minor echogenic shadowing plaque formation. No hemodynamically significant right ICA stenosis, velocity elevation, or turbulent flow. Degree of narrowing less than 50%.   RIGHT VERTEBRAL ARTERY:  Antegrade   LEFT CAROTID ARTERY: Similar scattered minor echogenic plaque formation. No hemodynamically significant left ICA stenosis, velocity elevation, or turbulent flow.   LEFT VERTEBRAL ARTERY:  Antegrade   IMPRESSION: Minor carotid atherosclerosis. No hemodynamically significant ICA stenosis. Degree of narrowing less than 50% bilaterally by ultrasound criteria.   Patent antegrade vertebral flow bilaterally     Electronically Signed   By: Jerilynn Mages.  Shick M.D.   On: 04/17/2018 09:12  X-rays  were ordered today, separate report was dictated.  My interpretation of the x-ray today      Assessment RIGHT KNEE OA   Right total knee      Plan    The procedure has been fully reviewed with the patient; The risks and benefits of surgery have been discussed and explained and understood. Alternative treatment has also been reviewed, questions were encouraged and answered. The postoperative plan is also been reviewed.     1:44 PM [name], MD 04/03/2018

## 2018-05-16 ENCOUNTER — Other Ambulatory Visit: Payer: Self-pay

## 2018-05-16 ENCOUNTER — Encounter (HOSPITAL_COMMUNITY)
Admission: AD | Disposition: A | Discharge status: Skilled Nursing Facility | Payer: Self-pay | Source: Home / Self Care | Attending: Orthopedic Surgery

## 2018-05-16 ENCOUNTER — Ambulatory Visit (HOSPITAL_COMMUNITY): Payer: Medicare HMO

## 2018-05-16 ENCOUNTER — Encounter (HOSPITAL_COMMUNITY): Payer: Self-pay | Admitting: *Deleted

## 2018-05-16 ENCOUNTER — Ambulatory Visit (HOSPITAL_COMMUNITY): Payer: Medicare HMO | Admitting: Anesthesiology

## 2018-05-16 ENCOUNTER — Inpatient Hospital Stay (HOSPITAL_COMMUNITY)
Admission: AD | Admit: 2018-05-16 | Discharge: 2018-05-19 | DRG: 470 | Disposition: A | Discharge status: Skilled Nursing Facility | Payer: Medicare HMO | Attending: Orthopedic Surgery | Admitting: Orthopedic Surgery

## 2018-05-16 DIAGNOSIS — Z791 Long term (current) use of non-steroidal anti-inflammatories (NSAID): Secondary | ICD-10-CM | POA: Diagnosis not present

## 2018-05-16 DIAGNOSIS — Z87891 Personal history of nicotine dependence: Secondary | ICD-10-CM

## 2018-05-16 DIAGNOSIS — M1710 Unilateral primary osteoarthritis, unspecified knee: Secondary | ICD-10-CM | POA: Diagnosis present

## 2018-05-16 DIAGNOSIS — I700 Atherosclerosis of aorta: Secondary | ICD-10-CM | POA: Diagnosis present

## 2018-05-16 DIAGNOSIS — E785 Hyperlipidemia, unspecified: Secondary | ICD-10-CM | POA: Diagnosis present

## 2018-05-16 DIAGNOSIS — I129 Hypertensive chronic kidney disease with stage 1 through stage 4 chronic kidney disease, or unspecified chronic kidney disease: Secondary | ICD-10-CM | POA: Diagnosis present

## 2018-05-16 DIAGNOSIS — Z471 Aftercare following joint replacement surgery: Secondary | ICD-10-CM | POA: Diagnosis not present

## 2018-05-16 DIAGNOSIS — Z96641 Presence of right artificial hip joint: Secondary | ICD-10-CM | POA: Diagnosis present

## 2018-05-16 DIAGNOSIS — N189 Chronic kidney disease, unspecified: Secondary | ICD-10-CM | POA: Diagnosis present

## 2018-05-16 DIAGNOSIS — M1711 Unilateral primary osteoarthritis, right knee: Secondary | ICD-10-CM | POA: Diagnosis not present

## 2018-05-16 DIAGNOSIS — Z8546 Personal history of malignant neoplasm of prostate: Secondary | ICD-10-CM

## 2018-05-16 DIAGNOSIS — E1121 Type 2 diabetes mellitus with diabetic nephropathy: Secondary | ICD-10-CM | POA: Diagnosis not present

## 2018-05-16 DIAGNOSIS — Z96651 Presence of right artificial knee joint: Secondary | ICD-10-CM | POA: Diagnosis not present

## 2018-05-16 DIAGNOSIS — Z8249 Family history of ischemic heart disease and other diseases of the circulatory system: Secondary | ICD-10-CM

## 2018-05-16 DIAGNOSIS — M109 Gout, unspecified: Secondary | ICD-10-CM | POA: Diagnosis present

## 2018-05-16 DIAGNOSIS — E1122 Type 2 diabetes mellitus with diabetic chronic kidney disease: Secondary | ICD-10-CM | POA: Diagnosis present

## 2018-05-16 DIAGNOSIS — Z79899 Other long term (current) drug therapy: Secondary | ICD-10-CM

## 2018-05-16 DIAGNOSIS — Z888 Allergy status to other drugs, medicaments and biological substances status: Secondary | ICD-10-CM

## 2018-05-16 DIAGNOSIS — M1990 Unspecified osteoarthritis, unspecified site: Secondary | ICD-10-CM | POA: Diagnosis present

## 2018-05-16 DIAGNOSIS — E7800 Pure hypercholesterolemia, unspecified: Secondary | ICD-10-CM | POA: Diagnosis present

## 2018-05-16 DIAGNOSIS — K219 Gastro-esophageal reflux disease without esophagitis: Secondary | ICD-10-CM | POA: Diagnosis present

## 2018-05-16 DIAGNOSIS — Z7982 Long term (current) use of aspirin: Secondary | ICD-10-CM | POA: Diagnosis not present

## 2018-05-16 DIAGNOSIS — M25561 Pain in right knee: Secondary | ICD-10-CM | POA: Diagnosis present

## 2018-05-16 DIAGNOSIS — I2510 Atherosclerotic heart disease of native coronary artery without angina pectoris: Secondary | ICD-10-CM | POA: Diagnosis not present

## 2018-05-16 DIAGNOSIS — M170 Bilateral primary osteoarthritis of knee: Secondary | ICD-10-CM | POA: Diagnosis present

## 2018-05-16 DIAGNOSIS — Z833 Family history of diabetes mellitus: Secondary | ICD-10-CM

## 2018-05-16 DIAGNOSIS — M1000 Idiopathic gout, unspecified site: Secondary | ICD-10-CM | POA: Diagnosis not present

## 2018-05-16 DIAGNOSIS — M6281 Muscle weakness (generalized): Secondary | ICD-10-CM | POA: Diagnosis not present

## 2018-05-16 HISTORY — PX: TOTAL KNEE ARTHROPLASTY: SHX125

## 2018-05-16 LAB — GLUCOSE, CAPILLARY: Glucose-Capillary: 95 mg/dL (ref 70–99)

## 2018-05-16 SURGERY — ARTHROPLASTY, KNEE, TOTAL
Anesthesia: Choice | Laterality: Right

## 2018-05-16 SURGERY — ARTHROPLASTY, KNEE, TOTAL
Anesthesia: General | Site: Knee | Laterality: Right

## 2018-05-16 MED ORDER — MEPERIDINE HCL 50 MG/ML IJ SOLN: 6.2500 mg | INTRAMUSCULAR | Status: DC | PRN

## 2018-05-16 MED ORDER — KETOROLAC TROMETHAMINE 15 MG/ML IJ SOLN
7.5000 mg | Freq: Four times a day (QID) | INTRAMUSCULAR | Status: AC
  Administered 2018-05-16 – 2018-05-17 (×4): 7.5 mg via INTRAVENOUS
  Filled 2018-05-16 (×4): qty 1

## 2018-05-16 MED ORDER — ONDANSETRON HCL 4 MG/2ML IJ SOLN
INTRAMUSCULAR | Status: DC | PRN
  Administered 2018-05-16: 4 mg via INTRAVENOUS

## 2018-05-16 MED ORDER — METHOCARBAMOL 1000 MG/10ML IJ SOLN
500.0000 mg | Freq: Four times a day (QID) | INTRAVENOUS | Status: DC | PRN
  Filled 2018-05-16: qty 5

## 2018-05-16 MED ORDER — CHLORHEXIDINE GLUCONATE 4 % EX LIQD: 60.0000 mL | Freq: Once | CUTANEOUS | Status: DC

## 2018-05-16 MED ORDER — HYDROCODONE-ACETAMINOPHEN 7.5-325 MG PO TABS: 1.0000 | ORAL_TABLET | Freq: Once | ORAL | Status: DC | PRN

## 2018-05-16 MED ORDER — MIDAZOLAM HCL 5 MG/5ML IJ SOLN
INTRAMUSCULAR | Status: DC | PRN
  Administered 2018-05-16: 2 mg via INTRAVENOUS

## 2018-05-16 MED ORDER — HYDROCHLOROTHIAZIDE 25 MG PO TABS
25.0000 mg | ORAL_TABLET | Freq: Every day | ORAL | Status: DC
  Administered 2018-05-17 – 2018-05-19 (×3): 25 mg via ORAL
  Filled 2018-05-16 (×3): qty 1

## 2018-05-16 MED ORDER — TRAMADOL HCL 50 MG PO TABS
50.0000 mg | ORAL_TABLET | Freq: Four times a day (QID) | ORAL | Status: DC
  Administered 2018-05-16 – 2018-05-19 (×12): 50 mg via ORAL
  Filled 2018-05-16 (×12): qty 1

## 2018-05-16 MED ORDER — PANTOPRAZOLE SODIUM 40 MG PO TBEC
40.0000 mg | DELAYED_RELEASE_TABLET | Freq: Every day | ORAL | Status: DC
  Administered 2018-05-17 – 2018-05-19 (×3): 40 mg via ORAL
  Filled 2018-05-16 (×3): qty 1

## 2018-05-16 MED ORDER — PROPOFOL 500 MG/50ML IV EMUL
INTRAVENOUS | Status: DC | PRN
  Administered 2018-05-16: 50 ug/kg/min via INTRAVENOUS

## 2018-05-16 MED ORDER — SODIUM CHLORIDE 0.9 % IV SOLN
INTRAVENOUS | Status: DC
  Administered 2018-05-16: 12:00:00 via INTRAVENOUS

## 2018-05-16 MED ORDER — LISINOPRIL 10 MG PO TABS
20.0000 mg | ORAL_TABLET | Freq: Every day | ORAL | Status: DC
  Administered 2018-05-17 – 2018-05-19 (×3): 20 mg via ORAL
  Filled 2018-05-16 (×3): qty 2

## 2018-05-16 MED ORDER — SODIUM CHLORIDE (PF) 0.9 % IJ SOLN
INTRAMUSCULAR | Status: AC
  Filled 2018-05-16: qty 40

## 2018-05-16 MED ORDER — LIDOCAINE HCL (CARDIAC) PF 100 MG/5ML IV SOSY
PREFILLED_SYRINGE | INTRAVENOUS | Status: DC | PRN
  Administered 2018-05-16: 40 mg via INTRAVENOUS

## 2018-05-16 MED ORDER — TRANEXAMIC ACID-NACL 1000-0.7 MG/100ML-% IV SOLN
1000.0000 mg | INTRAVENOUS | Status: AC
  Administered 2018-05-16: 1000 mg via INTRAVENOUS

## 2018-05-16 MED ORDER — CEFAZOLIN SODIUM-DEXTROSE 2-4 GM/100ML-% IV SOLN
INTRAVENOUS | Status: AC
  Filled 2018-05-16: qty 100

## 2018-05-16 MED ORDER — ATORVASTATIN CALCIUM 40 MG PO TABS
40.0000 mg | ORAL_TABLET | Freq: Every day | ORAL | Status: DC
  Administered 2018-05-16 – 2018-05-19 (×4): 40 mg via ORAL
  Filled 2018-05-16 (×4): qty 1

## 2018-05-16 MED ORDER — BUPIVACAINE-EPINEPHRINE (PF) 0.5% -1:200000 IJ SOLN
INTRAMUSCULAR | Status: AC
  Filled 2018-05-16: qty 30

## 2018-05-16 MED ORDER — METOCLOPRAMIDE HCL 10 MG PO TABS: 5.0000 mg | ORAL_TABLET | Freq: Three times a day (TID) | ORAL | Status: DC | PRN

## 2018-05-16 MED ORDER — DEXAMETHASONE SODIUM PHOSPHATE 10 MG/ML IJ SOLN
10.0000 mg | Freq: Once | INTRAMUSCULAR | Status: DC
  Filled 2018-05-16: qty 1

## 2018-05-16 MED ORDER — ONDANSETRON HCL 4 MG PO TABS: 4.0000 mg | ORAL_TABLET | Freq: Four times a day (QID) | ORAL | Status: DC | PRN

## 2018-05-16 MED ORDER — ALLOPURINOL 100 MG PO TABS
100.0000 mg | ORAL_TABLET | Freq: Every day | ORAL | Status: DC
  Administered 2018-05-16 – 2018-05-19 (×4): 100 mg via ORAL
  Filled 2018-05-16 (×5): qty 1

## 2018-05-16 MED ORDER — HYDRALAZINE HCL 20 MG/ML IJ SOLN
10.0000 mg | Freq: Four times a day (QID) | INTRAMUSCULAR | Status: DC | PRN
  Administered 2018-05-16 – 2018-05-18 (×2): 10 mg via INTRAVENOUS
  Filled 2018-05-16 (×2): qty 1

## 2018-05-16 MED ORDER — DOCUSATE SODIUM 100 MG PO CAPS
100.0000 mg | ORAL_CAPSULE | Freq: Two times a day (BID) | ORAL | Status: DC
  Administered 2018-05-16 – 2018-05-19 (×7): 100 mg via ORAL
  Filled 2018-05-16 (×7): qty 1

## 2018-05-16 MED ORDER — ONDANSETRON HCL 4 MG/2ML IJ SOLN
4.0000 mg | Freq: Once | INTRAMUSCULAR | Status: DC | PRN
  Filled 2018-05-16: qty 2

## 2018-05-16 MED ORDER — SODIUM CHLORIDE 0.9 % IR SOLN
Status: DC | PRN
  Administered 2018-05-16: 3000 mL

## 2018-05-16 MED ORDER — PROPOFOL 10 MG/ML IV BOLUS
INTRAVENOUS | Status: AC
  Filled 2018-05-16: qty 40

## 2018-05-16 MED ORDER — ONDANSETRON HCL 4 MG/2ML IJ SOLN: 4.0000 mg | Freq: Four times a day (QID) | INTRAMUSCULAR | Status: DC | PRN

## 2018-05-16 MED ORDER — METOCLOPRAMIDE HCL 5 MG/ML IJ SOLN: 5.0000 mg | Freq: Three times a day (TID) | INTRAMUSCULAR | Status: DC | PRN

## 2018-05-16 MED ORDER — HYDROMORPHONE HCL 1 MG/ML IJ SOLN: 0.2500 mg | INTRAMUSCULAR | Status: DC | PRN

## 2018-05-16 MED ORDER — MENTHOL 3 MG MT LOZG: 1.0000 | LOZENGE | OROMUCOSAL | Status: DC | PRN

## 2018-05-16 MED ORDER — MORPHINE SULFATE (PF) 2 MG/ML IV SOLN: 0.5000 mg | INTRAVENOUS | Status: DC | PRN

## 2018-05-16 MED ORDER — TRANEXAMIC ACID-NACL 1000-0.7 MG/100ML-% IV SOLN
1000.0000 mg | Freq: Once | INTRAVENOUS | Status: AC
  Administered 2018-05-16: 1000 mg via INTRAVENOUS
  Filled 2018-05-16: qty 100

## 2018-05-16 MED ORDER — FENTANYL CITRATE (PF) 100 MCG/2ML IJ SOLN
INTRAMUSCULAR | Status: AC
  Filled 2018-05-16: qty 2

## 2018-05-16 MED ORDER — BUPIVACAINE LIPOSOME 1.3 % IJ SUSP
INTRAMUSCULAR | Status: AC
  Filled 2018-05-16: qty 20

## 2018-05-16 MED ORDER — ACETAMINOPHEN 500 MG PO TABS
500.0000 mg | ORAL_TABLET | Freq: Four times a day (QID) | ORAL | Status: AC
  Administered 2018-05-16 – 2018-05-17 (×4): 500 mg via ORAL
  Filled 2018-05-16 (×4): qty 1

## 2018-05-16 MED ORDER — CEFAZOLIN SODIUM-DEXTROSE 2-4 GM/100ML-% IV SOLN
2.0000 g | INTRAVENOUS | Status: AC
  Administered 2018-05-16: 2 g via INTRAVENOUS

## 2018-05-16 MED ORDER — PREGABALIN 50 MG PO CAPS
50.0000 mg | ORAL_CAPSULE | Freq: Once | ORAL | Status: AC
  Administered 2018-05-16: 50 mg via ORAL
  Filled 2018-05-16: qty 1

## 2018-05-16 MED ORDER — CEFAZOLIN SODIUM-DEXTROSE 2-4 GM/100ML-% IV SOLN
2.0000 g | Freq: Four times a day (QID) | INTRAVENOUS | Status: AC
  Administered 2018-05-16 (×2): 2 g via INTRAVENOUS
  Filled 2018-05-16 (×2): qty 100

## 2018-05-16 MED ORDER — OXYCODONE HCL 5 MG PO TABS
5.0000 mg | ORAL_TABLET | Freq: Once | ORAL | Status: AC
  Administered 2018-05-16: 5 mg via ORAL
  Filled 2018-05-16: qty 1

## 2018-05-16 MED ORDER — CELECOXIB 400 MG PO CAPS
400.0000 mg | ORAL_CAPSULE | Freq: Once | ORAL | Status: AC
  Administered 2018-05-16: 400 mg via ORAL
  Filled 2018-05-16: qty 1

## 2018-05-16 MED ORDER — HYDROCODONE-ACETAMINOPHEN 7.5-325 MG PO TABS
1.0000 | ORAL_TABLET | ORAL | Status: DC | PRN
  Administered 2018-05-17: 2 via ORAL
  Administered 2018-05-18: 1 via ORAL
  Filled 2018-05-16: qty 1
  Filled 2018-05-16: qty 2

## 2018-05-16 MED ORDER — ALUM & MAG HYDROXIDE-SIMETH 200-200-20 MG/5ML PO SUSP: 30.0000 mL | ORAL | Status: DC | PRN

## 2018-05-16 MED ORDER — MIDAZOLAM HCL 2 MG/2ML IJ SOLN
INTRAMUSCULAR | Status: AC
  Filled 2018-05-16: qty 2

## 2018-05-16 MED ORDER — BUPIVACAINE IN DEXTROSE 0.75-8.25 % IT SOLN
INTRATHECAL | Status: AC
  Filled 2018-05-16: qty 2

## 2018-05-16 MED ORDER — FENTANYL CITRATE (PF) 100 MCG/2ML IJ SOLN
INTRAMUSCULAR | Status: DC | PRN
  Administered 2018-05-16 (×2): 25 ug via INTRAVENOUS

## 2018-05-16 MED ORDER — GLYCOPYRROLATE 0.2 MG/ML IJ SOLN
INTRAMUSCULAR | Status: DC | PRN
  Administered 2018-05-16: 0.2 mg via INTRAVENOUS

## 2018-05-16 MED ORDER — EPINEPHRINE PF 1 MG/ML IJ SOLN
INTRAMUSCULAR | Status: AC
  Filled 2018-05-16: qty 1

## 2018-05-16 MED ORDER — LACTATED RINGERS IV SOLN
INTRAVENOUS | Status: DC
  Administered 2018-05-16: 1000 mL via INTRAVENOUS
  Administered 2018-05-16: 09:00:00 via INTRAVENOUS

## 2018-05-16 MED ORDER — PHENOL 1.4 % MT LIQD: 1.0000 | OROMUCOSAL | Status: DC | PRN

## 2018-05-16 MED ORDER — CELECOXIB 100 MG PO CAPS
200.0000 mg | ORAL_CAPSULE | Freq: Two times a day (BID) | ORAL | Status: DC
  Administered 2018-05-16 – 2018-05-19 (×6): 200 mg via ORAL
  Filled 2018-05-16 (×6): qty 2

## 2018-05-16 MED ORDER — DIPHENHYDRAMINE HCL 12.5 MG/5ML PO ELIX
12.5000 mg | ORAL_SOLUTION | ORAL | Status: DC | PRN
  Administered 2018-05-18 – 2018-05-19 (×2): 25 mg via ORAL
  Filled 2018-05-16 (×2): qty 10

## 2018-05-16 MED ORDER — KETOROLAC TROMETHAMINE 30 MG/ML IJ SOLN: 30.0000 mg | Freq: Once | INTRAMUSCULAR | Status: DC | PRN

## 2018-05-16 MED ORDER — BUPIVACAINE IN DEXTROSE 0.75-8.25 % IT SOLN
INTRATHECAL | Status: DC | PRN
  Administered 2018-05-16: 1.8 mL via INTRATHECAL

## 2018-05-16 MED ORDER — LISINOPRIL-HYDROCHLOROTHIAZIDE 20-25 MG PO TABS: 1.0000 | ORAL_TABLET | Freq: Every day | ORAL | Status: DC

## 2018-05-16 MED ORDER — SENNOSIDES-DOCUSATE SODIUM 8.6-50 MG PO TABS: 1.0000 | ORAL_TABLET | Freq: Every evening | ORAL | Status: DC | PRN

## 2018-05-16 MED ORDER — TRANEXAMIC ACID-NACL 1000-0.7 MG/100ML-% IV SOLN
INTRAVENOUS | Status: AC
  Filled 2018-05-16: qty 100

## 2018-05-16 MED ORDER — GABAPENTIN 300 MG PO CAPS
300.0000 mg | ORAL_CAPSULE | Freq: Three times a day (TID) | ORAL | Status: DC
  Administered 2018-05-16 – 2018-05-19 (×9): 300 mg via ORAL
  Filled 2018-05-16 (×9): qty 1

## 2018-05-16 MED ORDER — ASPIRIN EC 325 MG PO TBEC
325.0000 mg | DELAYED_RELEASE_TABLET | Freq: Every day | ORAL | Status: DC
  Administered 2018-05-17 – 2018-05-19 (×3): 325 mg via ORAL
  Filled 2018-05-16 (×4): qty 1

## 2018-05-16 MED ORDER — ADULT MULTIVITAMIN W/MINERALS CH
1.0000 | ORAL_TABLET | Freq: Every day | ORAL | Status: DC
  Administered 2018-05-16 – 2018-05-19 (×4): 1 via ORAL
  Filled 2018-05-16 (×4): qty 1

## 2018-05-16 MED ORDER — ONDANSETRON HCL 4 MG/2ML IJ SOLN
4.0000 mg | Freq: Once | INTRAMUSCULAR | Status: AC
  Administered 2018-05-16: 4 mg via INTRAVENOUS
  Filled 2018-05-16: qty 2

## 2018-05-16 MED ORDER — BUPIVACAINE-EPINEPHRINE (PF) 0.5% -1:200000 IJ SOLN
INTRAMUSCULAR | Status: DC | PRN
  Administered 2018-05-16: 20 mL via PERINEURAL

## 2018-05-16 MED ORDER — METHOCARBAMOL 500 MG PO TABS
500.0000 mg | ORAL_TABLET | Freq: Four times a day (QID) | ORAL | Status: DC | PRN
  Administered 2018-05-18: 500 mg via ORAL
  Filled 2018-05-16: qty 1

## 2018-05-16 MED ORDER — 0.9 % SODIUM CHLORIDE (POUR BTL) OPTIME
TOPICAL | Status: DC | PRN
  Administered 2018-05-16: 1000 mL

## 2018-05-16 MED ORDER — SODIUM CHLORIDE 0.9 % IV SOLN
INTRAVENOUS | Status: DC | PRN
  Administered 2018-05-16: 60 mL

## 2018-05-16 MED ORDER — METHOCARBAMOL 1000 MG/10ML IJ SOLN
500.0000 mg | Freq: Once | INTRAVENOUS | Status: DC
  Administered 2018-05-16: 500 mg via INTRAVENOUS
  Filled 2018-05-16 (×2): qty 5

## 2018-05-16 SURGICAL SUPPLY — 67 items
BANDAGE ELASTIC 4 LF NS (GAUZE/BANDAGES/DRESSINGS) ×6 IMPLANT
BANDAGE ELASTIC 6 LF NS (GAUZE/BANDAGES/DRESSINGS) ×3 IMPLANT
BANDAGE ESMARK 6X9 LF (GAUZE/BANDAGES/DRESSINGS) ×1 IMPLANT
BLADE HEX COATED 2.75 (ELECTRODE) ×3 IMPLANT
BLADE SAGITTAL 25.0X1.27X90 (BLADE) ×2 IMPLANT
BLADE SAGITTAL 25.0X1.27X90MM (BLADE) ×1
BLADE SAW SAG 90X13X1.27 (BLADE) IMPLANT
BNDG ESMARK 6X9 LF (GAUZE/BANDAGES/DRESSINGS) ×3
CEMENT HV SMART SET (Cement) ×6 IMPLANT
CHLORAPREP W/TINT 26ML (MISCELLANEOUS) ×4 IMPLANT
CLOTH BEACON ORANGE TIMEOUT ST (SAFETY) ×3 IMPLANT
COOLER CRYO CUFF IC AND MOTOR (MISCELLANEOUS) ×3 IMPLANT
COVER LIGHT HANDLE STERIS (MISCELLANEOUS) ×6 IMPLANT
COVER WAND RF STERILE (DRAPES) ×2 IMPLANT
CUFF CRYO KNEE18X23 MED (MISCELLANEOUS) ×2 IMPLANT
CUFF TOURNIQUET SINGLE 34IN LL (TOURNIQUET CUFF) ×2 IMPLANT
DECANTER SPIKE VIAL GLASS SM (MISCELLANEOUS) ×4 IMPLANT
DRAPE BACK TABLE (DRAPES) ×3 IMPLANT
DRAPE EXTREMITY T 121X128X90 (DISPOSABLE) ×3 IMPLANT
DRSG MEPILEX BORDER 4X12 (GAUZE/BANDAGES/DRESSINGS) ×3 IMPLANT
ELECT REM PT RETURN 9FT ADLT (ELECTROSURGICAL) ×3
ELECTRODE REM PT RTRN 9FT ADLT (ELECTROSURGICAL) ×1 IMPLANT
FEMUR RIGHT SZ 4 (Knees) ×2 IMPLANT
GLOVE BIO SURGEON STRL SZ7 (GLOVE) ×6 IMPLANT
GLOVE BIOGEL PI IND STRL 7.0 (GLOVE) ×2 IMPLANT
GLOVE BIOGEL PI INDICATOR 7.0 (GLOVE) ×8
GLOVE SKINSENSE NS SZ8.0 LF (GLOVE) ×4
GLOVE SKINSENSE STRL SZ8.0 LF (GLOVE) ×2 IMPLANT
GLOVE SS N UNI LF 8.5 STRL (GLOVE) ×3 IMPLANT
GOWN STRL REUS W/ TWL LRG LVL3 (GOWN DISPOSABLE) ×1 IMPLANT
GOWN STRL REUS W/TWL LRG LVL3 (GOWN DISPOSABLE) ×6 IMPLANT
GOWN STRL REUS W/TWL XL LVL3 (GOWN DISPOSABLE) ×3 IMPLANT
HANDPIECE INTERPULSE COAX TIP (DISPOSABLE) ×2
HOOD W/PEELAWAY (MISCELLANEOUS) ×10 IMPLANT
INSERT CROSS LINKED SZ 4 10MM (Knees) ×2 IMPLANT
INST SET MAJOR BONE (KITS) ×3 IMPLANT
IV NS IRRIG 3000ML ARTHROMATIC (IV SOLUTION) ×3 IMPLANT
KIT BLADEGUARD II DBL (SET/KITS/TRAYS/PACK) ×3 IMPLANT
KIT TURNOVER CYSTO (KITS) ×3 IMPLANT
MANIFOLD NEPTUNE II (INSTRUMENTS) ×3 IMPLANT
MARKER SKIN DUAL TIP RULER LAB (MISCELLANEOUS) ×3 IMPLANT
NDL HYPO 21X1.5 SAFETY (NEEDLE) ×1 IMPLANT
NEEDLE HYPO 21X1.5 SAFETY (NEEDLE) ×3 IMPLANT
NS IRRIG 1000ML POUR BTL (IV SOLUTION) ×3 IMPLANT
PACK TOTAL JOINT (CUSTOM PROCEDURE TRAY) ×3 IMPLANT
PAD ARMBOARD 7.5X6 YLW CONV (MISCELLANEOUS) ×3 IMPLANT
PAD DANNIFLEX CPM (ORTHOPEDIC SUPPLIES) ×3 IMPLANT
PATELLA DOME PFC 38MM (Knees) ×2 IMPLANT
PILLOW KNEE EXTENSION 0 DEG (MISCELLANEOUS) ×3 IMPLANT
PIN THREADED HEADED SIGMA (PIN) ×2 IMPLANT
PIN TROCAR 3 INCH (PIN) IMPLANT
PIN/DRILL PACK ORTHO 1/8X3.0 (PIN) ×2 IMPLANT
SAW OSC TIP CART 19.5X105X1.3 (SAW) ×3 IMPLANT
SET BASIN LINEN APH (SET/KITS/TRAYS/PACK) ×3 IMPLANT
SET HNDPC FAN SPRY TIP SCT (DISPOSABLE) ×1 IMPLANT
STAPLER VISISTAT 35W (STAPLE) ×3 IMPLANT
SUT BRALON NAB BRD #1 30IN (SUTURE) ×6 IMPLANT
SUT MNCRL 0 VIOLET CTX 36 (SUTURE) ×1 IMPLANT
SUT MON AB 0 CT1 (SUTURE) ×2 IMPLANT
SUT MONOCRYL 0 CTX 36 (SUTURE) ×2
SYRINGE 20CC LL (MISCELLANEOUS) ×15 IMPLANT
TOWEL OR 17X26 4PK STRL BLUE (TOWEL DISPOSABLE) ×3 IMPLANT
TOWER CARTRIDGE SMART MIX (DISPOSABLE) ×3 IMPLANT
TRAY FOLEY MTR SLVR 16FR STAT (SET/KITS/TRAYS/PACK) ×3 IMPLANT
TRAY TIBIAL MOD SZ 4.0 (Knees) ×2 IMPLANT
WATER STERILE IRR 1000ML POUR (IV SOLUTION) ×6 IMPLANT
YANKAUER SUCT 12FT TUBE ARGYLE (SUCTIONS) ×3 IMPLANT

## 2018-05-16 NOTE — Op Note (Signed)
05/16/2018  9:32 AM  PATIENT:  [name] [name]  68 y.o. male  PRE-OPERATIVE DIAGNOSIS:  right knee osteoarthritis  POST-OPERATIVE DIAGNOSIS:  right knee osteoarthritis  PROCEDURE:  Procedure(s): TOTAL KNEE ARTHROPLASTY (Right) - 27447  FINDINGS: MFC GR 4  MED TIB GR 4  P GR 2  MILD VARUS   DEPUY SIGMA FB PS  110F 4T 10 PS POLY, 38 P  SURGEON:  Surgeon(s) and Role:    Carole Civil, MD - Primary     The patient was identified in the preop holding area and the surgical site was confirmed as the right knee. Chart review and update were completed. The patient was taken to the operating room for spinal anesthesia. After successful spinal anesthesia Foley catheter was inserted. The patient was placed supine on the operating table.   the right leg was prepped with DuraPrep and draped sterilely. Timeout was completed. The limb was then exsanguinated a  6 inch Esmarch. The tourniquet was elevated to 300 mmHg.   A midline incision was made and taken down to the extensor mechanism followed by medial arthrotomy. The patella was everted. A synovectomy was performed as needed. The osteophytes were resected.  Anterior cruciate ligament and PCL and medial and lateral meniscus were resected.   a 3/8 inch drill bit was used to enter the femoral canal which was suctioned and irrigated until the fluid was clear. The distal femoral cut was set for 11 millimeter resection with a 5  Right Valgus angle. This cut was completed and checked for flatness.   the femur was then measured to a size 4.    the tibia was subluxated forward and the external alignment guide was placed. We removed 10 mm of bone from the higher  LATERAL side. We set the guide for neutral varus valgus cut related to the  Mechanical axis of the tibia and for slope matching the patient's anatomy. Rotational alignment was set using the tibial tubercle, tibial spine and second metatarsal. The cutting block was pinned and the  proximal tibia was resected.   A spacer block was placed starting with a 10 mm insert to check the extension gap. IT WAS EQUAL AND STABLE    The 4-in-1 femoral cutting block was placed to match the femoral epicondyles and the 4 distal cuts were made.   A spacer block was placed starting with a 10 mm insert to check the flexion and extension gap. The 10 spacer block confirmed proper balance in flexion and extension.   We placed the femoral notch cutting guide size 4  and resected the notch.   Trial implants were placed using appropriate size femur , appropriate size tibial baseplate which was measured after the proximal tibia resection. Tibial rotation was set patella tracking was normal   The tibia was then punched per manufacture technique making sure to avoid internal rotation.   The patella measured a size 24   We resected down to a size 13 using a size 38 X 9 button. Total patella thickness was 22   Final range of motion check was performed with the appropriate size trials as mentioned above. Satisfactory reduction and motion were obtained.   Trial implants were removed. The bone was irrigated and dried and the cement was mixed on the back table  exparel was injected in the soft tissues and posterior capsule of the knee  These implants were then cemented in place. Excess cement was removed. The cement was allowed to cure.  Second irrigation was performed.    FInal range of motion check and stability check was completed  The wound was irrigated t a third time ,Hemovac drain was placed, extensor mechanism was closed with #1 Nurolon followed by 0 Monocryl and staples to reapproximate the skin edges and subcutaneous tissue.   Sterile dressing and cryocuff  was applied  The patient was taken recovery in stable condition        PHYSICIAN ASSISTANT:   ASSISTANTS: BETTY ASHLEY    ANESTHESIA:   spinal  EBL:  25 mL   BLOOD ADMINISTERED:none  DRAINS: none   LOCAL  MEDICATIONS USED:  MARCAINE   , Amount: 30 ml and OTHER EXPAREL  SPECIMEN:  No Specimen  DISPOSITION OF SPECIMEN:  N/A  COUNTS:  YES  TOURNIQUET:   Total Tourniquet Time Documented: Thigh (Right) - 83 minutes Total: Thigh (Right) - 83 minutes   DICTATION: .Viviann Spare Dictation  PLAN OF CARE: Admit to inpatient   PATIENT DISPOSITION:  PACU - hemodynamically stable.   Delay start of Pharmacological VTE agent (>24hrs) due to surgical blood loss or risk of bleeding: yes

## 2018-05-16 NOTE — Anesthesia Procedure Notes (Signed)
Spinal  Patient location during procedure: OR Start time: 05/16/2018 7:35 AM Staffing Anesthesiologist: Nicanor Alcon, MD Resident/CRNA: Mickel Baas, CRNA Performed: anesthesiologist  Preanesthetic Checklist Completed: patient identified, site marked, surgical consent, pre-op evaluation, timeout performed, IV checked, risks and benefits discussed and monitors and equipment checked Spinal Block Patient position: sitting Prep: DuraPrep Patient monitoring: heart rate, cardiac monitor, continuous pulse ox and blood pressure Approach: midline Location: L3-4 Injection technique: single-shot Needle Needle type: Sprotte  Needle gauge: 22 G Needle length: 9 cm Assessment Sensory level: T4

## 2018-05-16 NOTE — Anesthesia Preprocedure Evaluation (Addendum)
Anesthesia Evaluation  Patient identified by MRN, date of birth, ID band Patient awake    Reviewed: Allergy & Precautions, H&P , NPO status , Patient's Chart, lab work & pertinent test results  Airway Mallampati: II  TM Distance: >3 FB Neck ROM: full    Dental no notable dental hx.    Pulmonary sleep apnea , former smoker,    Pulmonary exam normal breath sounds clear to auscultation       Cardiovascular Exercise Tolerance: Good hypertension, negative cardio ROS   Rhythm:regular Rate:Normal     Neuro/Psych negative neurological ROS  negative psych ROS   GI/Hepatic Neg liver ROS, GERD  ,  Endo/Other  negative endocrine ROSdiabetes  Renal/GU Renal disease  negative genitourinary   Musculoskeletal  (+) Arthritis ,   Abdominal   Peds  Hematology negative hematology ROS (+)   Anesthesia Other Findings   Reproductive/Obstetrics negative OB ROS                            Anesthesia Physical Anesthesia Plan  ASA: II  Anesthesia Plan: Spinal   Post-op Pain Management:    Induction:   PONV Risk Score and Plan:   Airway Management Planned:   Additional Equipment:   Intra-op Plan:   Post-operative Plan:   Informed Consent: I have reviewed the patients History and Physical, chart, labs and discussed the procedure including the risks, benefits and alternatives for the proposed anesthesia with the patient or authorized representative who has indicated his/her understanding and acceptance.     Dental Advisory Given  Plan Discussed with: CRNA  Anesthesia Plan Comments:        Anesthesia Quick Evaluation

## 2018-05-16 NOTE — Anesthesia Postprocedure Evaluation (Signed)
Anesthesia Post Note  Patient: [name] [name]  Procedure(s) Performed: TOTAL KNEE ARTHROPLASTY (Right Knee)  Patient location during evaluation: PACU Anesthesia Type: General Level of consciousness: awake and alert Pain management: pain level controlled Vital Signs Assessment: post-procedure vital signs reviewed and stable Respiratory status: spontaneous breathing Cardiovascular status: blood pressure returned to baseline Postop Assessment: no apparent nausea or vomiting, spinal receding, no backache, no headache and patient able to bend at knees Anesthetic complications: no     Last Vitals:  Vitals:   05/16/18 1015 05/16/18 1030  BP: (!) 173/81 (!) 170/79  Pulse: 76 71  Resp: 17 12  Temp:    SpO2: 97% 96%    Last Pain:  Vitals:   05/16/18 1010  TempSrc:   PainSc: 0-No pain                 Nettye Flegal

## 2018-05-16 NOTE — Progress Notes (Signed)
Removed from CPM and sitting on side of bed with legs dangling

## 2018-05-16 NOTE — Anesthesia Procedure Notes (Signed)
Procedure Name: [place] Performed by: Andree Elk Amy A, CRNA Pre-anesthesia Checklist: Patient identified, Emergency Drugs available, Suction available, Timeout performed and Patient being monitored Patient Re-evaluated:Patient Re-evaluated prior to induction Oxygen Delivery Method: Non-rebreather mask

## 2018-05-16 NOTE — Interval H&P Note (Signed)
History and Physical Interval Note:  05/16/2018 7:09 AM  [name] [name]  has presented today for surgery, with the diagnosis of right knee osteoarthritis  The various methods of treatment have been discussed with the patient and family. After consideration of risks, benefits and other options for treatment, the patient has consented to  Procedure(s): TOTAL KNEE ARTHROPLASTY (Right) as a surgical intervention .  The patient's history has been reviewed, patient examined, no change in status, stable for surgery.  I have reviewed the patient's chart and labs.  Questions were answered to the patient's satisfaction.     [name]

## 2018-05-16 NOTE — Transfer of Care (Signed)
Immediate Anesthesia Transfer of Care Note  Patient: [name] [name]  Procedure(s) Performed: TOTAL KNEE ARTHROPLASTY (Right Knee)  Patient Location: PACU  Anesthesia Type:Spinal  Level of Consciousness: awake, alert , oriented and patient cooperative  Airway & Oxygen Therapy: Patient Spontanous Breathing  Post-op Assessment: Report given to RN and Post -op Vital signs reviewed and stable  Post vital signs: Reviewed and stable  Last Vitals:  Vitals Value Taken Time  BP 188/101 05/16/2018  9:38 AM  Temp    Pulse 78 05/16/2018  9:42 AM  Resp 8 05/16/2018  9:42 AM  SpO2 98 % 05/16/2018  9:42 AM  Vitals shown include unvalidated device data.  Last Pain:  Vitals:   05/16/18 0650  TempSrc: Oral  PainSc: 0-No pain      Patients Stated Pain Goal: 8 (68/54/88 3014)  Complications: No apparent anesthesia complications

## 2018-05-16 NOTE — Brief Op Note (Signed)
05/16/2018  9:32 AM  PATIENT:  [name] [name]  68 y.o. male  PRE-OPERATIVE DIAGNOSIS:  right knee osteoarthritis  POST-OPERATIVE DIAGNOSIS:  right knee osteoarthritis  PROCEDURE:  Procedure(s): TOTAL KNEE ARTHROPLASTY (Right)   FINDINGS: MFC GR 4  MED TIB GR 4  P GR 2  MILD VARUS   DEPUY SIGMA FB PS  55F 4T 10 PS POLY, 38 P  SURGEON:  Surgeon(s) and Role:    Carole Civil, MD - Primary     The patient was identified in the preop holding area and the surgical site was confirmed as the right knee. Chart review and update were completed. The patient was taken to the operating room for spinal anesthesia. After successful spinal anesthesia Foley catheter was inserted. The patient was placed supine on the operating table.   the right leg was prepped with DuraPrep and draped sterilely. Timeout was completed. The limb was then exsanguinated a  6 inch Esmarch. The tourniquet was elevated to 300 mmHg.   A midline incision was made and taken down to the extensor mechanism followed by medial arthrotomy. The patella was everted. A synovectomy was performed as needed. The osteophytes were resected.  Anterior cruciate ligament and PCL and medial and lateral meniscus were resected.   a 3/8 inch drill bit was used to enter the femoral canal which was suctioned and irrigated until the fluid was clear. The distal femoral cut was set for 11 millimeter resection with a 5  Right Valgus angle. This cut was completed and checked for flatness.   the femur was then measured to a size 4.    the tibia was subluxated forward and the external alignment guide was placed. We removed 10 mm of bone from the higher  LATERAL side. We set the guide for neutral varus valgus cut related to the  Mechanical axis of the tibia and for slope matching the patient's anatomy. Rotational alignment was set using the tibial tubercle, tibial spine and second metatarsal. The cutting block was pinned and the proximal  tibia was resected.   A spacer block was placed starting with a 10 mm insert to check the extension gap. IT WAS EQUAL AND STABLE    The 4-in-1 femoral cutting block was placed to match the femoral epicondyles and the 4 distal cuts were made.   A spacer block was placed starting with a 10 mm insert to check the flexion and extension gap. The 10 spacer block confirmed proper balance in flexion and extension.   We placed the femoral notch cutting guide size 4  and resected the notch.   Trial implants were placed using appropriate size femur , appropriate size tibial baseplate which was measured after the proximal tibia resection. Tibial rotation was set patella tracking was normal   The tibia was then punched per manufacture technique making sure to avoid internal rotation.   The patella measured a size 24   We resected down to a size 13 using a size 38 X 9 button. Total patella thickness was 22   Final range of motion check was performed with the appropriate size trials as mentioned above. Satisfactory reduction and motion were obtained.   Trial implants were removed. The bone was irrigated and dried and the cement was mixed on the back table  exparel was injected in the soft tissues and posterior capsule of the knee  These implants were then cemented in place. Excess cement was removed. The cement was allowed to cure. Second  irrigation was performed.    FInal range of motion check and stability check was completed  The wound was irrigated t a third time ,Hemovac drain was placed, extensor mechanism was closed with #1 Nurolon followed by 0 Monocryl and staples to reapproximate the skin edges and subcutaneous tissue.   Sterile dressing and cryocuff  was applied  The patient was taken recovery in stable condition        PHYSICIAN ASSISTANT:   ASSISTANTS: BETTY ASHLEY    ANESTHESIA:   spinal  EBL:  25 mL   BLOOD ADMINISTERED:none  DRAINS: none   LOCAL MEDICATIONS USED:   MARCAINE   , Amount: 30 ml and OTHER EXPAREL  SPECIMEN:  No Specimen  DISPOSITION OF SPECIMEN:  N/A  COUNTS:  YES  TOURNIQUET:   Total Tourniquet Time Documented: Thigh (Right) - 83 minutes Total: Thigh (Right) - 83 minutes   DICTATION: .Viviann Spare Dictation  PLAN OF CARE: Admit to inpatient   PATIENT DISPOSITION:  PACU - hemodynamically stable.   Delay start of Pharmacological VTE agent (>24hrs) due to surgical blood loss or risk of bleeding: yes

## 2018-05-16 NOTE — Plan of Care (Signed)
  Problem: Acute Rehab PT Goals(only PT should resolve) Goal: Pt Will Go Supine/Side To Sit Outcome: Progressing Flowsheets (Taken 05/16/2018 1547) Pt will go Supine/Side to Sit: with min guard assist Goal: Patient Will Transfer Sit To/From Stand Outcome: Progressing Flowsheets (Taken 05/16/2018 1547) Patient will transfer sit to/from stand: with min guard assist Goal: Pt Will Transfer Bed To Chair/Chair To Bed Outcome: Progressing Flowsheets (Taken 05/16/2018 1547) Pt will Transfer Bed to Chair/Chair to Bed: min guard assist Goal: Pt Will Ambulate Outcome: Progressing Flowsheets (Taken 05/16/2018 1547) Pt will Ambulate: 100 feet; with min guard assist; with rolling walker   3:48 PM, 05/16/18 Lonell Grandchild, MPT Physical Therapist with [facility] [phone] office [phone] mobile phone

## 2018-05-16 NOTE — Progress Notes (Signed)
From Pacu around 1115 . Alert and oriented, right knee wrapped in ace and cryo cuff in place.   Ambulated small distance with assist of PT and placed on CPM machine.  BP has been elevated even though patient took his lisinopril/hctz this morning before surgery.  He said it has been elevated lately at home and he is going to discuss this with his primary MD.  Texted Dr. Aline Brochure and he ordered apresoline 10 mg B3HD prn for systolic over 897.  Has been able to move right toes, denies pain and numbness.

## 2018-05-16 NOTE — Evaluation (Addendum)
Physical Therapy Evaluation Patient Details Name: [name] [name] MRN: [id number] DOB: [birth year] Today's Date: 05/16/2018   RIGHT KNEE ROM: 5-90 degrees CPM PROM: 0-70 degrees AMBULATION DISTANCE: 35 feet using RW with Min assist   History of Present Illness  [name] [name] a 68 y/o, s/p Right TKA, 05/16/18 with the diagnosis of right knee osteoarthritis  Clinical Impression  Patient instructed in HEP with written instructions provided, patient demonstrates slow labored movement with assistance to move RLE during bed mobility, fair/good return for right heel to toe stepping during gait training without loss of balance, limited secondary to fatigue and later tolerated CPM set up at 0-70 degrees for right knee without c/o increased pain.  Patient will benefit from continued physical therapy in hospital and recommended venue below to increase strength, balance, endurance for safe ADLs and gait.    Follow Up Recommendations SNF    Equipment Recommendations  Rolling walker with 5" wheels    Recommendations for Other Services       Precautions / Restrictions Precautions Precautions: Fall Restrictions Weight Bearing Restrictions: No      Mobility  Bed Mobility Overal bed mobility: Needs Assistance Bed Mobility: Supine to Sit;Sit to Supine     Supine to sit: Min assist Sit to supine: Min assist   General bed mobility comments: requires assistance to move RLE  Transfers Overall transfer level: Needs assistance Equipment used: Rolling walker (2 wheeled) Transfers: Sit to/from [facility] Sit to Stand: Min assist Stand pivot transfers: Min assist       General transfer comment: slightly labored movement  Ambulation/Gait Ambulation/Gait assistance: Min assist Gait Distance (Feet): 35 Feet Assistive device: Rolling walker (2 wheeled) Gait Pattern/deviations: Decreased step length - right;Decreased step length - left;Decreased stride length;Decreased  stance time - right Gait velocity: decreased   General Gait Details: slow slightly labored cadence with fair/good return for right heel to toe stepping, limited secondary to fatigue  Stairs            Wheelchair Mobility    Modified Rankin (Stroke Patients Only)       Balance Overall balance assessment: Needs assistance Sitting-balance support: Feet supported;No upper extremity supported Sitting balance-Leahy Scale: Good     Standing balance support: Bilateral upper extremity supported;During functional activity Standing balance-Leahy Scale: Fair Standing balance comment: using RW                             Pertinent Vitals/Pain Pain Assessment: Faces Faces Pain Scale: Hurts little more Pain Location: right knee with end range flexion Pain Descriptors / Indicators: Sore;Grimacing;Discomfort Pain Intervention(s): Limited activity within patient's tolerance;Monitored during session    [place] expects to be discharged to:: Private residence Living Arrangements: Non-relatives/Friends(roommate) Available Help at Discharge: Friend(s);Available 24 hours/day Type of Home: House Home Access: Stairs to enter Entrance Stairs-Rails: None Entrance Stairs-Number of Steps: 6 Home Layout: One level Home Equipment: Kasandra Knudsen - single point      Prior Engineer, 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, Drives   Independent ADLs           Hand Dominance        Extremity/Trunk Assessment   Upper Extremity Assessment Upper Extremity Assessment: Overall WFL for tasks assessed    Lower Extremity Assessment Lower Extremity Assessment: Generalized weakness;RLE deficits/detail RLE Deficits / Details: grossly -4/5    Cervical / Trunk Assessment Cervical / Trunk Assessment: Normal  Communication   Communication: No  difficulties  Cognition Arousal/Alertness: Awake/alert Behavior During Therapy: WFL for tasks assessed/performed Overall  Cognitive Status: Within Functional Limits for tasks assessed                                        General Comments      Exercises Total Joint Exercises Ankle Circles/Pumps: Supine;AROM;Strengthening;Right;5 reps Quad Sets: Supine;AROM;Strengthening;Right;5 reps Gluteal Sets: Supine;AROM;Strengthening;Both;5 reps Short Arc Quad: Supine;AROM;Strengthening;Right;10 reps Heel Slides: Supine;AROM;Strengthening;Right;10 reps Goniometric ROM: right knee: 5-90 degrees   Assessment/Plan    PT Assessment Patient needs continued PT services  PT Problem List Decreased strength;Decreased range of motion;Decreased activity tolerance;Decreased balance;Decreased mobility       PT Treatment Interventions Gait training;Stair training;Functional mobility training;Therapeutic activities;Patient/family education;Therapeutic exercise    PT Goals (Current goals can be found in the Care Plan section)  Acute Rehab PT Goals Patient Stated Goal: return home after rehab PT Goal Formulation: With patient Time For Goal Achievement: 05/30/18 Potential to Achieve Goals: Good    Frequency 7X/week   Barriers to discharge        Co-evaluation               AM-PAC PT "6 Clicks" Mobility  Outcome Measure Help needed turning from your back to your side while in a flat bed without using bedrails?: A Little Help needed moving from lying on your back to sitting on the side of a flat bed without using bedrails?: A Little Help needed moving to and from a bed to a chair (including a wheelchair)?: A Little Help needed standing up from a chair using your arms (e.g., wheelchair or bedside chair)?: A Little Help needed to walk in hospital room?: A Little Help needed climbing 3-5 steps with a railing? : A Lot 6 Click Score: 17    End of Session   Activity Tolerance: Patient tolerated treatment well;Patient limited by fatigue Patient left: in bed;with call bell/phone within reach Nurse  Communication: Mobility status PT Visit Diagnosis: Unsteadiness on feet (R26.81);Other abnormalities of gait and mobility (R26.89);Muscle weakness (generalized) (M62.81)    Time: 2263-3354 PT Time Calculation (min) (ACUTE ONLY): 35 min   Charges:   PT Evaluation $PT Eval Moderate Complexity: 1 Mod PT Treatments $Therapeutic Activity: 23-37 mins        3:46 PM, 05/16/18 Lonell Grandchild, MPT Physical Therapist with [facility] [phone] office [phone] mobile phone

## 2018-05-17 ENCOUNTER — Encounter (HOSPITAL_COMMUNITY): Payer: Self-pay | Admitting: Orthopedic Surgery

## 2018-05-17 DIAGNOSIS — Z79899 Other long term (current) drug therapy: Secondary | ICD-10-CM | POA: Diagnosis not present

## 2018-05-17 DIAGNOSIS — E785 Hyperlipidemia, unspecified: Secondary | ICD-10-CM | POA: Diagnosis present

## 2018-05-17 DIAGNOSIS — E7800 Pure hypercholesterolemia, unspecified: Secondary | ICD-10-CM | POA: Diagnosis present

## 2018-05-17 DIAGNOSIS — Z8249 Family history of ischemic heart disease and other diseases of the circulatory system: Secondary | ICD-10-CM | POA: Diagnosis not present

## 2018-05-17 DIAGNOSIS — E1122 Type 2 diabetes mellitus with diabetic chronic kidney disease: Secondary | ICD-10-CM | POA: Diagnosis present

## 2018-05-17 DIAGNOSIS — N189 Chronic kidney disease, unspecified: Secondary | ICD-10-CM | POA: Diagnosis present

## 2018-05-17 DIAGNOSIS — Z888 Allergy status to other drugs, medicaments and biological substances status: Secondary | ICD-10-CM | POA: Diagnosis not present

## 2018-05-17 DIAGNOSIS — Z791 Long term (current) use of non-steroidal anti-inflammatories (NSAID): Secondary | ICD-10-CM | POA: Diagnosis not present

## 2018-05-17 DIAGNOSIS — M25561 Pain in right knee: Secondary | ICD-10-CM | POA: Diagnosis present

## 2018-05-17 DIAGNOSIS — K219 Gastro-esophageal reflux disease without esophagitis: Secondary | ICD-10-CM | POA: Diagnosis present

## 2018-05-17 DIAGNOSIS — M170 Bilateral primary osteoarthritis of knee: Secondary | ICD-10-CM | POA: Diagnosis present

## 2018-05-17 DIAGNOSIS — Z833 Family history of diabetes mellitus: Secondary | ICD-10-CM | POA: Diagnosis not present

## 2018-05-17 DIAGNOSIS — Z87891 Personal history of nicotine dependence: Secondary | ICD-10-CM | POA: Diagnosis not present

## 2018-05-17 DIAGNOSIS — I700 Atherosclerosis of aorta: Secondary | ICD-10-CM | POA: Diagnosis present

## 2018-05-17 DIAGNOSIS — I129 Hypertensive chronic kidney disease with stage 1 through stage 4 chronic kidney disease, or unspecified chronic kidney disease: Secondary | ICD-10-CM | POA: Diagnosis present

## 2018-05-17 DIAGNOSIS — M109 Gout, unspecified: Secondary | ICD-10-CM | POA: Diagnosis present

## 2018-05-17 DIAGNOSIS — Z7982 Long term (current) use of aspirin: Secondary | ICD-10-CM | POA: Diagnosis not present

## 2018-05-17 DIAGNOSIS — Z8546 Personal history of malignant neoplasm of prostate: Secondary | ICD-10-CM | POA: Diagnosis not present

## 2018-05-17 DIAGNOSIS — Z96641 Presence of right artificial hip joint: Secondary | ICD-10-CM | POA: Diagnosis present

## 2018-05-17 DIAGNOSIS — M1990 Unspecified osteoarthritis, unspecified site: Secondary | ICD-10-CM | POA: Diagnosis present

## 2018-05-17 LAB — BASIC METABOLIC PANEL
Anion gap: 8 (ref 5–15)
BUN: 13 mg/dL (ref 8–23)
CO2: 30 mmol/L (ref 22–32)
Calcium: 8.3 mg/dL — ABNORMAL LOW (ref 8.9–10.3)
Chloride: 94 mmol/L — ABNORMAL LOW (ref 98–111)
Creatinine, Ser: 1.03 mg/dL (ref 0.61–1.24)
GFR calc Af Amer: >60 mL/min (ref >60)
GFR calc non Af Amer: >60 mL/min (ref >60)
Glucose, Bld: 105 mg/dL — ABNORMAL HIGH (ref 70–99)
Potassium: 4.2 mmol/L (ref 3.5–5.1)
Sodium: 132 mmol/L — ABNORMAL LOW (ref 135–145)

## 2018-05-17 LAB — CBC
HCT: 38.7 % — ABNORMAL LOW (ref 39.0–52.0)
Hemoglobin: 12.5 g/dL — ABNORMAL LOW (ref 13.0–17.0)
MCH: 28.1 pg (ref 26.0–34.0)
MCHC: 32.3 g/dL (ref 30.0–36.0)
MCV: 87 fL (ref 80.0–100.0)
Platelets: 219 10*3/uL (ref 150–400)
RBC: 4.45 MIL/uL (ref 4.22–5.81)
RDW: 13.1 % (ref 11.5–15.5)
WBC: 8.7 10*3/uL (ref 4.0–10.5)
nRBC: 0 % (ref 0.0–0.2)

## 2018-05-17 LAB — BPAM RBC
Blood Product Expiration Date: 202003192359
Blood Product Expiration Date: 202003192359
ISSUE DATE / TIME: 202002261718
ISSUE DATE / TIME: 202002262253
Unit Type and Rh: 5100
Unit Type and Rh: 5100

## 2018-05-17 LAB — TYPE AND SCREEN
ABO/RH(D): O POS
Antibody Screen: NEGATIVE
UNIT DIVISION: 0
Unit division: 0

## 2018-05-17 NOTE — Care Management Note (Signed)
Case Management Note  Patient Details  Name: [name] [name] MRN: [id number] Date of Birth: 1950/10/19  Subjective/Objective:         TKA.           Action/Plan: Briefly discussed home health options/DME with patient should his insurance deny SNF placement.  He does have a room mate at home that will be available to assist if needed.  Will await insurance decision. CM will follow.    Expected Discharge Date:      05/19/18            Expected Discharge Plan:  Skilled Nursing Facility  In-House Referral:  Clinical Social Work  Discharge planning Services  CM Consult  Post Acute Care Choice:    Choice offered to:     DME Arranged:    DME Agency:     HH Arranged:    [place] Agency:     Status of Service:  In process, will continue to follow  If discussed at Long Length of Stay Meetings, dates discussed:    Additional Comments:  Rakeisha Nyce, Chauncey Reading, RN 05/17/2018, 1:58 PM

## 2018-05-17 NOTE — Progress Notes (Signed)
Physical Therapy Treatment Patient Details Name: [name] [name] MRN: [id number] DOB: 05-06-1950 Today's Date: 05/17/2018  RIGHT KNEE ROM: 0-100 degrees CPM: 0-90 degrees AMBULATION DISTANCE: 75 feet using RW with Min guard assist     History of Present Illness [name] [name] a 68 y/o, s/p Right TKA, 05/16/18 with the diagnosis of right knee osteoarthritis    PT Comments    Patient demonstrates increased endurance/distance for gait training with fair/good return for right heel to toe stepping, no loss of balance, limited secondary to fatigue, able to achieve increased right knee flexion self stretching with RLE dangling at bedside up to 100 degrees, limited due end range stiffness.  Patient tolerated sitting up in chair after therapy with RLE dangling.  Patient will benefit from continued physical therapy in hospital and recommended venue below to increase strength, balance, endurance for safe ADLs and gait.   Follow Up Recommendations  SNF     Equipment Recommendations  Rolling walker with 5" wheels    Recommendations for Other Services       Precautions / Restrictions Precautions Precautions: Fall Restrictions Weight Bearing Restrictions: No    Mobility  Bed Mobility Overal bed mobility: Needs Assistance Bed Mobility: Supine to Sit     Supine to sit: Min guard     General bed mobility comments: demonstrates improvement for moving RLE during supine to sitting  Transfers Overall transfer level: Needs assistance Equipment used: Rolling walker (2 wheeled) Transfers: Sit to/from [facility] Sit to Stand: Min guard Stand pivot transfers: Min guard       General transfer comment: increased RLE strength for sit to stands and transfers  Ambulation/Gait Ambulation/Gait assistance: Min guard Gait Distance (Feet): 75 Feet Assistive device: Rolling walker (2 wheeled) Gait Pattern/deviations: Decreased step length - right;Decreased stance time -  right;Decreased stride length Gait velocity: decreased   General Gait Details: demonstrates increased endurance/distance for ambulation with slow slightly labored cadence with fair/good return for right heel to toe stepping, no loss of balance   Stairs             Wheelchair Mobility    Modified Rankin (Stroke Patients Only)       Balance Overall balance assessment: Needs assistance Sitting-balance support: Feet supported;No upper extremity supported Sitting balance-Leahy Scale: Good     Standing balance support: Bilateral upper extremity supported;During functional activity Standing balance-Leahy Scale: Fair Standing balance comment: using RW                            Cognition Arousal/Alertness: Awake/alert Behavior During Therapy: WFL for tasks assessed/performed Overall Cognitive Status: Within Functional Limits for tasks assessed                                        Exercises Total Joint Exercises Ankle Circles/Pumps: Supine;AROM;Strengthening;10 reps;Both Quad Sets: Supine;AROM;Strengthening;10 reps;Right Short Arc Quad: Supine;AROM;Strengthening;Right;10 reps Heel Slides: Supine;AROM;Strengthening;Right;10 reps Goniometric ROM: right knee: 0-100 degrees    General Comments        Pertinent Vitals/Pain Pain Assessment: Faces Faces Pain Scale: Hurts a little bit Pain Location: right knee with end range flexion Pain Descriptors / Indicators: Sore Pain Intervention(s): Limited activity within patient's tolerance;Monitored during session    Home Living  Prior Function            PT Goals (current goals can now be found in the care plan section) Acute Rehab PT Goals Patient Stated Goal: return home after rehab PT Goal Formulation: With patient Time For Goal Achievement: 05/30/18 Potential to Achieve Goals: Good Progress towards PT goals: Progressing toward goals    Frequency     7X/week      PT Plan Current plan remains appropriate    Co-evaluation              AM-PAC PT "6 Clicks" Mobility   Outcome Measure  Help needed turning from your back to your side while in a flat bed without using bedrails?: None Help needed moving from lying on your back to sitting on the side of a flat bed without using bedrails?: A Little Help needed moving to and from a bed to a chair (including a wheelchair)?: A Little Help needed standing up from a chair using your arms (e.g., wheelchair or bedside chair)?: A Little Help needed to walk in hospital room?: A Little Help needed climbing 3-5 steps with a railing? : A Lot 6 Click Score: 18    End of Session   Activity Tolerance: Patient tolerated treatment well;Patient limited by fatigue Patient left: in chair;with call bell/phone within reach Nurse Communication: Mobility status PT Visit Diagnosis: Unsteadiness on feet (R26.81);Other abnormalities of gait and mobility (R26.89);Muscle weakness (generalized) (M62.81)     Time: 0930-1002 PT Time Calculation (min) (ACUTE ONLY): 32 min  Charges:  $Gait Training: 8-22 mins $Therapeutic Exercise: 8-22 mins                     12:26 PM, 05/17/18 Lonell Grandchild, MPT Physical Therapist with [facility] 336 [phone] office [phone] mobile phone

## 2018-05-17 NOTE — Progress Notes (Signed)
Patient ID: [name] [name], male   DOB: 05/30/1950, 68 y.o.   MRN: [id number]  Postoperative day 1 status post right total knee  BP (!) 161/79 (BP Location: Right Arm)   Pulse 80   Temp 98.1 F (36.7 C) (Oral)   Resp 18   Ht 5\' 5"  (1.651 m)   Wt 101.4 kg   SpO2 95%   BMI 37.20 kg/m   He did have a blood pressure issue yesterday blood pressure seems to be good today with moderate elevation, currently using hydralazine as needed  CBC Latest Ref Rng & Units 05/17/2018 05/11/2018 04/05/2018  WBC 4.0 - 10.5 K/uL 8.7 6.2 7.3  Hemoglobin 13.0 - 17.0 g/dL 12.5(L) 13.3 13.8  Hematocrit 39.0 - 52.0 % 38.7(L) 40.9 40.2  Platelets 150 - 400 K/uL 219 222 269   BMP Latest Ref Rng & Units 05/17/2018 05/11/2018 04/05/2018  Glucose 70 - 99 mg/dL 105(H) 107(H) 110(H)  BUN 8 - 23 mg/dL 13 18 14   Creatinine 0.61 - 1.24 mg/dL 1.03 1.03 1.09  BUN/Creat Ratio 10 - 24 - - 13  Sodium 135 - 145 mmol/L 132(L) 133(L) 136  Potassium 3.5 - 5.1 mmol/L 4.2 4.4 5.1  Chloride 98 - 111 mmol/L 94(L) 95(L) 96  CO2 22 - 32 mmol/L 30 28 25   Calcium 8.9 - 10.3 mg/dL 8.3(L) 8.6(L) 9.1   The patient has stairs at his home and would like to go to rehabilitation for 2 weeks

## 2018-05-17 NOTE — Clinical Social Work Placement (Signed)
   CLINICAL SOCIAL WORK PLACEMENT  NOTE  Date:  05/17/2018  Patient Details  Name: [name] [name] MRN: [id number] Date of Birth: 05-26-1950  Clinical Social Work is seeking post-discharge placement for this patient at the [place] level of care (*CSW will initial, date and re-position this form in  chart as items are completed):  Yes   Patient/family provided with [facility] Work Department's list of facilities offering this level of care within the geographic area requested by the patient (or if unable, by the patient's family).  Yes   Patient/family informed of their freedom to choose among providers that offer the needed level of care, that participate in Medicare, Medicaid or managed care program needed by the patient, have an available bed and are willing to accept the patient.  Yes   Patient/family informed of [facility]'s ownership interest in [facility] and [facility], as well as of the fact that they are under no obligation to receive care at these facilities.  PASRR submitted to EDS on 05/17/18     PASRR number received on 05/17/18     Existing PASRR number confirmed on       FL2 transmitted to all facilities in geographic area requested by pt/family on 05/17/18     FL2 transmitted to all facilities within larger geographic area on       Patient informed that his/her managed care company has contracts with or will negotiate with certain facilities, including the following:            Patient/family informed of bed offers received.  Patient chooses bed at       Physician recommends and patient chooses bed at      Patient to be transferred to   on  .  Patient to be transferred to facility by       Patient family notified on   of transfer.  Name of family member notified:        PHYSICIAN       Additional Comment: Pt identifies [facility] as first choice, [facility] as second, [facility] as  third.   _______________________________________________ Trish Mage, LCSW 05/17/2018, 11:46 AM

## 2018-05-17 NOTE — Plan of Care (Signed)

## 2018-05-17 NOTE — NC FL2 (Signed)
[place] MEDICAID FL2 LEVEL OF CARE SCREENING TOOL     IDENTIFICATION  Patient Name: [name] [name] Birthdate: [birth year] Sex: male Admission Date (Current Location): 05/16/2018  [facility] and [place] Number:  [employer] and Address:  [place] [address] [place]      Provider Number: [id number]  Attending Physician Name and Address:  Carole Civil, MD  Relative Name and Phone Number:       Current Level of Care: Hospital Recommended Level of Care: [place] Prior Approval Number:    Date Approved/Denied:   PASRR Number: [id number]  Discharge Plan: SNF    Current Diagnoses: Patient Active Problem List   Diagnosis Date Noted  . Primary osteoarthritis of knee 05/16/2018  . Aortic atherosclerosis ([place]) 10/19/2016  . HTN (hypertension) 12/10/2013  . Arthritis of right hip 04/20/2013  . Prediabetes 03/26/2013  . Hyperlipidemia 03/26/2013  . Microproteinuria 03/26/2013  . Night sweats 03/26/2013  . Gout 03/26/2013  . Office hypertension 03/09/2013  . Osteoarthritis of right knee 11/23/2012  . Osteoarthritis of hip 09/05/2012  . Back pain 09/05/2012  . Prostate cancer ([place]) 04/20/2011    Orientation RESPIRATION BLADDER Height & Weight     Self, Time, Situation, Place  Normal Continent Weight: 101.4 kg Height:  5\' 5"  (165.1 cm)  BEHAVIORAL SYMPTOMS/MOOD NEUROLOGICAL BOWEL NUTRITION STATUS  (none) (none) Continent (Regular)  AMBULATORY STATUS COMMUNICATION OF NEEDS Skin   Extensive Assist Verbally Surgical wounds(R Knee)                       Personal Care Assistance Level of Assistance  Bathing, Feeding, Dressing Bathing Assistance: Limited assistance Feeding assistance: Independent Dressing Assistance: Limited assistance     Functional Limitations Info  Sight, Hearing, Speech Sight Info: Adequate Hearing Info: Adequate Speech Info: Adequate    SPECIAL CARE FACTORS FREQUENCY  PT  (By licensed PT)     PT Frequency: 5X/W              Contractures Contractures Info: Not present    Additional Factors Info  Code Status, Allergies Code Status Info: Full Allergies Info: Lipitor           Current Medications (05/17/2018):  This is the current hospital active medication list Current Facility-Administered Medications  Medication Dose Route Frequency Provider Last Rate Last Dose  . allopurinol (ZYLOPRIM) tablet 100 mg  100 mg Oral Daily Carole Civil, MD   100 mg at 05/17/18 0835  . alum & mag hydroxide-simeth (MAALOX/MYLANTA) 200-200-20 MG/5ML suspension 30 mL  30 mL Oral Q4H PRN Carole Civil, MD      . aspirin EC tablet 325 mg  325 mg Oral Q breakfast Carole Civil, MD   325 mg at 05/17/18 0835  . atorvastatin (LIPITOR) tablet 40 mg  40 mg Oral Daily Carole Civil, MD   40 mg at 05/17/18 9563  . celecoxib (CELEBREX) capsule 200 mg  200 mg Oral BID Carole Civil, MD   200 mg at 05/17/18 8756  . dexamethasone (DECADRON) injection 10 mg  10 mg Intravenous Once Carole Civil, MD      . diphenhydrAMINE (BENADRYL) 12.5 MG/5ML elixir 12.5-25 mg  12.5-25 mg Oral Q4H PRN Carole Civil, MD      . docusate sodium (COLACE) capsule 100 mg  100 mg Oral BID Carole Civil, MD   100 mg at 05/17/18 0835  . gabapentin (NEURONTIN)  capsule 300 mg  300 mg Oral TID Carole Civil, MD   300 mg at 05/17/18 1423  . hydrALAZINE (APRESOLINE) injection 10 mg  10 mg Intravenous Q6H PRN Carole Civil, MD   10 mg at 05/16/18 1324  . lisinopril (PRINIVIL,ZESTRIL) tablet 20 mg  20 mg Oral Daily Carole Civil, MD   20 mg at 05/17/18 9532   And  . hydrochlorothiazide (HYDRODIURIL) tablet 25 mg  25 mg Oral Daily Carole Civil, MD   25 mg at 05/17/18 0836  . HYDROcodone-acetaminophen (NORCO) 7.5-325 MG per tablet 1-2 tablet  1-2 tablet Oral Q4H PRN Carole Civil, MD      . menthol-cetylpyridinium (CEPACOL) lozenge 3 mg  1  lozenge Oral PRN Carole Civil, MD       Or  . phenol (CHLORASEPTIC) mouth spray 1 spray  1 spray Mouth/Throat PRN Carole Civil, MD      . methocarbamol (ROBAXIN) tablet 500 mg  500 mg Oral Q6H PRN Carole Civil, MD       Or  . methocarbamol (ROBAXIN) 500 mg in dextrose 5 % 50 mL IVPB  500 mg Intravenous Q6H PRN Carole Civil, MD      . metoCLOPramide (REGLAN) tablet 5-10 mg  5-10 mg Oral Q8H PRN Carole Civil, MD       Or  . metoCLOPramide (REGLAN) injection 5-10 mg  5-10 mg Intravenous Q8H PRN Carole Civil, MD      . morphine 2 MG/ML injection 0.5-1 mg  0.5-1 mg Intravenous Q2H PRN Carole Civil, MD      . multivitamin with minerals tablet 1 tablet  1 tablet Oral Daily Carole Civil, MD   1 tablet at 05/17/18 [phone]  . ondansetron (ZOFRAN) tablet 4 mg  4 mg Oral Q6H PRN Carole Civil, MD       Or  . ondansetron [facility]) injection 4 mg  4 mg Intravenous Q6H PRN Carole Civil, MD      . pantoprazole (PROTONIX) EC tablet 40 mg  40 mg Oral Daily Carole Civil, MD   40 mg at 05/17/18 0834  . senna-docusate (Senokot-S) tablet 1 tablet  1 tablet Oral QHS PRN Carole Civil, MD      . traMADol [facility]) tablet 50 mg  50 mg Oral Q6H Carole Civil, MD   50 mg at 05/17/18 4356     Discharge Medications: Please see discharge summary for a list of discharge medications.  Relevant Imaging Results:  Relevant Lab Results:   Additional Information [place],

## 2018-05-17 NOTE — Clinical Social Work Note (Addendum)
Clinical Social Work Assessment  Patient Details  Name: [name] [name] MRN: [id number] Date of Birth: [birth year]  Date of referral:  05/17/18               Reason for consult:  Facility Placement                Permission sought to share information with:    Permission granted to share information::     Name::        Agency::     Relationship::     Contact Information:     Housing/Transportation Living arrangements for the past 2 months:  Mobile Home Source of Information:  Patient Patient Interpreter Needed:  None Criminal Activity/Legal Involvement Pertinent to Current Situation/Hospitalization:  No - Comment as needed Significant Relationships:  Friend Lives with:  Friends Do you feel safe going back to the place where you live?  No Need for family participation in patient care:  No (Coment)  Care giving concerns:  Pt is worried about returning home as there are 5 steps leading to the door of his mobile home.   Social Worker assessment / plan:  68 YO Caucasian male in hospital status post R knee replacement surgery.  Although patient was informed that a physical therapist could come to his home to work with him, pt prefers referral to SNF for rehab.  States he had hip replacement surgery in past, and went home that time, but he was younger then.  Has a cane at home, no other DME.  Employment status:  Retired Nurse, adult PT Recommendations:  [place] / Referral to community resources:     Patient/Family's Response to care:  Appreciative  Patient/Family's Understanding of and Emotional Response to Diagnosis, Current Treatment, and Prognosis:  Understands, appears nervous about going home from hospital-concerned about falling using stairs  Pt understands that he needs to be prepared to return home should insurance deny rehab.  Emotional Assessment Appearance:  Appears younger than stated age Attitude/Demeanor/Rapport:   Engaged Affect (typically observed):  Appropriate Orientation:  Oriented to Self, Oriented to Situation, Oriented to Place, Oriented to  Time Alcohol / Substance use:  Not Applicable Psych involvement (Current and /or in the community):  No (Comment)  Discharge Needs  Concerns to be addressed:  Discharge Planning Concerns Readmission within the last 30 days:  No Current discharge risk:  None Barriers to Discharge:  No SNF bed   Trish Mage, LCSW 05/17/2018, 11:38 AM

## 2018-05-17 NOTE — Care Management Obs Status (Deleted)
[facility] NOTIFICATION   Patient Details  Name: [name] [name] MRN: [id number] Date of Birth: Feb 19, 1951   Medicare Observation Status Notification Given:  Yes    Coral Timme, Chauncey Reading, RN 05/17/2018, 1:01 PM

## 2018-05-18 NOTE — Progress Notes (Signed)
Physical Therapy Treatment Patient Details Name: [name] [name] MRN: [id number] DOB: Dec 18, 1950 Today's Date: 05/18/2018  RIGHT KNEE ROM: 5-95 degrees CPM PROM: 0-90 degrees AMBULATION DISTANCE: 80 feet using RW with Min guard assist    History of Present Illness [name] [name] a 68 y/o, s/p Right TKA, 05/16/18 with the diagnosis of right knee osteoarthritis    PT Comments    Patient limited for right knee flexion due to increased pain/stiffness with end range movement, slightly increased endurance/distance for gait training, but limited due to c/o fatigue and increasing right knee pain.  Patient continued sitting up in chair after therapy and requested BLE elevated.  Patient will benefit from continued physical therapy in hospital and recommended venue below to increase strength, balance, endurance for safe ADLs and gait.   Follow Up Recommendations  SNF     Equipment Recommendations  Rolling walker with 5" wheels    Recommendations for Other Services       Precautions / Restrictions Precautions Precautions: Fall Restrictions Weight Bearing Restrictions: No    Mobility  Bed Mobility               General bed mobility comments: Patient presents seated in chair with legs elevated (assisted by nursing staff)  Transfers Overall transfer level: Needs assistance Equipment used: Rolling walker (2 wheeled) Transfers: Sit to/from [facility] Sit to Stand: Min guard Stand pivot transfers: Min guard       General transfer comment: labored movement for sit to stands with verbal cues for proper hand placement  Ambulation/Gait Ambulation/Gait assistance: Min guard Gait Distance (Feet): 80 Feet Assistive device: Rolling walker (2 wheeled) Gait Pattern/deviations: Decreased step length - right;Decreased stance time - right;Decreased stride length Gait velocity: decreased   General Gait Details: slightly increased endurance/distance for ambulation  with slow labored cadence with fair/good return for right heel to toe stepping, limited secondary to fatigue and increasing right knee pain   Stairs             Wheelchair Mobility    Modified Rankin (Stroke Patients Only)       Balance Overall balance assessment: Needs assistance Sitting-balance support: Feet supported;No upper extremity supported Sitting balance-Leahy Scale: Good     Standing balance support: Bilateral upper extremity supported;During functional activity Standing balance-Leahy Scale: Fair Standing balance comment: using RW                            Cognition Arousal/Alertness: Awake/alert Behavior During Therapy: WFL for tasks assessed/performed Overall Cognitive Status: Within Functional Limits for tasks assessed                                        Exercises Total Joint Exercises Ankle Circles/Pumps: Supine;AROM;Strengthening;10 reps;Both Quad Sets: Supine;AROM;Strengthening;10 reps;Right Short Arc Quad: Supine;AROM;Strengthening;Right;10 reps Goniometric ROM: right knee: 5-95 degrees    General Comments        Pertinent Vitals/Pain Pain Assessment: Faces Faces Pain Scale: Hurts even more Pain Location: right knee with end range flexion Pain Descriptors / Indicators: Sore;Tightness Pain Intervention(s): Limited activity within patient's tolerance;Monitored during session    Home Living                      Prior Function            PT Goals (current goals can  now be found in the care plan section) Acute Rehab PT Goals Patient Stated Goal: return home after rehab PT Goal Formulation: With patient Time For Goal Achievement: 05/30/18 Potential to Achieve Goals: Good Progress towards PT goals: Progressing toward goals    Frequency    7X/week      PT Plan Current plan remains appropriate    Co-evaluation              AM-PAC PT "6 Clicks" Mobility   Outcome Measure  Help  needed turning from your back to your side while in a flat bed without using bedrails?: None Help needed moving from lying on your back to sitting on the side of a flat bed without using bedrails?: A Little Help needed moving to and from a bed to a chair (including a wheelchair)?: A Little Help needed standing up from a chair using your arms (e.g., wheelchair or bedside chair)?: A Little Help needed to walk in hospital room?: A Little Help needed climbing 3-5 steps with a railing? : A Lot 6 Click Score: 18    End of Session   Activity Tolerance: Patient tolerated treatment well;Patient limited by fatigue;Patient limited by pain Patient left: in chair;with call bell/phone within reach Nurse Communication: Mobility status PT Visit Diagnosis: Unsteadiness on feet (R26.81);Other abnormalities of gait and mobility (R26.89);Muscle weakness (generalized) (M62.81)     Time: 0300-9233 PT Time Calculation (min) (ACUTE ONLY): 25 min  Charges:  $Gait Training: 8-22 mins $Therapeutic Exercise: 8-22 mins                     2:02 PM, 05/18/18 Lonell Grandchild, MPT Physical Therapist with [facility] 336 [phone] office [phone] mobile phone

## 2018-05-18 NOTE — Progress Notes (Signed)
Patient ID: NED KAKAR, male   DOB: 1950-12-15, 68 y.o.   MRN: [id number] POD 2 RIGHT TKA   BP (!) 193/91 (BP Location: Right Arm)   Pulse 84   Temp 97.8 F (36.6 C) (Oral)   Resp 20   Ht 5\' 5"  (1.651 m)   Wt 101.4 kg   SpO2 99%   BMI 37.20 kg/m   INCISION CLEAN AND DRESSING WAS CHANGED   CALF IS SOFT LEG NO SWELLING   OK TO DISCHARGE NOW WHEN BED AVAILABLE

## 2018-05-18 NOTE — Plan of Care (Signed)

## 2018-05-19 ENCOUNTER — Inpatient Hospital Stay
Admission: RE | Admit: 2018-05-19 | Discharge: 2018-05-29 | Disposition: A | Discharge status: Home / Self Care | Payer: Medicare HMO | Source: Ambulatory Visit | Attending: Internal Medicine | Admitting: Internal Medicine

## 2018-05-19 DIAGNOSIS — I2510 Atherosclerotic heart disease of native coronary artery without angina pectoris: Secondary | ICD-10-CM | POA: Diagnosis not present

## 2018-05-19 DIAGNOSIS — E1121 Type 2 diabetes mellitus with diabetic nephropathy: Secondary | ICD-10-CM | POA: Diagnosis not present

## 2018-05-19 DIAGNOSIS — M6281 Muscle weakness (generalized): Secondary | ICD-10-CM | POA: Diagnosis not present

## 2018-05-19 DIAGNOSIS — M1A9XX0 Chronic gout, unspecified, without tophus (tophi): Secondary | ICD-10-CM | POA: Diagnosis not present

## 2018-05-19 DIAGNOSIS — M1000 Idiopathic gout, unspecified site: Secondary | ICD-10-CM | POA: Diagnosis not present

## 2018-05-19 DIAGNOSIS — M545 Low back pain: Secondary | ICD-10-CM | POA: Diagnosis not present

## 2018-05-19 DIAGNOSIS — E785 Hyperlipidemia, unspecified: Secondary | ICD-10-CM | POA: Diagnosis not present

## 2018-05-19 DIAGNOSIS — Z96651 Presence of right artificial knee joint: Secondary | ICD-10-CM | POA: Diagnosis not present

## 2018-05-19 DIAGNOSIS — I700 Atherosclerosis of aorta: Secondary | ICD-10-CM | POA: Diagnosis not present

## 2018-05-19 DIAGNOSIS — Z6832 Body mass index (BMI) 32.0-32.9, adult: Secondary | ICD-10-CM | POA: Diagnosis not present

## 2018-05-19 DIAGNOSIS — N189 Chronic kidney disease, unspecified: Secondary | ICD-10-CM | POA: Diagnosis not present

## 2018-05-19 DIAGNOSIS — M1711 Unilateral primary osteoarthritis, right knee: Secondary | ICD-10-CM | POA: Diagnosis not present

## 2018-05-19 DIAGNOSIS — I10 Essential (primary) hypertension: Secondary | ICD-10-CM | POA: Diagnosis not present

## 2018-05-19 DIAGNOSIS — L03115 Cellulitis of right lower limb: Secondary | ICD-10-CM | POA: Diagnosis not present

## 2018-05-19 DIAGNOSIS — Z96642 Presence of left artificial hip joint: Secondary | ICD-10-CM | POA: Diagnosis not present

## 2018-05-19 DIAGNOSIS — Z471 Aftercare following joint replacement surgery: Secondary | ICD-10-CM | POA: Diagnosis not present

## 2018-05-19 DIAGNOSIS — K219 Gastro-esophageal reflux disease without esophagitis: Secondary | ICD-10-CM | POA: Diagnosis not present

## 2018-05-19 DIAGNOSIS — I129 Hypertensive chronic kidney disease with stage 1 through stage 4 chronic kidney disease, or unspecified chronic kidney disease: Secondary | ICD-10-CM | POA: Diagnosis not present

## 2018-05-19 DIAGNOSIS — K5903 Drug induced constipation: Secondary | ICD-10-CM | POA: Diagnosis not present

## 2018-05-19 MED ORDER — SENNOSIDES-DOCUSATE SODIUM 8.6-50 MG PO TABS: 1.0000 | ORAL_TABLET | Freq: Every evening | ORAL | 0 refills | Status: DC | PRN

## 2018-05-19 MED ORDER — HYDROCODONE-ACETAMINOPHEN 7.5-325 MG PO TABS: 1.0000 | ORAL_TABLET | ORAL | 0 refills | Status: DC | PRN

## 2018-05-19 MED ORDER — MAGNESIUM HYDROXIDE 400 MG/5ML PO SUSP: 30.0000 mL | Freq: Once | ORAL | Status: DC

## 2018-05-19 MED ORDER — METHOCARBAMOL 500 MG PO TABS: 500.0000 mg | ORAL_TABLET | Freq: Four times a day (QID) | ORAL | 0 refills | Status: DC | PRN

## 2018-05-19 MED ORDER — GABAPENTIN 300 MG PO CAPS: 300.0000 mg | ORAL_CAPSULE | Freq: Three times a day (TID) | ORAL | 0 refills | Status: DC

## 2018-05-19 NOTE — Care Management Important Message (Signed)
Important Message  Patient Details  Name: [name] [name] MRN: [id number] Date of Birth: November 30, 1950   Medicare Important Message Given:  Yes    Sloan Takagi, Chauncey Reading, RN 05/19/2018, 2:30 PM

## 2018-05-19 NOTE — Progress Notes (Signed)
IV discontinued ,catheter intact. Patient discharged to [facility] skilled facility. Report called and given to Stefanie Libel LPN. EMS to transport patient to awaiting facility.

## 2018-05-19 NOTE — Discharge Summary (Signed)
Physician Discharge Summary  Patient ID: [name] [name] MRN: [id number] DOB/AGE: [birth year] 68 y.o.  Admit date: 05/16/2018 Discharge date: 05/19/2018  Admission Diagnoses: Primary osteoarthritis right knee  Discharge Diagnoses: Same  Discharged Condition: good  Procedure: Right total knee.  [employer] fixed bearing PS total knee  Hospital Course:  On February 25 the patient underwent uncomplicated right total knee arthroplasty  He was up with therapy the first day ambulated well  On postop day 1 February 26 he did well with therapy again progressively increased his flexion and ambulation pain was under control.  He stayed in the hospital waiting for [facility] to approve his discharge to rehab    On postop day 3 the 28th he was approved to go to rehab  Discharge Exam: Blood pressure (!) 152/66, pulse 88, temperature 98.1 F (36.7 C), temperature source Oral, resp. rate 18, height 5\' 5"  (1.651 m), weight 101.4 kg, SpO2 94 %. He is awake alert and oriented x3 mood and affect are normal he is ambulatory with a walker he is weightbearing as tolerated his wound is clean his calf is supple his Homans sign is negative  Disposition: Discharge disposition: 01-Home or Self Care       Skilled nursing facility  Discharge Instructions    CPM   Complete by:  As directed    Continuous passive motion machine (CPM):      Use the CPM from 0 to 90 for 4 hours per day.      You may increase by 10 per day.  You may break it up into 2 or 3 sessions per day.      Use CPM for 2 weeks or until you are told to stop.   Call MD / Call 911   Complete by:  As directed    If you experience chest pain or shortness of breath, CALL 911 and be transported to the hospital emergency room.  If you develope a fever above 101 F, pus (white drainage) or increased drainage or redness at the wound, or calf pain, call your surgeon's office.   Change dressing   Complete by:  As directed    Change dressing on  march 3 then change the dressing daily with sterile 4 x 4 inch gauze dressing and apply TED hose.  You may clean the incision with alcohol prior to redressing.   Constipation Prevention   Complete by:  As directed    Drink plenty of fluids.  Prune juice may be helpful.  You may use a stool softener, such as Colace (over the counter) 100 mg twice a day.  Use MiraLax (over the counter) for constipation as needed.   Diet - low sodium heart healthy   Complete by:  As directed    Discharge instructions   Complete by:  As directed    Bone foam 30 min 3 x a day  Ice cuff 30 min 4 x a day may increase as needed for swelling   Do not put a pillow under the knee. Place it under the heel.   Complete by:  As directed    Increase activity slowly as tolerated   Complete by:  As directed    TED hose   Complete by:  As directed    Use stockings (TED hose) for 2 weeks on both leg(s).  You may remove them at night for sleeping.     Allergies as of 05/19/2018      Reactions   Lipitor [atorvastatin]  Other (See Comments)   Myalgia      Medication List    STOP taking these medications   naproxen sodium 220 MG tablet Commonly known as:  ALEVE     TAKE these medications   allopurinol 100 MG tablet Commonly known as:  ZYLOPRIM Take 1 tablet (100 mg total) by mouth daily.   aspirin 81 MG tablet Take 81 mg by mouth daily.   atorvastatin 40 MG tablet Commonly known as:  LIPITOR Take 1 tablet (40 mg total) by mouth daily.   gabapentin 300 MG capsule Commonly known as:  NEURONTIN Take 1 capsule (300 mg total) by mouth 3 (three) times daily.   HYDROcodone-acetaminophen 7.5-325 MG tablet Commonly known as:  NORCO Take 1 tablet by mouth every 4 (four) hours as needed for moderate pain.   lisinopril-hydrochlorothiazide 20-25 MG tablet Commonly known as:  PRINZIDE,ZESTORETIC Take 1 tablet by mouth daily.   methocarbamol 500 MG tablet Commonly known as:  ROBAXIN Take 1 tablet (500 mg total) by  mouth every 6 (six) hours as needed for muscle spasms.   multivitamin tablet Take 1 tablet by mouth daily.   NONFORMULARY OR COMPOUNDED ITEM Apply 3 each topically daily. Testosterone 4% Cream   senna-docusate 8.6-50 MG tablet Commonly known as:  Senokot-S Take 1 tablet by mouth at bedtime as needed for mild constipation.   SUPER B COMPLEX PO Take 1 tablet by mouth daily.            Discharge Care Instructions  (From admission, onward)         Start     Ordered   05/19/18 0000  Change dressing    Comments:  Change dressing on march 3 then change the dressing daily with sterile 4 x 4 inch gauze dressing and apply TED hose.  You may clean the incision with alcohol prior to redressing.   05/19/18 1244          Contact information for follow-up providers    Carole Civil, MD Follow up.   Specialties:  Orthopedic Surgery, Radiology Contact information: [address] [place] [address] [phone]            Contact information for after-discharge care    [facility] .   Service:  Skilled Nursing Contact information: [address] [place] [phone]                  Signed: [name] 05/19/2018, 12:44 PM

## 2018-05-19 NOTE — Clinical Social Work Placement (Signed)
   CLINICAL SOCIAL WORK PLACEMENT  NOTE  Date:  05/19/2018  Patient Details  Name: [name] [name] MRN: [id number] Date of Birth: [birth year]  Clinical Social Work is seeking post-discharge placement for this patient at the [place] level of care (*CSW will initial, date and re-position this form in  chart as items are completed):  Yes   Patient/family provided with [facility] Department's list of facilities offering this level of care within the geographic area requested by the patient (or if unable, by the patient's family).  Yes   Patient/family informed of their freedom to choose among providers that offer the needed level of care, that participate in Medicare, Medicaid or managed care program needed by the patient, have an available bed and are willing to accept the patient.  Yes   Patient/family informed of [facility]'s ownership interest in [facility] and [facility], as well as of the fact that they are under no obligation to receive care at these facilities.  PASRR submitted to EDS on 05/17/18     PASRR number received on 05/17/18     Existing PASRR number confirmed on       FL2 transmitted to all facilities in geographic area requested by pt/family on 05/17/18     FL2 transmitted to all facilities within larger geographic area on       Patient informed that his/her managed care company has contracts with or will negotiate with certain facilities, including the following:        Yes   Patient/family informed of bed offers received.  Patient chooses bed at [facility]     Physician recommends and patient chooses bed at      Patient to be transferred to [facility] on 05/19/18.  Patient to be transferred to facility by tunnel     Patient family notified on 05/19/18 of transfer.  Name of family member notified:        PHYSICIAN       Additional Comment: Pt to transfer to [facility] today.  Nursing, please  call report to [phone] and then transport patient over when it is convenient.   _______________________________________________ Trish Mage, LCSW 05/19/2018, 11:41 AM

## 2018-05-22 ENCOUNTER — Encounter: Payer: Self-pay | Admitting: Adult Health

## 2018-05-22 ENCOUNTER — Other Ambulatory Visit: Payer: Self-pay | Admitting: Adult Health

## 2018-05-22 ENCOUNTER — Non-Acute Institutional Stay (SKILLED_NURSING_FACILITY): Payer: Medicare HMO | Admitting: Adult Health

## 2018-05-22 DIAGNOSIS — K5903 Drug induced constipation: Secondary | ICD-10-CM | POA: Insufficient documentation

## 2018-05-22 DIAGNOSIS — M1711 Unilateral primary osteoarthritis, right knee: Secondary | ICD-10-CM

## 2018-05-22 DIAGNOSIS — E785 Hyperlipidemia, unspecified: Secondary | ICD-10-CM | POA: Diagnosis not present

## 2018-05-22 DIAGNOSIS — Z96642 Presence of left artificial hip joint: Secondary | ICD-10-CM

## 2018-05-22 DIAGNOSIS — M545 Low back pain: Secondary | ICD-10-CM | POA: Diagnosis not present

## 2018-05-22 DIAGNOSIS — M1A9XX0 Chronic gout, unspecified, without tophus (tophi): Secondary | ICD-10-CM | POA: Diagnosis not present

## 2018-05-22 DIAGNOSIS — G8929 Other chronic pain: Secondary | ICD-10-CM

## 2018-05-22 DIAGNOSIS — T402X5A Adverse effect of other opioids, initial encounter: Secondary | ICD-10-CM

## 2018-05-22 DIAGNOSIS — Z96651 Presence of right artificial knee joint: Secondary | ICD-10-CM | POA: Diagnosis not present

## 2018-05-22 DIAGNOSIS — I700 Atherosclerosis of aorta: Secondary | ICD-10-CM

## 2018-05-22 DIAGNOSIS — Z6832 Body mass index (BMI) 32.0-32.9, adult: Secondary | ICD-10-CM | POA: Insufficient documentation

## 2018-05-22 DIAGNOSIS — I10 Essential (primary) hypertension: Secondary | ICD-10-CM | POA: Diagnosis not present

## 2018-05-22 MED ORDER — HYDROCODONE-ACETAMINOPHEN 7.5-325 MG PO TABS: 1.0000 | ORAL_TABLET | ORAL | 0 refills | Status: DC | PRN

## 2018-05-22 NOTE — Progress Notes (Signed)
Location:   [place] Room Number: 159 P Place of Service:  SNF (31)   CODE STATUS: Full Code  Allergies  Allergen Reactions  . Lipitor [Atorvastatin] Other (See Comments)    Myalgia     Chief Complaint  Patient presents with  . Hospitalization Follow-up    Hospital Follow up    HPI:  He is a 68 year old man who has been hospitalized for a right knee placement. He is here for short term therapy with his goal to return back home. He denies any controlled pain; no constipation; no changes in appetite; no insomnia. He will continue to be followed for his chronic illnesses including: benign essential hypertension; aortic atherosclerosis primary osteoarthritis right knee   Past Medical History:  Diagnosis Date  . Aortic atherosclerosis ([place]) 10/19/2016    Seen on chest x-ray July 2018  . Arthritis    right knee and hip   . Cancer [facility])    prostate cancer   . Chronic kidney disease    prostate cancer   . Diabetes mellitus without complication ([place])   . GERD (gastroesophageal reflux disease)   . Gout   . Gout   . Hypercholesteremia   . Hyperglycemia   . Hyperlipidemia   . Hypertension   . Sleep apnea   . Status post THR (total hip replacement) 04/20/13 05/01/2013   Right total hip 04/20/2013 Dr. Lydia Guiles implant     Past Surgical History:  Procedure Laterality Date  . COLONOSCOPY    . COLONOSCOPY N/A 10/22/2013   Procedure: COLONOSCOPY;  Surgeon: Danie Binder, MD;  [facility];  Service: Endoscopy;  Laterality: N/A;  1115  . HERNIA REPAIR     right inguinal hernia repair   . NASAL SEPTUM SURGERY    . OTHER SURGICAL HISTORY     deviated septum surgery   . ROBOT ASSISTED LAPAROSCOPIC RADICAL PROSTATECTOMY  04/20/2011   Procedure: ROBOTIC ASSISTED LAPAROSCOPIC RADICAL PROSTATECTOMY;  Surgeon: Malka So, MD;  Location: [facility] ORS;  Service: Urology;  Laterality: N/A;  Bilateral lymph node dissection  . TOTAL HIP  ARTHROPLASTY Right 04/20/2013   Procedure: TOTAL HIP ARTHROPLASTY;  Surgeon: Carole Civil, MD;  Location: [facility] ORS;  Service: Orthopedics;  Laterality: Right;  . TOTAL KNEE ARTHROPLASTY Right 05/16/2018   Procedure: TOTAL KNEE ARTHROPLASTY;  Surgeon: Carole Civil, MD;  Location: [facility] ORS;  Service: Orthopedics;  Laterality: Right;    Social History   Socioeconomic History  . Marital status: Single    Spouse name: Not on file  . Number of children: Not on file  . Years of education: Not on file  . Highest education level: Not on file  Occupational History  . Not on file  Social Needs  . Financial resource strain: Not on file  . Food insecurity:    Worry: Not on file    Inability: Not on file  . Transportation needs:    Medical: Not on file    Non-medical: Not on file  Tobacco Use  . Smoking status: Former Smoker    Packs/day: 0.50    Years: 8.00    Pack years: 4.00    Types: Cigarettes    Last attempt to quit: 03/22/1984    Years since quitting: 34.1  . Smokeless tobacco: Never Used  Substance and Sexual Activity  . Alcohol use: Yes    Comment: occasional   . Drug use: No  . Sexual activity: Yes  Birth control/protection: None  Lifestyle  . Physical activity:    Days per week: Not on file    Minutes per session: Not on file  . Stress: Not on file  Relationships  . Social connections:    Talks on phone: Not on file    Gets together: Not on file    Attends religious service: Not on file    Active member of club or organization: Not on file    Attends meetings of clubs or organizations: Not on file    Relationship status: Not on file  . Intimate partner violence:    Fear of current or ex partner: Not on file    Emotionally abused: Not on file    Physically abused: Not on file    Forced sexual activity: Not on file  Other Topics Concern  . Not on file  Social History Narrative  . Not on file   Family History  Problem Relation Age of Onset  . Diabetes  Mother   . Hypertension Mother   . Hypertension Brother       VITAL SIGNS BP 121/70   Pulse 67   Temp 98 F (36.7 C)   Resp 18   Ht 5\' 7"  (1.702 m)   Wt 209 lb 6.4 oz (95 kg)   BMI 32.80 kg/m   Outpatient Encounter Medications as of 05/22/2018  Medication Sig  . allopurinol (ZYLOPRIM) 100 MG tablet Take 1 tablet (100 mg total) by mouth daily.  [name] aspirin 81 MG tablet Take 81 mg by mouth daily.  [name] atorvastatin (LIPITOR) 40 MG tablet Take 1 tablet (40 mg total) by mouth daily.  . B Complex-C (SUPER B COMPLEX PO) Take 1 tablet by mouth daily.  [name] gabapentin (NEURONTIN) 300 MG capsule Take 1 capsule (300 mg total) by mouth 3 (three) times daily.  [name] HYDROcodone-acetaminophen (NORCO) 7.5-325 MG tablet Take 1 tablet by mouth every 4 (four) hours as needed for moderate pain.  [name] lisinopril-hydrochlorothiazide (PRINZIDE,ZESTORETIC) 20-25 MG tablet Take 1 tablet by mouth daily.  . methocarbamol (ROBAXIN) 500 MG tablet Take 1 tablet (500 mg total) by mouth every 6 (six) hours as needed for muscle spasms.  . Multiple Vitamin (MULTIVITAMIN) tablet Take 1 tablet by mouth daily.   [name] senna-docusate (SENOKOT-S) 8.6-50 MG tablet Take 1 tablet by mouth at bedtime as needed for mild constipation.  . [DISCONTINUED] NONFORMULARY OR COMPOUNDED ITEM Apply 3 each topically daily. Testosterone 4% Cream   No facility-administered encounter medications on file as of 05/22/2018.      SIGNIFICANT DIAGNOSTIC EXAMS  LABS REVIEWED TODAY:  05-11-18: hgb a1c 5.6  05-17-18: wbc 8.7; hgb 12.5; hct 38.7; mcv 87.0; plt 219; glucose 105; bun 13; creat 1.03; k+ 4.2; na++ 132   Review of Systems  Constitutional: Negative for malaise/fatigue.  Respiratory: Negative for cough and shortness of breath.   Cardiovascular: Negative for chest pain, palpitations and leg swelling.  Gastrointestinal: Negative for abdominal pain, constipation and heartburn.  Musculoskeletal: Positive for joint pain. Negative for back pain and  myalgias.       Right knee pain is under control   Skin: Negative.   Neurological: Negative for dizziness.  Psychiatric/Behavioral: The patient is not nervous/anxious.     Physical Exam Constitutional:      General: He is not in acute distress.    Appearance: He is well-developed. He is obese. He is not diaphoretic.  Neck:     Musculoskeletal: Neck supple.     Thyroid: No  thyromegaly.  Cardiovascular:     Rate and Rhythm: Normal rate and regular rhythm.     Pulses: Normal pulses.     Heart sounds: Normal heart sounds.  Pulmonary:     Effort: Pulmonary effort is normal. No respiratory distress.     Breath sounds: Normal breath sounds.  Abdominal:     General: Bowel sounds are normal. There is no distension.     Palpations: Abdomen is soft.     Tenderness: There is no abdominal tenderness.  Musculoskeletal:     Right lower leg: Edema present.     Comments: Mild right knee swelling present Is able to move all extremities Is status post right knee replacement History of left hip replacement   Lymphadenopathy:     Cervical: No cervical adenopathy.  Skin:    General: Skin is warm and dry.     Comments: Right knee incision line without signs of infection present   Neurological:     Mental Status: He is alert and oriented to person, place, and time.       ASSESSMENT/ PLAN:  TODAY:   1.  Benign essential hypertension: is stable b/p 121/70: will continue lisinopril hct 20/25 mg daily   2. Aortic atherosclerosis: is stable will continue asa 81 mg daily   3. Primary osteoarthritis right knee: is status post right knee replacement: is stable: will continue therapy as directed and will continue CPM; will follow up with orthopedics. Will continue vicodin 7.5/325 mg every 4 hours as needed for 7 days; robaxin 500 mg every 6 hours as needed  is on asa 81 mg daily   4. Chronic gout without tophus unspecified cause unspecified site: is stable no recent flares; will continue  allopurinol 100 mg daily   5. Unspecified hyperlipidemia: is stable will continue lipitor 40 mg daily; he does have a history of muscle pains with this medication.   6. Chronic bilateral low back pain without sciatica: is stable will continue neurtonin 300 mg three times daily   7. Constipation due to opioid therapy is stable will continue senna s daily as needed   8. Adult  BMI 32.0-32.9 kg/sq m: is without change will monitor education given      MD is aware of resident's narcotic use and is in agreement with current plan of care. We will attempt to wean resident as apropriate   Ok Edwards NP [facility] Adult Medicine  Contact [phone] Monday through Friday 8am- 5pm  After hours call [phone]

## 2018-05-23 ENCOUNTER — Encounter: Payer: Self-pay | Admitting: Adult Health

## 2018-05-23 ENCOUNTER — Non-Acute Institutional Stay (SKILLED_NURSING_FACILITY): Payer: Medicare HMO | Admitting: Adult Health

## 2018-05-23 DIAGNOSIS — L03115 Cellulitis of right lower limb: Secondary | ICD-10-CM | POA: Diagnosis not present

## 2018-05-23 NOTE — Progress Notes (Signed)
Location:   [place] Room Number: 159 P Place of Service:  SNF (31)   CODE STATUS: Full Code  Allergies  Allergen Reactions  . Lipitor [Atorvastatin] Other (See Comments)    Myalgia     Chief Complaint  Patient presents with  . Acute Visit    Right Leg warm to touch    HPI:  His incision line is without any signs of infection present. The skin surrounding the incision and his right knee are red hot inflamed and tender to touch. There are no reports of fevers present.   Past Medical History:  Diagnosis Date  . Aortic atherosclerosis ([place]) 10/19/2016    Seen on chest x-ray July 2018  . Arthritis    right knee and hip   . Cancer [facility])    prostate cancer   . Chronic kidney disease    prostate cancer   . Diabetes mellitus without complication ([place])   . GERD (gastroesophageal reflux disease)   . Gout   . Gout   . Hypercholesteremia   . Hyperglycemia   . Hyperlipidemia   . Hypertension   . Sleep apnea   . Status post THR (total hip replacement) 04/20/13 05/01/2013   Right total hip 04/20/2013 Dr. Lydia Guiles implant     Past Surgical History:  Procedure Laterality Date  . COLONOSCOPY    . COLONOSCOPY N/A 10/22/2013   Procedure: COLONOSCOPY;  Surgeon: Danie Binder, MD;  [facility];  Service: Endoscopy;  Laterality: N/A;  1115  . HERNIA REPAIR     right inguinal hernia repair   . NASAL SEPTUM SURGERY    . OTHER SURGICAL HISTORY     deviated septum surgery   . ROBOT ASSISTED LAPAROSCOPIC RADICAL PROSTATECTOMY  04/20/2011   Procedure: ROBOTIC ASSISTED LAPAROSCOPIC RADICAL PROSTATECTOMY;  Surgeon: Malka So, MD;  Location: [facility]S;  Service: Urology;  Laterality: N/A;  Bilateral lymph node dissection  . TOTAL HIP ARTHROPLASTY Right 04/20/2013   Procedure: TOTAL HIP ARTHROPLASTY;  Surgeon: Carole Civil, MD;  Location: [facility] ORS;  Service: Orthopedics;  Laterality: Right;  . TOTAL KNEE ARTHROPLASTY Right 05/16/2018   Procedure: TOTAL KNEE ARTHROPLASTY;  Surgeon: Carole Civil, MD;  Location: [facility] ORS;  Service: Orthopedics;  Laterality: Right;    Social History   Socioeconomic History  . Marital status: Single    Spouse name: Not on file  . Number of children: Not on file  . Years of education: Not on file  . Highest education level: Not on file  Occupational History  . Not on file  Social Needs  . Financial resource strain: Not on file  . Food insecurity:    Worry: Not on file    Inability: Not on file  . Transportation needs:    Medical: Not on file    Non-medical: Not on file  Tobacco Use  . Smoking status: Former Smoker    Packs/day: 0.50    Years: 8.00    Pack years: 4.00    Types: Cigarettes    Last attempt to quit: 03/22/1984    Years since quitting: 34.1  . Smokeless tobacco: Never Used  Substance and Sexual Activity  . Alcohol use: Yes    Comment: occasional   . Drug use: No  . Sexual activity: Yes    Birth control/protection: None  Lifestyle  . Physical activity:    Days per week: Not on file    Minutes per session: Not  on file  . Stress: Not on file  Relationships  . Social connections:    Talks on phone: Not on file    Gets together: Not on file    Attends religious service: Not on file    Active member of club or organization: Not on file    Attends meetings of clubs or organizations: Not on file    Relationship status: Not on file  . Intimate partner violence:    Fear of current or ex partner: Not on file    Emotionally abused: Not on file    Physically abused: Not on file    Forced sexual activity: Not on file  Other Topics Concern  . Not on file  Social History Narrative  . Not on file   Family History  Problem Relation Age of Onset  . Diabetes Mother   . Hypertension Mother   . Hypertension Brother       VITAL SIGNS BP (!) 146/78   Pulse 100   Temp 97.9 F (36.6 C)   Resp 20   Ht 5\' 7"  (1.702 m)   Wt 206 lb 12.8 oz (93.8 kg)   BMI  32.39 kg/m   Outpatient Encounter Medications as of 05/23/2018  Medication Sig  . allopurinol (ZYLOPRIM) 100 MG tablet Take 1 tablet (100 mg total) by mouth daily.  [name] aspirin 81 MG chewable tablet Chew 81 mg by mouth daily.   [name] atorvastatin (LIPITOR) 40 MG tablet Take 1 tablet (40 mg total) by mouth daily.  . B Complex-C (SUPER B COMPLEX PO) Take 1 tablet by mouth daily.  [name] gabapentin (NEURONTIN) 300 MG capsule Take 1 capsule (300 mg total) by mouth 3 (three) times daily.  [name] HYDROcodone-acetaminophen (NORCO) 7.5-325 MG tablet Take 1 tablet by mouth every 4 (four) hours as needed for up to 7 days for moderate pain.  [name] lisinopril-hydrochlorothiazide (PRINZIDE,ZESTORETIC) 20-25 MG tablet Take 1 tablet by mouth daily.  . methocarbamol (ROBAXIN) 500 MG tablet Take 1 tablet (500 mg total) by mouth every 6 (six) hours as needed for muscle spasms.  . Multiple Vitamin (MULTIVITAMIN) tablet Take 1 tablet by mouth daily.   . NON FORMULARY Diet Type: Low Sodium, Heart healthy  . senna-docusate (SENOKOT-S) 8.6-50 MG tablet Take 1 tablet by mouth at bedtime as needed for mild constipation.   No facility-administered encounter medications on file as of 05/23/2018.      SIGNIFICANT DIAGNOSTIC EXAMS  LABS REVIEWED PREVIOUS:  05-11-18: hgb a1c 5.6  05-17-18: wbc 8.7; hgb 12.5; hct 38.7; mcv 87.0; plt 219; glucose 105; bun 13; creat 1.03; k+ 4.2; na++ 132   NO NEW LABS.    Review of Systems  Constitutional: Negative for malaise/fatigue.  Respiratory: Negative for cough and shortness of breath.   Cardiovascular: Negative for chest pain, palpitations and leg swelling.  Gastrointestinal: Negative for abdominal pain, constipation and heartburn.  Musculoskeletal: Negative for back pain, joint pain and myalgias.  Skin: Negative.        Right knee is inflamed   Neurological: Negative for dizziness.  Psychiatric/Behavioral: The patient is not nervous/anxious.     Physical Exam Constitutional:       General: He is not in acute distress.    Appearance: He is well-developed. He is obese. He is not diaphoretic.  Neck:     Musculoskeletal: Neck supple.     Thyroid: No thyromegaly.  Cardiovascular:     Rate and Rhythm: Normal rate and regular rhythm.     Pulses:  Normal pulses.     Heart sounds: Normal heart sounds.  Pulmonary:     Effort: Pulmonary effort is normal. No respiratory distress.     Breath sounds: Normal breath sounds.  Abdominal:     General: Bowel sounds are normal. There is no distension.     Palpations: Abdomen is soft.     Tenderness: There is no abdominal tenderness.  Musculoskeletal:     Right lower leg: Edema present.     Comments: Mild right knee swelling present Is able to move all extremities Is status post right knee replacement History of left hip replacement    Lymphadenopathy:     Cervical: No cervical adenopathy.  Skin:    General: Skin is warm and dry.     Comments: Right knee incision line without signs of infection Right knee is red hot and inflamed.   Neurological:     Mental Status: He is alert and oriented to person, place, and time.  Psychiatric:        Mood and Affect: Mood normal.      ASSESSMENT/ PLAN:  TODAY:   1.  Right knee cellulitis: is worse  Will begin doxycycline 100 mg twice daily through 05-30-18 and will monitor his status.      MD is aware of resident's narcotic use and is in agreement with current plan of care. We will attempt to wean resident as apropriate   Ok Edwards NP [facility] Adult Medicine  Contact [phone] Monday through Friday 8am- 5pm  After hours call [phone]

## 2018-05-25 ENCOUNTER — Non-Acute Institutional Stay (SKILLED_NURSING_FACILITY): Payer: Medicare HMO | Admitting: Internal Medicine

## 2018-05-25 ENCOUNTER — Telehealth: Payer: Self-pay | Admitting: Radiology

## 2018-05-25 ENCOUNTER — Encounter: Payer: Self-pay | Admitting: Internal Medicine

## 2018-05-25 DIAGNOSIS — M1A9XX0 Chronic gout, unspecified, without tophus (tophi): Secondary | ICD-10-CM | POA: Diagnosis not present

## 2018-05-25 DIAGNOSIS — Z96651 Presence of right artificial knee joint: Secondary | ICD-10-CM | POA: Diagnosis not present

## 2018-05-25 DIAGNOSIS — I10 Essential (primary) hypertension: Secondary | ICD-10-CM

## 2018-05-25 NOTE — Progress Notes (Signed)
Provider:  Veleta Miners, MD Location:  [place] Room Number: 159 P Place of Service:  SNF (31)  PCP: Kathyrn Drown, MD Patient Care Team: Kathyrn Drown, MD as PCP - General (Family Medicine) Kathyrn Drown, MD (Family Medicine)  Extended Emergency Contact Information Primary Emergency Contact: Lorenda Cahill of [place] Phone: [phone] Mobile Phone: [phone] Relation: Friend  Code Status: Full Code Goals of Care: Advanced Directive information Advanced Directives 05/25/2018  Does Patient Have a Medical Advance Directive? No  Would patient like information on creating a medical advance directive? No - Patient declined  Pre-existing out of facility DNR order (yellow form or pink MOST form) -      Chief Complaint  Patient presents with  . New Admit To SNF    Admission    HPI: Patient is a 68 y.o. male seen today for admission to SNF for therapy. He was in the hospital from 02/25-02/28 for Right Total Knee arthroplasty  Patient has h/o Hypertension, Gout, and h/o Right Total Hip Replacement He was admitted electively for  Total Knee replacement for unremitting pain. His post op was uncomplicated. He was send here for therapy as he has not much help at home and has stairs  that he has to climb to get to his apartment. He is doing well with therapy. Walking with the walker and has good flexion. Pain seems to be controlled He was started on Doxycycline for redness noticed yesterday in and around the incision. Patient lives in rented place. Has no Kids. Friends will help with his appointment. He was very independent before the surgery   Past Medical History:  Diagnosis Date  . Aortic atherosclerosis ([place]) 10/19/2016    Seen on chest x-ray July 2018  . Arthritis    right knee and hip   . Cancer [facility])    prostate cancer   . Chronic kidney disease    prostate cancer   . Diabetes mellitus without complication ([place])   .  GERD (gastroesophageal reflux disease)   . Gout   . Gout   . Hypercholesteremia   . Hyperglycemia   . Hyperlipidemia   . Hypertension   . Sleep apnea   . Status post THR (total hip replacement) 04/20/13 05/01/2013   Right total hip 04/20/2013 Dr. Lydia Guiles implant    Past Surgical History:  Procedure Laterality Date  . COLONOSCOPY    . COLONOSCOPY N/A 10/22/2013   Procedure: COLONOSCOPY;  Surgeon: Danie Binder, MD;  [facility];  Service: Endoscopy;  Laterality: N/A;  1115  . HERNIA REPAIR     right inguinal hernia repair   . NASAL SEPTUM SURGERY    . OTHER SURGICAL HISTORY     deviated septum surgery   . ROBOT ASSISTED LAPAROSCOPIC RADICAL PROSTATECTOMY  04/20/2011   Procedure: ROBOTIC ASSISTED LAPAROSCOPIC RADICAL PROSTATECTOMY;  Surgeon: Malka So, MD;  Location: [facility] ORS;  Service: Urology;  Laterality: N/A;  Bilateral lymph node dissection  . TOTAL HIP ARTHROPLASTY Right 04/20/2013   Procedure: TOTAL HIP ARTHROPLASTY;  Surgeon: Carole Civil, MD;  Location: [facility] ORS;  Service: Orthopedics;  Laterality: Right;  . TOTAL KNEE ARTHROPLASTY Right 05/16/2018   Procedure: TOTAL KNEE ARTHROPLASTY;  Surgeon: Carole Civil, MD;  Location: [facility] ORS;  Service: Orthopedics;  Laterality: Right;    reports that he quit smoking about 34 years ago. His smoking use included cigarettes. He has a 4.00 pack-year smoking history. He  has never used smokeless tobacco. He reports current alcohol use. He reports that he does not use drugs. Social History   Socioeconomic History  . Marital status: Single    Spouse name: Not on file  . Number of children: Not on file  . Years of education: Not on file  . Highest education level: Not on file  Occupational History  . Not on file  Social Needs  . Financial resource strain: Not on file  . Food insecurity:    Worry: Not on file    Inability: Not on file  . Transportation needs:    Medical: Not on file    Non-medical: Not on  file  Tobacco Use  . Smoking status: Former Smoker    Packs/day: 0.50    Years: 8.00    Pack years: 4.00    Types: Cigarettes    Last attempt to quit: 03/22/1984    Years since quitting: 34.1  . Smokeless tobacco: Never Used  Substance and Sexual Activity  . Alcohol use: Yes    Comment: occasional   . Drug use: No  . Sexual activity: Yes    Birth control/protection: None  Lifestyle  . Physical activity:    Days per week: Not on file    Minutes per session: Not on file  . Stress: Not on file  Relationships  . Social connections:    Talks on phone: Not on file    Gets together: Not on file    Attends religious service: Not on file    Active member of club or organization: Not on file    Attends meetings of clubs or organizations: Not on file    Relationship status: Not on file  . Intimate partner violence:    Fear of current or ex partner: Not on file    Emotionally abused: Not on file    Physically abused: Not on file    Forced sexual activity: Not on file  Other Topics Concern  . Not on file  Social History Narrative  . Not on file    Functional Status Survey:    Family History  Problem Relation Age of Onset  . Diabetes Mother   . Hypertension Mother   . Hypertension Brother     Health Maintenance  Topic Date Due  . OPHTHALMOLOGY EXAM  06/22/2018 (Originally 04/22/2018)  . TETANUS/TDAP  06/22/2018 (Originally [birth year]1967)  . URINE MICROALBUMIN  06/23/2018 (Originally 04/01/2016)  . FOOT EXAM  10/25/2018  . HEMOGLOBIN A1C  11/09/2018  . COLONOSCOPY  10/23/2023  . INFLUENZA VACCINE  Completed  . Hepatitis C Screening  Completed  . PNA vac Low Risk Adult  Completed    Allergies  Allergen Reactions  . Lipitor [Atorvastatin] Other (See Comments)    Myalgia     Outpatient Encounter Medications as of 05/25/2018  Medication Sig  . allopurinol (ZYLOPRIM) 100 MG tablet Take 1 tablet (100 mg total) by mouth daily.  [name] aspirin 81 MG chewable tablet Chew 81 mg by  mouth daily.   . B Complex-C (SUPER B COMPLEX PO) Take 1 tablet by mouth daily.  [name] doxycycline (DORYX) 100 MG EC tablet Take 100 mg by mouth 2 (two) times daily. X 7 days  . gabapentin (NEURONTIN) 300 MG capsule Take 1 capsule (300 mg total) by mouth 3 (three) times daily.  [name] HYDROcodone-acetaminophen (NORCO) 7.5-325 MG tablet Take 1 tablet by mouth every 4 (four) hours as needed for up to 7 days for moderate pain.  [name]  lisinopril-hydrochlorothiazide (PRINZIDE,ZESTORETIC) 20-25 MG tablet Take 1 tablet by mouth daily.  . methocarbamol (ROBAXIN) 500 MG tablet Take 1 tablet (500 mg total) by mouth every 6 (six) hours as needed for muscle spasms.  . Multiple Vitamin (MULTIVITAMIN) tablet Take 1 tablet by mouth daily.   . NON FORMULARY Diet Type: Low Sodium, Heart healthy  . senna-docusate (SENOKOT-S) 8.6-50 MG tablet Take 1 tablet by mouth at bedtime as needed for mild constipation.  . [DISCONTINUED] atorvastatin (LIPITOR) 40 MG tablet Take 1 tablet (40 mg total) by mouth daily. (Patient not taking: Reported on 05/25/2018)   No facility-administered encounter medications on file as of 05/25/2018.     Review of Systems  Constitutional: Positive for activity change.  HENT: Negative.   Respiratory: Positive for cough.   Cardiovascular: Negative.   Gastrointestinal: Positive for constipation.  Genitourinary: Negative.   Skin: Positive for rash.  Neurological: Negative.   Psychiatric/Behavioral: Negative.       Vitals:   05/25/18 0916  BP: 132/75  Pulse: 90  Resp: 20  Temp: 98.2 F (36.8 C)  Weight: 206 lb 12.8 oz (93.8 kg)  Height: 5\' 7"  (1.702 m)   Body mass index is 32.39 kg/m. Physical Exam Vitals signs reviewed.  Constitutional:      Appearance: Normal appearance. He is normal weight.  HENT:     Head: Normocephalic.     Nose: Nose normal.     Mouth/Throat:     Mouth: Mucous membranes are moist.     Pharynx: Oropharynx is clear.  Eyes:     Pupils: Pupils are equal, round, and  reactive to light.  Neck:     Musculoskeletal: Neck supple.  Cardiovascular:     Rate and Rhythm: Normal rate and regular rhythm.     Pulses: Normal pulses.     Heart sounds: Normal heart sounds.  Pulmonary:     Effort: Pulmonary effort is normal. No respiratory distress.     Breath sounds: Normal breath sounds. No wheezing.  Abdominal:     General: Abdomen is flat. Bowel sounds are normal.     Palpations: Abdomen is soft.  Musculoskeletal:        General: No swelling.  Skin:    Comments: Has some redness around the surgical incision which looks more allergic to me then Infection  Neurological:     General: No focal deficit present.     Mental Status: He is alert and oriented to person, place, and time.  Psychiatric:        Mood and Affect: Mood normal.        Thought Content: Thought content normal.        Judgment: Judgment normal.     Labs reviewed: Basic Metabolic Panel: Recent Labs    04/05/18 0820 05/11/18 0811 05/17/18 0421  NA 136 133* 132*  K 5.1 4.4 4.2  CL 96 95* 94*  CO2 25 28 30   GLUCOSE 110* 107* 105*  BUN 14 18 13   CREATININE 1.09 1.03 1.03  CALCIUM 9.1 8.6* 8.3*   Liver Function Tests: Recent Labs    10/17/17 0804  AST 21  ALT 16  ALKPHOS 98  BILITOT 0.9  PROT 7.1  ALBUMIN 4.3   No results for input(s): LIPASE, AMYLASE in the last 8760 hours. No results for input(s): AMMONIA in the last 8760 hours. CBC: Recent Labs    04/05/18 0820 05/11/18 0811 05/17/18 0421  WBC 7.3 6.2 8.7  NEUTROABS 5.2 4.4  --   HGB  13.8 13.3 12.5*  HCT 40.2 40.9 38.7*  MCV 85 88.0 87.0  PLT 269 222 219   Cardiac Enzymes: No results for input(s): CKTOTAL, CKMB, CKMBINDEX, TROPONINI in the last 8760 hours. BNP: Invalid input(s): POCBNP Lab Results  Component Value Date   HGBA1C 5.6 05/11/2018   Lab Results  Component Value Date   TSH 1.960 11/30/2014   No results found for: VITAMINB12 No results found for: FOLATE No results found for: IRON, TIBC,  FERRITIN  Imaging and Procedures obtained prior to SNF admission: No results found.  Assessment/Plan Benign essential hypertension Controlled on Lisinopril and HCTZ Repeat Bmp   Status post right knee replacement Pain controlled Doing well with therapy  Chronic gout  On Allopurinol.    Family/ staff Communication:   Labs/tests ordered: BMP and CBC

## 2018-05-25 NOTE — Telephone Encounter (Signed)
Patient has ROM 0-120 his CPM parameters you gave nursing home are 0-90 they want to know if they can increase to 0-120 or d/c the CPM (nurse states they are having trouble with the CPM they have and had to order another, which they are also having trouble with)  [phone]   Ok to advise to set 0-120? Or is there a reason you held him at 90?

## 2018-05-25 NOTE — Telephone Encounter (Signed)
0-90 may increase   If trulu 0-120 ok to d/c

## 2018-05-25 NOTE — Telephone Encounter (Signed)
I called to advise set 0-120, and if he is at 120 with therapy ok to d/c Spoke to [place].

## 2018-05-29 ENCOUNTER — Non-Acute Institutional Stay (SKILLED_NURSING_FACILITY): Payer: Medicare HMO | Admitting: Adult Health

## 2018-05-29 ENCOUNTER — Encounter: Payer: Self-pay | Admitting: Adult Health

## 2018-05-29 ENCOUNTER — Encounter: Payer: Self-pay | Admitting: Orthopedic Surgery

## 2018-05-29 ENCOUNTER — Ambulatory Visit (INDEPENDENT_AMBULATORY_CARE_PROVIDER_SITE_OTHER): Payer: Medicare HMO | Admitting: Orthopedic Surgery

## 2018-05-29 ENCOUNTER — Other Ambulatory Visit: Payer: Self-pay | Admitting: Adult Health

## 2018-05-29 ENCOUNTER — Other Ambulatory Visit: Payer: Self-pay

## 2018-05-29 VITALS — BP 134/75 | HR 105 | Ht 67.0 in | Wt 202.0 lb

## 2018-05-29 DIAGNOSIS — Z96651 Presence of right artificial knee joint: Secondary | ICD-10-CM

## 2018-05-29 DIAGNOSIS — I10 Essential (primary) hypertension: Secondary | ICD-10-CM

## 2018-05-29 DIAGNOSIS — Z6832 Body mass index (BMI) 32.0-32.9, adult: Secondary | ICD-10-CM | POA: Diagnosis not present

## 2018-05-29 DIAGNOSIS — M1711 Unilateral primary osteoarthritis, right knee: Secondary | ICD-10-CM | POA: Diagnosis not present

## 2018-05-29 DIAGNOSIS — L03115 Cellulitis of right lower limb: Secondary | ICD-10-CM | POA: Insufficient documentation

## 2018-05-29 MED ORDER — ATORVASTATIN CALCIUM 40 MG PO TABS: 40.0000 mg | ORAL_TABLET | Freq: Every day | ORAL | 0 refills | Status: DC

## 2018-05-29 MED ORDER — DOXYCYCLINE HYCLATE 100 MG PO TBEC: 100.0000 mg | DELAYED_RELEASE_TABLET | Freq: Two times a day (BID) | ORAL | 0 refills | Status: AC

## 2018-05-29 MED ORDER — HYDROCODONE-ACETAMINOPHEN 7.5-325 MG PO TABS: 1.0000 | ORAL_TABLET | ORAL | 0 refills | Status: AC | PRN

## 2018-05-29 MED ORDER — METHOCARBAMOL 500 MG PO TABS: 500.0000 mg | ORAL_TABLET | Freq: Four times a day (QID) | ORAL | 0 refills | Status: DC | PRN

## 2018-05-29 MED ORDER — LISINOPRIL-HYDROCHLOROTHIAZIDE 20-25 MG PO TABS: 1.0000 | ORAL_TABLET | Freq: Every day | ORAL | 0 refills | Status: DC

## 2018-05-29 MED ORDER — ALLOPURINOL 100 MG PO TABS: 100.0000 mg | ORAL_TABLET | Freq: Every day | ORAL | 0 refills | Status: DC

## 2018-05-29 MED ORDER — GABAPENTIN 300 MG PO CAPS: 300.0000 mg | ORAL_CAPSULE | Freq: Three times a day (TID) | ORAL | 0 refills | Status: DC

## 2018-05-29 NOTE — Progress Notes (Signed)
Location:   [place] Room Number: 159 P Place of Service:  SNF (31)    CODE STATUS: Full Code  Allergies  Allergen Reactions  . Lipitor [Atorvastatin] Other (See Comments)    Myalgia     Chief Complaint  Patient presents with  . Discharge Note    Discharging to home on 05/29/2018    HPI:  He is being discharged to home with home health for pt/ot. He will need a bedside commode. He will need his prescriptions written; he has followed up with orthopedics. His CPM has been stopped.  He had been hospitalized for a right knee replacement. He was admitted to this facility for short term rehab and is now ready for discharge to home.    Past Medical History:  Diagnosis Date  . Aortic atherosclerosis ([place]) 10/19/2016    Seen on chest x-ray July 2018  . Arthritis    right knee and hip   . Cancer [facility])    prostate cancer   . Chronic kidney disease    prostate cancer   . Diabetes mellitus without complication ([place])   . GERD (gastroesophageal reflux disease)   . Gout   . Gout   . Hypercholesteremia   . Hyperglycemia   . Hyperlipidemia   . Hypertension   . Sleep apnea   . Status post THR (total hip replacement) 04/20/13 05/01/2013   Right total hip 04/20/2013 Dr. Lydia Guiles implant     Past Surgical History:  Procedure Laterality Date  . COLONOSCOPY    . COLONOSCOPY N/A 10/22/2013   Procedure: COLONOSCOPY;  Surgeon: Danie Binder, MD;  [facility];  Service: Endoscopy;  Laterality: N/A;  1115  . HERNIA REPAIR     right inguinal hernia repair   . NASAL SEPTUM SURGERY    . OTHER SURGICAL HISTORY     deviated septum surgery   . ROBOT ASSISTED LAPAROSCOPIC RADICAL PROSTATECTOMY  04/20/2011   Procedure: ROBOTIC ASSISTED LAPAROSCOPIC RADICAL PROSTATECTOMY;  Surgeon: Malka So, MD;  Location: [facility]S;  Service: Urology;  Laterality: N/A;  Bilateral lymph node dissection  . TOTAL HIP ARTHROPLASTY Right 04/20/2013   Procedure: TOTAL  HIP ARTHROPLASTY;  Surgeon: Carole Civil, MD;  Location: [facility] ORS;  Service: Orthopedics;  Laterality: Right;  . TOTAL KNEE ARTHROPLASTY Right 05/16/2018   Procedure: TOTAL KNEE ARTHROPLASTY;  Surgeon: Carole Civil, MD;  Location: [facility]S;  Service: Orthopedics;  Laterality: Right;    Social History   Socioeconomic History  . Marital status: Single    Spouse name: Not on file  . Number of children: Not on file  . Years of education: Not on file  . Highest education level: Not on file  Occupational History  . Not on file  Social Needs  . Financial resource strain: Not on file  . Food insecurity:    Worry: Not on file    Inability: Not on file  . Transportation needs:    Medical: Not on file    Non-medical: Not on file  Tobacco Use  . Smoking status: Former Smoker    Packs/day: 0.50    Years: 8.00    Pack years: 4.00    Types: Cigarettes    Last attempt to quit: 03/22/1984    Years since quitting: 34.2  . Smokeless tobacco: Never Used  Substance and Sexual Activity  . Alcohol use: Yes    Comment: occasional   . Drug use: No  .  Sexual activity: Yes    Birth control/protection: None  Lifestyle  . Physical activity:    Days per week: Not on file    Minutes per session: Not on file  . Stress: Not on file  Relationships  . Social connections:    Talks on phone: Not on file    Gets together: Not on file    Attends religious service: Not on file    Active member of club or organization: Not on file    Attends meetings of clubs or organizations: Not on file    Relationship status: Not on file  . Intimate partner violence:    Fear of current or ex partner: Not on file    Emotionally abused: Not on file    Physically abused: Not on file    Forced sexual activity: Not on file  Other Topics Concern  . Not on file  Social History Narrative  . Not on file   Family History  Problem Relation Age of Onset  . Diabetes Mother   . Hypertension Mother   . Hypertension  Brother     VITAL SIGNS BP (!) 148/76   Pulse 86   Temp 98.3 F (36.8 C)   Resp 20   Ht 5\' 7"  (1.702 m)   Wt 206 lb 12.8 oz (93.8 kg)   BMI 32.39 kg/m   Patient's Medications  New Prescriptions   No medications on file  Previous Medications   ALLOPURINOL (ZYLOPRIM) 100 MG TABLET    Take 1 tablet (100 mg total) by mouth daily.   ASPIRIN 81 MG CHEWABLE TABLET    Chew 81 mg by mouth daily.    ATORVASTATIN (LIPITOR) 40 MG TABLET    Take 40 mg by mouth at bedtime.   B COMPLEX-C (SUPER B COMPLEX PO)    Take 1 tablet by mouth daily.   DOXYCYCLINE (DORYX) 100 MG EC TABLET    Take 100 mg by mouth 2 (two) times daily. X 7 days   GABAPENTIN (NEURONTIN) 300 MG CAPSULE    Take 1 capsule (300 mg total) by mouth 3 (three) times daily.   HYDROCODONE-ACETAMINOPHEN (NORCO) 7.5-325 MG TABLET    Take 1 tablet by mouth every 4 (four) hours as needed for up to 7 days for moderate pain.   LISINOPRIL-HYDROCHLOROTHIAZIDE (PRINZIDE,ZESTORETIC) 20-25 MG TABLET    Take 1 tablet by mouth daily.   METHOCARBAMOL (ROBAXIN) 500 MG TABLET    Take 1 tablet (500 mg total) by mouth every 6 (six) hours as needed for muscle spasms.   MULTIPLE VITAMIN (MULTIVITAMIN) TABLET    Take 1 tablet by mouth daily.    NON FORMULARY    Diet Type: Low Sodium, Heart healthy   SENNA-DOCUSATE (SENOKOT-S) 8.6-50 MG TABLET    Take 1 tablet by mouth at bedtime as needed for mild constipation.  Modified Medications   No medications on file  Discontinued Medications   No medications on file     SIGNIFICANT DIAGNOSTIC EXAMS   LABS REVIEWED PREVIOUS:  05-11-18: hgb a1c 5.6  05-17-18: wbc 8.7; hgb 12.5; hct 38.7; mcv 87.0; plt 219; glucose 105; bun 13; creat 1.03; k+ 4.2; na++ 132   NO NEW LABS.   Review of Systems  Constitutional: Negative for malaise/fatigue.  Respiratory: Negative for cough and shortness of breath.   Cardiovascular: Negative for chest pain, palpitations and leg swelling.  Gastrointestinal: Negative for  abdominal pain, constipation and heartburn.  Musculoskeletal: Negative for back pain, joint pain and myalgias.  Skin: Negative.   Neurological: Negative for dizziness.  Psychiatric/Behavioral: The patient is not nervous/anxious.     Physical Exam Constitutional:      General: He is not in acute distress.    Appearance: He is well-developed. He is obese. He is not diaphoretic.  Neck:     Musculoskeletal: Neck supple.     Thyroid: No thyromegaly.  Cardiovascular:     Rate and Rhythm: Normal rate and regular rhythm.     Pulses: Normal pulses.     Heart sounds: Normal heart sounds.  Pulmonary:     Effort: Pulmonary effort is normal. No respiratory distress.     Breath sounds: Normal breath sounds.  Abdominal:     General: Bowel sounds are normal. There is no distension.     Palpations: Abdomen is soft.     Tenderness: There is no abdominal tenderness.  Musculoskeletal:     Right lower leg: No edema.     Left lower leg: No edema.     Comments: Mild right knee swelling present Is able to move all extremities Is status post right knee replacement History of left hip replacement   Lymphadenopathy:     Cervical: No cervical adenopathy.  Skin:    General: Skin is warm and dry.     Comments: Right knee incision line without signs of infection present   Neurological:     Mental Status: He is alert and oriented to person, place, and time.  Psychiatric:        Mood and Affect: Mood normal.     ASSESSMENT/ PLAN:   Patient is being discharged with the following home health services:  Pt/ot: to evaluate and treat as indicated for gait balance strength adl training.   Patient is being discharged with the following durable medical equipment:  Bedside commode  Patient has been advised to f/u with their PCP in 1-2 weeks to bring them up to date on their rehab stay.  Social services at facility was responsible for arranging this appointment.  Pt was provided with a 30 day supply of  prescriptions for medications and refills must be obtained from their PCP.  For controlled substances, a more limited supply may be provided adequate until PCP appointment only.   A 30 day supply of his prescription medications have been sent to [facility] with #10 vicodin 7.5/325 mg tabs.    Time spent with patient 35 minutes: discussed medications; dme and home health verbalized understanding.      Ok Edwards NP [facility]  Contact [phone] Monday through Friday 8am- 5pm  After hours call [phone]

## 2018-05-29 NOTE — Progress Notes (Signed)
Chief Complaint  Patient presents with  . Routine Post Op    TKR 05/16/18    Postop day #13 status post right total knee   Patient will be discharged from nursing home today   Staples were removed his wound looks good his calf is supple he is ambulatory with a cane   He is on hydrocodone 7.5 mg for pain   He can start home physical therapy   He can stop the CPM does not need any TED stockings should continue aspirin   Follow-up in 4 weeks   Encounter Diagnosis  Name Primary?  . S/P TKR (total knee replacement), right//May 16, 2018 Yes

## 2018-05-31 ENCOUNTER — Telehealth: Payer: Self-pay | Admitting: Family Medicine

## 2018-05-31 ENCOUNTER — Other Ambulatory Visit: Payer: Self-pay

## 2018-05-31 ENCOUNTER — Ambulatory Visit (INDEPENDENT_AMBULATORY_CARE_PROVIDER_SITE_OTHER): Payer: Medicare HMO | Admitting: Family Medicine

## 2018-05-31 ENCOUNTER — Other Ambulatory Visit: Payer: Self-pay | Admitting: Family Medicine

## 2018-05-31 ENCOUNTER — Encounter: Payer: Self-pay | Admitting: Family Medicine

## 2018-05-31 VITALS — BP 118/72 | Ht 67.0 in | Wt 204.8 lb

## 2018-05-31 DIAGNOSIS — I2510 Atherosclerotic heart disease of native coronary artery without angina pectoris: Secondary | ICD-10-CM | POA: Diagnosis not present

## 2018-05-31 DIAGNOSIS — D649 Anemia, unspecified: Secondary | ICD-10-CM

## 2018-05-31 DIAGNOSIS — M545 Low back pain: Secondary | ICD-10-CM | POA: Diagnosis not present

## 2018-05-31 DIAGNOSIS — N189 Chronic kidney disease, unspecified: Secondary | ICD-10-CM | POA: Diagnosis not present

## 2018-05-31 DIAGNOSIS — I129 Hypertensive chronic kidney disease with stage 1 through stage 4 chronic kidney disease, or unspecified chronic kidney disease: Secondary | ICD-10-CM | POA: Diagnosis not present

## 2018-05-31 DIAGNOSIS — I10 Essential (primary) hypertension: Secondary | ICD-10-CM

## 2018-05-31 DIAGNOSIS — M1712 Unilateral primary osteoarthritis, left knee: Secondary | ICD-10-CM | POA: Diagnosis not present

## 2018-05-31 DIAGNOSIS — Z471 Aftercare following joint replacement surgery: Secondary | ICD-10-CM | POA: Diagnosis not present

## 2018-05-31 DIAGNOSIS — E1122 Type 2 diabetes mellitus with diabetic chronic kidney disease: Secondary | ICD-10-CM | POA: Diagnosis not present

## 2018-05-31 DIAGNOSIS — I700 Atherosclerosis of aorta: Secondary | ICD-10-CM

## 2018-05-31 DIAGNOSIS — G8929 Other chronic pain: Secondary | ICD-10-CM | POA: Diagnosis not present

## 2018-05-31 DIAGNOSIS — N289 Disorder of kidney and ureter, unspecified: Secondary | ICD-10-CM

## 2018-05-31 NOTE — Progress Notes (Signed)
   Subjective:    Patient ID: [name] [name], male    DOB: 1950/08/08, 68 y.o.   MRN: [id number]  HPI  Patient arrives for a follow up on recent hospitalization for knee replacement. Patient to have home health PT today. Patient doing overall well. Patient overall doing better having to take pain medicine intermittently takes 1 or 2/day sometimes 3.  He is running out of pain medicine but would like to get that through his orthopedist  Patient states he is trying to do well with his diet is trying to do some physical therapy will be doing some home physical therapy coming denies any shortness of breath or chest pain Review of Systems  Constitutional: Negative for diaphoresis and fatigue.  HENT: Negative for congestion and rhinorrhea.   Respiratory: Negative for cough and shortness of breath.   Cardiovascular: Negative for chest pain and leg swelling.  Gastrointestinal: Negative for abdominal pain and diarrhea.  Skin: Negative for color change and rash.  Neurological: Negative for dizziness and headaches.  Psychiatric/Behavioral: Negative for behavioral problems and confusion.       Objective:   Physical Exam Vitals signs reviewed.  Constitutional:      General: He is not in acute distress. HENT:     Head: Normocephalic and atraumatic.  Eyes:     General:        Right eye: No discharge.        Left eye: No discharge.  Neck:     Trachea: No tracheal deviation.  Cardiovascular:     Rate and Rhythm: Normal rate and regular rhythm.     Heart sounds: Normal heart sounds. No murmur.  Pulmonary:     Effort: Pulmonary effort is normal. No respiratory distress.     Breath sounds: Normal breath sounds.  Lymphadenopathy:     Cervical: No cervical adenopathy.  Skin:    General: Skin is warm and dry.  Neurological:     Mental Status: He is alert.     Coordination: Coordination normal.  Psychiatric:        Behavior: Behavior normal.    Right knee appears to be well-healed.   Patient has had some renal insufficiency in the past but recent lab work looked good.  Hemoglobin is slightly down which is consistent with his recent surgery     Assessment & Plan:  Patient with recovering from knee surgery.  Overall should do well  He will need some more pain medicine but he will get this through orthopedics.  We will send them a message  Patient has resumed his regular medicines blood pressure doing well he will follow-up with [place] again in 6 weeks.  We will recheck renal function as well as CBC.  At that time.

## 2018-05-31 NOTE — Patient Instructions (Signed)
Please do lab test 1 week before OV In 6 weeks

## 2018-05-31 NOTE — Telephone Encounter (Signed)
Needs labs ordered for recheck office visit 07/19/2018  Please call pt when ready

## 2018-05-31 NOTE — Telephone Encounter (Signed)
Last labs placed 04/05/2018 were CBC, Hgb, Bmet, and Lip. Please advise. Thank you

## 2018-05-31 NOTE — Telephone Encounter (Signed)
Labs placed and my chart message sent.

## 2018-05-31 NOTE — Telephone Encounter (Signed)
I recommend metabolic 7 and a CBC to be done diagnosis anemia and renal insufficiency this should be done before his follow-up visit-just put these into the system send him a my chart message that it has been put into the system and he will do his lab work 1 week in advance thank you

## 2018-06-01 ENCOUNTER — Encounter: Payer: Self-pay | Admitting: *Deleted

## 2018-06-01 ENCOUNTER — Telehealth: Payer: Self-pay | Admitting: Orthopedic Surgery

## 2018-06-01 ENCOUNTER — Other Ambulatory Visit: Payer: Self-pay | Admitting: *Deleted

## 2018-06-01 DIAGNOSIS — I129 Hypertensive chronic kidney disease with stage 1 through stage 4 chronic kidney disease, or unspecified chronic kidney disease: Secondary | ICD-10-CM | POA: Diagnosis not present

## 2018-06-01 DIAGNOSIS — G8929 Other chronic pain: Secondary | ICD-10-CM | POA: Diagnosis not present

## 2018-06-01 DIAGNOSIS — M1712 Unilateral primary osteoarthritis, left knee: Secondary | ICD-10-CM | POA: Diagnosis not present

## 2018-06-01 DIAGNOSIS — I2510 Atherosclerotic heart disease of native coronary artery without angina pectoris: Secondary | ICD-10-CM | POA: Diagnosis not present

## 2018-06-01 DIAGNOSIS — M545 Low back pain: Secondary | ICD-10-CM | POA: Diagnosis not present

## 2018-06-01 DIAGNOSIS — E1122 Type 2 diabetes mellitus with diabetic chronic kidney disease: Secondary | ICD-10-CM | POA: Diagnosis not present

## 2018-06-01 DIAGNOSIS — Z471 Aftercare following joint replacement surgery: Secondary | ICD-10-CM | POA: Diagnosis not present

## 2018-06-01 DIAGNOSIS — I700 Atherosclerosis of aorta: Secondary | ICD-10-CM | POA: Diagnosis not present

## 2018-06-01 DIAGNOSIS — N189 Chronic kidney disease, unspecified: Secondary | ICD-10-CM | POA: Diagnosis not present

## 2018-06-01 NOTE — Telephone Encounter (Signed)
I called to give verbal order.

## 2018-06-01 NOTE — Telephone Encounter (Signed)
Call from [place] at [facility] therapist, Heron Sabins, ph# [phone], requests verbal orders for therapy for patient for 1 time per week for 1 week, and 3 times per week for 1 week. Please advise.

## 2018-06-01 NOTE — Patient Outreach (Signed)
[facility]) Care Management  06/01/2018  [name] [name] 02-18-1951 [id number]   Subjective: Telephone call to patient's home number, spoke with patient, and HIPAA verified.  Discussed [place] Transition of care follow up, patient voiced understanding, and is in agreement to follow up.  Patient states he doing amazingly well, pleased with progress, recovery gone great, had follow up appointment with surgeon on 05/29/2018, and with primary MD on 05/31/2018. States all initial follow up appointments went well, he has a follow up appointments scheduled with surgeon on 06/26/2018, and primary MD on 07/19/2018.  States he is receiving home health services through [facility], physical therapy, and occupational therapy are going well. Patient states he is able to manage self care and has assistance as needed.  Patient voices understanding of medical diagnosis, surgery, and treatment plan. Patient states he does not have diabetes, only a family history of, and has discussed this with his primary MD.  Patient states he does not have any education material, transition of care, care coordination, disease management, disease monitoring, transportation, community resource, or pharmacy needs at this time.  States he is very appreciative of the follow up and is in agreement to receive [place] Management information.     Objective: Per KPN (Knowledge Performance Now, point of care tool) and chart review, patient hospitalized 05/16/2018 -05/19/2018 Primary osteoarthritis right knee, status post right total knee arthroplasty.    Patient discharge from hospital to inpatient rehab (05/19/2018 -05/29/2018) at [facility].   Patient also has history of hypertension, prostate cancer, chronic kidney disease, hyperlipidemia, Hypercholesteremia, and gout.    Assessment: Received [employer] Transition of care follow up referral on 06/01/2018.   Transition of care follow up completed, no care  management needs, and will proceed with case closure.      Plan: RNCM will send patient successful outreach letter, [facility] pamphlet, and magnet. RNCM will complete case closure due to follow up completed / no care management needs.  RNCM will send MD case closure letter.      Reg Bircher H. Annia Friendly, BSN, [place] Management [facility] Telephonic CM Phone: [phone] Fax: [phone]

## 2018-06-06 ENCOUNTER — Telehealth: Payer: Self-pay | Admitting: Orthopedic Surgery

## 2018-06-06 DIAGNOSIS — I2510 Atherosclerotic heart disease of native coronary artery without angina pectoris: Secondary | ICD-10-CM | POA: Diagnosis not present

## 2018-06-06 DIAGNOSIS — Z471 Aftercare following joint replacement surgery: Secondary | ICD-10-CM | POA: Diagnosis not present

## 2018-06-06 DIAGNOSIS — M1712 Unilateral primary osteoarthritis, left knee: Secondary | ICD-10-CM | POA: Diagnosis not present

## 2018-06-06 DIAGNOSIS — M545 Low back pain: Secondary | ICD-10-CM | POA: Diagnosis not present

## 2018-06-06 DIAGNOSIS — E1122 Type 2 diabetes mellitus with diabetic chronic kidney disease: Secondary | ICD-10-CM | POA: Diagnosis not present

## 2018-06-06 DIAGNOSIS — N189 Chronic kidney disease, unspecified: Secondary | ICD-10-CM | POA: Diagnosis not present

## 2018-06-06 DIAGNOSIS — Z96651 Presence of right artificial knee joint: Secondary | ICD-10-CM

## 2018-06-06 DIAGNOSIS — I700 Atherosclerosis of aorta: Secondary | ICD-10-CM | POA: Diagnosis not present

## 2018-06-06 DIAGNOSIS — I129 Hypertensive chronic kidney disease with stage 1 through stage 4 chronic kidney disease, or unspecified chronic kidney disease: Secondary | ICD-10-CM | POA: Diagnosis not present

## 2018-06-06 DIAGNOSIS — G8929 Other chronic pain: Secondary | ICD-10-CM | POA: Diagnosis not present

## 2018-06-06 NOTE — Telephone Encounter (Signed)
Done

## 2018-06-06 NOTE — Telephone Encounter (Signed)
Patient called to relay he will be discharged from home therapy as of this coming Friday, 06/09/18; asking for orders to out-patient therapy to [facility] [place]. Please advise.

## 2018-06-07 DIAGNOSIS — I2510 Atherosclerotic heart disease of native coronary artery without angina pectoris: Secondary | ICD-10-CM | POA: Diagnosis not present

## 2018-06-07 DIAGNOSIS — I129 Hypertensive chronic kidney disease with stage 1 through stage 4 chronic kidney disease, or unspecified chronic kidney disease: Secondary | ICD-10-CM | POA: Diagnosis not present

## 2018-06-07 DIAGNOSIS — M545 Low back pain: Secondary | ICD-10-CM | POA: Diagnosis not present

## 2018-06-07 DIAGNOSIS — M1712 Unilateral primary osteoarthritis, left knee: Secondary | ICD-10-CM | POA: Diagnosis not present

## 2018-06-07 DIAGNOSIS — G8929 Other chronic pain: Secondary | ICD-10-CM | POA: Diagnosis not present

## 2018-06-07 DIAGNOSIS — E1122 Type 2 diabetes mellitus with diabetic chronic kidney disease: Secondary | ICD-10-CM | POA: Diagnosis not present

## 2018-06-07 DIAGNOSIS — Z471 Aftercare following joint replacement surgery: Secondary | ICD-10-CM | POA: Diagnosis not present

## 2018-06-07 DIAGNOSIS — I700 Atherosclerosis of aorta: Secondary | ICD-10-CM | POA: Diagnosis not present

## 2018-06-07 DIAGNOSIS — N189 Chronic kidney disease, unspecified: Secondary | ICD-10-CM | POA: Diagnosis not present

## 2018-06-07 NOTE — Telephone Encounter (Signed)
Orders sent

## 2018-06-09 DIAGNOSIS — I129 Hypertensive chronic kidney disease with stage 1 through stage 4 chronic kidney disease, or unspecified chronic kidney disease: Secondary | ICD-10-CM | POA: Diagnosis not present

## 2018-06-09 DIAGNOSIS — Z471 Aftercare following joint replacement surgery: Secondary | ICD-10-CM | POA: Diagnosis not present

## 2018-06-09 DIAGNOSIS — G8929 Other chronic pain: Secondary | ICD-10-CM | POA: Diagnosis not present

## 2018-06-09 DIAGNOSIS — E1122 Type 2 diabetes mellitus with diabetic chronic kidney disease: Secondary | ICD-10-CM | POA: Diagnosis not present

## 2018-06-09 DIAGNOSIS — I700 Atherosclerosis of aorta: Secondary | ICD-10-CM | POA: Diagnosis not present

## 2018-06-09 DIAGNOSIS — M1712 Unilateral primary osteoarthritis, left knee: Secondary | ICD-10-CM | POA: Diagnosis not present

## 2018-06-09 DIAGNOSIS — M545 Low back pain: Secondary | ICD-10-CM | POA: Diagnosis not present

## 2018-06-09 DIAGNOSIS — I2510 Atherosclerotic heart disease of native coronary artery without angina pectoris: Secondary | ICD-10-CM | POA: Diagnosis not present

## 2018-06-09 DIAGNOSIS — N189 Chronic kidney disease, unspecified: Secondary | ICD-10-CM | POA: Diagnosis not present

## 2018-06-13 ENCOUNTER — Telehealth (HOSPITAL_COMMUNITY): Payer: Self-pay

## 2018-06-13 NOTE — Telephone Encounter (Signed)
Called and spoke to pt and informed him that our office will be closed until April 6 due to COVID-19. Inquired about his current HEP and how his knee mobility was doing and pt stated that he felt like everything was going well, he just had a little bit of difficulty getting his heel all the way towards his bottom but he was walking and ambulating stairs fine. PT encouraged pt to continue his HEP and to work on flexing his knee daily, trying to progress it a little more each day and that once our clinic is reopened, we will get him scheduled for an evaluation. Also informed pt that if he felt like he wasn't improving and/or he became concerned about his progress, he could call us back and we would see what we could do regarding potentially getting him in earlier than our tentative reopen date.    Geraldine Solar PT, DPT

## 2018-06-22 ENCOUNTER — Telehealth (HOSPITAL_COMMUNITY): Payer: Self-pay | Admitting: Family Medicine

## 2018-06-22 NOTE — Telephone Encounter (Signed)
06/22/18 I moved patient's appt to 1:45 and called and explained to patient why I needed to move from 12 Noon and he was fine with moving to 1:45.

## 2018-06-23 ENCOUNTER — Encounter (HOSPITAL_COMMUNITY): Payer: Self-pay | Admitting: Physical Therapy

## 2018-06-23 ENCOUNTER — Other Ambulatory Visit: Payer: Self-pay

## 2018-06-23 ENCOUNTER — Ambulatory Visit (HOSPITAL_COMMUNITY): Payer: Medicare HMO | Attending: Orthopedic Surgery | Admitting: Physical Therapy

## 2018-06-23 DIAGNOSIS — R269 Unspecified abnormalities of gait and mobility: Secondary | ICD-10-CM | POA: Diagnosis not present

## 2018-06-23 DIAGNOSIS — M25561 Pain in right knee: Secondary | ICD-10-CM | POA: Diagnosis not present

## 2018-06-23 DIAGNOSIS — M25661 Stiffness of right knee, not elsewhere classified: Secondary | ICD-10-CM

## 2018-06-23 DIAGNOSIS — M6281 Muscle weakness (generalized): Secondary | ICD-10-CM | POA: Diagnosis not present

## 2018-06-23 NOTE — Patient Instructions (Addendum)
Heel Raise: Bilateral (Standing)    Rise on balls of feet. Repeat _10___ times per set. Do ___1_ sets per session. Do __2__ sessions per day.  http://orth.exer.us/38   Copyright  VHI. All rights reserved.  Knee Extension (Sitting)    Place __0__ pound weight on left ankle and straighten knee fully, lower slowly. Repeat __10__ times per set. Do _1___ sets per session. Do __2__ sessions per day.  http://orth.exer.us/732   Copyright  VHI. All rights reserved.  Strengthening: Quadriceps Set    Tighten muscles on top of thighs by pushing knees down into surface. Hold _5___ seconds. Repeat _10___ times per set. Do __1__ sets per session. Do _2___ sessions per day.  http://orth.exer.us/602   Copyright  VHI. All rights reserved.  Stretching: Hamstring (Supine)    Supporting right thigh behind knee, slowly straighten knee until stretch is felt in back of thigh. Hold _30___ seconds. Repeat __10__ times per set. Do 1____ sets per session. Do ___2_ sessions per day.  http://orth.exer.us/656   Copyright  VHI. All rights reserved.  Self-Mobilization: Heel Slide (Supine)    Slide left heel toward buttocks until a gentle stretch is felt. Hold ___5_ seconds. Relax. Repeat ___10_ times per set. Do _1___ sets per session. Do _2___ sessions per day.  http://orth.exer.us/710   Copyright  VHI. All rights reserved.

## 2018-06-23 NOTE — Therapy (Signed)
[place], [place], [address] Phone: [phone]   Fax:  [phone]  Physical Therapy Evaluation  Patient Details  Name: [name] [name] MRN: [id number] Date of Birth: 1950/10/23 Referring Provider (PT): [name]    Encounter Date: 06/23/2018  PT End of Session - 06/23/18 1457    Visit Number  1    Number of Visits  12    Date for PT Re-Evaluation  07/23/18    Authorization - Visit Number  1    Authorization - Number of Visits  12    PT Start Time  4627    PT Stop Time  1430    PT Time Calculation (min)  45 min    Activity Tolerance  Patient tolerated treatment well    Behavior During Therapy  [facility] for tasks assessed/performed       Past Medical History:  Diagnosis Date  . Aortic atherosclerosis ([place]) 10/19/2016    Seen on chest x-ray July 2018  . Arthritis    right knee and hip   . Cancer [name])    prostate cancer   . Chronic kidney disease    prostate cancer   . Diabetes mellitus without complication ([place])   . GERD (gastroesophageal reflux disease)   . Gout   . Gout   . Hypercholesteremia   . Hyperglycemia   . Hyperlipidemia   . Hypertension   . Sleep apnea   . Status post THR (total hip replacement) 04/20/13 05/01/2013   Right total hip 04/20/2013 Dr. Lydia Guiles implant     Past Surgical History:  Procedure Laterality Date  . COLONOSCOPY    . COLONOSCOPY N/A 10/22/2013   Procedure: COLONOSCOPY;  Surgeon: Danie Binder, MD;  Location: [facility] ENDO SUITE;  Service: Endoscopy;  Laterality: N/A;  1115  . HERNIA REPAIR     right inguinal hernia repair   . NASAL SEPTUM SURGERY    . OTHER SURGICAL HISTORY     deviated septum surgery   . ROBOT ASSISTED LAPAROSCOPIC RADICAL PROSTATECTOMY  04/20/2011   Procedure: ROBOTIC ASSISTED LAPAROSCOPIC RADICAL PROSTATECTOMY;  Surgeon: Malka So, MD;  Location: [facility] ORS;  Service: Urology;  Laterality: N/A;  Bilateral lymph node dissection  . TOTAL HIP  ARTHROPLASTY Right 04/20/2013   Procedure: TOTAL HIP ARTHROPLASTY;  Surgeon: Carole Civil, MD;  Location: [facility] ORS;  Service: Orthopedics;  Laterality: Right;  . TOTAL KNEE ARTHROPLASTY Right 05/16/2018   Procedure: TOTAL KNEE ARTHROPLASTY;  Surgeon: Carole Civil, MD;  Location: [facility] ORS;  Service: Orthopedics;  Laterality: Right;    There were no vitals filed for this visit.   Subjective Assessment - 06/23/18 1358    Subjective  [name] [name]'s states that he had a Rt TKR on 05/16/2018.  HE has 7 steps to get into his home therefore he went to SNF for 10 days after dischage.  He then had home health for one week.  Prior to the surgery he was not using a cane.  He has steps in his home that are difficult for him to negotiate.      Pertinent History  OA, HTN, DM, S/P Rt THR     Limitations  Standing;Walking;Sitting;House hold activities    How long can you sit comfortably?  30 minutes     How long can you stand comfortably?  30 mintues     How long can you walk comfortably?  Pt ambulates cane the longest he has walked  for has been about 40 minutes     Patient Stated Goals  walk without the cane, be able to go up and down steps reciprocally, bend his knee better.      Pain Onset  1 to 4 weeks ago         [facility] PT Assessment - 06/23/18 0001      Assessment   Medical Diagnosis  Rt TKR    Referring Provider (PT)  [name]     Onset Date/Surgical Date  05/16/18    Next MD Visit  06/25/2008    Prior Therapy  SNF, HH      Precautions   Precautions  None      Restrictions   Weight Bearing Restrictions  No      Balance Screen   Has the patient fallen in the past 6 months  No    Has the patient had a decrease in activity level because of a fear of falling?   Yes    Is the patient reluctant to leave their home because of a fear of falling?   No      Prior Function   Level of Independence  Independent    Vocation  Retired    Photographer, walks       Cognition   Overall  Cognitive Status  Within Functional Limits for tasks assessed      Observation/Other Assessments   Focus on Therapeutic Outcomes (FOTO)   58      Functional Tests   Functional tests  Single leg stance;Sit to Stand      Single Leg Stance   Comments  LT: 30";  RT: 0      Sit to Stand   Comments  5x 18.23   shifts wt to left side      ROM / Strength   AROM / PROM / Strength  AROM;Strength      AROM   AROM Assessment Site  Hip;Knee;Ankle    Right/Left Hip  Right    Right/Left Knee  Right    Right Knee Extension  -10    Right Knee Flexion  95    Right/Left Ankle  Right      Strength   Strength Assessment Site  Hip;Knee;Ankle    Right/Left Hip  Right;Left    Right Hip Flexion  5/5    Right Hip Extension  3-/5    Right Hip ABduction  3-/5    Left Hip Extension  3/5    Left Hip ABduction  5/5    Right/Left Knee  Right    Right Knee Flexion  4-/5    Right Knee Extension  3+/5    Right/Left Ankle  Right    Right Ankle Dorsiflexion  5/5      Ambulation/Gait   Ambulation Distance (Feet)  565 Feet    Assistive device  Straight cane    Gait Pattern  Decreased hip/knee flexion - right;Decreased dorsiflexion - right    Gait Comments  3 minute test                 Objective measurements completed on examination: See above findings.      [facility] Adult PT Treatment/Exercise - 06/23/18 0001      Exercises   Exercises  Knee/Hip      Knee/Hip Exercises: Stretches   Active Hamstring Stretch  Right;3 reps    Active Hamstring Stretch Limitations  supine 20" at both extension and flexion  Knee/Hip Exercises: Standing   Heel Raises  Both;10 reps    SLS  x5 with two finger hold      Knee/Hip Exercises: Seated   Sit to Sand  5 reps      Knee/Hip Exercises: Supine   Quad Sets  Right;10 reps    Heel Slides  Right;10 reps             PT Education - 06/23/18 1456    Education Details  HEP    Person(s) Educated  Patient    Methods  Explanation;Handout     Comprehension  Verbalized understanding;Returned demonstration       PT Short Term Goals - 06/23/18 1514      PT SHORT TERM GOAL #1   Title  Pt Rt knee extension to be less than five degrees to allow more normalized gait pattern.     Time  2    Period  Weeks    Status  New    Target Date  07/07/18      PT SHORT TERM GOAL #2   Title  Pt Rt knee flexion to be to 110 to allow pt to sit for an hour in comfort for meals and socializing.     Time  2    Period  Weeks    Status  New      PT SHORT TERM GOAL #3   Title  Pt strength to be increased by 1/2 grade to allow pt to rise from soft low lying surface, ie couch, without difficulty.     Time  2    Period  Weeks    Status  New      PT SHORT TERM GOAL #4   Title  Pt to be able to single leg stance for 10 seconds to allow pt to feel confident walking on uneven ground.     Time  2    Period  Weeks    Status  New      PT SHORT TERM GOAL #5   Title  Pt pain in his RT knee to be no greater than a 3/10 to allow pt to sleep waking only one time throughout the night.     Time  2    Period  Weeks    Status  New        PT Long Term Goals - 06/23/18 1518      PT LONG TERM GOAL #1   Title  Pt Rt knee extension to be at 0 to allow a normal gait pattern.     Time  4    Period  Weeks    Status  New    Target Date  07/21/18      PT LONG TERM GOAL #2   Title  Pt Rt knee flextion to be at 120 to allow pt to be able to squat to work out in the yard.     Time  4    Period  Weeks      PT LONG TERM GOAL #3   Title  Pt LE strength to be increased one grade to allow pt to ascend and descend 7 steps in a reciprocal manner.     Time  4    Period  Weeks    Status  New      PT LONG TERM GOAL #4   Title  Pt to be able to single leg stance on his right leg for 20 seconds to allow pt to  feel comfortable walking without his cane both in and outside.     Time  4    Period  Weeks    Status  New             Plan - 06/23/18 1500     Clinical Impression Statement  Mr. Mulhall's is a 68 yo male who had a Rt TKR on 05/16/2018, after discharge, he went to SNF for a week followed by home health for a week.  He has been completing the home exercises that he was given by home health but is fearful that he has lost ground on his own, therefore, he is being referred to skilled PT.  Evaluation demonstrates decreased ROM, decreased strength, decreased balance, increased pain and gait alteration.  Mr. Steinhoff will benefit from skilled PT to address these issues and maximize his functional ability.      Personal Factors and Comorbidities  Age;Comorbidity 2    Comorbidities  HTN,DM, OA     Examination-Activity Limitations  Bend;Dressing;Squat;Stairs;Stand    Examination-Participation Restrictions  Cleaning;Driving;Yard Work    Stability/Clinical Decision Making  Stable/Uncomplicated    Designer, jewellery  Low    Rehab Potential  Good    PT Frequency  3x / week    PT Duration  4 weeks    PT Treatment/Interventions  ADLs/Self Care Home Management;Gait training;Stair training;Functional mobility training;Therapeutic activities;Therapeutic exercise;Balance training;Patient/family education;Manual techniques    PT Next Visit Plan  begin standing knee flexion, standing and supine TKR, Standing knee flexion , rocker board and manual to improve patella mobility.   Progress to strengthening and balance .    PT Home Exercise Plan  heel raises, Single leg stance, LAQ, Q-set, supine hamstring stretch .    Consulted and Agree with Plan of Care  Patient       Patient will benefit from skilled therapeutic intervention in order to improve the following deficits and impairments:  Abnormal gait, Decreased activity tolerance, Decreased balance, Decreased range of motion, Decreased strength, Increased edema, Increased fascial restricitons, Impaired flexibility, Pain  Visit Diagnosis: Muscle weakness (generalized) - Plan: PT plan of care  cert/re-cert  Stiffness of right knee, not elsewhere classified - Plan: PT plan of care cert/re-cert  Acute pain of right knee - Plan: PT plan of care cert/re-cert  Gait abnormality - Plan: PT plan of care cert/re-cert     Problem List Patient Active Problem List   Diagnosis Date Noted  . Cellulitis of right knee 05/29/2018  . Benign essential hypertension 05/22/2018  . Status post right knee replacement 05/22/2018  . Constipation due to opioid therapy 05/22/2018  . Adult BMI 32.0-32.9 kg/sq m 05/22/2018  . Primary osteoarthritis of knee 05/16/2018  . Aortic atherosclerosis ([place]) 10/19/2016  . History of left hip replacement 05/01/2013  . Prediabetes 03/26/2013  . Hyperlipidemia 03/26/2013  . Microproteinuria 03/26/2013  . Gout 03/26/2013  . Back pain 09/05/2012  . Prostate cancer [facility]) 04/20/2011   Rayetta Humphrey, PT CLT [phone] 06/23/2018, 3:30 PM  [facility] [address] [place], [place], [address] Phone: [phone]   Fax:  [phone]  Name: [name] [name] MRN: [id number] Date of Birth: [birth year]

## 2018-06-26 ENCOUNTER — Other Ambulatory Visit: Payer: Self-pay

## 2018-06-26 ENCOUNTER — Encounter: Payer: Self-pay | Admitting: Orthopedic Surgery

## 2018-06-26 ENCOUNTER — Ambulatory Visit (HOSPITAL_COMMUNITY): Payer: Medicare HMO | Admitting: Physical Therapy

## 2018-06-26 ENCOUNTER — Ambulatory Visit (INDEPENDENT_AMBULATORY_CARE_PROVIDER_SITE_OTHER): Payer: Medicare HMO | Admitting: Orthopedic Surgery

## 2018-06-26 DIAGNOSIS — R269 Unspecified abnormalities of gait and mobility: Secondary | ICD-10-CM

## 2018-06-26 DIAGNOSIS — M25561 Pain in right knee: Secondary | ICD-10-CM

## 2018-06-26 DIAGNOSIS — Z96651 Presence of right artificial knee joint: Secondary | ICD-10-CM | POA: Diagnosis not present

## 2018-06-26 DIAGNOSIS — M25661 Stiffness of right knee, not elsewhere classified: Secondary | ICD-10-CM

## 2018-06-26 DIAGNOSIS — M6281 Muscle weakness (generalized): Secondary | ICD-10-CM

## 2018-06-26 NOTE — Progress Notes (Signed)
Virtual Visit via Telephone Note  I connected with [name] [name] on 06/26/18 at  3:20 PM EDT by telephone and verified that I am speaking with the correct person using two identifiers.  Postop visit knee replacement May 16, 2018  Patient is doing well not requiring any major medications are related to his surgery at this time  He is using a cane thinks will be off of it in about a week or 2  He is in therapy finally  He is working on balance strength and flexion  He agrees to see me in 6 weeks and person  Encounter Diagnosis  Name Primary?  . S/P TKR (total knee replacement), right//May 16, 2018 Yes      I discussed the limitations, risks, security and privacy concerns of performing an evaluation and management service by telephone and the availability of in person appointments. I also discussed with the patient that there may be a patient responsible charge related to this service. The patient expressed understanding and agreed to proceed.  I discussed the assessment and treatment plan with the patient. The patient was provided an opportunity to ask questions and all were answered. The patient agreed with the plan and demonstrated an understanding of the instructions.   The patient was advised to call back or seek an in-person evaluation if the symptoms worsen or if the condition fails to improve as anticipated.  I provided 4 minutes of non-face-to-face time during this encounter.   [name], MD

## 2018-06-26 NOTE — Therapy (Signed)
[place] [address] [place], [place], [address] Phone: [phone]   Fax:  [phone]  Physical Therapy Treatment  Patient Details  Name: [name] [name] MRN: [id number] Date of Birth: 05-06-1950 Referring Provider (PT): [name]    Encounter Date: 06/26/2018  PT End of Session - 06/26/18 1250    Visit Number  2    Number of Visits  12    Date for PT Re-Evaluation  07/23/18    Authorization - Visit Number  2    Authorization - Number of Visits  12    PT Start Time  0850    PT Stop Time  0932    PT Time Calculation (min)  42 min    Activity Tolerance  Patient tolerated treatment well    Behavior During Therapy  [facility] for tasks assessed/performed       Past Medical History:  Diagnosis Date  . Aortic atherosclerosis ([place]) 10/19/2016    Seen on chest x-ray July 2018  . Arthritis    right knee and hip   . Cancer [facility])    prostate cancer   . Chronic kidney disease    prostate cancer   . Diabetes mellitus without complication ([place])   . GERD (gastroesophageal reflux disease)   . Gout   . Gout   . Hypercholesteremia   . Hyperglycemia   . Hyperlipidemia   . Hypertension   . Sleep apnea   . Status post THR (total hip replacement) 04/20/13 05/01/2013   Right total hip 04/20/2013 Dr. Lydia Guiles implant     Past Surgical History:  Procedure Laterality Date  . COLONOSCOPY    . COLONOSCOPY N/A 10/22/2013   Procedure: COLONOSCOPY;  Surgeon: Danie Binder, MD;  [facility];  Service: Endoscopy;  Laterality: N/A;  1115  . HERNIA REPAIR     right inguinal hernia repair   . NASAL SEPTUM SURGERY    . OTHER SURGICAL HISTORY     deviated septum surgery   . ROBOT ASSISTED LAPAROSCOPIC RADICAL PROSTATECTOMY  04/20/2011   Procedure: ROBOTIC ASSISTED LAPAROSCOPIC RADICAL PROSTATECTOMY;  Surgeon: Malka So, MD;  Location: [facility];  Service: Urology;  Laterality: N/A;  Bilateral lymph node dissection  . TOTAL HIP  ARTHROPLASTY Right 04/20/2013   Procedure: TOTAL HIP ARTHROPLASTY;  Surgeon: Carole Civil, MD;  Location: [facility] ORS;  Service: Orthopedics;  Laterality: Right;  . TOTAL KNEE ARTHROPLASTY Right 05/16/2018   Procedure: TOTAL KNEE ARTHROPLASTY;  Surgeon: Carole Civil, MD;  Location: [facility] ORS;  Service: Orthopedics;  Laterality: Right;    There were no vitals filed for this visit.  Subjective Assessment - 06/26/18 0907    Subjective  pt reports no pain just stiffness.  Reports complaince with HEP.  States he is completing steps at home without difficulty, however completing one at a time not reciprocally.    Currently in Pain?  No/denies                       [facility] Adult PT Treatment/Exercise - 06/26/18 0001      Knee/Hip Exercises: Stretches   Active Hamstring Stretch  Right;3 reps    Active Hamstring Stretch Limitations  standing 12" step 30" holds    Knee: Self-Stretch to increase Flexion  Right;10 seconds;Limitations    Knee: Self-Stretch Limitations  10 reps with 12" box      Knee/Hip Exercises: Aerobic   Stationary Bike  seat 15  rocking 4 minutes      Knee/Hip Exercises: Standing   Heel Raises  15 reps    Knee Flexion  Right;10 reps    Terminal Knee Extension  Right;10 reps    Theraband Level (Terminal Knee Extension)  Level 4 (Blue)    SLS  5X no UE max of 15" on Rt LE      Knee/Hip Exercises: Seated   Sit to Sand  10 reps;without UE support      Knee/Hip Exercises: Supine   Quad Sets  Right;10 reps    Heel Slides  Right;10 reps    Knee Extension  AROM;Right    Knee Extension Limitations  6   was 10 at initial evaluation   Knee Flexion  AROM;Right    Knee Flexion Limitations  100   was 95 at initial evaluation     Manual Therapy   Manual Therapy  Myofascial release    Manual therapy comments  supine to Rt knee    Myofascial Release  to reduce adhesions periemter of knee prior to [facility]               PT Short Term Goals - 06/26/18 1254       PT SHORT TERM GOAL #1   Title  Pt Rt knee extension to be less than five degrees to allow more normalized gait pattern.     Time  2    Period  Weeks    Status  On-going    Target Date  07/07/18      PT SHORT TERM GOAL #2   Title  Pt Rt knee flexion to be to 110 to allow pt to sit for an hour in comfort for meals and socializing.     Time  2    Period  Weeks    Status  On-going      PT SHORT TERM GOAL #3   Title  Pt strength to be increased by 1/2 grade to allow pt to rise from soft low lying surface, ie couch, without difficulty.     Time  2    Period  Weeks    Status  On-going      PT SHORT TERM GOAL #4   Title  Pt to be able to single leg stance for 10 seconds to allow pt to feel confident walking on uneven ground.     Time  2    Period  Weeks    Status  On-going      PT SHORT TERM GOAL #5   Title  Pt pain in his RT knee to be no greater than a 3/10 to allow pt to sleep waking only one time throughout the night.     Time  2    Period  Weeks    Status  On-going        PT Long Term Goals - 06/26/18 1254      PT LONG TERM GOAL #1   Title  Pt Rt knee extension to be at 0 to allow a normal gait pattern.     Time  4    Period  Weeks    Status  On-going      PT LONG TERM GOAL #2   Title  Pt Rt knee flextion to be at 120 to allow pt to be able to squat to work out in the yard.     Time  4    Period  Weeks    Status  On-going  PT LONG TERM GOAL #3   Title  Pt LE strength to be increased one grade to allow pt to ascend and descend 7 steps in a reciprocal manner.     Time  4    Period  Weeks    Status  On-going      PT LONG TERM GOAL #4   Title  Pt to be able to single leg stance on his right leg for 20 seconds to allow pt to feel comfortable walking without his cane both in and outside.     Time  4    Period  Weeks    Status  On-going      PT LONG TERM GOAL #5   Status  On-going            Plan - 06/26/18 1250    Clinical Impression Statement   Reveiwed POC and HEP with patient.  Began on bike, rocking for AROM.  Added standing stretches for flexion, hamstring stretches and TKE.  Pt given BTB for home use with instructions.  Completed myofascial release with noted restrictions perimeter of knee but most tenderness laterally.  Able to achieve greater AROM following manual at 6-100 (was 10-95 last session).  Pt reported knee overall feeling better at end of session, looser.  Improved SLS time on Rt to 15 seconds without use of UE's.    Personal Factors and Comorbidities  Age;Comorbidity 2    Comorbidities  HTN,DM, OA     Examination-Activity Limitations  Bend;Dressing;Squat;Stairs;Stand    Examination-Participation Restrictions  Cleaning;Driving;Yard Work    Stability/Clinical Decision Making  Stable/Uncomplicated    Rehab Potential  Good    PT Frequency  3x / week    PT Duration  4 weeks    PT Treatment/Interventions  ADLs/Self Care Home Management;Gait training;Stair training;Functional mobility training;Therapeutic activities;Therapeutic exercise;Balance training;Patient/family education;Manual techniques    PT Next Visit Plan  Next session begin rocker board and continue manual to improve patella mobility and reduce adhesions.   Progress to strengthening and balance .    PT Home Exercise Plan  heel raises, Single leg stance, LAQ, Q-set, supine hamstring stretch . 4/6: standing TKE with BTB, hamstring stretch standing, knee flexion stretch standing    Consulted and Agree with Plan of Care  Patient       Patient will benefit from skilled therapeutic intervention in order to improve the following deficits and impairments:  Abnormal gait, Decreased activity tolerance, Decreased balance, Decreased range of motion, Decreased strength, Increased edema, Increased fascial restricitons, Impaired flexibility, Pain  Visit Diagnosis: Muscle weakness (generalized)  Stiffness of right knee, not elsewhere classified  Acute pain of right  knee  Gait abnormality     Problem List Patient Active Problem List   Diagnosis Date Noted  . Cellulitis of right knee 05/29/2018  . Benign essential hypertension 05/22/2018  . Status post right knee replacement 05/22/2018  . Constipation due to opioid therapy 05/22/2018  . Adult BMI 32.0-32.9 kg/sq m 05/22/2018  . Primary osteoarthritis of knee 05/16/2018  . Aortic atherosclerosis ([place]) 10/19/2016  . History of left hip replacement 05/01/2013  . Prediabetes 03/26/2013  . Hyperlipidemia 03/26/2013  . Microproteinuria 03/26/2013  . Gout 03/26/2013  . Back pain 09/05/2012  . Prostate cancer ([place]) 04/20/2011   Teena Irani, PTA/CLT [phone]  Teena Irani 06/26/2018, 12:55 PM  [place] [address] [place], [place], [address] Phone: [phone]   Fax:  [phone]  Name: [name] Najjar  [name] MRN: [id number] Date of Birth: 1951-01-28

## 2018-06-28 ENCOUNTER — Ambulatory Visit (HOSPITAL_COMMUNITY): Payer: Medicare HMO | Admitting: Physical Therapy

## 2018-06-28 ENCOUNTER — Other Ambulatory Visit: Payer: Self-pay

## 2018-06-28 DIAGNOSIS — M6281 Muscle weakness (generalized): Secondary | ICD-10-CM | POA: Diagnosis not present

## 2018-06-28 DIAGNOSIS — M25661 Stiffness of right knee, not elsewhere classified: Secondary | ICD-10-CM

## 2018-06-28 DIAGNOSIS — M25561 Pain in right knee: Secondary | ICD-10-CM

## 2018-06-28 DIAGNOSIS — R269 Unspecified abnormalities of gait and mobility: Secondary | ICD-10-CM

## 2018-06-28 NOTE — Therapy (Signed)
[place], [place], [address] Phone: [phone]   Fax:  [phone]  Physical Therapy Treatment  Patient Details  Name: [name] [name] MRN: [id number] Date of Birth: [birth year] Referring Provider (PT): [name]    Encounter Date: 06/28/2018  PT End of Session - 06/28/18 1345    Visit Number  3    Number of Visits  12    Date for PT Re-Evaluation  07/23/18    Authorization - Visit Number  3    Authorization - Number of Visits  12    PT Start Time  0900    PT Stop Time  0945    PT Time Calculation (min)  45 min    Activity Tolerance  Patient tolerated treatment well    Behavior During Therapy  [facility] for tasks assessed/performed       Past Medical History:  Diagnosis Date  . Aortic atherosclerosis ([place]) 10/19/2016    Seen on chest x-ray July 2018  . Arthritis    right knee and hip   . Cancer [facility])    prostate cancer   . Chronic kidney disease    prostate cancer   . Diabetes mellitus without complication ([place])   . GERD (gastroesophageal reflux disease)   . Gout   . Gout   . Hypercholesteremia   . Hyperglycemia   . Hyperlipidemia   . Hypertension   . Sleep apnea   . Status post THR (total hip replacement) 04/20/13 05/01/2013   Right total hip 04/20/2013 Dr. Lydia Guiles implant     Past Surgical History:  Procedure Laterality Date  . COLONOSCOPY    . COLONOSCOPY N/A 10/22/2013   Procedure: COLONOSCOPY;  Surgeon: Danie Binder, MD;  [facility];  Service: Endoscopy;  Laterality: N/A;  1115  . HERNIA REPAIR     right inguinal hernia repair   . NASAL SEPTUM SURGERY    . OTHER SURGICAL HISTORY     deviated septum surgery   . ROBOT ASSISTED LAPAROSCOPIC RADICAL PROSTATECTOMY  04/20/2011   Procedure: ROBOTIC ASSISTED LAPAROSCOPIC RADICAL PROSTATECTOMY;  Surgeon: Malka So, MD;  Location[facility];  Service: Urology;  Laterality: N/A;  Bilateral lymph node dissection  . TOTAL HIP  ARTHROPLASTY Right 04/20/2013   Procedure: TOTAL HIP ARTHROPLASTY;  Surgeon: Carole Civil, MD;  Location: [facility];  Service: Orthopedics;  Laterality: Right;  . TOTAL KNEE ARTHROPLASTY Right 05/16/2018   Procedure: TOTAL KNEE ARTHROPLASTY;  Surgeon: Carole Civil, MD;  Location: [facility];  Service: Orthopedics;  Laterality: Right;    There were no vitals filed for this visit.  Subjective Assessment - 06/28/18 0912    Subjective  Pt states he is just stiff as usual.  STates he was a little sore after last session but was able to move it better.  STates his sleeping is not interrupted by his knee.     Currently in Pain?  No/denies                       [facility] Adult PT Treatment/Exercise - 06/28/18 0001      Knee/Hip Exercises: Stretches   Active Hamstring Stretch  Right;3 reps    Active Hamstring Stretch Limitations  standing 12" step 30" holds    Knee: Self-Stretch to increase Flexion  Right;10 seconds;Limitations    Knee: Self-Stretch Limitations  10 reps with 12" box    Gastroc Stretch  Both;3 reps;30  seconds    Gastroc Stretch Limitations  slant board      Knee/Hip Exercises: Aerobic   Stationary Bike  seat 15 rocking 4 minutes      Knee/Hip Exercises: Standing   Heel Raises  15 reps    Knee Flexion  Right;15 reps    Hip Abduction  Both;10 reps    Gait Training  around gymn with cues for reduced antalgia      Knee/Hip Exercises: Supine   Quad Sets  Right;10 reps    Heel Slides  Right;10 reps    Knee Extension  AROM;Right    Knee Extension Limitations  6    Knee Flexion  AROM;Right    Knee Flexion Limitations  103      Manual Therapy   Manual Therapy  Myofascial release    Manual therapy comments  supine to Rt knee    Myofascial Release  to reduce adhesions periemter of knee prior to [facility]               PT Short Term Goals - 06/26/18 1254      PT SHORT TERM GOAL #1   Title  Pt Rt knee extension to be less than five degrees to allow more  normalized gait pattern.     Time  2    Period  Weeks    Status  On-going    Target Date  07/07/18      PT SHORT TERM GOAL #2   Title  Pt Rt knee flexion to be to 110 to allow pt to sit for an hour in comfort for meals and socializing.     Time  2    Period  Weeks    Status  On-going      PT SHORT TERM GOAL #3   Title  Pt strength to be increased by 1/2 grade to allow pt to rise from soft low lying surface, ie couch, without difficulty.     Time  2    Period  Weeks    Status  On-going      PT SHORT TERM GOAL #4   Title  Pt to be able to single leg stance for 10 seconds to allow pt to feel confident walking on uneven ground.     Time  2    Period  Weeks    Status  On-going      PT SHORT TERM GOAL #5   Title  Pt pain in his RT knee to be no greater than a 3/10 to allow pt to sleep waking only one time throughout the night.     Time  2    Period  Weeks    Status  On-going        PT Long Term Goals - 06/26/18 1254      PT LONG TERM GOAL #1   Title  Pt Rt knee extension to be at 0 to allow a normal gait pattern.     Time  4    Period  Weeks    Status  On-going      PT LONG TERM GOAL #2   Title  Pt Rt knee flextion to be at 120 to allow pt to be able to squat to work out in the yard.     Time  4    Period  Weeks    Status  On-going      PT LONG TERM GOAL #3   Title  Pt LE strength to be  increased one grade to allow pt to ascend and descend 7 steps in a reciprocal manner.     Time  4    Period  Weeks    Status  On-going      PT LONG TERM GOAL #4   Title  Pt to be able to single leg stance on his right leg for 20 seconds to allow pt to feel comfortable walking without his cane both in and outside.     Time  4    Period  Weeks    Status  On-going      PT LONG TERM GOAL #5   Status  On-going            Plan - 06/28/18 1345    Clinical Impression Statement  pt with improving mobiltiy in knee, easier tissue glide than last visit.  AROM with continued  improvement 6-103 degrees.  Began ambulation without using SPC and noted antalgia due to weakness, not pain.  Added standing hip abduction and worked on slower, heel to toe gait.      Personal Factors and Comorbidities  Age;Comorbidity 2    Comorbidities  HTN,DM, OA     Examination-Activity Limitations  Bend;Dressing;Squat;Stairs;Stand    Examination-Participation Restrictions  Cleaning;Driving;Yard Work    Stability/Clinical Decision Making  Stable/Uncomplicated    Rehab Potential  Good    PT Frequency  3x / week    PT Duration  4 weeks    PT Treatment/Interventions  ADLs/Self Care Home Management;Gait training;Stair training;Functional mobility training;Therapeutic activities;Therapeutic exercise;Balance training;Patient/family education;Manual techniques    PT Next Visit Plan  Next session begin rocker board and continue manual to improve patella mobility and reduce adhesions.   Progress to strengthening and balance .    PT Home Exercise Plan  heel raises, Single leg stance, LAQ, Q-set, supine hamstring stretch . 4/6: standing TKE with BTB, hamstring stretch standing, knee flexion stretch standing    Consulted and Agree with Plan of Care  Patient       Patient will benefit from skilled therapeutic intervention in order to improve the following deficits and impairments:  Abnormal gait, Decreased activity tolerance, Decreased balance, Decreased range of motion, Decreased strength, Increased edema, Increased fascial restricitons, Impaired flexibility, Pain  Visit Diagnosis: Muscle weakness (generalized)  Stiffness of right knee, not elsewhere classified  Acute pain of right knee  Gait abnormality     Problem List Patient Active Problem List   Diagnosis Date Noted  . Cellulitis of right knee 05/29/2018  . Benign essential hypertension 05/22/2018  . Status post right knee replacement 05/22/2018  . Constipation due to opioid therapy 05/22/2018  . Adult BMI 32.0-32.9 kg/sq m  05/22/2018  . Primary osteoarthritis of knee 05/16/2018  . Aortic atherosclerosis ([place]) 10/19/2016  . History of left hip replacement 05/01/2013  . Prediabetes 03/26/2013  . Hyperlipidemia 03/26/2013  . Microproteinuria 03/26/2013  . Gout 03/26/2013  . Back pain 09/05/2012  . Prostate cancer ([place]) 04/20/2011   Teena Irani, PTA/CLT [phone]  Teena Irani 06/28/2018, 1:47 PM  [place], [place], [address] Phone: [phone]   Fax:  [phone]  Name: [name] [name] MRN: [id number] Date of Birth: 11/21/1950

## 2018-06-30 ENCOUNTER — Ambulatory Visit (HOSPITAL_COMMUNITY): Payer: Medicare HMO | Admitting: Physical Therapy

## 2018-06-30 ENCOUNTER — Other Ambulatory Visit: Payer: Self-pay

## 2018-06-30 DIAGNOSIS — R269 Unspecified abnormalities of gait and mobility: Secondary | ICD-10-CM

## 2018-06-30 DIAGNOSIS — M25661 Stiffness of right knee, not elsewhere classified: Secondary | ICD-10-CM

## 2018-06-30 DIAGNOSIS — M25561 Pain in right knee: Secondary | ICD-10-CM

## 2018-06-30 DIAGNOSIS — M6281 Muscle weakness (generalized): Secondary | ICD-10-CM | POA: Diagnosis not present

## 2018-06-30 NOTE — Therapy (Signed)
[facility] [address] [place], [place], [address] Phone: [phone]   Fax:  [phone]  Physical Therapy Treatment  Patient Details  Name: [name] [name] MRN: [id number] Date of Birth: April 10, 1950 Referring Provider (PT): [name]    Encounter Date: 06/30/2018  PT End of Session - 06/30/18 0933    Visit Number  4    Number of Visits  12    Date for PT Re-Evaluation  07/23/18    Authorization - Visit Number  4    Authorization - Number of Visits  12    PT Start Time  0836    PT Stop Time  0925    PT Time Calculation (min)  49 min    Activity Tolerance  Patient tolerated treatment well    Behavior During Therapy  [facility] for tasks assessed/performed       Past Medical History:  Diagnosis Date  . Aortic atherosclerosis ([place]) 10/19/2016    Seen on chest x-ray July 2018  . Arthritis    right knee and hip   . Cancer [facility])    prostate cancer   . Chronic kidney disease    prostate cancer   . Diabetes mellitus without complication ([place])   . GERD (gastroesophageal reflux disease)   . Gout   . Gout   . Hypercholesteremia   . Hyperglycemia   . Hyperlipidemia   . Hypertension   . Sleep apnea   . Status post THR (total hip replacement) 04/20/13 05/01/2013   Right total hip 04/20/2013 Dr. Lydia Guiles implant     Past Surgical History:  Procedure Laterality Date  . COLONOSCOPY    . COLONOSCOPY N/A 10/22/2013   Procedure: COLONOSCOPY;  Surgeon: Danie Binder, MD;  [facility];  Service: Endoscopy;  Laterality: N/A;  1115  . HERNIA REPAIR     right inguinal hernia repair   . NASAL SEPTUM SURGERY    . OTHER SURGICAL HISTORY     deviated septum surgery   . ROBOT ASSISTED LAPAROSCOPIC RADICAL PROSTATECTOMY  04/20/2011   Procedure: ROBOTIC ASSISTED LAPAROSCOPIC RADICAL PROSTATECTOMY;  Surgeon: Malka So, MD;  Location: [facility];  Service: Urology;  Laterality: N/A;  Bilateral lymph node dissection  . TOTAL HIP  ARTHROPLASTY Right 04/20/2013   Procedure: TOTAL HIP ARTHROPLASTY;  Surgeon: Carole Civil, MD;  Location: [facility];  Service: Orthopedics;  Laterality: Right;  . TOTAL KNEE ARTHROPLASTY Right 05/16/2018   Procedure: TOTAL KNEE ARTHROPLASTY;  Surgeon: Carole Civil, MD;  Location: [facility];  Service: Orthopedics;  Laterality: Right;    There were no vitals filed for this visit.  Subjective Assessment - 06/30/18 0837    Subjective  pt states he can tell it's getting much looser.  Wasn't as stiff when he got up this morning.     Currently in Pain?  No/denies                       [facility] Adult PT Treatment/Exercise - 06/30/18 0001      Knee/Hip Exercises: Stretches   Active Hamstring Stretch  Right;3 reps    Active Hamstring Stretch Limitations  standing 12" step 30" holds    Knee: Self-Stretch to increase Flexion  Right;10 seconds;Limitations    Knee: Self-Stretch Limitations  10 reps with 12" box    Gastroc Stretch  Both;3 reps;30 seconds    Gastroc Stretch Limitations  slant board      Knee/Hip  Exercises: Aerobic   Stationary Bike  seat 15 rocking 4 minutes      Knee/Hip Exercises: Standing   Heel Raises  15 reps    Knee Flexion  Right;15 reps    Hip Abduction  Both;10 reps    Hip Extension  Both;10 reps    Rocker Board  1 minute;Limitations    Rocker Board Limitations  Rt/Lt 15 reps each with cues    SLS with Vectors  both 5X5" holds with 1 HHA    Gait Training  around gymn with cues for reduced antalgia      Knee/Hip Exercises: Supine   Quad Sets  Right;10 reps    Heel Slides  Right;10 reps    Knee Extension  AROM;Right    Knee Extension Limitations  5    Knee Flexion  AROM;Right    Knee Flexion Limitations  100      Manual Therapy   Manual Therapy  Myofascial release    Manual therapy comments  supine to Rt knee    Myofascial Release  to reduce adhesions periemter of knee prior to [facility]               PT Short Term Goals - 06/26/18 1254       PT SHORT TERM GOAL #1   Title  Pt Rt knee extension to be less than five degrees to allow more normalized gait pattern.     Time  2    Period  Weeks    Status  On-going    Target Date  07/07/18      PT SHORT TERM GOAL #2   Title  Pt Rt knee flexion to be to 110 to allow pt to sit for an hour in comfort for meals and socializing.     Time  2    Period  Weeks    Status  On-going      PT SHORT TERM GOAL #3   Title  Pt strength to be increased by 1/2 grade to allow pt to rise from soft low lying surface, ie couch, without difficulty.     Time  2    Period  Weeks    Status  On-going      PT SHORT TERM GOAL #4   Title  Pt to be able to single leg stance for 10 seconds to allow pt to feel confident walking on uneven ground.     Time  2    Period  Weeks    Status  On-going      PT SHORT TERM GOAL #5   Title  Pt pain in his RT knee to be no greater than a 3/10 to allow pt to sleep waking only one time throughout the night.     Time  2    Period  Weeks    Status  On-going        PT Long Term Goals - 06/26/18 1254      PT LONG TERM GOAL #1   Title  Pt Rt knee extension to be at 0 to allow a normal gait pattern.     Time  4    Period  Weeks    Status  On-going      PT LONG TERM GOAL #2   Title  Pt Rt knee flextion to be at 120 to allow pt to be able to squat to work out in the yard.     Time  4    Period  Weeks    Status  On-going      PT LONG TERM GOAL #3   Title  Pt LE strength to be increased one grade to allow pt to ascend and descend 7 steps in a reciprocal manner.     Time  4    Period  Weeks    Status  On-going      PT LONG TERM GOAL #4   Title  Pt to be able to single leg stance on his right leg for 20 seconds to allow pt to feel comfortable walking without his cane both in and outside.     Time  4    Period  Weeks    Status  On-going      PT LONG TERM GOAL #5   Status  On-going            Plan - 06/30/18 0933    Clinical Impression Statement   continued with primary focus on improving AROM of Rt knee.  Pt with improving mobility of tissue, less stiffness and no pain.  Continued ot work on imrproving gait without AD, noted weakness in hips and premature advancement of Lt LE.  Added additional hip strengthening for extensors and vector stance.   Pt requires cues for heel to toe and slower gait pattern.  AROM today 5-100 degress following manual.     Personal Factors and Comorbidities  Age;Comorbidity 2    Comorbidities  HTN,DM, OA     Examination-Activity Limitations  Bend;Dressing;Squat;Stairs;Stand    Examination-Participation Restrictions  Cleaning;Driving;Yard Work    Stability/Clinical Decision Making  Stable/Uncomplicated    Rehab Potential  Good    PT Frequency  3x / week    PT Duration  4 weeks    PT Treatment/Interventions  ADLs/Self Care Home Management;Gait training;Stair training;Functional mobility training;Therapeutic activities;Therapeutic exercise;Balance training;Patient/family education;Manual techniques    PT Next Visit Plan  Continue primary focus on ROM until WNL, manual to improve patella mobility and reduce adhesions.   Progress to strengthening when ROM increased.    PT Home Exercise Plan  heel raises, Single leg stance, LAQ, Q-set, supine hamstring stretch . 4/6: standing TKE with BTB, hamstring stretch standing, knee flexion stretch standing    Consulted and Agree with Plan of Care  Patient       Patient will benefit from skilled therapeutic intervention in order to improve the following deficits and impairments:  Abnormal gait, Decreased activity tolerance, Decreased balance, Decreased range of motion, Decreased strength, Increased edema, Increased fascial restricitons, Impaired flexibility, Pain, Decreased coordination  Visit Diagnosis: Muscle weakness (generalized)  Stiffness of right knee, not elsewhere classified  Acute pain of right knee  Gait abnormality     Problem List Patient Active Problem  List   Diagnosis Date Noted  . Cellulitis of right knee 05/29/2018  . Benign essential hypertension 05/22/2018  . Status post right knee replacement 05/22/2018  . Constipation due to opioid therapy 05/22/2018  . Adult BMI 32.0-32.9 kg/sq m 05/22/2018  . Primary osteoarthritis of knee 05/16/2018  . Aortic atherosclerosis ([place]) 10/19/2016  . History of left hip replacement 05/01/2013  . Prediabetes 03/26/2013  . Hyperlipidemia 03/26/2013  . Microproteinuria 03/26/2013  . Gout 03/26/2013  . Back pain 09/05/2012  . Prostate cancer ([place]) 04/20/2011   Teena Irani, PTA/CLT [phone]  Teena Irani 06/30/2018, 9:37 AM  [facility] [address] [place], [place], [address] Phone: [phone]   Fax:  [phone]  Name: Romy  SHAMEER MOLSTAD MRN: [id number] Date of Birth: 05-22-1950

## 2018-07-03 ENCOUNTER — Ambulatory Visit (HOSPITAL_COMMUNITY): Payer: Medicare HMO | Admitting: Physical Therapy

## 2018-07-03 ENCOUNTER — Other Ambulatory Visit: Payer: Self-pay

## 2018-07-03 DIAGNOSIS — M6281 Muscle weakness (generalized): Secondary | ICD-10-CM | POA: Diagnosis not present

## 2018-07-03 DIAGNOSIS — M25661 Stiffness of right knee, not elsewhere classified: Secondary | ICD-10-CM

## 2018-07-03 DIAGNOSIS — R269 Unspecified abnormalities of gait and mobility: Secondary | ICD-10-CM | POA: Diagnosis not present

## 2018-07-03 DIAGNOSIS — M25561 Pain in right knee: Secondary | ICD-10-CM

## 2018-07-03 NOTE — Therapy (Signed)
[facility] [address] [place], [place], [address] Phone: [phone]   Fax:  [phone]  Physical Therapy Treatment  Patient Details  Name: [name] [name] MRN: [id number] Date of Birth: [birth year] Referring Provider (PT): [name]    Encounter Date: 07/03/2018  PT End of Session - 07/03/18 0857    Visit Number  5    Number of Visits  12    Date for PT Re-Evaluation  07/23/18    Authorization - Visit Number  5    Authorization - Number of Visits  12    PT Start Time  0850    PT Stop Time  0935    PT Time Calculation (min)  45 min    Activity Tolerance  Patient tolerated treatment well    Behavior During Therapy  [facility] for tasks assessed/performed       Past Medical History:  Diagnosis Date  . Aortic atherosclerosis ([place]) 10/19/2016    Seen on chest x-ray July 2018  . Arthritis    right knee and hip   . Cancer [facility])    prostate cancer   . Chronic kidney disease    prostate cancer   . Diabetes mellitus without complication ([place])   . GERD (gastroesophageal reflux disease)   . Gout   . Gout   . Hypercholesteremia   . Hyperglycemia   . Hyperlipidemia   . Hypertension   . Sleep apnea   . Status post THR (total hip replacement) 04/20/13 05/01/2013   Right total hip 04/20/2013 Dr. Lydia Guiles implant     Past Surgical History:  Procedure Laterality Date  . COLONOSCOPY    . COLONOSCOPY N/A 10/22/2013   Procedure: COLONOSCOPY;  Surgeon: Danie Binder, MD;  [facility];  Service: Endoscopy;  Laterality: N/A;  1115  . HERNIA REPAIR     right inguinal hernia repair   . NASAL SEPTUM SURGERY    . OTHER SURGICAL HISTORY     deviated septum surgery   . ROBOT ASSISTED LAPAROSCOPIC RADICAL PROSTATECTOMY  04/20/2011   Procedure: ROBOTIC ASSISTED LAPAROSCOPIC RADICAL PROSTATECTOMY;  Surgeon: Malka So, MD;  [facility];  Service: Urology;  Laterality: N/A;  Bilateral lymph node dissection  . TOTAL HIP  ARTHROPLASTY Right 04/20/2013   Procedure: TOTAL HIP ARTHROPLASTY;  Surgeon: Carole Civil, MD;  Location: [facility];  Service: Orthopedics;  Laterality: Right;  . TOTAL KNEE ARTHROPLASTY Right 05/16/2018   Procedure: TOTAL KNEE ARTHROPLASTY;  Surgeon: Carole Civil, MD;  Location: [facility];  Service: Orthopedics;  Laterality: Right;    There were no vitals filed for this visit.  Subjective Assessment - 07/03/18 0856    Subjective  pt states he's been working on walking without his cane at home. No issues today.    Currently in Pain?  No/denies                       [facility] Adult PT Treatment/Exercise - 07/03/18 0001      Knee/Hip Exercises: Stretches   Active Hamstring Stretch  Right;3 reps    Active Hamstring Stretch Limitations  standing 12" step 30" holds    Knee: Self-Stretch to increase Flexion  Right;10 seconds;Limitations    Knee: Self-Stretch Limitations  10 reps with 12" box    Gastroc Stretch  Both;3 reps;30 seconds    Gastroc Stretch Limitations  slant board      Knee/Hip Exercises: Aerobic   Stationary  Bike  seat 15 rocking 4 minutes      Knee/Hip Exercises: Standing   Heel Raises  15 reps    Knee Flexion  Right;15 reps    Hip Abduction  Both;15 reps    Hip Extension  Both;15 reps    Rocker Board  1 minute;Limitations    Rocker Board Limitations  Rt/Lt 15 reps each with cues    SLS with Vectors  both 5X5" holds with 1 HHA    Gait Training  around gymn with cues for reduced antalgia      Knee/Hip Exercises: Seated   Long Arc Quad  Right;10 reps;Limitations      Knee/Hip Exercises: Supine   Quad Sets  Right;10 reps    Heel Slides  Right;10 reps    Knee Extension  AROM;Right    Knee Extension Limitations  5    Knee Flexion  AROM;Right    Knee Flexion Limitations  104      Manual Therapy   Manual Therapy  Myofascial release;Other (comment);Joint mobilization    Manual therapy comments  supine to Rt knee    Joint Mobilization  to improve  knee extension    Myofascial Release  to reduce adhesions periemter of knee prior to [facility]               PT Short Term Goals - 06/26/18 1254      PT SHORT TERM GOAL #1   Title  Pt Rt knee extension to be less than five degrees to allow more normalized gait pattern.     Time  2    Period  Weeks    Status  On-going    Target Date  07/07/18      PT SHORT TERM GOAL #2   Title  Pt Rt knee flexion to be to 110 to allow pt to sit for an hour in comfort for meals and socializing.     Time  2    Period  Weeks    Status  On-going      PT SHORT TERM GOAL #3   Title  Pt strength to be increased by 1/2 grade to allow pt to rise from soft low lying surface, ie couch, without difficulty.     Time  2    Period  Weeks    Status  On-going      PT SHORT TERM GOAL #4   Title  Pt to be able to single leg stance for 10 seconds to allow pt to feel confident walking on uneven ground.     Time  2    Period  Weeks    Status  On-going      PT SHORT TERM GOAL #5   Title  Pt pain in his RT knee to be no greater than a 3/10 to allow pt to sleep waking only one time throughout the night.     Time  2    Period  Weeks    Status  On-going        PT Long Term Goals - 06/26/18 1254      PT LONG TERM GOAL #1   Title  Pt Rt knee extension to be at 0 to allow a normal gait pattern.     Time  4    Period  Weeks    Status  On-going      PT LONG TERM GOAL #2   Title  Pt Rt knee flextion to be at 120 to allow pt  to be able to squat to work out in the yard.     Time  4    Period  Weeks    Status  On-going      PT LONG TERM GOAL #3   Title  Pt LE strength to be increased one grade to allow pt to ascend and descend 7 steps in a reciprocal manner.     Time  4    Period  Weeks    Status  On-going      PT LONG TERM GOAL #4   Title  Pt to be able to single leg stance on his right leg for 20 seconds to allow pt to feel comfortable walking without his cane both in and outside.     Time  4     Period  Weeks    Status  On-going      PT LONG TERM GOAL #5   Status  On-going            Plan - 07/03/18 1016    Clinical Impression Statement  continued with primary focus on improving AROM of Rt knee.  Pt with improving gait without use of AD and with increased ease getting feet into bike pedals this session.  Myofascial with knee bent resulted in increased release of adhesions the length of the scar.  Improved flexion to 105 today, no change in extension.  completed joint mobs today as well.     Personal Factors and Comorbidities  Age;Comorbidity 2    Comorbidities  HTN,DM, OA     Examination-Activity Limitations  Bend;Dressing;Squat;Stairs;Stand    Examination-Participation Restrictions  Cleaning;Driving;Yard Work    Stability/Clinical Decision Making  Stable/Uncomplicated    Rehab Potential  Good    PT Frequency  3x / week    PT Duration  4 weeks    PT Treatment/Interventions  ADLs/Self Care Home Management;Gait training;Stair training;Functional mobility training;Therapeutic activities;Therapeutic exercise;Balance training;Patient/family education;Manual techniques    PT Next Visit Plan  Continue primary focus on ROM until WNL, manual to improve patella mobility and reduce adhesions.   Progress to strengthening when ROM increased.    PT Home Exercise Plan  heel raises, Single leg stance, LAQ, Q-set, supine hamstring stretch . 4/6: standing TKE with BTB, hamstring stretch standing, knee flexion stretch standing    Consulted and Agree with Plan of Care  Patient       Patient will benefit from skilled therapeutic intervention in order to improve the following deficits and impairments:  Abnormal gait, Decreased activity tolerance, Decreased balance, Decreased range of motion, Decreased strength, Increased edema, Increased fascial restricitons, Impaired flexibility, Pain, Decreased coordination  Visit Diagnosis: Muscle weakness (generalized)  Stiffness of right knee, not  elsewhere classified  Acute pain of right knee  Gait abnormality     Problem List Patient Active Problem List   Diagnosis Date Noted  . Cellulitis of right knee 05/29/2018  . Benign essential hypertension 05/22/2018  . Status post right knee replacement 05/22/2018  . Constipation due to opioid therapy 05/22/2018  . Adult BMI 32.0-32.9 kg/sq m 05/22/2018  . Primary osteoarthritis of knee 05/16/2018  . Aortic atherosclerosis ([place]) 10/19/2016  . History of left hip replacement 05/01/2013  . Prediabetes 03/26/2013  . Hyperlipidemia 03/26/2013  . Microproteinuria 03/26/2013  . Gout 03/26/2013  . Back pain 09/05/2012  . Prostate cancer ([place]) 04/20/2011   Teena Irani, PTA/CLT [phone]  Roseanne Reno B 07/03/2018, 10:24 AM  [facility] [address]  [place], [place], [address] Phone: [phone]   Fax:  [phone]  Name: [name] [name] MRN: [id number] Date of Birth: Dec 27, 1950

## 2018-07-05 ENCOUNTER — Ambulatory Visit (HOSPITAL_COMMUNITY): Payer: Medicare HMO | Admitting: Physical Therapy

## 2018-07-05 ENCOUNTER — Other Ambulatory Visit: Payer: Self-pay

## 2018-07-05 DIAGNOSIS — M6281 Muscle weakness (generalized): Secondary | ICD-10-CM | POA: Diagnosis not present

## 2018-07-05 DIAGNOSIS — M25661 Stiffness of right knee, not elsewhere classified: Secondary | ICD-10-CM | POA: Diagnosis not present

## 2018-07-05 DIAGNOSIS — M25561 Pain in right knee: Secondary | ICD-10-CM | POA: Diagnosis not present

## 2018-07-05 DIAGNOSIS — R269 Unspecified abnormalities of gait and mobility: Secondary | ICD-10-CM

## 2018-07-05 NOTE — Therapy (Signed)
[facility] [address] [place], [place], [address] Phone: [phone]   Fax:  [phone]  Physical Therapy Treatment  Patient Details  Name: ANURAG SCARFO MRN: [id number] Date of Birth: [birth year] Referring Provider (PT): [name]    Encounter Date: 07/05/2018  PT End of Session - 07/05/18 0943    Visit Number  6    Number of Visits  12    Date for PT Re-Evaluation  07/23/18    Authorization - Visit Number  6    Authorization - Number of Visits  12    PT Start Time  6767    PT Stop Time  0940    PT Time Calculation (min)  47 min    Activity Tolerance  Patient tolerated treatment well    Behavior During Therapy  [facility] for tasks assessed/performed       Past Medical History:  Diagnosis Date  . Aortic atherosclerosis ([place]) 10/19/2016    Seen on chest x-ray July 2018  . Arthritis    right knee and hip   . Cancer [facility])    prostate cancer   . Chronic kidney disease    prostate cancer   . Diabetes mellitus without complication ([place])   . GERD (gastroesophageal reflux disease)   . Gout   . Gout   . Hypercholesteremia   . Hyperglycemia   . Hyperlipidemia   . Hypertension   . Sleep apnea   . Status post THR (total hip replacement) 04/20/13 05/01/2013   Right total hip 04/20/2013 Dr. Lydia Guiles implant     Past Surgical History:  Procedure Laterality Date  . COLONOSCOPY    . COLONOSCOPY N/A 10/22/2013   Procedure: COLONOSCOPY;  Surgeon: Danie Binder, MD;  [facility];  Service: Endoscopy;  Laterality: N/A;  1115  . HERNIA REPAIR     right inguinal hernia repair   . NASAL SEPTUM SURGERY    . OTHER SURGICAL HISTORY     deviated septum surgery   . ROBOT ASSISTED LAPAROSCOPIC RADICAL PROSTATECTOMY  04/20/2011   Procedure: ROBOTIC ASSISTED LAPAROSCOPIC RADICAL PROSTATECTOMY;  Surgeon: Malka So, MD;  Location: [facility];  Service: Urology;  Laterality: N/A;  Bilateral lymph node dissection  . TOTAL HIP  ARTHROPLASTY Right 04/20/2013   Procedure: TOTAL HIP ARTHROPLASTY;  Surgeon: Carole Civil, MD;  Location: [facility] ORS;  Service: Orthopedics;  Laterality: Right;  . TOTAL KNEE ARTHROPLASTY Right 05/16/2018   Procedure: TOTAL KNEE ARTHROPLASTY;  Surgeon: Carole Civil, MD;  Location: [facility] ORS;  Service: Orthopedics;  Laterality: Right;    There were no vitals filed for this visit.  Subjective Assessment - 07/05/18 0904    Subjective  pt states he's doing well, been going mostly without his cane at home. stiffness but no pain.                       [facility] Adult PT Treatment/Exercise - 07/05/18 1208      Knee/Hip Exercises: Stretches   Active Hamstring Stretch  Right;3 reps    Active Hamstring Stretch Limitations  standing 12" step 30" holds    Knee: Self-Stretch to increase Flexion  Right;10 seconds;Limitations    Knee: Self-Stretch Limitations  10 reps with 12" box    Gastroc Stretch  Both;3 reps;30 seconds    Gastroc Stretch Limitations  slant board      Knee/Hip Exercises: Aerobic   Stationary Bike  seat 15 rocking 4  minutes      Knee/Hip Exercises: Standing   Heel Raises  15 reps    Knee Flexion  Right;15 reps    Hip Abduction  Both;15 reps    Hip Extension  Both;15 reps    SLS with Vectors  both 5X5" holds with 1 HHA    Gait Training  around gymn with cues for reduced antalgia      Knee/Hip Exercises: Supine   Quad Sets  Right;10 reps    Heel Slides  Right;10 reps    Knee Extension  AROM;Right    Knee Extension Limitations  5    Knee Flexion  AROM;Right    Knee Flexion Limitations  104      Knee/Hip Exercises: Prone   Hamstring Curl  10 reps    Hamstring Curl Limitations  right    Prone Knee Hang  5 minutes    Prone Knee Hang Limitations  with manual to posteriolateral knee      Manual Therapy   Manual Therapy  Myofascial release;Other (comment);Joint mobilization    Manual therapy comments  supine to Rt knee    Joint Mobilization  to improve  knee extension    Myofascial Release  to reduce adhesions periemter of knee prior to [facility]               PT Short Term Goals - 06/26/18 1254      PT SHORT TERM GOAL #1   Title  Pt Rt knee extension to be less than five degrees to allow more normalized gait pattern.     Time  2    Period  Weeks    Status  On-going    Target Date  07/07/18      PT SHORT TERM GOAL #2   Title  Pt Rt knee flexion to be to 110 to allow pt to sit for an hour in comfort for meals and socializing.     Time  2    Period  Weeks    Status  On-going      PT SHORT TERM GOAL #3   Title  Pt strength to be increased by 1/2 grade to allow pt to rise from soft low lying surface, ie couch, without difficulty.     Time  2    Period  Weeks    Status  On-going      PT SHORT TERM GOAL #4   Title  Pt to be able to single leg stance for 10 seconds to allow pt to feel confident walking on uneven ground.     Time  2    Period  Weeks    Status  On-going      PT SHORT TERM GOAL #5   Title  Pt pain in his RT knee to be no greater than a 3/10 to allow pt to sleep waking only one time throughout the night.     Time  2    Period  Weeks    Status  On-going        PT Long Term Goals - 06/26/18 1254      PT LONG TERM GOAL #1   Title  Pt Rt knee extension to be at 0 to allow a normal gait pattern.     Time  4    Period  Weeks    Status  On-going      PT LONG TERM GOAL #2   Title  Pt Rt knee flextion to be at 120 to  allow pt to be able to squat to work out in the yard.     Time  4    Period  Weeks    Status  On-going      PT LONG TERM GOAL #3   Title  Pt LE strength to be increased one grade to allow pt to ascend and descend 7 steps in a reciprocal manner.     Time  4    Period  Weeks    Status  On-going      PT LONG TERM GOAL #4   Title  Pt to be able to single leg stance on his right leg for 20 seconds to allow pt to feel comfortable walking without his cane both in and outside.     Time  4     Period  Weeks    Status  On-going      PT LONG TERM GOAL #5   Status  On-going            Plan - 07/05/18 0943    Clinical Impression Statement  coninued with skilled therapy for Rt TKR.  Focus on improving ROM and manual to reduce adhesions.  Not as much scar tissue release this session as compared to last session.  Added prone knee hang and prone knee flexion.  Noted weakness with difficulty completing in a straight plane of motion.  ROM today 5-105    Personal Factors and Comorbidities  Age;Comorbidity 2    Comorbidities  HTN,DM, OA     Examination-Activity Limitations  Bend;Dressing;Squat;Stairs;Stand    Examination-Participation Restrictions  Cleaning;Driving;Yard Work    Stability/Clinical Decision Making  Stable/Uncomplicated    Rehab Potential  Good    PT Frequency  3x / week    PT Duration  4 weeks    PT Treatment/Interventions  ADLs/Self Care Home Management;Gait training;Stair training;Functional mobility training;Therapeutic activities;Therapeutic exercise;Balance training;Patient/family education;Manual techniques    PT Next Visit Plan  Continue primary focus on ROM until WNL, manual to improve patella mobility and reduce adhesions.   Progress to strengthening when ROM increased.    PT Home Exercise Plan  heel raises, Single leg stance, LAQ, Q-set, supine hamstring stretch . 4/6: standing TKE with BTB, hamstring stretch standing, knee flexion stretch standing    Consulted and Agree with Plan of Care  Patient       Patient will benefit from skilled therapeutic intervention in order to improve the following deficits and impairments:  Abnormal gait, Decreased activity tolerance, Decreased balance, Decreased range of motion, Decreased strength, Increased edema, Increased fascial restricitons, Impaired flexibility, Pain, Decreased coordination  Visit Diagnosis: Muscle weakness (generalized)  Stiffness of right knee, not elsewhere classified  Acute pain of right  knee  Gait abnormality     Problem List Patient Active Problem List   Diagnosis Date Noted  . Cellulitis of right knee 05/29/2018  . Benign essential hypertension 05/22/2018  . Status post right knee replacement 05/22/2018  . Constipation due to opioid therapy 05/22/2018  . Adult BMI 32.0-32.9 kg/sq m 05/22/2018  . Primary osteoarthritis of knee 05/16/2018  . Aortic atherosclerosis ([place]) 10/19/2016  . History of left hip replacement 05/01/2013  . Prediabetes 03/26/2013  . Hyperlipidemia 03/26/2013  . Microproteinuria 03/26/2013  . Gout 03/26/2013  . Back pain 09/05/2012  . Prostate cancer ([place]) 04/20/2011   Teena Irani, PTA/CLT [phone]  Teena Irani 07/05/2018, 12:16 PM  [facility] [address] [place], [place], [address] Phone:  [phone]   Fax:  [phone]  Name: HENCE DERRICK MRN: [id number] Date of Birth: 11/16/1950

## 2018-07-07 ENCOUNTER — Ambulatory Visit (HOSPITAL_COMMUNITY): Payer: Medicare HMO | Admitting: Physical Therapy

## 2018-07-07 ENCOUNTER — Other Ambulatory Visit: Payer: Self-pay

## 2018-07-07 DIAGNOSIS — M6281 Muscle weakness (generalized): Secondary | ICD-10-CM | POA: Diagnosis not present

## 2018-07-07 DIAGNOSIS — R269 Unspecified abnormalities of gait and mobility: Secondary | ICD-10-CM

## 2018-07-07 DIAGNOSIS — M25561 Pain in right knee: Secondary | ICD-10-CM | POA: Diagnosis not present

## 2018-07-07 DIAGNOSIS — M25661 Stiffness of right knee, not elsewhere classified: Secondary | ICD-10-CM

## 2018-07-07 NOTE — Therapy (Signed)
[facility] [place], [place], [address] Phone: [phone]   Fax:  [phone]  Physical Therapy Treatment  Patient Details  Name: [name] [name] MRN: [id number] Date of Birth: [birth year] Referring Provider (PT): [name]    Encounter Date: 07/07/2018  PT End of Session - 07/07/18 1010    Visit Number  7    Number of Visits  12    Date for PT Re-Evaluation  07/23/18    Authorization - Visit Number  7    Authorization - Number of Visits  12    PT Start Time  2330    PT Stop Time  0940    PT Time Calculation (min)  45 min    Activity Tolerance  Patient tolerated treatment well    Behavior During Therapy  [facility] for tasks assessed/performed       Past Medical History:  Diagnosis Date  . Aortic atherosclerosis ([place]) 10/19/2016    Seen on chest x-ray July 2018  . Arthritis    right knee and hip   . Cancer [facility])    prostate cancer   . Chronic kidney disease    prostate cancer   . Diabetes mellitus without complication ([place])   . GERD (gastroesophageal reflux disease)   . Gout   . Gout   . Hypercholesteremia   . Hyperglycemia   . Hyperlipidemia   . Hypertension   . Sleep apnea   . Status post THR (total hip replacement) 04/20/13 05/01/2013   Right total hip 04/20/2013 Dr. Lydia Guiles implant     Past Surgical History:  Procedure Laterality Date  . COLONOSCOPY    . COLONOSCOPY N/A 10/22/2013   Procedure: COLONOSCOPY;  Surgeon: Danie Binder, MD;  [facility];  Service: Endoscopy;  Laterality: N/A;  1115  . HERNIA REPAIR     right inguinal hernia repair   . NASAL SEPTUM SURGERY    . OTHER SURGICAL HISTORY     deviated septum surgery   . ROBOT ASSISTED LAPAROSCOPIC RADICAL PROSTATECTOMY  04/20/2011   Procedure: ROBOTIC ASSISTED LAPAROSCOPIC RADICAL PROSTATECTOMY;  Surgeon: Malka So, MD;  Location: [facility];  Service: Urology;  Laterality: N/A;  Bilateral lymph node dissection  . TOTAL HIP  ARTHROPLASTY Right 04/20/2013   Procedure: TOTAL HIP ARTHROPLASTY;  Surgeon: Carole Civil, MD;  Location: [facility] ORS;  Service: Orthopedics;  Laterality: Right;  . TOTAL KNEE ARTHROPLASTY Right 05/16/2018   Procedure: TOTAL KNEE ARTHROPLASTY;  Surgeon: Carole Civil, MD;  Location: [facility];  Service: Orthopedics;  Laterality: Right;    There were no vitals filed for this visit.  Subjective Assessment - 07/07/18 0857    Subjective  pt states he is mostly using going without the cane, only using for community ambulation and steps.  Continued stiffness but no pain.    Currently in Pain?  No/denies                       [facility] Adult PT Treatment/Exercise - 07/07/18 0001      Knee/Hip Exercises: Stretches   Active Hamstring Stretch  Right;3 reps    Active Hamstring Stretch Limitations  standing 12" step 30" holds    Knee: Self-Stretch to increase Flexion  Right;10 seconds;Limitations    Knee: Self-Stretch Limitations  10 reps with 12" box    Gastroc Stretch  Both;3 reps;30 seconds    Gastroc Stretch Limitations  slant board  Knee/Hip Exercises: Aerobic   Stationary Bike  seat 15 rocking 4 minutes      Knee/Hip Exercises: Standing   Knee Flexion  Right;15 reps    Hip Abduction  Both;15 reps    Hip Extension  Both;15 reps    SLS with Vectors  Rt 2 sets 5X5" holds with 1 HHA    Gait Training  around gymn with cues for reduced antalgia      Knee/Hip Exercises: Seated   Long Arc Quad  Right;10 reps;Limitations      Knee/Hip Exercises: Supine   Quad Sets  Right;10 reps    Heel Slides  Right;10 reps;2 sets    Knee Extension  AROM;Right    Knee Extension Limitations  5    Knee Flexion  AROM;Right    Knee Flexion Limitations  104      Knee/Hip Exercises: Prone   Hamstring Curl  10 reps    Hamstring Curl Limitations  right    Prone Knee Hang  5 minutes    Prone Knee Hang Limitations  with manual to posteriolateral knee      Manual Therapy   Manual Therapy   Myofascial release;Other (comment);Joint mobilization    Manual therapy comments  supine to Rt knee    Joint Mobilization  to improve knee extension    Myofascial Release  to reduce adhesions periemter of knee prior to [facility]               PT Short Term Goals - 06/26/18 1254      PT SHORT TERM GOAL #1   Title  Pt Rt knee extension to be less than five degrees to allow more normalized gait pattern.     Time  2    Period  Weeks    Status  On-going    Target Date  07/07/18      PT SHORT TERM GOAL #2   Title  Pt Rt knee flexion to be to 110 to allow pt to sit for an hour in comfort for meals and socializing.     Time  2    Period  Weeks    Status  On-going      PT SHORT TERM GOAL #3   Title  Pt strength to be increased by 1/2 grade to allow pt to rise from soft low lying surface, ie couch, without difficulty.     Time  2    Period  Weeks    Status  On-going      PT SHORT TERM GOAL #4   Title  Pt to be able to single leg stance for 10 seconds to allow pt to feel confident walking on uneven ground.     Time  2    Period  Weeks    Status  On-going      PT SHORT TERM GOAL #5   Title  Pt pain in his RT knee to be no greater than a 3/10 to allow pt to sleep waking only one time throughout the night.     Time  2    Period  Weeks    Status  On-going        PT Long Term Goals - 06/26/18 1254      PT LONG TERM GOAL #1   Title  Pt Rt knee extension to be at 0 to allow a normal gait pattern.     Time  4    Period  Weeks    Status  On-going  PT LONG TERM GOAL #2   Title  Pt Rt knee flextion to be at 120 to allow pt to be able to squat to work out in the yard.     Time  4    Period  Weeks    Status  On-going      PT LONG TERM GOAL #3   Title  Pt LE strength to be increased one grade to allow pt to ascend and descend 7 steps in a reciprocal manner.     Time  4    Period  Weeks    Status  On-going      PT LONG TERM GOAL #4   Title  Pt to be able to single leg  stance on his right leg for 20 seconds to allow pt to feel comfortable walking without his cane both in and outside.     Time  4    Period  Weeks    Status  On-going      PT LONG TERM GOAL #5   Status  On-going            Plan - 07/07/18 1011    Clinical Impression Statement  continued with skilled therapy for Rt TKR.  Noted improvement in ambulation today.  Tends to show increased antalgia with increased distance due to fatigue.  Flexion continues to improve with less adhesions noted with manual.  Most tightness located in proximal scar region this session and overall tightness medially and laterally.      Personal Factors and Comorbidities  Age;Comorbidity 2    Comorbidities  HTN,DM, OA     Examination-Activity Limitations  Bend;Dressing;Squat;Stairs;Stand    Examination-Participation Restrictions  Cleaning;Driving;Yard Work    Stability/Clinical Decision Making  Stable/Uncomplicated    Rehab Potential  Good    PT Frequency  3x / week    PT Duration  4 weeks    PT Treatment/Interventions  ADLs/Self Care Home Management;Gait training;Stair training;Functional mobility training;Therapeutic activities;Therapeutic exercise;Balance training;Patient/family education;Manual techniques    PT Next Visit Plan  Continue primary focus on ROM until WNL, manual to improve patella mobility and reduce adhesions.   Progress to strengthening when ROM increased.  Begin lunges and step ups next session.    PT Home Exercise Plan  heel raises, Single leg stance, LAQ, Q-set, supine hamstring stretch . 4/6: standing TKE with BTB, hamstring stretch standing, knee flexion stretch standing    Consulted and Agree with Plan of Care  Patient       Patient will benefit from skilled therapeutic intervention in order to improve the following deficits and impairments:  Abnormal gait, Decreased activity tolerance, Decreased balance, Decreased range of motion, Decreased strength, Increased edema, Increased fascial  restricitons, Impaired flexibility, Pain, Decreased coordination  Visit Diagnosis: Muscle weakness (generalized)  Stiffness of right knee, not elsewhere classified  Acute pain of right knee  Gait abnormality     Problem List Patient Active Problem List   Diagnosis Date Noted  . Cellulitis of right knee 05/29/2018  . Benign essential hypertension 05/22/2018  . Status post right knee replacement 05/22/2018  . Constipation due to opioid therapy 05/22/2018  . Adult BMI 32.0-32.9 kg/sq m 05/22/2018  . Primary osteoarthritis of knee 05/16/2018  . Aortic atherosclerosis ([place]) 10/19/2016  . History of left hip replacement 05/01/2013  . Prediabetes 03/26/2013  . Hyperlipidemia 03/26/2013  . Microproteinuria 03/26/2013  . Gout 03/26/2013  . Back pain 09/05/2012  . Prostate cancer ([place]) 04/20/2011   [name] [name], PTA/CLT  [phone]  [name] [name] 07/07/2018, 10:34 AM  [facility] [place], [place], [address] Phone: [phone]   Fax:  [phone]  Name: [name] [name] MRN: [id number] Date of Birth: 1950/12/24

## 2018-07-10 ENCOUNTER — Ambulatory Visit (HOSPITAL_COMMUNITY): Payer: Medicare HMO | Admitting: Physical Therapy

## 2018-07-10 ENCOUNTER — Other Ambulatory Visit: Payer: Self-pay

## 2018-07-10 DIAGNOSIS — M6281 Muscle weakness (generalized): Secondary | ICD-10-CM

## 2018-07-10 DIAGNOSIS — R269 Unspecified abnormalities of gait and mobility: Secondary | ICD-10-CM

## 2018-07-10 DIAGNOSIS — M25661 Stiffness of right knee, not elsewhere classified: Secondary | ICD-10-CM

## 2018-07-10 DIAGNOSIS — M25561 Pain in right knee: Secondary | ICD-10-CM | POA: Diagnosis not present

## 2018-07-10 NOTE — Therapy (Signed)
[facility] [address] [place], [place], [address] Phone: [phone]   Fax:  [phone]  Physical Therapy Treatment  Patient Details  Name: [name] [name] MRN: [id number] Date of Birth: [birth year] Referring Provider (PT): [name]    Encounter Date: 07/10/2018  PT End of Session - 07/10/18 0945    Visit Number  8    Number of Visits  12    Date for PT Re-Evaluation  07/23/18    Authorization - Visit Number  8    Authorization - Number of Visits  12    PT Start Time  0175    PT Stop Time  0944    PT Time Calculation (min)  46 min    Activity Tolerance  Patient tolerated treatment well    Behavior During Therapy  [facility] for tasks assessed/performed       Past Medical History:  Diagnosis Date  . Aortic atherosclerosis ([place]) 10/19/2016    Seen on chest x-ray July 2018  . Arthritis    right knee and hip   . Cancer [facility])    prostate cancer   . Chronic kidney disease    prostate cancer   . Diabetes mellitus without complication ([place])   . GERD (gastroesophageal reflux disease)   . Gout   . Gout   . Hypercholesteremia   . Hyperglycemia   . Hyperlipidemia   . Hypertension   . Sleep apnea   . Status post THR (total hip replacement) 04/20/13 05/01/2013   Right total hip 04/20/2013 Dr. Lydia Guiles implant     Past Surgical History:  Procedure Laterality Date  . COLONOSCOPY    . COLONOSCOPY N/A 10/22/2013   Procedure: COLONOSCOPY;  Surgeon: Danie Binder, MD;  [facility];  Service: Endoscopy;  Laterality: N/A;  1115  . HERNIA REPAIR     right inguinal hernia repair   . NASAL SEPTUM SURGERY    . OTHER SURGICAL HISTORY     deviated septum surgery   . ROBOT ASSISTED LAPAROSCOPIC RADICAL PROSTATECTOMY  04/20/2011   Procedure: ROBOTIC ASSISTED LAPAROSCOPIC RADICAL PROSTATECTOMY;  Surgeon: Malka So, MD;  Location: [facility];  Service: Urology;  Laterality: N/A;  Bilateral lymph node dissection  . TOTAL HIP  ARTHROPLASTY Right 04/20/2013   Procedure: TOTAL HIP ARTHROPLASTY;  Surgeon: Carole Civil, MD;  Location: [facility] OR[facility];  Service: Orthopedics;  Laterality: Right;  . TOTAL KNEE ARTHROPLASTY Right 05/16/2018   Procedure: TOTAL KNEE ARTHROPLASTY;  Surgeon: Carole Civil, MD;  Location: [facility] OR[facility];  Service: Orthopedics;  Laterality: Right;    There were no vitals filed for this visit.  Subjective Assessment - 07/10/18 0903    Subjective  pt states he exercised/stretched alot over the weekend.     Currently in Pain?  No/denies                       [facility] Adult PT Treatment/Exercise - 07/10/18 0001      Knee/Hip Exercises: Stretches   Active Hamstring Stretch  Right;3 reps    Active Hamstring Stretch Limitations  standing 12" step 30" holds    Knee: Self-Stretch to increase Flexion  Right;10 seconds;Limitations    Knee: Self-Stretch Limitations  10 reps with 12" box    Gastroc Stretch  Both;3 reps;30 seconds    Gastroc Stretch Limitations  slant board      Knee/Hip Exercises: Aerobic   Stationary Bike  seat 15 rocking and  backward revolutions 4 minutes      Knee/Hip Exercises: Standing   Knee Flexion  Right;15 reps    Forward Lunges  Both;15 reps    Forward Lunges Limitations  onto 4" step no UE assist    Lateral Step Up  Right;10 reps;Step Height: 4";Hand Hold: 1    Lateral Step Up Limitations  cues for slowly, controlled motion    Forward Step Up  Right;10 reps;Step Height: 4";Hand Hold: 0    Step Down  Right;10 reps;Hand Hold: 1;Step Height: 4"    SLS with Vectors  Rt 2 sets 5X5" holds with 1 HHA    Gait Training  around gymn with cues for reduced antalgia      Knee/Hip Exercises: Supine   Quad Sets  Right;10 reps    Short Arc Quad Sets  Right;15 reps    Heel Slides  Right;10 reps;2 sets    Knee Extension  AROM;Right    Knee Extension Limitations  5    Knee Flexion  AROM;Right    Knee Flexion Limitations  107      Manual Therapy   Manual Therapy   Myofascial release;Other (comment);Joint mobilization    Manual therapy comments  supine to Rt knee    Joint Mobilization  to improve knee extension and flexion    Myofascial Release  to reduce adhesions periemter of knee prior to [facility]               PT Short Term Goals - 06/26/18 1254      PT SHORT TERM GOAL #1   Title  Pt Rt knee extension to be less than five degrees to allow more normalized gait pattern.     Time  2    Period  Weeks    Status  On-going    Target Date  07/07/18      PT SHORT TERM GOAL #2   Title  Pt Rt knee flexion to be to 110 to allow pt to sit for an hour in comfort for meals and socializing.     Time  2    Period  Weeks    Status  On-going      PT SHORT TERM GOAL #3   Title  Pt strength to be increased by 1/2 grade to allow pt to rise from soft low lying surface, ie couch, without difficulty.     Time  2    Period  Weeks    Status  On-going      PT SHORT TERM GOAL #4   Title  Pt to be able to single leg stance for 10 seconds to allow pt to feel confident walking on uneven ground.     Time  2    Period  Weeks    Status  On-going      PT SHORT TERM GOAL #5   Title  Pt pain in his RT knee to be no greater than a 3/10 to allow pt to sleep waking only one time throughout the night.     Time  2    Period  Weeks    Status  On-going        PT Long Term Goals - 06/26/18 1254      PT LONG TERM GOAL #1   Title  Pt Rt knee extension to be at 0 to allow a normal gait pattern.     Time  4    Period  Weeks    Status  On-going  PT LONG TERM GOAL #2   Title  Pt Rt knee flextion to be at 120 to allow pt to be able to squat to work out in the yard.     Time  4    Period  Weeks    Status  On-going      PT LONG TERM GOAL #3   Title  Pt LE strength to be increased one grade to allow pt to ascend and descend 7 steps in a reciprocal manner.     Time  4    Period  Weeks    Status  On-going      PT LONG TERM GOAL #4   Title  Pt to be able to  single leg stance on his right leg for 20 seconds to allow pt to feel comfortable walking without his cane both in and outside.     Time  4    Period  Weeks    Status  On-going      PT LONG TERM GOAL #5   Status  On-going            Plan - 07/10/18 1249    Clinical Impression Statement  pt returns stating he really worked on his ROM alot over the weekend.  Continued with primary focus on ROM, however began stability and functional strengthening exercises with good form and minimal cues.  Pt tends to complete all activities better when slows pace and increases control of exercises.  Much improved gait this session with less antalgia noted until 2nd lap due to fatigue.  Pt able to make full backward revolution on bike today.  Completed improved AROM to 107 degrees flexion today, no change in extension at lacking 5 degrees.      Personal Factors and Comorbidities  Age;Comorbidity 2    Comorbidities  HTN,DM, OA     Examination-Activity Limitations  Bend;Dressing;Squat;Stairs;Stand    Examination-Participation Restrictions  Cleaning;Driving;Yard Work    Stability/Clinical Decision Making  Stable/Uncomplicated    Rehab Potential  Good    PT Frequency  3x / week    PT Duration  4 weeks    PT Treatment/Interventions  ADLs/Self Care Home Management;Gait training;Stair training;Functional mobility training;Therapeutic activities;Therapeutic exercise;Balance training;Patient/family education;Manual techniques    PT Next Visit Plan  Continue primary focus on ROM until WNL, manual to improve patella mobility and reduce adhesions.   Progress to strengthening when ROM increased.     PT Home Exercise Plan  heel raises, Single leg stance, LAQ, Q-set, supine hamstring stretch . 4/6: standing TKE with BTB, hamstring stretch standing, knee flexion stretch standing    Consulted and Agree with Plan of Care  Patient       Patient will benefit from skilled therapeutic intervention in order to improve the  following deficits and impairments:  Abnormal gait, Decreased activity tolerance, Decreased balance, Decreased range of motion, Decreased strength, Increased edema, Increased fascial restricitons, Impaired flexibility, Pain, Decreased coordination  Visit Diagnosis: Muscle weakness (generalized)  Stiffness of right knee, not elsewhere classified  Acute pain of right knee  Gait abnormality     Problem List Patient Active Problem List   Diagnosis Date Noted  . Cellulitis of right knee 05/29/2018  . Benign essential hypertension 05/22/2018  . Status post right knee replacement 05/22/2018  . Constipation due to opioid therapy 05/22/2018  . Adult BMI 32.0-32.9 kg/sq m 05/22/2018  . Primary osteoarthritis of knee 05/16/2018  . Aortic atherosclerosis ([place]) 10/19/2016  . History of left hip replacement  05/01/2013  . Prediabetes 03/26/2013  . Hyperlipidemia 03/26/2013  . Microproteinuria 03/26/2013  . Gout 03/26/2013  . Back pain 09/05/2012  . Prostate cancer ([place]) 04/20/2011   Teena Irani, PTA/CLT [phone]  Teena Irani 07/10/2018, 12:53 PM  [place], [place], [address] Phone: [phone]   Fax:  [phone]  Name: [name] [name] MRN: [id number] Date of Birth: 1950-06-18

## 2018-07-12 ENCOUNTER — Ambulatory Visit (HOSPITAL_COMMUNITY): Payer: Medicare HMO | Admitting: Physical Therapy

## 2018-07-12 ENCOUNTER — Other Ambulatory Visit: Payer: Self-pay

## 2018-07-12 DIAGNOSIS — M25661 Stiffness of right knee, not elsewhere classified: Secondary | ICD-10-CM

## 2018-07-12 DIAGNOSIS — M25561 Pain in right knee: Secondary | ICD-10-CM | POA: Diagnosis not present

## 2018-07-12 DIAGNOSIS — M6281 Muscle weakness (generalized): Secondary | ICD-10-CM

## 2018-07-12 DIAGNOSIS — R269 Unspecified abnormalities of gait and mobility: Secondary | ICD-10-CM

## 2018-07-12 NOTE — Therapy (Signed)
[facility] [address] [place], [place], [address] Phone: [phone]   Fax:  [phone]  Physical Therapy Treatment  Patient Details  Name: [name] [name] MRN: [id number] Date of Birth: 1950/04/16 Referring Provider (PT): [name]    Encounter Date: 07/12/2018  PT End of Session - 07/12/18 0950    Visit Number  9    Number of Visits  12    Date for PT Re-Evaluation  07/23/18    Authorization - Visit Number  9    Authorization - Number of Visits  12    PT Start Time  0904    PT Stop Time  0947    PT Time Calculation (min)  43 min    Activity Tolerance  Patient tolerated treatment well    Behavior During Therapy  [facility] for tasks assessed/performed       Past Medical History:  Diagnosis Date  . Aortic atherosclerosis ([place]) 10/19/2016    Seen on chest x-ray July 2018  . Arthritis    right knee and hip   . Cancer [facility])    prostate cancer   . Chronic kidney disease    prostate cancer   . Diabetes mellitus without complication ([place])   . GERD (gastroesophageal reflux disease)   . Gout   . Gout   . Hypercholesteremia   . Hyperglycemia   . Hyperlipidemia   . Hypertension   . Sleep apnea   . Status post THR (total hip replacement) 04/20/13 05/01/2013   Right total hip 04/20/2013 Dr. Lydia Guiles implant     Past Surgical History:  Procedure Laterality Date  . COLONOSCOPY    . COLONOSCOPY N/A 10/22/2013   Procedure: COLONOSCOPY;  Surgeon: Danie Binder, MD;  [facility];  Service: Endoscopy;  Laterality: N/A;  1115  . HERNIA REPAIR     right inguinal hernia repair   . NASAL SEPTUM SURGERY    . OTHER SURGICAL HISTORY     deviated septum surgery   . ROBOT ASSISTED LAPAROSCOPIC RADICAL PROSTATECTOMY  04/20/2011   Procedure: ROBOTIC ASSISTED LAPAROSCOPIC RADICAL PROSTATECTOMY;  Surgeon: Malka So, MD;  Location: [facility];  Service: Urology;  Laterality: N/A;  Bilateral lymph node dissection  . TOTAL HIP  ARTHROPLASTY Right 04/20/2013   Procedure: TOTAL HIP ARTHROPLASTY;  Surgeon: Carole Civil, MD;  Location: [facility];  Service: Orthopedics;  Laterality: Right;  . TOTAL KNEE ARTHROPLASTY Right 05/16/2018   Procedure: TOTAL KNEE ARTHROPLASTY;  Surgeon: Carole Civil, MD;  Location: [facility];  Service: Orthopedics;  Laterality: Right;    There were no vitals filed for this visit.                    [facility] Adult PT Treatment/Exercise - 07/12/18 0001      Knee/Hip Exercises: Stretches   Active Hamstring Stretch  Right;3 reps    Active Hamstring Stretch Limitations  standing 12" step 30" holds    Knee: Self-Stretch to increase Flexion  Right;10 seconds;Limitations    Knee: Self-Stretch Limitations  10 reps with 12" box    Gastroc Stretch  Both;3 reps;30 seconds    Gastroc Stretch Limitations  slant board      Knee/Hip Exercises: Aerobic   Stationary Bike  seat 15 rocking and backward revolutions 4 minutes      Knee/Hip Exercises: Standing   Knee Flexion  Right;10 reps;2 sets;Limitations    Knee Flexion Limitations  2nd set TKF using  6" step    Forward Lunges  Both;15 reps    Forward Lunges Limitations  onto 4" step no UE assist    Lateral Step Up  Right;10 reps;Step Height: 4";Hand Hold: 1    Lateral Step Up Limitations  cues for slowly, controlled motion    Forward Step Up  Right;10 reps;Hand Hold: 0;Step Height: 6"    Step Down  Right;10 reps;Hand Hold: 1;Step Height: 4"    Stairs  6" reciprocally without HR using SPC 3RT    Gait Training  around gymn with cues for reduced antalgia      Knee/Hip Exercises: Supine   Quad Sets  Right;10 reps    Short Arc Quad Sets  Right;15 reps    Heel Slides  Right;10 reps;2 sets    Knee Extension  AROM;Right    Knee Extension Limitations  4    Knee Flexion  AROM;Right    Knee Flexion Limitations  106      Manual Therapy   Manual Therapy  Myofascial release;Other (comment);Joint mobilization    Manual therapy comments   supine to Rt knee    Joint Mobilization  to improve knee extension and flexion    Myofascial Release  to reduce adhesions periemter of knee prior to [facility]               PT Short Term Goals - 06/26/18 1254      PT SHORT TERM GOAL #1   Title  Pt Rt knee extension to be less than five degrees to allow more normalized gait pattern.     Time  2    Period  Weeks    Status  On-going    Target Date  07/07/18      PT SHORT TERM GOAL #2   Title  Pt Rt knee flexion to be to 110 to allow pt to sit for an hour in comfort for meals and socializing.     Time  2    Period  Weeks    Status  On-going      PT SHORT TERM GOAL #3   Title  Pt strength to be increased by 1/2 grade to allow pt to rise from soft low lying surface, ie couch, without difficulty.     Time  2    Period  Weeks    Status  On-going      PT SHORT TERM GOAL #4   Title  Pt to be able to single leg stance for 10 seconds to allow pt to feel confident walking on uneven ground.     Time  2    Period  Weeks    Status  On-going      PT SHORT TERM GOAL #5   Title  Pt pain in his RT knee to be no greater than a 3/10 to allow pt to sleep waking only one time throughout the night.     Time  2    Period  Weeks    Status  On-going        PT Long Term Goals - 06/26/18 1254      PT LONG TERM GOAL #1   Title  Pt Rt knee extension to be at 0 to allow a normal gait pattern.     Time  4    Period  Weeks    Status  On-going      PT LONG TERM GOAL #2   Title  Pt Rt knee flextion to be at 120  to allow pt to be able to squat to work out in the yard.     Time  4    Period  Weeks    Status  On-going      PT LONG TERM GOAL #3   Title  Pt LE strength to be increased one grade to allow pt to ascend and descend 7 steps in a reciprocal manner.     Time  4    Period  Weeks    Status  On-going      PT LONG TERM GOAL #4   Title  Pt to be able to single leg stance on his right leg for 20 seconds to allow pt to feel comfortable  walking without his cane both in and outside.     Time  4    Period  Weeks    Status  On-going      PT LONG TERM GOAL #5   Status  On-going            Plan - 07/12/18 1004    Clinical Impression Statement  contiued with focus on iimproving AROM with additional functional strengthening/tasks added. Pt able to complete steps reciprocally with increased time/SPC and increased time.  Pt with most diffiuclty with descending steps due to eccentric weakness and lacking full ROM Rt knee.     Personal Factors and Comorbidities  Age;Comorbidity 2    Comorbidities  HTN,DM, OA     Examination-Activity Limitations  Bend;Dressing;Squat;Stairs;Stand    Examination-Participation Restrictions  Cleaning;Driving;Yard Work    Stability/Clinical Decision Making  Stable/Uncomplicated    Rehab Potential  Good    PT Frequency  3x / week    PT Duration  4 weeks    PT Treatment/Interventions  ADLs/Self Care Home Management;Gait training;Stair training;Functional mobility training;Therapeutic activities;Therapeutic exercise;Balance training;Patient/family education;Manual techniques    PT Next Visit Plan  Continue primary focus on ROM until WNL, manual to improve patella mobility and reduce adhesions.   Next session add contract relax in prone and quad sets.  complete 10th visit PN.    PT Home Exercise Plan  heel raises, Single leg stance, LAQ, Q-set, supine hamstring stretch . 4/6: standing TKE with BTB, hamstring stretch standing, knee flexion stretch standing    Consulted and Agree with Plan of Care  Patient       Patient will benefit from skilled therapeutic intervention in order to improve the following deficits and impairments:  Abnormal gait, Decreased activity tolerance, Decreased balance, Decreased range of motion, Decreased strength, Increased edema, Increased fascial restricitons, Impaired flexibility, Pain, Decreased coordination  Visit Diagnosis: Muscle weakness (generalized)  Stiffness of  right knee, not elsewhere classified  Acute pain of right knee  Gait abnormality     Problem List Patient Active Problem List   Diagnosis Date Noted  . Cellulitis of right knee 05/29/2018  . Benign essential hypertension 05/22/2018  . Status post right knee replacement 05/22/2018  . Constipation due to opioid therapy 05/22/2018  . Adult BMI 32.0-32.9 kg/sq m 05/22/2018  . Primary osteoarthritis of knee 05/16/2018  . Aortic atherosclerosis ([place]) 10/19/2016  . History of left hip replacement 05/01/2013  . Prediabetes 03/26/2013  . Hyperlipidemia 03/26/2013  . Microproteinuria 03/26/2013  . Gout 03/26/2013  . Back pain 09/05/2012  . Prostate cancer ([place]) 04/20/2011   Teena Irani, PTA/CLT [phone]  Teena Irani 07/12/2018, 10:08 AM  [facility] [place], [place], [address] Phone: [phone]   Fax:  [phone]  Name: [name] [name] MRN: [id number] Date of Birth: [birth year]

## 2018-07-13 DIAGNOSIS — D649 Anemia, unspecified: Secondary | ICD-10-CM | POA: Diagnosis not present

## 2018-07-13 DIAGNOSIS — N289 Disorder of kidney and ureter, unspecified: Secondary | ICD-10-CM | POA: Diagnosis not present

## 2018-07-14 ENCOUNTER — Other Ambulatory Visit: Payer: Self-pay

## 2018-07-14 ENCOUNTER — Ambulatory Visit (HOSPITAL_COMMUNITY): Payer: Medicare HMO | Admitting: Physical Therapy

## 2018-07-14 DIAGNOSIS — R269 Unspecified abnormalities of gait and mobility: Secondary | ICD-10-CM | POA: Diagnosis not present

## 2018-07-14 DIAGNOSIS — M25661 Stiffness of right knee, not elsewhere classified: Secondary | ICD-10-CM

## 2018-07-14 DIAGNOSIS — M6281 Muscle weakness (generalized): Secondary | ICD-10-CM | POA: Diagnosis not present

## 2018-07-14 DIAGNOSIS — M25561 Pain in right knee: Secondary | ICD-10-CM | POA: Diagnosis not present

## 2018-07-14 LAB — BASIC METABOLIC PANEL
BUN/Creatinine Ratio: 10 (ref 10–24)
BUN: 11 mg/dL (ref 8–27)
CHLORIDE: 95 mmol/L — AB (ref 96–106)
CO2: 24 mmol/L (ref 20–29)
Calcium: 9.7 mg/dL (ref 8.6–10.2)
Creatinine, Ser: 1.09 mg/dL (ref 0.76–1.27)
GFR calc Af Amer: 79 mL/min/{1.73_m2} (ref >59)
GFR calc non Af Amer: 68 mL/min/{1.73_m2} (ref >59)
GLUCOSE: 117 mg/dL — AB (ref 65–99)
Potassium: 4.4 mmol/L (ref 3.5–5.2)
Sodium: 137 mmol/L (ref 134–144)

## 2018-07-14 LAB — CBC WITH DIFFERENTIAL/PLATELET
BASOS ABS: 0.1 10*3/uL (ref 0.0–0.2)
Basos: 1 %
EOS (ABSOLUTE): 0.1 10*3/uL (ref 0.0–0.4)
Eos: 1 %
Hematocrit: 44.1 % (ref 37.5–51.0)
Hemoglobin: 14.7 g/dL (ref 13.0–17.7)
Immature Grans (Abs): 0 10*3/uL (ref 0.0–0.1)
Immature Granulocytes: 0 %
Lymphocytes Absolute: 1.3 10*3/uL (ref 0.7–3.1)
Lymphs: 19 %
MCH: 28.4 pg (ref 26.6–33.0)
MCHC: 33.3 g/dL (ref 31.5–35.7)
MCV: 85 fL (ref 79–97)
Monocytes Absolute: 0.6 10*3/uL (ref 0.1–0.9)
Monocytes: 9 %
Neutrophils Absolute: 4.7 10*3/uL (ref 1.4–7.0)
Neutrophils: 70 %
Platelets: 307 10*3/uL (ref 150–450)
RBC: 5.17 x10E6/uL (ref 4.14–5.80)
RDW: 13.6 % (ref 11.6–15.4)
WBC: 6.7 10*3/uL (ref 3.4–10.8)

## 2018-07-14 NOTE — Therapy (Signed)
[place], [place], [address] Phone: [phone]   Fax:  [phone]  Physical Therapy Treatment Progress Note Reporting Period 06/23/2018  to 07/14/2018  See note below for Objective Data and Assessment of Progress/Goals.    # OF FEET WALKED:  500+ without AD ROM:  Flexion:  106 AROM, 110 AAROM            Extension:  Lacking 4 AROM   Patient Details  Name: [name] [name] MRN: [id number] Date of Birth: June 21, 1950 Referring Provider (PT): [name]    Encounter Date: 07/14/2018  PT End of Session - 07/14/18 1206    Visit Number  10    Number of Visits  12    Date for PT Re-Evaluation  07/23/18    Authorization - Visit Number  10    Authorization - Number of Visits  12    PT Start Time  5188    PT Stop Time  0938    PT Time Calculation (min)  43 min    Activity Tolerance  Patient tolerated treatment well    Behavior During Therapy  [facility] for tasks assessed/performed       Past Medical History:  Diagnosis Date  . Aortic atherosclerosis ([place]) 10/19/2016    Seen on chest x-ray July 2018  . Arthritis    right knee and hip   . Cancer [facility])    prostate cancer   . Chronic kidney disease    prostate cancer   . Diabetes mellitus without complication ([place])   . GERD (gastroesophageal reflux disease)   . Gout   . Gout   . Hypercholesteremia   . Hyperglycemia   . Hyperlipidemia   . Hypertension   . Sleep apnea   . Status post THR (total hip replacement) 04/20/13 05/01/2013   Right total hip 04/20/2013 Dr. Lydia Guiles implant     Past Surgical History:  Procedure Laterality Date  . COLONOSCOPY    . COLONOSCOPY N/A 10/22/2013   Procedure: COLONOSCOPY;  Surgeon: Danie Binder, MD;  Location[facility];  Service: Endoscopy;  Laterality: N/A;  1115  . HERNIA REPAIR     right inguinal hernia repair   . NASAL SEPTUM SURGERY    . OTHER SURGICAL HISTORY     deviated septum surgery   . ROBOT  ASSISTED LAPAROSCOPIC RADICAL PROSTATECTOMY  04/20/2011   Procedure: ROBOTIC ASSISTED LAPAROSCOPIC RADICAL PROSTATECTOMY;  Surgeon: Malka So, MD;  Location: [facility] ORS;  Service: Urology;  Laterality: N/A;  Bilateral lymph node dissection  . TOTAL HIP ARTHROPLASTY Right 04/20/2013   Procedure: TOTAL HIP ARTHROPLASTY;  Surgeon: Carole Civil, MD;  Location: [facility] ORS;  Service: Orthopedics;  Laterality: Right;  . TOTAL KNEE ARTHROPLASTY Right 05/16/2018   Procedure: TOTAL KNEE ARTHROPLASTY;  Surgeon: Carole Civil, MD;  Location: [facility] ORS;  Service: Orthopedics;  Laterality: Right;    There were no vitals filed for this visit.  Subjective Assessment - 07/14/18 1155    Subjective  pt reports no issues currently, getting looser.  Scheduled to return to work on 5/16 and returns to MD 5/18    Currently in Pain?  No/denies         [facility] PT Assessment - 07/14/18 1202      Assessment   Medical Diagnosis  Rt TKR    Referring Provider (PT)  [name]     Onset Date/Surgical Date  05/16/18    Next MD  Visit  06/25/2008    Prior Therapy  SNF, HH      Precautions   Precautions  None      Restrictions   Weight Bearing Restrictions  No      Prior Function   Level of Independence  Independent    Vocation  Retired    Photographer, walks       Cognition   Overall Cognitive Status  Within Functional Limits for tasks assessed      Observation/Other Assessments   Focus on Therapeutic Outcomes (FOTO)   assess next session      Functional Tests   Functional tests  Single leg stance;Sit to Stand      Single Leg Stance   Comments  LT: 30";  RT: 15"    on 4/3  Lt: 30", Rt: 0"     Sit to Stand   Comments  --      AROM   Right Knee Extension  4   was lacking 10 degrees on 4/3   Right Knee Flexion  106   was 95 degrees on 4/3     Strength   Right Hip Flexion  --    Right Hip Extension  --    Right Hip ABduction  --    Left Hip Extension  --    Left Hip ABduction  --     Right Knee Flexion  --    Right Knee Extension  --    Right Ankle Dorsiflexion  --      Ambulation/Gait   Ambulation Distance (Feet)  565 Feet    Assistive device  None    Gait Pattern  Decreased hip/knee flexion - right;Decreased dorsiflexion - right    Gait Comments  3 minute test                    [facility] Adult PT Treatment/Exercise - 07/14/18 1156      Knee/Hip Exercises: Stretches   Active Hamstring Stretch  Right;3 reps    Active Hamstring Stretch Limitations  standing 12" step 30" holds    Knee: Self-Stretch to increase Flexion  Right;10 seconds;Limitations    Knee: Self-Stretch Limitations  10 reps with 12" box    Gastroc Stretch  Both;3 reps;30 seconds    Gastroc Stretch Limitations  slant board      Knee/Hip Exercises: Aerobic   Stationary Bike  seat 15 full rev fwd/backward revolutions 4 minutes      Knee/Hip Exercises: Standing   Knee Flexion  Right;10 reps;2 sets;Limitations    Knee Flexion Limitations  2nd set TKF using 6" step    Forward Lunges  Both;15 reps    Forward Lunges Limitations  onto 4" step no UE assist    Lateral Step Up  Right;10 reps;Step Height: 4";Hand Hold: 1    Lateral Step Up Limitations  cues for slowly, controlled motion    Forward Step Up  Right;10 reps;Hand Hold: 0;Step Height: 6"    Step Down  Right;10 reps;Hand Hold: 1;Step Height: 4"    Stairs  6" reciprocally without HR using SPC 5RT    Gait Training  around gymn with cues for reduced antalgia      Knee/Hip Exercises: Supine   Quad Sets  Right;10 reps    Short Arc Quad Sets  Right;15 reps    Heel Slides  Right;10 reps;2 sets    Knee Extension  AROM;Right    Knee Extension Limitations  4    Knee  Flexion  AROM;Right    Knee Flexion Limitations  106    Other Supine Knee/Hip Exercises  AAROM flexion to 110      Knee/Hip Exercises: Prone   Hamstring Curl  10 reps    Contract/Relax to Increase Flexion  3 reps    Other Prone Exercises  quad set 10 reps      Manual  Therapy   Manual Therapy  Myofascial release;Other (comment);Joint mobilization    Manual therapy comments  supine to Rt knee    Joint Mobilization  to improve knee extension and flexion    Myofascial Release  to reduce adhesions periemter of knee prior to [facility]    Other Manual Therapy  contract relax to increase flexion, PROM for extension in supine               PT Short Term Goals - 07/14/18 1159      PT SHORT TERM GOAL #1   Title  Pt Rt knee extension to be less than five degrees to allow more normalized gait pattern.     Time  2    Period  Weeks    Status  Achieved    Target Date  07/07/18      PT SHORT TERM GOAL #2   Title  Pt Rt knee flexion to be to 110 to allow pt to sit for an hour in comfort for meals and socializing.     Baseline  AROM 106, AAROM 110    Time  2    Period  Weeks    Status  Partially Met      PT SHORT TERM GOAL #3   Title  Pt strength to be increased by 1/2 grade to allow pt to rise from soft low lying surface, ie couch, without difficulty.     Time  2    Period  Weeks    Status  On-going      PT SHORT TERM GOAL #4   Title  Pt to be able to single leg stance for 10 seconds to allow pt to feel confident walking on uneven ground.     Time  2    Period  Weeks    Status  Achieved      PT SHORT TERM GOAL #5   Title  Pt pain in his RT knee to be no greater than a 3/10 to allow pt to sleep waking only one time throughout the night.     Time  2    Period  Weeks    Status  Achieved        PT Long Term Goals - 07/14/18 1201      PT LONG TERM GOAL #1   Title  Pt Rt knee extension to be at 0 to allow a normal gait pattern.     Time  4    Period  Weeks    Status  On-going      PT LONG TERM GOAL #2   Title  Pt Rt knee flextion to be at 120 to allow pt to be able to squat to work out in the yard.     Time  4    Period  Weeks    Status  On-going      PT LONG TERM GOAL #3   Title  Pt LE strength to be increased one grade to allow pt to  ascend and descend 7 steps in a reciprocal manner.     Time  4  Period  Weeks    Status  On-going      PT LONG TERM GOAL #4   Title  Pt to be able to single leg stance on his right leg for 20 seconds to allow pt to feel comfortable walking without his cane both in and outside.     Time  4    Period  Weeks    Status  On-going            Plan - 07/14/18 1207    Clinical Impression Statement  Pt is overall progressing well S/P Rt TKA.  ROM has increased 4-106 (was 10--95) and is now able to make full revolutions on bike.  Pt has met 3/5 STGs and is progressing towards LTG's.  Pt has no pain, only stiffness and is not limiting sleep patterns at this time.  Pt is community ambulating using SPC and is able to ambulate indoors without AD and minimal antalgia.      Personal Factors and Comorbidities  Age;Comorbidity 2    Comorbidities  HTN,DM, OA     Examination-Activity Limitations  Bend;Dressing;Squat;Stairs;Stand    Examination-Participation Restrictions  Cleaning;Driving;Yard Work    Stability/Clinical Decision Making  Stable/Uncomplicated    Rehab Potential  Good    PT Frequency  3x / week    PT Duration  4 weeks    PT Treatment/Interventions  ADLs/Self Care Home Management;Gait training;Stair training;Functional mobility training;Therapeutic activities;Therapeutic exercise;Balance training;Patient/family education;Manual techniques    PT Next Visit Plan  Continue primary focus on ROM until WNL, manual to improve patella mobility and reduce adhesions.   PRogress towards goals.      PT Home Exercise Plan  heel raises, Single leg stance, LAQ, Q-set, supine hamstring stretch . 4/6: standing TKE with BTB, hamstring stretch standing, knee flexion stretch standing    Consulted and Agree with Plan of Care  Patient       Patient will benefit from skilled therapeutic intervention in order to improve the following deficits and impairments:  Abnormal gait, Decreased activity tolerance,  Decreased balance, Decreased range of motion, Decreased strength, Increased edema, Increased fascial restricitons, Impaired flexibility, Pain, Decreased coordination  Visit Diagnosis: Muscle weakness (generalized)  Acute pain of right knee  Stiffness of right knee, not elsewhere classified  Gait abnormality     Problem List Patient Active Problem List   Diagnosis Date Noted  . Cellulitis of right knee 05/29/2018  . Benign essential hypertension 05/22/2018  . Status post right knee replacement 05/22/2018  . Constipation due to opioid therapy 05/22/2018  . Adult BMI 32.0-32.9 kg/sq m 05/22/2018  . Primary osteoarthritis of knee 05/16/2018  . Aortic atherosclerosis ([place]) 10/19/2016  . History of left hip replacement 05/01/2013  . Prediabetes 03/26/2013  . Hyperlipidemia 03/26/2013  . Microproteinuria 03/26/2013  . Gout 03/26/2013  . Back pain 09/05/2012  . Prostate cancer ([place]) 04/20/2011   Teena Irani, PTA/CLT [phone]  Teena Irani 07/14/2018, 12:10 PM  [place], [place], [address] Phone: [phone]   Fax:  [phone]  Name: [name] [name] MRN: [id number] Date of Birth: [birth year]

## 2018-07-14 NOTE — Therapy (Signed)
[place] [address] [place], [place], [address] Phone: [phone]   Fax:  [phone]  Physical Therapy Treatment  Patient Details  Name: [name] [name] MRN: [id number] Date of Birth: [birth year] Referring Provider (PT): [name]    Progress Note Reporting Period 06/23/2018  to 07/14/2018  ROM 4-106  See note below for Objective Data and Assessment of Progress/Goals.      Encounter Date: 07/14/2018  PT End of Session - 07/14/18 1206    Visit Number  10    Number of Visits  12    Date for PT Re-Evaluation  07/23/18    Authorization - Visit Number  10    Authorization - Number of Visits  12    PT Start Time  7253    PT Stop Time  0938    PT Time Calculation (min)  43 min    Activity Tolerance  Patient tolerated treatment well    Behavior During Therapy  [facility] for tasks assessed/performed       Past Medical History:  Diagnosis Date  . Aortic atherosclerosis ([place]) 10/19/2016    Seen on chest x-ray July 2018  . Arthritis    right knee and hip   . Cancer [facility])    prostate cancer   . Chronic kidney disease    prostate cancer   . Diabetes mellitus without complication ([place])   . GERD (gastroesophageal reflux disease)   . Gout   . Gout   . Hypercholesteremia   . Hyperglycemia   . Hyperlipidemia   . Hypertension   . Sleep apnea   . Status post THR (total hip replacement) 04/20/13 05/01/2013   Right total hip 04/20/2013 Dr. Lydia Guiles implant     Past Surgical History:  Procedure Laterality Date  . COLONOSCOPY    . COLONOSCOPY N/A 10/22/2013   Procedure: COLONOSCOPY;  Surgeon: Danie Binder, MD;  Location: [facility];  Service: Endoscopy;  Laterality: N/A;  1115  . HERNIA REPAIR     right inguinal hernia repair   . NASAL SEPTUM SURGERY    . OTHER SURGICAL HISTORY     deviated septum surgery   . ROBOT ASSISTED LAPAROSCOPIC RADICAL PROSTATECTOMY  04/20/2011   Procedure: ROBOTIC ASSISTED LAPAROSCOPIC  RADICAL PROSTATECTOMY;  Surgeon: Malka So, MD;  Location: [facility]S;  Service: Urology;  Laterality: N/A;  Bilateral lymph node dissection  . TOTAL HIP ARTHROPLASTY Right 04/20/2013   Procedure: TOTAL HIP ARTHROPLASTY;  Surgeon: Carole Civil, MD;  Location: [facility] ORS;  Service: Orthopedics;  Laterality: Right;  . TOTAL KNEE ARTHROPLASTY Right 05/16/2018   Procedure: TOTAL KNEE ARTHROPLASTY;  Surgeon: Carole Civil, MD;  Location: [facility] ORS;  Service: Orthopedics;  Laterality: Right;    There were no vitals filed for this visit.  Subjective Assessment - 07/14/18 1155    Subjective  pt reports no issues currently, getting looser.  Scheduled to return to work on 5/16 and returns to MD 5/18    Currently in Pain?  No/denies         [facility] PT Assessment - 07/14/18 1202      Assessment   Medical Diagnosis  Rt TKR    Referring Provider (PT)  [name]     Onset Date/Surgical Date  05/16/18    Next MD Visit  06/25/2008    Prior Therapy  SNF, HH      Precautions   Precautions  None      Restrictions  Weight Bearing Restrictions  No      Prior Function   Level of Independence  Independent    Vocation  Retired    Photographer, walks       Cognition   Overall Cognitive Status  Within Functional Limits for tasks assessed      Observation/Other Assessments   Focus on Therapeutic Outcomes (FOTO)   assess next session      Functional Tests   Functional tests  Single leg stance;Sit to Stand      Single Leg Stance   Comments  LT: 30";  RT: 15"    on 4/3  Lt: 30", Rt: 0"     Sit to Stand   Comments  --      AROM   Right Knee Extension  4   was lacking 10 degrees on 4/3   Right Knee Flexion  106   was 95 degrees on 4/3     Strength   Right Hip Flexion  --    Right Hip Extension  --    Right Hip ABduction  --    Left Hip Extension  --    Left Hip ABduction  --    Right Knee Flexion  --    Right Knee Extension  --    Right Ankle Dorsiflexion  --       Ambulation/Gait   Ambulation Distance (Feet)  565 Feet    Assistive device  None    Gait Pattern  Decreased hip/knee flexion - right;Decreased dorsiflexion - right    Gait Comments  3 minute test                    [facility] Adult PT Treatment/Exercise - 07/14/18 1156      Knee/Hip Exercises: Stretches   Active Hamstring Stretch  Right;3 reps    Active Hamstring Stretch Limitations  standing 12" step 30" holds    Knee: Self-Stretch to increase Flexion  Right;10 seconds;Limitations    Knee: Self-Stretch Limitations  10 reps with 12" box    Gastroc Stretch  Both;3 reps;30 seconds    Gastroc Stretch Limitations  slant board      Knee/Hip Exercises: Aerobic   Stationary Bike  seat 15 full rev fwd/backward revolutions 4 minutes      Knee/Hip Exercises: Standing   Knee Flexion  Right;10 reps;2 sets;Limitations    Knee Flexion Limitations  2nd set TKF using 6" step    Forward Lunges  Both;15 reps    Forward Lunges Limitations  onto 4" step no UE assist    Lateral Step Up  Right;10 reps;Step Height: 4";Hand Hold: 1    Lateral Step Up Limitations  cues for slowly, controlled motion    Forward Step Up  Right;10 reps;Hand Hold: 0;Step Height: 6"    Step Down  Right;10 reps;Hand Hold: 1;Step Height: 4"    Stairs  6" reciprocally without HR using SPC 5RT    Gait Training  around gymn with cues for reduced antalgia      Knee/Hip Exercises: Supine   Quad Sets  Right;10 reps    Short Arc Quad Sets  Right;15 reps    Heel Slides  Right;10 reps;2 sets    Knee Extension  AROM;Right    Knee Extension Limitations  4    Knee Flexion  AROM;Right    Knee Flexion Limitations  106    Other Supine Knee/Hip Exercises  AAROM flexion to 110      Knee/Hip Exercises:  Prone   Hamstring Curl  10 reps    Contract/Relax to Increase Flexion  3 reps    Other Prone Exercises  quad set 10 reps      Manual Therapy   Manual Therapy  Myofascial release;Other (comment);Joint mobilization    Manual  therapy comments  supine to Rt knee    Joint Mobilization  to improve knee extension and flexion    Myofascial Release  to reduce adhesions periemter of knee prior to [facility]    Other Manual Therapy  contract relax to increase flexion, PROM for extension in supine               PT Short Term Goals - 07/14/18 1159      PT SHORT TERM GOAL #1   Title  Pt Rt knee extension to be less than five degrees to allow more normalized gait pattern.     Time  2    Period  Weeks    Status  Achieved    Target Date  07/07/18      PT SHORT TERM GOAL #2   Title  Pt Rt knee flexion to be to 110 to allow pt to sit for an hour in comfort for meals and socializing.     Baseline  AROM 106, AAROM 110    Time  2    Period  Weeks    Status  Partially Met      PT SHORT TERM GOAL #3   Title  Pt strength to be increased by 1/2 grade to allow pt to rise from soft low lying surface, ie couch, without difficulty.     Time  2    Period  Weeks    Status  On-going      PT SHORT TERM GOAL #4   Title  Pt to be able to single leg stance for 10 seconds to allow pt to feel confident walking on uneven ground.     Time  2    Period  Weeks    Status  Achieved      PT SHORT TERM GOAL #5   Title  Pt pain in his RT knee to be no greater than a 3/10 to allow pt to sleep waking only one time throughout the night.     Time  2    Period  Weeks    Status  Achieved        PT Long Term Goals - 07/14/18 1201      PT LONG TERM GOAL #1   Title  Pt Rt knee extension to be at 0 to allow a normal gait pattern.     Time  4    Period  Weeks    Status  On-going      PT LONG TERM GOAL #2   Title  Pt Rt knee flextion to be at 120 to allow pt to be able to squat to work out in the yard.     Time  4    Period  Weeks    Status  On-going      PT LONG TERM GOAL #3   Title  Pt LE strength to be increased one grade to allow pt to ascend and descend 7 steps in a reciprocal manner.     Time  4    Period  Weeks    Status   On-going      PT LONG TERM GOAL #4   Title  Pt to be able to  single leg stance on his right leg for 20 seconds to allow pt to feel comfortable walking without his cane both in and outside.     Time  4    Period  Weeks    Status  On-going            Plan - 07/14/18 1207    Clinical Impression Statement  Pt is overall progressing well S/P Rt TKA.  ROM has increased 4-106 (was 10--95) and is now able to make full revolutions on bike.  Pt has met 3/5 STGs and is progressing towards LTG's.  Pt has no pain, only stiffness and is not limiting sleep patterns at this time.  Pt is community ambulating using SPC and is able to ambulate indoors without AD and minimal antalgia.   Pt will continue to benefit from skilled PT to maximize his functional ability.    Personal Factors and Comorbidities  Age;Comorbidity 2    Comorbidities  HTN,DM, OA     Examination-Activity Limitations  Bend;Dressing;Squat;Stairs;Stand    Examination-Participation Restrictions  Cleaning;Driving;Yard Work    Stability/Clinical Decision Making  Stable/Uncomplicated    Rehab Potential  Good    PT Frequency  3x / week    PT Duration  4 weeks    PT Treatment/Interventions  ADLs/Self Care Home Management;Gait training;Stair training;Functional mobility training;Therapeutic activities;Therapeutic exercise;Balance training;Patient/family education;Manual techniques    PT Next Visit Plan  Continue primary focus on ROM until WNL, manual to improve patella mobility and reduce adhesions.   PRogress towards goals.      PT Home Exercise Plan  heel raises, Single leg stance, LAQ, Q-set, supine hamstring stretch . 4/6: standing TKE with BTB, hamstring stretch standing, knee flexion stretch standing    Consulted and Agree with Plan of Care  Patient       Patient will benefit from skilled therapeutic intervention in order to improve the following deficits and impairments:  Abnormal gait, Decreased activity tolerance, Decreased balance,  Decreased range of motion, Decreased strength, Increased edema, Increased fascial restricitons, Impaired flexibility, Pain, Decreased coordination  Visit Diagnosis: Muscle weakness (generalized)  Acute pain of right knee  Stiffness of right knee, not elsewhere classified  Gait abnormality     Problem List Patient Active Problem List   Diagnosis Date Noted  . Cellulitis of right knee 05/29/2018  . Benign essential hypertension 05/22/2018  . Status post right knee replacement 05/22/2018  . Constipation due to opioid therapy 05/22/2018  . Adult BMI 32.0-32.9 kg/sq m 05/22/2018  . Primary osteoarthritis of knee 05/16/2018  . Aortic atherosclerosis ([place]) 10/19/2016  . History of left hip replacement 05/01/2013  . Prediabetes 03/26/2013  . Hyperlipidemia 03/26/2013  . Microproteinuria 03/26/2013  . Gout 03/26/2013  . Back pain 09/05/2012  . Prostate cancer [facility]) 04/20/2011    Rayetta Humphrey, PT CLT [phone] 07/14/2018, 2:32 PM  [place] [address] [place], [place], [address] Phone: [phone]   Fax:  [phone]  Name: [name] [name] MRN: [id number] Date of Birth: [birth year]

## 2018-07-17 ENCOUNTER — Ambulatory Visit (HOSPITAL_COMMUNITY): Payer: Medicare HMO | Admitting: Physical Therapy

## 2018-07-17 ENCOUNTER — Other Ambulatory Visit: Payer: Self-pay

## 2018-07-17 DIAGNOSIS — M6281 Muscle weakness (generalized): Secondary | ICD-10-CM | POA: Diagnosis not present

## 2018-07-17 DIAGNOSIS — M25561 Pain in right knee: Secondary | ICD-10-CM | POA: Diagnosis not present

## 2018-07-17 DIAGNOSIS — R269 Unspecified abnormalities of gait and mobility: Secondary | ICD-10-CM

## 2018-07-17 DIAGNOSIS — M25661 Stiffness of right knee, not elsewhere classified: Secondary | ICD-10-CM

## 2018-07-17 NOTE — Therapy (Signed)
[facility] [address] [place], [place], [address] Phone: [phone]   Fax:  [phone]  Physical Therapy Treatment  Patient Details  Name: [name] [name] MRN: [id number] Date of Birth: [birth year] Referring Provider (PT): [name]    Encounter Date: 07/17/2018  PT End of Session - 07/17/18 1037    Visit Number  11    Number of Visits  12    Date for PT Re-Evaluation  07/23/18    Authorization Type  cert 4/1-5/8    Authorization - Visit Number  11    Authorization - Number of Visits  12    PT Start Time  0905    PT Stop Time  0950    PT Time Calculation (min)  45 min    Activity Tolerance  Patient tolerated treatment well    Behavior During Therapy  [facility] for tasks assessed/performed       Past Medical History:  Diagnosis Date  . Aortic atherosclerosis ([place]) 10/19/2016    Seen on chest x-ray July 2018  . Arthritis    right knee and hip   . Cancer [facility])    prostate cancer   . Chronic kidney disease    prostate cancer   . Diabetes mellitus without complication ([place])   . GERD (gastroesophageal reflux disease)   . Gout   . Gout   . Hypercholesteremia   . Hyperglycemia   . Hyperlipidemia   . Hypertension   . Sleep apnea   . Status post THR (total hip replacement) 04/20/13 05/01/2013   Right total hip 04/20/2013 Dr. Lydia Guiles implant     Past Surgical History:  Procedure Laterality Date  . COLONOSCOPY    . COLONOSCOPY N/A 10/22/2013   Procedure: COLONOSCOPY;  Surgeon: Danie Binder, MD;  [facility];  Service: Endoscopy;  Laterality: N/A;  1115  . HERNIA REPAIR     right inguinal hernia repair   . NASAL SEPTUM SURGERY    . OTHER SURGICAL HISTORY     deviated septum surgery   . ROBOT ASSISTED LAPAROSCOPIC RADICAL PROSTATECTOMY  04/20/2011   Procedure: ROBOTIC ASSISTED LAPAROSCOPIC RADICAL PROSTATECTOMY;  Surgeon: Malka So, MD;  Location: [facility] ORS;  Service: Urology;  Laterality: N/A;   Bilateral lymph node dissection  . TOTAL HIP ARTHROPLASTY Right 04/20/2013   Procedure: TOTAL HIP ARTHROPLASTY;  Surgeon: Carole Civil, MD;  Location: [facility] ORS;  Service: Orthopedics;  Laterality: Right;  . TOTAL KNEE ARTHROPLASTY Right 05/16/2018   Procedure: TOTAL KNEE ARTHROPLASTY;  Surgeon: Carole Civil, MD;  Location: [facility] ORS;  Service: Orthopedics;  Laterality: Right;    There were no vitals filed for this visit.  Subjective Assessment - 07/17/18 0925    Subjective  Pt reports no issues or complaints. States he continues to work on improving his ROM at home.    Currently in Pain?  No/denies                       [facility] Adult PT Treatment/Exercise - 07/17/18 0001      Knee/Hip Exercises: Stretches   Active Hamstring Stretch  Right;3 reps    Active Hamstring Stretch Limitations  standing 12" step 30" holds    Knee: Self-Stretch to increase Flexion  Right;10 seconds;Limitations    Knee: Self-Stretch Limitations  10 reps with 12" box    Gastroc Stretch  Both;3 reps;30 seconds    Gastroc Stretch Limitations  slant board  Knee/Hip Exercises: Aerobic   Stationary Bike  seat 15 full rev fwd/backward revolutions 4 minutes      Knee/Hip Exercises: Standing   Knee Flexion  Right;10 reps;2 sets;Limitations    Knee Flexion Limitations  2nd set TKF using 6" step    Lateral Step Up  Right;10 reps;Hand Hold: 1;Step Height: 6"    Lateral Step Up Limitations  cues for slowly, controlled motion    Forward Step Up  Right;10 reps;Hand Hold: 0;Step Height: 6"    Forward Step Up Limitations  eccentric and concentric control, no "hopping"    Step Down  Right;10 reps;Hand Hold: 1;Step Height: 6"    Gait Training  around gymn with cues for reduced antalgia      Knee/Hip Exercises: Supine   Quad Sets  Right;10 reps    Short Arc Quad Sets  Right;15 reps    Heel Slides  Right;10 reps;2 sets    Knee Extension  AROM;Right    Knee Extension Limitations  3    Knee Flexion   AROM;Right    Knee Flexion Limitations  108    Other Supine Knee/Hip Exercises  AAROM 114 degrees flexion      Manual Therapy   Manual Therapy  Myofascial release;Other (comment);Joint mobilization    Manual therapy comments  supine to Rt knee    Joint Mobilization  to improve knee extension and flexion    Myofascial Release  to reduce adhesions periemter of knee prior to [facility]               PT Short Term Goals - 07/14/18 1159      PT SHORT TERM GOAL #1   Title  Pt Rt knee extension to be less than five degrees to allow more normalized gait pattern.     Time  2    Period  Weeks    Status  Achieved    Target Date  07/07/18      PT SHORT TERM GOAL #2   Title  Pt Rt knee flexion to be to 110 to allow pt to sit for an hour in comfort for meals and socializing.     Baseline  AROM 106, AAROM 110    Time  2    Period  Weeks    Status  Partially Met      PT SHORT TERM GOAL #3   Title  Pt strength to be increased by 1/2 grade to allow pt to rise from soft low lying surface, ie couch, without difficulty.     Time  2    Period  Weeks    Status  On-going      PT SHORT TERM GOAL #4   Title  Pt to be able to single leg stance for 10 seconds to allow pt to feel confident walking on uneven ground.     Time  2    Period  Weeks    Status  Achieved      PT SHORT TERM GOAL #5   Title  Pt pain in his RT knee to be no greater than a 3/10 to allow pt to sleep waking only one time throughout the night.     Time  2    Period  Weeks    Status  Achieved        PT Long Term Goals - 07/14/18 1201      PT LONG TERM GOAL #1   Title  Pt Rt knee extension to be at 0 to  allow a normal gait pattern.     Time  4    Period  Weeks    Status  On-going      PT LONG TERM GOAL #2   Title  Pt Rt knee flextion to be at 120 to allow pt to be able to squat to work out in the yard.     Time  4    Period  Weeks    Status  On-going      PT LONG TERM GOAL #3   Title  Pt LE strength to be  increased one grade to allow pt to ascend and descend 7 steps in a reciprocal manner.     Time  4    Period  Weeks    Status  On-going      PT LONG TERM GOAL #4   Title  Pt to be able to single leg stance on his right leg for 20 seconds to allow pt to feel comfortable walking without his cane both in and outside.     Time  4    Period  Weeks    Status  On-going            Plan - 07/17/18 1038    Clinical Impression Statement  pt continues to make improvements with reduced tightness perimeter of knee and and increased ease of mobility, i.e making full revolutions on bike.  Pt also with noted improvement in gait today only increasing antaligia with fatigue.  Able to increase to 6" step for lateral and forward step downs this session. Pt with improved AROM this session 3-108 (was 4-106 last session).  Also able to achiewve 114 degrees flexion with AAROM.  Pt slated to return to MD 5/18 and determine RTW status.  No other issues this session.    Personal Factors and Comorbidities  Age;Comorbidity 2    Comorbidities  HTN,DM, OA     Examination-Activity Limitations  Bend;Dressing;Squat;Stairs;Stand    Examination-Participation Restrictions  Cleaning;Driving;Yard Work    Stability/Clinical Decision Making  Stable/Uncomplicated    Rehab Potential  Good    PT Frequency  3x / week    PT Duration  4 weeks    PT Treatment/Interventions  ADLs/Self Care Home Management;Gait training;Stair training;Functional mobility training;Therapeutic activities;Therapeutic exercise;Balance training;Patient/family education;Manual techniques    PT Next Visit Plan  Continue primary focus on ROM until WNL, manual to improve patella mobility and reduce adhesions.   PRogress towards goals.      PT Home Exercise Plan  heel raises, Single leg stance, LAQ, Q-set, supine hamstring stretch . 4/6: standing TKE with BTB, hamstring stretch standing, knee flexion stretch standing    Consulted and Agree with Plan of Care   Patient       Patient will benefit from skilled therapeutic intervention in order to improve the following deficits and impairments:  Abnormal gait, Decreased activity tolerance, Decreased balance, Decreased range of motion, Decreased strength, Increased edema, Increased fascial restricitons, Impaired flexibility, Pain, Decreased coordination  Visit Diagnosis: Muscle weakness (generalized)  Acute pain of right knee  Stiffness of right knee, not elsewhere classified  Gait abnormality     Problem List Patient Active Problem List   Diagnosis Date Noted  . Cellulitis of right knee 05/29/2018  . Benign essential hypertension 05/22/2018  . Status post right knee replacement 05/22/2018  . Constipation due to opioid therapy 05/22/2018  . Adult BMI 32.0-32.9 kg/sq m 05/22/2018  . Primary osteoarthritis of knee 05/16/2018  . Aortic atherosclerosis ([place])  10/19/2016  . History of left hip replacement 05/01/2013  . Prediabetes 03/26/2013  . Hyperlipidemia 03/26/2013  . Microproteinuria 03/26/2013  . Gout 03/26/2013  . Back pain 09/05/2012  . Prostate cancer ([place]) 04/20/2011   Teena Irani, PTA/CLT [phone]  Teena Irani 07/17/2018, 10:45 AM  [place], [place], [address] Phone: [phone]   Fax:  [phone]  Name: [name] [name] MRN: [id number] Date of Birth: 12/14/1950

## 2018-07-19 ENCOUNTER — Ambulatory Visit (HOSPITAL_COMMUNITY): Payer: Medicare HMO

## 2018-07-19 ENCOUNTER — Other Ambulatory Visit: Payer: Self-pay

## 2018-07-19 ENCOUNTER — Ambulatory Visit (INDEPENDENT_AMBULATORY_CARE_PROVIDER_SITE_OTHER): Payer: Medicare HMO | Admitting: Family Medicine

## 2018-07-19 DIAGNOSIS — Z125 Encounter for screening for malignant neoplasm of prostate: Secondary | ICD-10-CM | POA: Diagnosis not present

## 2018-07-19 DIAGNOSIS — R05 Cough: Secondary | ICD-10-CM

## 2018-07-19 DIAGNOSIS — I700 Atherosclerosis of aorta: Secondary | ICD-10-CM | POA: Diagnosis not present

## 2018-07-19 DIAGNOSIS — R058 Other specified cough: Secondary | ICD-10-CM

## 2018-07-19 DIAGNOSIS — R7303 Prediabetes: Secondary | ICD-10-CM | POA: Diagnosis not present

## 2018-07-19 DIAGNOSIS — R269 Unspecified abnormalities of gait and mobility: Secondary | ICD-10-CM

## 2018-07-19 DIAGNOSIS — I10 Essential (primary) hypertension: Secondary | ICD-10-CM | POA: Diagnosis not present

## 2018-07-19 DIAGNOSIS — E785 Hyperlipidemia, unspecified: Secondary | ICD-10-CM | POA: Diagnosis not present

## 2018-07-19 DIAGNOSIS — M25561 Pain in right knee: Secondary | ICD-10-CM | POA: Diagnosis not present

## 2018-07-19 DIAGNOSIS — M25661 Stiffness of right knee, not elsewhere classified: Secondary | ICD-10-CM

## 2018-07-19 DIAGNOSIS — M6281 Muscle weakness (generalized): Secondary | ICD-10-CM | POA: Diagnosis not present

## 2018-07-19 MED ORDER — POTASSIUM CHLORIDE ER 10 MEQ PO TBCR: 10.0000 meq | EXTENDED_RELEASE_TABLET | Freq: Every day | ORAL | 1 refills | Status: DC

## 2018-07-19 MED ORDER — HYDROCHLOROTHIAZIDE 25 MG PO TABS: 25.0000 mg | ORAL_TABLET | Freq: Every day | ORAL | 1 refills | Status: DC

## 2018-07-19 MED ORDER — AMLODIPINE BESYLATE 5 MG PO TABS: 5.0000 mg | ORAL_TABLET | Freq: Every day | ORAL | 1 refills | Status: DC

## 2018-07-19 NOTE — Progress Notes (Signed)
   Subjective:    Patient ID: [name] [name], male    DOB: 1950-05-30, 68 y.o.   MRN: [id number] Format- video  Patient present at home Provider present at office Consent for interaction obtained Coronavirus outbreak made virtual visit necessary   Hypertension  This is a chronic problem. The current episode started more than 1 year ago. Risk factors for coronary artery disease include dyslipidemia. Treatments tried: zestoretic 20/25. There are no compliance problems.   Patient would like to discuss changing the lisinopril due to dry cough Patient denies any type of chest tightness pressure pain shortness of breath He states he is losing weight by watching how he eats Taking his medication on a regular basis. Has a history of aortic atherosclerosis but keeping it under good control by keeping cholesterol weight under control Virtual Visit via Video Note  I connected with [name] [name] on 07/19/18 at  1:10 PM EDT by a video enabled telemedicine application and verified that I am speaking with the correct person using two identifiers.   I discussed the limitations of evaluation and management by telemedicine and the availability of in person appointments. The patient expressed understanding and agreed to proceed.  History of Present Illness:    Observations/Objective:   Assessment and Plan:   Follow Up Instructions:    I discussed the assessment and treatment plan with the patient. The patient was provided an opportunity to ask questions and all were answered. The patient agreed with the plan and demonstrated an understanding of the instructions.   The patient was advised to call back or seek an in-person evaluation if the symptoms worsen or if the condition fails to improve as anticipated.  I provided 15 minutes of non-face-to-face time during this encounter.        Review of Systems     Objective:   Physical Exam  15 minutes spent with patient discussing the  below items  15 minutes was spent with patient today discussing healthcare issues which they came.  More than 50% of this visit-total duration of visit-was spent in counseling and coordination of care.  Please see diagnosis regarding the focus of this coordination and care       Assessment & Plan:  HTN-overall good control watching diet is losing weight  Dry cough-probable side effect to lisinopril it is reasonable to consider making a change in medication I would recommend stopping the lisinopril.  We will go with  Plain HCTZ 25 mg every morning KCl 10 M EQ 1 every morning Add amlodipine 5 mg 1 every morning  Patient will send [place] update in 3 weeks on how he is doing sooner if any problems  Lab work later in the summer with a wellness/chronic health issues, August  The patient was told that it could take several weeks for the dry cough to go away if the cough is not gone away over the course the next several weeks then the next step would be looking at possibility of postnasal drip versus GERD versus other issues

## 2018-07-19 NOTE — Patient Instructions (Signed)
Lunge with Counter Support reps: 10 sets: 2-3 daily: 1 weekly: 7   Exercise image step 1   Exercise image step 2  Setup  Begin in a standing upright position with your hand resting on a counter at your side. Movement  Step forward and bend your knees to lower your body into a lunge position. Press into your feet and straighten your legs to stand up and repeat. Tip  Make sure to keep your trunk upright and use the counter to help you balance as needed. Do not let your front knee collapse inward or bend forward past your toes. Prone Quadriceps Stretch with Strap reps: 2-3 hold: 30 seconds daily: 2 weekly: 7   Exercise image step 1   Exercise image step 2  Setup  Begin lying on your front with your legs straight, holding the end of a strap that is looped around one foot.  Movement  Pull the end of the strap over your shoulder on the same side of your body, bending your knee, until you feel a gentle stretch in your thigh.  Tip  Do not let your low back arch during the stretch. Disclaimer: This program provides exercises related to your condition that you can perform at home. As there is a risk of injury with any activity, use caution when performing exercises. If you experience any pain or discomfort, discontinue the exercises and contact your health care provider.  Login URL: [email or web address] . Access Code: [email or web address] . Date printed: 07/19/2018 Page 1  Supine Quad Set reps: 15 sets: 3 hold: 3 seconds daily: 1  weekly: 7      Exercise image step 1   Exercise image step 2  Setup  Begin lying on your back with one knee bent and your other leg straight with your knee resting on a towel roll. Movement  Gently squeeze your thigh muscles, pushing the back of your knee down into the towel. Tip  Make sure to keep your back flat against the floor during the exercise.

## 2018-07-19 NOTE — Therapy (Signed)
[place], [place], [address] Phone: [phone]   Fax:  [phone]  Physical Therapy Treatment  Patient Details  Name: [name] [name] MRN: [id number] Date of Birth: [birth year] Referring Provider (PT): [name]    Encounter Date: 07/19/2018  PT End of Session - 07/19/18 0914    Visit Number  12    Number of Visits  16    Date for PT Re-Evaluation  07/23/18    Authorization Type  --    Authorization Time Period  cert 2/3-7/6; second 07/19/18-08/04/18    Authorization - Visit Number  12    Authorization - Number of Visits  16    PT Start Time  0905    PT Stop Time  0948    PT Time Calculation (min)  43 min    Activity Tolerance  Patient tolerated treatment well    Behavior During Therapy  [facility] for tasks assessed/performed       Past Medical History:  Diagnosis Date  . Aortic atherosclerosis ([place]) 10/19/2016    Seen on chest x-ray July 2018  . Arthritis    right knee and hip   . Cancer [facility])    prostate cancer   . Chronic kidney disease    prostate cancer   . Diabetes mellitus without complication ([place])   . GERD (gastroesophageal reflux disease)   . Gout   . Gout   . Hypercholesteremia   . Hyperglycemia   . Hyperlipidemia   . Hypertension   . Sleep apnea   . Status post THR (total hip replacement) 04/20/13 05/01/2013   Right total hip 04/20/2013 Dr. Lydia Guiles implant     Past Surgical History:  Procedure Laterality Date  . COLONOSCOPY    . COLONOSCOPY N/A 10/22/2013   Procedure: COLONOSCOPY;  Surgeon: Danie Binder, MD;  [facility];  Service: Endoscopy;  Laterality: N/A;  1115  . HERNIA REPAIR     right inguinal hernia repair   . NASAL SEPTUM SURGERY    . OTHER SURGICAL HISTORY     deviated septum surgery   . ROBOT ASSISTED LAPAROSCOPIC RADICAL PROSTATECTOMY  04/20/2011   Procedure: ROBOTIC ASSISTED LAPAROSCOPIC RADICAL PROSTATECTOMY;  Surgeon: Malka So, MD;   Location: [facility];  Service: Urology;  Laterality: N/A;  Bilateral lymph node dissection  . TOTAL HIP ARTHROPLASTY Right 04/20/2013   Procedure: TOTAL HIP ARTHROPLASTY;  Surgeon: Carole Civil, MD;  Location: [facility] OR[facility];  Service: Orthopedics;  Laterality: Right;  . TOTAL KNEE ARTHROPLASTY Right 05/16/2018   Procedure: TOTAL KNEE ARTHROPLASTY;  Surgeon: Carole Civil, MD;  Location: [facility] ORS;  Service: Orthopedics;  Laterality: Right;    There were no vitals filed for this visit.  Subjective Assessment - 07/19/18 1010    Subjective  Patient reports he is doing well overall and that he is doing his exercises ~3 times per day. He denies pain in his knee.     Pertinent History  OA, HTN, DM, S/P Rt THR     Limitations  Standing;Walking;Sitting;House hold activities    Patient Stated Goals  walk without the cane, be able to go up and down steps reciprocally, bend his knee better.      Currently in Pain?  No/denies         [facility] PT Assessment - 07/19/18 0001      Assessment   Medical Diagnosis  Rt TKR    Referring Provider (PT)  Dorothyann Peng  [place]     Onset Date/Surgical Date  05/16/18    Prior Therapy  SNF, HH      Precautions   Precautions  None      Restrictions   Weight Bearing Restrictions  No      Prior Function   Level of Independence  Independent    Vocation  Retired    Photographer, walks       Cognition   Overall Cognitive Status  Within Functional Limits for tasks assessed      Observation/Other Assessments   Focus on Therapeutic Outcomes (FOTO)   42% limited   was 42% limited     AROM   Right Knee Extension  6    Right Knee Flexion  110         [facility] Adult PT Treatment/Exercise - 07/19/18 0001      Knee/Hip Exercises: Stretches   Active Hamstring Stretch  Right;3 reps    Active Hamstring Stretch Limitations  standing 12" step 30" holds    Knee: Self-Stretch to increase Flexion  Right;10 seconds;Limitations    Knee: Self-Stretch Limitations  10 reps  with 12" box; MWM Grade IV overpressure from PT    Other Knee/Hip Stretches  Prone knee flexion stretch with rope: Rt LE 3x 30 seconds      Knee/Hip Exercises: Aerobic   Stationary Bike  seat 15 full rev fwd/backward revolutions 4 minutes      Knee/Hip Exercises: Standing   Heel Raises  Both;1 set;20 reps;3 seconds   on incline   Heel Raises Limitations  1x 20 reps toe raises on decline, 3 sec holds    Forward Lunges  Both;2 sets;10 reps    Forward Lunges Limitations  cues for form to flex back leg    Step Down  Right;2 sets;10 reps;Hand Hold: 2;Step Height: 4"    Other Standing Knee Exercises  Reverse step up: 1x 10 reps, 4" step, 1 HHA      Knee/Hip Exercises: Supine   Quad Sets  Right;1 set;15 reps   3 sec holds, towel under knee       PT Education - 07/19/18 1013    Education Details  Educated on FOTO progress for outcome measure. Educated on ROM and plan to reduce to 2x/week for next 2 weeks. Educated on additional HEP.    Person(s) Educated  Patient    Methods  Explanation;Handout;Demonstration    Comprehension  Verbalized understanding       PT Short Term Goals - 07/14/18 1159      PT SHORT TERM GOAL #1   Title  Pt Rt knee extension to be less than five degrees to allow more normalized gait pattern.     Time  2    Period  Weeks    Status  Achieved    Target Date  07/07/18      PT SHORT TERM GOAL #2   Title  Pt Rt knee flexion to be to 110 to allow pt to sit for an hour in comfort for meals and socializing.     Baseline  AROM 106, AAROM 110    Time  2    Period  Weeks    Status  Partially Met      PT SHORT TERM GOAL #3   Title  Pt strength to be increased by 1/2 grade to allow pt to rise from soft low lying surface, ie couch, without difficulty.     Time  2  Period  Weeks    Status  On-going      PT SHORT TERM GOAL #4   Title  Pt to be able to single leg stance for 10 seconds to allow pt to feel confident walking on uneven ground.     Time  2    Period   Weeks    Status  Achieved      PT SHORT TERM GOAL #5   Title  Pt pain in his RT knee to be no greater than a 3/10 to allow pt to sleep waking only one time throughout the night.     Time  2    Period  Weeks    Status  Achieved        PT Long Term Goals - 07/14/18 1201      PT LONG TERM GOAL #1   Title  Pt Rt knee extension to be at 0 to allow a normal gait pattern.     Time  4    Period  Weeks    Status  On-going      PT LONG TERM GOAL #2   Title  Pt Rt knee flextion to be at 120 to allow pt to be able to squat to work out in the yard.     Time  4    Period  Weeks    Status  On-going      PT LONG TERM GOAL #3   Title  Pt LE strength to be increased one grade to allow pt to ascend and descend 7 steps in a reciprocal manner.     Time  4    Period  Weeks    Status  On-going      PT LONG TERM GOAL #4   Title  Pt to be able to single leg stance on his right leg for 20 seconds to allow pt to feel comfortable walking without his cane both in and outside.     Time  4    Period  Weeks    Status  On-going        Plan - 07/19/18 0915    Clinical Impression Statement  Patient's AROM this session was limited form 6-110 degrees on Rt LE. Session focused on ROM exercises and functional strengthening to improve stair negotiation. He demonstrated unsteadiness with ascending/descending stairs with 1 handrail. Reveres step up and step downs were initiated today to challenge Rt quad strength and quad set was reviewed as patient demonstrated tendency to use gluteals to compensate for quad weakness. He has expressed interest in returning to gardening and having difficulty bending down to ground to plant flowers. He may benefit from education on half kneeling technique to be able to garden. He will continue to benefit from skilled PT interventions to address impairments and progress towards goals.     Personal Factors and Comorbidities  Age;Comorbidity 2    Comorbidities  HTN,DM, OA      Examination-Activity Limitations  Bend;Dressing;Squat;Stairs;Stand    Examination-Participation Restrictions  Cleaning;Driving;Yard Work    Stability/Clinical Decision Making  Stable/Uncomplicated    Rehab Potential  Good    PT Frequency  2x / week    PT Duration  2 weeks   2 additional   PT Treatment/Interventions  ADLs/Self Care Home Management;Gait training;Stair training;Functional mobility training;Therapeutic activities;Therapeutic exercise;Balance training;Patient/family education;Manual techniques    PT Next Visit Plan  Continue primary focus on ROM until WNL, manual to improve patella mobility and reduce adhesions. Progress quad strengthening for  knee extension and for stair mobility. Educate on half kneeling transfer for gardening.     PT Home Exercise Plan  heel raises, Single leg stance, LAQ, Q-set, supine hamstring stretch . 4/6: standing TKE with BTB, hamstring stretch standing, knee flexion stretch standing; 07/19/18 - lunge, prone quad stretch, quad set    Consulted and Agree with Plan of Care  Patient       Patient will benefit from skilled therapeutic intervention in order to improve the following deficits and impairments:  Abnormal gait, Decreased activity tolerance, Decreased balance, Decreased range of motion, Decreased strength, Increased edema, Increased fascial restricitons, Impaired flexibility, Pain, Decreased coordination  Visit Diagnosis: Muscle weakness (generalized)  Acute pain of right knee  Stiffness of right knee, not elsewhere classified  Gait abnormality     Problem List Patient Active Problem List   Diagnosis Date Noted  . Cellulitis of right knee 05/29/2018  . Benign essential hypertension 05/22/2018  . Status post right knee replacement 05/22/2018  . Constipation due to opioid therapy 05/22/2018  . Adult BMI 32.0-32.9 kg/sq m 05/22/2018  . Primary osteoarthritis of knee 05/16/2018  . Aortic atherosclerosis ([place]) 10/19/2016  . History of left  hip replacement 05/01/2013  . Prediabetes 03/26/2013  . Hyperlipidemia 03/26/2013  . Microproteinuria 03/26/2013  . Gout 03/26/2013  . Back pain 09/05/2012  . Prostate cancer ([place]) 04/20/2011    Kipp Brood, PT, DPT, [facility] Physical Therapist with [facility]  07/19/2018 10:14 AM    [place], [place], [address] Phone: [phone]   Fax:  [phone]  Name: DUQUAN GILLOOLY MRN: [id number] Date of Birth: October 31, 1950

## 2018-07-21 ENCOUNTER — Encounter (HOSPITAL_COMMUNITY): Payer: Self-pay

## 2018-07-21 ENCOUNTER — Ambulatory Visit (HOSPITAL_COMMUNITY): Payer: Medicare HMO | Attending: Orthopedic Surgery

## 2018-07-21 ENCOUNTER — Other Ambulatory Visit: Payer: Self-pay

## 2018-07-21 DIAGNOSIS — M25661 Stiffness of right knee, not elsewhere classified: Secondary | ICD-10-CM | POA: Diagnosis not present

## 2018-07-21 DIAGNOSIS — M25561 Pain in right knee: Secondary | ICD-10-CM

## 2018-07-21 DIAGNOSIS — R269 Unspecified abnormalities of gait and mobility: Secondary | ICD-10-CM | POA: Diagnosis not present

## 2018-07-21 DIAGNOSIS — M6281 Muscle weakness (generalized): Secondary | ICD-10-CM | POA: Diagnosis not present

## 2018-07-21 NOTE — Therapy (Signed)
[place], [place], [address] Phone: [phone]   Fax:  [phone]  Physical Therapy Treatment  Patient Details  Name: [name] [name] MRN: [id number] Date of Birth: [birth year] Referring Provider (PT): [name]    Encounter Date: 07/21/2018  PT End of Session - 07/21/18 0904    Visit Number  13    Number of Visits  16    Date for PT Re-Evaluation  07/23/18    Authorization Time Period  cert 2/0-9/1; second 07/19/18-08/04/18    Authorization - Visit Number  13    Authorization - Number of Visits  16    PT Start Time  0900    PT Stop Time  0940    PT Time Calculation (min)  40 min    Activity Tolerance  Patient tolerated treatment well    Behavior During Therapy  [facility] for tasks assessed/performed       Past Medical History:  Diagnosis Date  . Aortic atherosclerosis ([place]) 10/19/2016    Seen on chest x-ray July 2018  . Arthritis    right knee and hip   . Cancer [facility])    prostate cancer   . Chronic kidney disease    prostate cancer   . Diabetes mellitus without complication ([place])   . GERD (gastroesophageal reflux disease)   . Gout   . Gout   . Hypercholesteremia   . Hyperglycemia   . Hyperlipidemia   . Hypertension   . Sleep apnea   . Status post THR (total hip replacement) 04/20/13 05/01/2013   Right total hip 04/20/2013 Dr. Lydia Guiles implant     Past Surgical History:  Procedure Laterality Date  . COLONOSCOPY    . COLONOSCOPY N/A 10/22/2013   Procedure: COLONOSCOPY;  Surgeon: Danie Binder, MD;  [facility];  Service: Endoscopy;  Laterality: N/A;  1115  . HERNIA REPAIR     right inguinal hernia repair   . NASAL SEPTUM SURGERY    . OTHER SURGICAL HISTORY     deviated septum surgery   . ROBOT ASSISTED LAPAROSCOPIC RADICAL PROSTATECTOMY  04/20/2011   Procedure: ROBOTIC ASSISTED LAPAROSCOPIC RADICAL PROSTATECTOMY;  Surgeon: Malka So, MD;  Location: [facility];  Service:  Urology;  Laterality: N/A;  Bilateral lymph node dissection  . TOTAL HIP ARTHROPLASTY Right 04/20/2013   Procedure: TOTAL HIP ARTHROPLASTY;  Surgeon: Carole Civil, MD;  Location: [facility] ORS;  Service: Orthopedics;  Laterality: Right;  . TOTAL KNEE ARTHROPLASTY Right 05/16/2018   Procedure: TOTAL KNEE ARTHROPLASTY;  Surgeon: Carole Civil, MD;  Location: [facility] ORS;  Service: Orthopedics;  Laterality: Right;    There were no vitals filed for this visit.  Subjective Assessment - 07/21/18 0903    Subjective  Patient reports his knee was really sore yesterday and it still feels stiff today. He states he did his exercises 4 times yesterday and it feels a little better now. He states usually by the end of the day his knee is feeling a little better than when he woke up.    Pertinent History  OA, HTN, DM, S/P Rt THR     Limitations  Standing;Walking;Sitting;House hold activities    Patient Stated Goals  walk without the cane, be able to go up and down steps reciprocally, bend his knee better.      Currently in Pain?  No/denies        [facility] Adult PT Treatment/Exercise - 07/21/18 0001  Knee/Hip Exercises: Stretches   Active Hamstring Stretch  Right;3 reps    Active Hamstring Stretch Limitations  standing 12" step 30" holds    Knee: Self-Stretch to increase Flexion  Right;10 seconds;Limitations    Knee: Self-Stretch Limitations  10 reps      Knee/Hip Exercises: Aerobic   Stationary Bike  seat 15 full rev fwd/backward revolutions 4 minutes      Knee/Hip Exercises: Standing   Terminal Knee Extension  Right;2 sets;10 reps;Theraband    Theraband Level (Terminal Knee Extension)  Level 4 (Blue)    Lateral Step Up  Right;2 sets;15 reps;Hand Hold: 0;Step Height: 4"    Forward Step Up  Right;2 sets;15 reps;Hand Hold: 0;Step Height: 4"    Other Standing Knee Exercises  Reverse step up: 2x 10 reps, 4" step, 1 HHA      Knee/Hip Exercises: Supine   Quad Sets  Right;1 set;15 reps    Quad Sets  Limitations  3 sec holds, pink ball under knee    Heel Slides  Right;1 set;10 reps   5 seconds     Knee/Hip Exercises: Prone   Contract/Relax to Increase Flexion  3x 5 sec conctract, 30 sec relax, Rt LE      Manual Therapy   Manual Therapy  Myofascial release    Manual therapy comments  performed seperate from other interventions    Myofascial Release  to reduce adhesions periemter of knee prior to [facility]        PT Education - 07/21/18 1205    Education Details  Educated patient on technique for performing scar massage at home and to not use lotion when doing this.     Person(s) Educated  Patient    Methods  Explanation;Demonstration    Comprehension  Verbalized understanding;Returned demonstration       PT Short Term Goals - 07/14/18 1159      PT SHORT TERM GOAL #1   Title  Pt Rt knee extension to be less than five degrees to allow more normalized gait pattern.     Time  2    Period  Weeks    Status  Achieved    Target Date  07/07/18      PT SHORT TERM GOAL #2   Title  Pt Rt knee flexion to be to 110 to allow pt to sit for an hour in comfort for meals and socializing.     Baseline  AROM 106, AAROM 110    Time  2    Period  Weeks    Status  Partially Met      PT SHORT TERM GOAL #3   Title  Pt strength to be increased by 1/2 grade to allow pt to rise from soft low lying surface, ie couch, without difficulty.     Time  2    Period  Weeks    Status  On-going      PT SHORT TERM GOAL #4   Title  Pt to be able to single leg stance for 10 seconds to allow pt to feel confident walking on uneven ground.     Time  2    Period  Weeks    Status  Achieved      PT SHORT TERM GOAL #5   Title  Pt pain in his RT knee to be no greater than a 3/10 to allow pt to sleep waking only one time throughout the night.     Time  2    Period  Weeks    Status  Achieved        PT Long Term Goals - 07/14/18 1201      PT LONG TERM GOAL #1   Title  Pt Rt knee extension to be at 0 to allow  a normal gait pattern.     Time  4    Period  Weeks    Status  On-going      PT LONG TERM GOAL #2   Title  Pt Rt knee flextion to be at 120 to allow pt to be able to squat to work out in the yard.     Time  4    Period  Weeks    Status  On-going      PT LONG TERM GOAL #3   Title  Pt LE strength to be increased one grade to allow pt to ascend and descend 7 steps in a reciprocal manner.     Time  4    Period  Weeks    Status  On-going      PT LONG TERM GOAL #4   Title  Pt to be able to single leg stance on his right leg for 20 seconds to allow pt to feel comfortable walking without his cane both in and outside.     Time  4    Period  Weeks    Status  On-going        Plan - 07/21/18 0905    Clinical Impression Statement  Continued with plan to focus on ROM deficits and functional quad strengthening to improve stair negotiation. Patient continued with step ups including reverse step up, requiring 1 UE support to complete safely. He required minimal cuing and demonstration to complete TKE with correct form and improved with repetition. Contract relax stretch for Rt knee flexion was performed and patient reported discomfort but was able to increased ROM for flexion follow isometric contraction each time. He was educated on scar tissue massage to help reduce adhesions and improve ROM. He will continue to benefit from skilled PT interventions to address impairments and improve functional mobility.    Personal Factors and Comorbidities  Age;Comorbidity 2    Comorbidities  HTN,DM, OA     Examination-Activity Limitations  Bend;Dressing;Squat;Stairs;Stand    Examination-Participation Restrictions  Cleaning;Driving;Yard Work    Stability/Clinical Decision Making  Stable/Uncomplicated    Rehab Potential  Good    PT Frequency  2x / week    PT Duration  2 weeks   2 additional   PT Treatment/Interventions  ADLs/Self Care Home Management;Gait training;Stair training;Functional mobility  training;Therapeutic activities;Therapeutic exercise;Balance training;Patient/family education;Manual techniques    PT Next Visit Plan  Continue primary focus on ROM until WNL, manual to improve patella mobility and reduce adhesions. Progress quad strengthening for knee extension and for stair mobility. Educate on half kneeling transfer for gardening.     PT Home Exercise Plan  heel raises, Single leg stance, LAQ, Q-set, supine hamstring stretch . 4/6: standing TKE with BTB, hamstring stretch standing, knee flexion stretch standing; 07/19/18 - lunge, prone quad stretch, quad set    Consulted and Agree with Plan of Care  Patient       Patient will benefit from skilled therapeutic intervention in order to improve the following deficits and impairments:  Abnormal gait, Decreased activity tolerance, Decreased balance, Decreased range of motion, Decreased strength, Increased edema, Increased fascial restricitons, Impaired flexibility, Pain, Decreased coordination  Visit Diagnosis: Muscle weakness (generalized)  Acute pain of right knee  Stiffness of right knee, not elsewhere classified  Gait abnormality     Problem List Patient Active Problem List   Diagnosis Date Noted  . Cellulitis of right knee 05/29/2018  . Benign essential hypertension 05/22/2018  . Status post right knee replacement 05/22/2018  . Constipation due to opioid therapy 05/22/2018  . Adult BMI 32.0-32.9 kg/sq m 05/22/2018  . Primary osteoarthritis of knee 05/16/2018  . Aortic atherosclerosis ([place]) 10/19/2016  . History of left hip replacement 05/01/2013  . Prediabetes 03/26/2013  . Hyperlipidemia 03/26/2013  . Microproteinuria 03/26/2013  . Gout 03/26/2013  . Back pain 09/05/2012  . Prostate cancer ([place]) 04/20/2011    Kipp Brood, PT, DPT, [facility] Physical Therapist with [facility]  07/21/2018 12:05 PM    [place], [place], [address] Phone: [phone]   Fax:  [phone]  Name: [name] [name] MRN: [id number] Date of Birth: June 06, 1950

## 2018-07-26 ENCOUNTER — Other Ambulatory Visit: Payer: Self-pay

## 2018-07-26 ENCOUNTER — Ambulatory Visit (HOSPITAL_COMMUNITY): Payer: Medicare HMO | Admitting: Physical Therapy

## 2018-07-26 DIAGNOSIS — M25661 Stiffness of right knee, not elsewhere classified: Secondary | ICD-10-CM

## 2018-07-26 DIAGNOSIS — R269 Unspecified abnormalities of gait and mobility: Secondary | ICD-10-CM | POA: Diagnosis not present

## 2018-07-26 DIAGNOSIS — M25561 Pain in right knee: Secondary | ICD-10-CM | POA: Diagnosis not present

## 2018-07-26 DIAGNOSIS — M6281 Muscle weakness (generalized): Secondary | ICD-10-CM

## 2018-07-26 NOTE — Therapy (Signed)
[place], [place], [address] Phone: [phone]   Fax:  [phone]  Physical Therapy Treatment  Patient Details  Name: [name] [name] MRN: [id number] Date of Birth: 26-Mar-1950 Referring Provider (PT): [name]    Encounter Date: 07/26/2018  PT End of Session - 07/26/18 1142    Visit Number  14    Number of Visits  16    Date for PT Re-Evaluation  07/23/18    Authorization Time Period  cert 4/1-9/3; second 07/19/18-08/04/18    Authorization - Visit Number  14    Authorization - Number of Visits  16    PT Start Time  7902    PT Stop Time  0938    PT Time Calculation (min)  44 min    Activity Tolerance  Patient tolerated treatment well    Behavior During Therapy  [facility] for tasks assessed/performed       Past Medical History:  Diagnosis Date  . Aortic atherosclerosis ([place]) 10/19/2016    Seen on chest x-ray July 2018  . Arthritis    right knee and hip   . Cancer [facility])    prostate cancer   . Chronic kidney disease    prostate cancer   . Diabetes mellitus without complication ([place])   . GERD (gastroesophageal reflux disease)   . Gout   . Gout   . Hypercholesteremia   . Hyperglycemia   . Hyperlipidemia   . Hypertension   . Sleep apnea   . Status post THR (total hip replacement) 04/20/13 05/01/2013   Right total hip 04/20/2013 Dr. Lydia Guiles implant     Past Surgical History:  Procedure Laterality Date  . COLONOSCOPY    . COLONOSCOPY N/A 10/22/2013   Procedure: COLONOSCOPY;  Surgeon: Danie Binder, MD;  [facility];  Service: Endoscopy;  Laterality: N/A;  1115  . HERNIA REPAIR     right inguinal hernia repair   . NASAL SEPTUM SURGERY    . OTHER SURGICAL HISTORY     deviated septum surgery   . ROBOT ASSISTED LAPAROSCOPIC RADICAL PROSTATECTOMY  04/20/2011   Procedure: ROBOTIC ASSISTED LAPAROSCOPIC RADICAL PROSTATECTOMY;  Surgeon: Malka So, MD;  Location: [facility];  Service:  Urology;  Laterality: N/A;  Bilateral lymph node dissection  . TOTAL HIP ARTHROPLASTY Right 04/20/2013   Procedure: TOTAL HIP ARTHROPLASTY;  Surgeon: Carole Civil, MD;  Location: [facility];  Service: Orthopedics;  Laterality: Right;  . TOTAL KNEE ARTHROPLASTY Right 05/16/2018   Procedure: TOTAL KNEE ARTHROPLASTY;  Surgeon: Carole Civil, MD;  Location: [facility];  Service: Orthopedics;  Laterality: Right;    There were no vitals filed for this visit.  Subjective Assessment - 07/26/18 0927    Subjective  pt states he's doing well.   Stretching alot at home.  Soreness has gotten better, most stiffness is first thing in the morning and after sitting a while.     Currently in Pain?  No/denies                       [facility] Adult PT Treatment/Exercise - 07/26/18 0001      Knee/Hip Exercises: Stretches   Active Hamstring Stretch  Right;3 reps    Active Hamstring Stretch Limitations  standing 12" step 30" holds    Knee: Self-Stretch to increase Flexion  Right;10 seconds;Limitations    Knee: Self-Stretch Limitations  10 reps      Knee/Hip  Exercises: Aerobic   Stationary Bike  seat 15 full rev fwd/backward revolutions 4 minutes      Knee/Hip Exercises: Standing   Terminal Knee Extension  Right;2 sets;10 reps;Theraband    Theraband Level (Terminal Knee Extension)  Level 4 (Blue)    Lateral Step Up  Right;15 reps;Hand Hold: 0;1 set;Step Height: 6"    Forward Step Up  Right;15 reps;Hand Hold: 0;Step Height: 6"    Step Down  Right;10 reps;Hand Hold: 2;Step Height: 6"    Stairs  5RT 7" no HR reciprocally    Other Standing Knee Exercises  stand to kneel, attempted 2X, unable      Knee/Hip Exercises: Supine   Heel Slides  Right;1 set;10 reps    Knee Extension  AROM    Knee Extension Limitations  6    Knee Flexion  AROM    Knee Flexion Limitations  114      Knee/Hip Exercises: Prone   Contract/Relax to Increase Flexion  3x 5 sec conctract, 30 sec relax, Rt LE      Manual  Therapy   Manual Therapy  Myofascial release    Manual therapy comments  performed seperate from other interventions    Myofascial Release  to reduce adhesions periemter of knee prior to [facility]               PT Short Term Goals - 07/14/18 1159      PT SHORT TERM GOAL #1   Title  Pt Rt knee extension to be less than five degrees to allow more normalized gait pattern.     Time  2    Period  Weeks    Status  Achieved    Target Date  07/07/18      PT SHORT TERM GOAL #2   Title  Pt Rt knee flexion to be to 110 to allow pt to sit for an hour in comfort for meals and socializing.     Baseline  AROM 106, AAROM 110    Time  2    Period  Weeks    Status  Partially Met      PT SHORT TERM GOAL #3   Title  Pt strength to be increased by 1/2 grade to allow pt to rise from soft low lying surface, ie couch, without difficulty.     Time  2    Period  Weeks    Status  On-going      PT SHORT TERM GOAL #4   Title  Pt to be able to single leg stance for 10 seconds to allow pt to feel confident walking on uneven ground.     Time  2    Period  Weeks    Status  Achieved      PT SHORT TERM GOAL #5   Title  Pt pain in his RT knee to be no greater than a 3/10 to allow pt to sleep waking only one time throughout the night.     Time  2    Period  Weeks    Status  Achieved        PT Long Term Goals - 07/14/18 1201      PT LONG TERM GOAL #1   Title  Pt Rt knee extension to be at 0 to allow a normal gait pattern.     Time  4    Period  Weeks    Status  On-going      PT LONG TERM GOAL #2  Title  Pt Rt knee flextion to be at 120 to allow pt to be able to squat to work out in the yard.     Time  4    Period  Weeks    Status  On-going      PT LONG TERM GOAL #3   Title  Pt LE strength to be increased one grade to allow pt to ascend and descend 7 steps in a reciprocal manner.     Time  4    Period  Weeks    Status  On-going      PT LONG TERM GOAL #4   Title  Pt to be able to single  leg stance on his right leg for 20 seconds to allow pt to feel comfortable walking without his cane both in and outside.     Time  4    Period  Weeks    Status  On-going            Plan - 07/26/18 1146    Clinical Impression Statement  pt with improving flexion, however slight lapse in extension, 6-114 today.  Insured pt is still completing prone knee hang and working on extension as well as flexion.  Attempted kneeling onto airex pad, however pt unalble to position himself to allow decreased knee flexion/disomfort.  Will continue to attempt as ROM and strength improve.  Pt shown some kneeling bench options for gardening as well.  Increased step height to 6" today with cues to complete cortrolled rather than using momentum.  Able to complete stairs reciprocally, however with slight antalgia.  Less tightness noted perimeter of knee with myofascial techniques.     Personal Factors and Comorbidities  Age;Comorbidity 2    Comorbidities  HTN,DM, OA     Examination-Activity Limitations  Bend;Dressing;Squat;Stairs;Stand    Examination-Participation Restrictions  Cleaning;Driving;Yard Work    Stability/Clinical Decision Making  Stable/Uncomplicated    Rehab Potential  Good    PT Frequency  2x / week    PT Duration  2 weeks   2 additional   PT Treatment/Interventions  ADLs/Self Care Home Management;Gait training;Stair training;Functional mobility training;Therapeutic activities;Therapeutic exercise;Balance training;Patient/family education;Manual techniques    PT Next Visit Plan  Continue primary focus on ROM until WNL, manual to improve patella mobility and reduce adhesions. Progress quad strengthening for knee extension and for stair mobility. F/U on kneeling for gardening    PT Home Exercise Plan  heel raises, Single leg stance, LAQ, Q-set, supine hamstring stretch . 4/6: standing TKE with BTB, hamstring stretch standing, knee flexion stretch standing; 07/19/18 - lunge, prone quad stretch, quad set     Consulted and Agree with Plan of Care  Patient       Patient will benefit from skilled therapeutic intervention in order to improve the following deficits and impairments:  Abnormal gait, Decreased activity tolerance, Decreased balance, Decreased range of motion, Decreased strength, Increased edema, Increased fascial restricitons, Impaired flexibility, Pain, Decreased coordination  Visit Diagnosis: Muscle weakness (generalized)  Acute pain of right knee  Stiffness of right knee, not elsewhere classified  Gait abnormality     Problem List Patient Active Problem List   Diagnosis Date Noted  . Cellulitis of right knee 05/29/2018  . Benign essential hypertension 05/22/2018  . Status post right knee replacement 05/22/2018  . Constipation due to opioid therapy 05/22/2018  . Adult BMI 32.0-32.9 kg/sq m 05/22/2018  . Primary osteoarthritis of knee 05/16/2018  . Aortic atherosclerosis ([place]) 10/19/2016  . History of  left hip replacement 05/01/2013  . Prediabetes 03/26/2013  . Hyperlipidemia 03/26/2013  . Microproteinuria 03/26/2013  . Gout 03/26/2013  . Back pain 09/05/2012  . Prostate cancer ([place]) 04/20/2011   Teena Irani, PTA/CLT [phone]  Teena Irani 07/26/2018, 11:50 AM  [place], [place], [address] Phone: [phone]   Fax:  [phone]  Name: DABID GODOWN MRN: [id number] Date of Birth: 04/11/1950

## 2018-07-28 ENCOUNTER — Encounter (HOSPITAL_COMMUNITY): Payer: Self-pay

## 2018-07-28 ENCOUNTER — Other Ambulatory Visit: Payer: Self-pay

## 2018-07-28 ENCOUNTER — Ambulatory Visit (HOSPITAL_COMMUNITY): Payer: Medicare HMO

## 2018-07-28 DIAGNOSIS — M6281 Muscle weakness (generalized): Secondary | ICD-10-CM

## 2018-07-28 DIAGNOSIS — R269 Unspecified abnormalities of gait and mobility: Secondary | ICD-10-CM | POA: Diagnosis not present

## 2018-07-28 DIAGNOSIS — M25661 Stiffness of right knee, not elsewhere classified: Secondary | ICD-10-CM | POA: Diagnosis not present

## 2018-07-28 DIAGNOSIS — M25561 Pain in right knee: Secondary | ICD-10-CM | POA: Diagnosis not present

## 2018-07-28 NOTE — Therapy (Signed)
[place], [place], [address] Phone: [phone]   Fax:  [phone]  Physical Therapy Treatment  Patient Details  Name: [name] [name] MRN: [id number] Date of Birth: Jun 29, 1950 Referring Provider (PT): [name]    Encounter Date: 07/28/2018  PT End of Session - 07/28/18 0907    Visit Number  15    Number of Visits  16    Date for PT Re-Evaluation  07/23/18    Authorization Time Period  cert 0/9-4/0; second 07/19/18-08/04/18    Authorization - Visit Number  15    Authorization - Number of Visits  16    PT Start Time  0904    PT Stop Time  0942    PT Time Calculation (min)  38 min    Activity Tolerance  Patient tolerated treatment well    Behavior During Therapy  [facility] for tasks assessed/performed       Past Medical History:  Diagnosis Date  . Aortic atherosclerosis ([place]) 10/19/2016    Seen on chest x-ray July 2018  . Arthritis    right knee and hip   . Cancer [facility])    prostate cancer   . Chronic kidney disease    prostate cancer   . Diabetes mellitus without complication ([place])   . GERD (gastroesophageal reflux disease)   . Gout   . Gout   . Hypercholesteremia   . Hyperglycemia   . Hyperlipidemia   . Hypertension   . Sleep apnea   . Status post THR (total hip replacement) 04/20/13 05/01/2013   Right total hip 04/20/2013 Dr. Lydia Guiles implant     Past Surgical History:  Procedure Laterality Date  . COLONOSCOPY    . COLONOSCOPY N/A 10/22/2013   Procedure: COLONOSCOPY;  Surgeon: Danie Binder, MD;  [facility];  Service: Endoscopy;  Laterality: N/A;  1115  . HERNIA REPAIR     right inguinal hernia repair   . NASAL SEPTUM SURGERY    . OTHER SURGICAL HISTORY     deviated septum surgery   . ROBOT ASSISTED LAPAROSCOPIC RADICAL PROSTATECTOMY  04/20/2011   Procedure: ROBOTIC ASSISTED LAPAROSCOPIC RADICAL PROSTATECTOMY;  Surgeon: Malka So, MD;  Location: [facility];  Service:  Urology;  Laterality: N/A;  Bilateral lymph node dissection  . TOTAL HIP ARTHROPLASTY Right 04/20/2013   Procedure: TOTAL HIP ARTHROPLASTY;  Surgeon: Carole Civil, MD;  Location: [facility] ORS;  Service: Orthopedics;  Laterality: Right;  . TOTAL KNEE ARTHROPLASTY Right 05/16/2018   Procedure: TOTAL KNEE ARTHROPLASTY;  Surgeon: Carole Civil, MD;  Location: [facility] ORS;  Service: Orthopedics;  Laterality: Right;    There were no vitals filed for this visit.  Subjective Assessment - 07/28/18 0905    Subjective  Patient reports his "caregiver" fell yesterday and he broke his cocyx. He is at the [facility] center now and pt is doing more of the heavy housework since he is alone. Patient reports he did not get to do his exercises yesterday.     Pertinent History  OA, HTN, DM, S/P Rt THR     Limitations  Standing;Walking;Sitting;House hold activities    Patient Stated Goals  walk without the cane, be able to go up and down steps reciprocally, bend his knee better.      Currently in Pain?  No/denies        [facility] Adult PT Treatment/Exercise - 07/28/18 0001      Knee/Hip Exercises: Stretches  Active Hamstring Stretch  Right;3 reps    Active Hamstring Stretch Limitations  standing 12" step 30" holds    Knee: Self-Stretch to increase Flexion  Right;10 seconds;Limitations    Knee: Self-Stretch Limitations  10 reps      Knee/Hip Exercises: Aerobic   Stationary Bike  4 minutes seat 14, forward for full revolutions      Knee/Hip Exercises: Standing   Forward Lunges  Both;2 sets;15 reps    Forward Lunges Limitations  on 4" box, 2 HHA in // bars    Terminal Knee Extension  Right;2 sets;10 reps;Theraband    Theraband Level (Terminal Knee Extension)  Level 4 (Blue)    Lateral Step Up  Right;15 reps;Hand Hold: 0;Step Height: 4";Step Height: 6";2 sets   4" on first, 6" on second set   Forward Step Up  Right;15 reps;Hand Hold: 0;Step Height: 4";2 sets;Step Height: 6"   4" on first, 6" on second set   Other  Standing Knee Exercises  Reverse step up: 2x 15 reps, 6" step, 1 HHA      Knee/Hip Exercises: Supine   Knee Extension  AROM    Knee Extension Limitations  3    Knee Flexion  AROM    Knee Flexion Limitations  112      Manual Therapy   Manual Therapy  Myofascial release;Joint mobilization    Manual therapy comments  performed seperate from other interventions    Joint Mobilization  3x 30-45 seconds grade IV mobilization to Rt patellofemoral joint, sup/inf and med/lat    Myofascial Release  to reduce adhesions along scar        PT Short Term Goals - 07/14/18 1159      PT SHORT TERM GOAL #1   Title  Pt Rt knee extension to be less than five degrees to allow more normalized gait pattern.     Time  2    Period  Weeks    Status  Achieved    Target Date  07/07/18      PT SHORT TERM GOAL #2   Title  Pt Rt knee flexion to be to 110 to allow pt to sit for an hour in comfort for meals and socializing.     Baseline  AROM 106, AAROM 110    Time  2    Period  Weeks    Status  Partially Met      PT SHORT TERM GOAL #3   Title  Pt strength to be increased by 1/2 grade to allow pt to rise from soft low lying surface, ie couch, without difficulty.     Time  2    Period  Weeks    Status  On-going      PT SHORT TERM GOAL #4   Title  Pt to be able to single leg stance for 10 seconds to allow pt to feel confident walking on uneven ground.     Time  2    Period  Weeks    Status  Achieved      PT SHORT TERM GOAL #5   Title  Pt pain in his RT knee to be no greater than a 3/10 to allow pt to sleep waking only one time throughout the night.     Time  2    Period  Weeks    Status  Achieved        PT Long Term Goals - 07/14/18 1201      PT LONG TERM GOAL #1  Title  Pt Rt knee extension to be at 0 to allow a normal gait pattern.     Time  4    Period  Weeks    Status  On-going      PT LONG TERM GOAL #2   Title  Pt Rt knee flextion to be at 120 to allow pt to be able to squat to work  out in the yard.     Time  4    Period  Weeks    Status  On-going      PT LONG TERM GOAL #3   Title  Pt LE strength to be increased one grade to allow pt to ascend and descend 7 steps in a reciprocal manner.     Time  4    Period  Weeks    Status  On-going      PT LONG TERM GOAL #4   Title  Pt to be able to single leg stance on his right leg for 20 seconds to allow pt to feel comfortable walking without his cane both in and outside.     Time  4    Period  Weeks    Status  On-going         Plan - 07/28/18 0908    Clinical Impression Statement  Therapy session continues to focus on ROM stretches and functional strengthening to improve stair mobility and gait quality. AROM this session was 2-112 indicating some improvement into extension. He was able to advance forward, lateral, and reverse step up to 6" box with no UE assist except for reverse. Patellofemoral joint remains hypomobile and grade IV mobilization performed today in all directions to improve mobility. He will continue to benefit from skilled PT interventions to address impairments and progress towards goals.     Personal Factors and Comorbidities  Age;Comorbidity 2    Comorbidities  HTN,DM, OA     Examination-Activity Limitations  Bend;Dressing;Squat;Stairs;Stand    Examination-Participation Restrictions  Cleaning;Driving;Yard Work    Stability/Clinical Decision Making  Stable/Uncomplicated    Rehab Potential  Good    PT Frequency  2x / week    PT Duration  2 weeks   2 additional   PT Treatment/Interventions  ADLs/Self Care Home Management;Gait training;Stair training;Functional mobility training;Therapeutic activities;Therapeutic exercise;Balance training;Patient/family education;Manual techniques    PT Next Visit Plan  Continue primary focus on ROM until WNL, manual to improve patella mobility and reduce adhesions. Progress quad strengthening for knee extension and for stair mobility. F/U on kneeling for gardening    PT  Home Exercise Plan  heel raises, Single leg stance, LAQ, Q-set, supine hamstring stretch . 4/6: standing TKE with BTB, hamstring stretch standing, knee flexion stretch standing; 07/19/18 - lunge, prone quad stretch, quad set    Consulted and Agree with Plan of Care  Patient       Patient will benefit from skilled therapeutic intervention in order to improve the following deficits and impairments:  Abnormal gait, Decreased activity tolerance, Decreased balance, Decreased range of motion, Decreased strength, Increased edema, Increased fascial restricitons, Impaired flexibility, Pain, Decreased coordination  Visit Diagnosis: Muscle weakness (generalized)  Acute pain of right knee  Stiffness of right knee, not elsewhere classified  Gait abnormality     Problem List Patient Active Problem List   Diagnosis Date Noted  . Cellulitis of right knee 05/29/2018  . Benign essential hypertension 05/22/2018  . Status post right knee replacement 05/22/2018  . Constipation due to opioid therapy 05/22/2018  .  Adult BMI 32.0-32.9 kg/sq m 05/22/2018  . Primary osteoarthritis of knee 05/16/2018  . Aortic atherosclerosis ([place]) 10/19/2016  . History of left hip replacement 05/01/2013  . Prediabetes 03/26/2013  . Hyperlipidemia 03/26/2013  . Microproteinuria 03/26/2013  . Gout 03/26/2013  . Back pain 09/05/2012  . Prostate cancer ([place]) 04/20/2011    Kipp Brood, PT, DPT, [facility] Physical Therapist with [facility]  07/28/2018 11:43 AM    [place], [place], [address] Phone: [phone]   Fax:  [phone]  Name: [name] [name] MRN: [id number] Date of Birth: [birth year]

## 2018-08-02 ENCOUNTER — Other Ambulatory Visit: Payer: Self-pay

## 2018-08-02 ENCOUNTER — Ambulatory Visit (HOSPITAL_COMMUNITY): Payer: Medicare HMO

## 2018-08-02 ENCOUNTER — Encounter (HOSPITAL_COMMUNITY): Payer: Self-pay

## 2018-08-02 DIAGNOSIS — M25561 Pain in right knee: Secondary | ICD-10-CM

## 2018-08-02 DIAGNOSIS — M6281 Muscle weakness (generalized): Secondary | ICD-10-CM

## 2018-08-02 DIAGNOSIS — R269 Unspecified abnormalities of gait and mobility: Secondary | ICD-10-CM

## 2018-08-02 DIAGNOSIS — M25661 Stiffness of right knee, not elsewhere classified: Secondary | ICD-10-CM

## 2018-08-02 NOTE — Therapy (Signed)
[place], [place], [address] Phone: [phone]   Fax:  [phone]  Physical Therapy Treatment & Discharge Summary  Patient Details  Name: [name] [name] MRN: [id number] Date of Birth: 03-17-1951 Referring Provider (PT): [name]    Encounter Date: 08/02/2018   PHYSICAL THERAPY DISCHARGE SUMMARY  Visits from Start of Care: 16  Current functional level related to goals / functional outcomes: Re-assessment performed this session and patient has progressed well since initial evaluation. His AROM this date was limited form 2-111 and his bil LE strength has improved by 1 grade or more for limited groups in bil LE's. He has initiated walking at home without [facility] and reports some increased soreness with this and was educated to continue using cane on step for safety. He has reported being able to get back into his garden and walk in the yard without difficulty. Patient has demonstrated compliance and competence with all exercises and is ready to be discharged to independent home program.    Remaining deficits: See below details   Education / Equipment: Educated on HEP throughout therapy, educated on benefits of walking program and riding a bike to maintain ROM. Discussed readiness for discharge from therapy with advanced HEP and patient agreeable. Educated on half kneel to stand transfer with external support to improve posture while working in garden.   Plan: Patient agrees to discharge.  Patient goals were partially met. Patient is being discharged due to meeting the stated rehab goals.  ?????       PT End of Session - 08/02/18 0859    Visit Number  16    Number of Visits  16    Date for PT Re-Evaluation  07/23/18    Authorization Time Period  cert 6/5-0/3; second 07/19/18-08/04/18    Authorization - Visit Number  16    Authorization - Number of Visits  16    PT Start Time  5465    PT Stop Time  0934     PT Time Calculation (min)  39 min    Activity Tolerance  Patient tolerated treatment well    Behavior During Therapy  [facility] for tasks assessed/performed       Past Medical History:  Diagnosis Date  . Aortic atherosclerosis ([place]) 10/19/2016    Seen on chest x-ray July 2018  . Arthritis    right knee and hip   . Cancer [facility])    prostate cancer   . Chronic kidney disease    prostate cancer   . Diabetes mellitus without complication ([place])   . GERD (gastroesophageal reflux disease)   . Gout   . Gout   . Hypercholesteremia   . Hyperglycemia   . Hyperlipidemia   . Hypertension   . Sleep apnea   . Status post THR (total hip replacement) 04/20/13 05/01/2013   Right total hip 04/20/2013 Dr. Lydia Guiles implant     Past Surgical History:  Procedure Laterality Date  . COLONOSCOPY    . COLONOSCOPY N/A 10/22/2013   Procedure: COLONOSCOPY;  Surgeon: Danie Binder, MD;  [facility];  Service: Endoscopy;  Laterality: N/A;  1115  . HERNIA REPAIR     right inguinal hernia repair   . NASAL SEPTUM SURGERY    . OTHER SURGICAL HISTORY     deviated septum surgery   . ROBOT ASSISTED LAPAROSCOPIC RADICAL PROSTATECTOMY  04/20/2011   Procedure: ROBOTIC ASSISTED LAPAROSCOPIC RADICAL PROSTATECTOMY;  Surgeon: Malka So,  MD;  Location[facility];  Service: Urology;  Laterality: N/A;  Bilateral lymph node dissection  . TOTAL HIP ARTHROPLASTY Right 04/20/2013   Procedure: TOTAL HIP ARTHROPLASTY;  Surgeon: Carole Civil, MD;  Location: [facility];  Service: Orthopedics;  Laterality: Right;  . TOTAL KNEE ARTHROPLASTY Right 05/16/2018   Procedure: TOTAL KNEE ARTHROPLASTY;  Surgeon: Carole Civil, MD;  Location: [facility];  Service: Orthopedics;  Laterality: Right;    There were no vitals filed for this visit.  Subjective Assessment - 08/02/18 0856    Subjective  Patient feels like he has made great progress since he first started. He reports he is still doing his HEP 2-3 times a day and he  is walking a couple of hours a day. He reports yesterday after he walked for 2+ hours and did exercises and iced it was at about 5-6/10. He felt like he pulled something on the side of his Rt hip and it is sore but has already started to improve.    Pertinent History  OA, HTN, DM, S/P Rt THR     Limitations  Standing;Walking;Sitting;House hold activities    Patient Stated Goals  walk without the cane, be able to go up and down steps reciprocally, bend his knee better.      Currently in Pain?  No/denies         [facility] PT Assessment - 08/02/18 0001      Assessment   Medical Diagnosis  Rt TKR    Referring Provider (PT)  [name]     Onset Date/Surgical Date  05/16/18    Next MD Visit  08/07/18    Prior Therapy  SNF, HH      Precautions   Precautions  None      Restrictions   Weight Bearing Restrictions  No      Prior Function   Level of Independence  Independent    Vocation  Retired    Photographer, walks       Cognition   Overall Cognitive Status  Within Functional Limits for tasks assessed      Observation/Other Assessments   Focus on Therapeutic Outcomes (FOTO)   45% limited   was 42% limited     Single Leg Stance   Comments  LT: 30";  RT: 30" (with compensation of Lt leg pressed against Rt)      Sit to Stand   Comments  --      AROM   Right Knee Extension  3    Right Knee Flexion  111      Strength   Right Hip Flexion  5/5    Right Hip Extension  4-/5   3-   Right Hip ABduction  4/5   3-   Left Hip Flexion  5/5    Left Hip Extension  4-/5   3   Left Hip ABduction  4+/5    Right/Left Knee  Right;Left    Right Knee Flexion  4+/5   4-   Right Knee Extension  4+/5   3+   Left Knee Flexion  4+/5    Left Knee Extension  5/5    Right Ankle Dorsiflexion  5/5      Transfers   Five time sit to stand comments   10.4 without UE use   was 18.23     Ambulation/Gait   Ambulation/Gait  Yes    Assistive device  None    Gait Pattern  Right flexed knee  in stance;Antalgic   pt reported no pain in knee and slight soreness in hip   Ambulation Surface  Level;Indoor        PT Education - 08/02/18 0858    Education Details  Educated on HEP throughout therapy, educated on benefits of walking program and riding a bike to maintain ROM. Discussed readiness for discharge from therapy with advanced HEP and patient agreeable. Educated on half kneel to stand transfer with external support to improve posture while working in garden.     Person(s) Educated  Patient    Methods  Explanation    Comprehension  Verbalized understanding;Returned demonstration       PT Short Term Goals - 08/02/18 1513      PT SHORT TERM GOAL #1   Title  Pt Rt knee extension to be less than five degrees to allow more normalized gait pattern.     Time  2    Period  Weeks    Status  Achieved    Target Date  07/07/18      PT SHORT TERM GOAL #2   Title  Pt Rt knee flexion to be to 110 to allow pt to sit for an hour in comfort for meals and socializing.     Time  2    Period  Weeks    Status  Achieved      PT SHORT TERM GOAL #3   Title  Pt strength to be increased by 1/2 grade to allow pt to rise from soft low lying surface, ie couch, without difficulty.     Time  2    Period  Weeks    Status  Achieved      PT SHORT TERM GOAL #4   Title  Pt to be able to single leg stance for 10 seconds to allow pt to feel confident walking on uneven ground.     Time  2    Period  Weeks    Status  Achieved      PT SHORT TERM GOAL #5   Title  Pt pain in his RT knee to be no greater than a 3/10 to allow pt to sleep waking only one time throughout the night.     Time  2    Period  Weeks    Status  Achieved        PT Long Term Goals - 08/02/18 1514      PT LONG TERM GOAL #1   Title  Pt Rt knee extension to be at 0 to allow a normal gait pattern.     Baseline  2-111    Time  4    Period  Weeks    Status  Not Met      PT LONG TERM GOAL #2   Title  Pt Rt knee flextion to  be at 120 to allow pt to be able to squat to work out in the yard.     Baseline  2-111    Time  4    Period  Weeks    Status  Not Met      PT LONG TERM GOAL #3   Title  Pt LE strength to be increased one grade to allow pt to ascend and descend 7 steps in a reciprocal manner.     Baseline  pt demos good form and recipricol step pattern with SPC on stairs    Time  4    Period  Weeks    Status  Partially Met      PT LONG TERM GOAL #4   Title  Pt to be able to single leg stance on his right leg for 20 seconds to allow pt to feel comfortable walking without his cane both in and outside.     Time  4    Period  Weeks    Status  Partially Met            Plan - 08/02/18 1255    Clinical Impression Statement  Re-assessment performed this session and patient has progressed well since initial evaluation. His AROM this date was limited form 2-111 and his bil LE strength has improved by 1 grade or more for limited groups in bil LE's. He has initiated walking at home without [facility] and reports some increased soreness with this and was educated to continue using cane on step for safety. He has reported being able to get back into his garden and walk in the yard without difficulty. Patient has demonstrated compliance and competence with all exercises and is ready to be discharged to independent home program.     Personal Factors and Comorbidities  Age;Comorbidity 2    Comorbidities  HTN,DM, OA     Examination-Activity Limitations  Bend;Dressing;Squat;Stairs;Stand    Examination-Participation Restrictions  Cleaning;Driving;Yard Work    Stability/Clinical Decision Making  Stable/Uncomplicated    Rehab Potential  Good    PT Frequency  2x / week    PT Duration  2 weeks   2 additional   PT Treatment/Interventions  ADLs/Self Care Home Management;Gait training;Stair training;Functional mobility training;Therapeutic activities;Therapeutic exercise;Balance training;Patient/family education;Manual techniques     PT Next Visit Plan  discharge to HEP    PT Home Exercise Plan  heel raises, Single leg stance, LAQ, Q-set, supine hamstring stretch . 4/6: standing TKE with BTB, hamstring stretch standing, knee flexion stretch standing; 07/19/18 - lunge, prone quad stretch, quad set    Consulted and Agree with Plan of Care  Patient       Patient will benefit from skilled therapeutic intervention in order to improve the following deficits and impairments:  Abnormal gait, Decreased activity tolerance, Decreased balance, Decreased range of motion, Decreased strength, Increased edema, Increased fascial restricitons, Impaired flexibility, Pain, Decreased coordination  Visit Diagnosis: Muscle weakness (generalized)  Acute pain of right knee  Stiffness of right knee, not elsewhere classified  Gait abnormality     Problem List Patient Active Problem List   Diagnosis Date Noted  . Cellulitis of right knee 05/29/2018  . Benign essential hypertension 05/22/2018  . Status post right knee replacement 05/22/2018  . Constipation due to opioid therapy 05/22/2018  . Adult BMI 32.0-32.9 kg/sq m 05/22/2018  . Primary osteoarthritis of knee 05/16/2018  . Aortic atherosclerosis ([place]) 10/19/2016  . History of left hip replacement 05/01/2013  . Prediabetes 03/26/2013  . Hyperlipidemia 03/26/2013  . Microproteinuria 03/26/2013  . Gout 03/26/2013  . Back pain 09/05/2012  . Prostate cancer ([place]) 04/20/2011    Kipp Brood, PT, DPT, [facility] Physical Therapist with [facility]  08/02/2018 3:16 PM    [place], [place], [address] Phone: [phone]   Fax:  [phone]  Name: [name] [name] MRN: [id number] Date of Birth: 1950-10-31

## 2018-08-04 ENCOUNTER — Ambulatory Visit (HOSPITAL_COMMUNITY): Payer: Medicare HMO

## 2018-08-07 ENCOUNTER — Ambulatory Visit (INDEPENDENT_AMBULATORY_CARE_PROVIDER_SITE_OTHER): Payer: Medicare HMO | Admitting: Orthopedic Surgery

## 2018-08-07 ENCOUNTER — Encounter: Payer: Self-pay | Admitting: Orthopedic Surgery

## 2018-08-07 ENCOUNTER — Other Ambulatory Visit: Payer: Self-pay

## 2018-08-07 VITALS — Temp 97.3°F

## 2018-08-07 DIAGNOSIS — Z96651 Presence of right artificial knee joint: Secondary | ICD-10-CM

## 2018-08-07 NOTE — Progress Notes (Signed)
Chief Complaint  Patient presents with  . Routine Post Op    05/16/18 right knee replacement     Ethin is here for routine follow-up 3 months after right total knee he is also had a right total hip back in 2015  He is doing very well he is walking without a cane he can kneel to garden but does not like too much pressure on the knee  His final range of motion is 2-111 degrees and he demonstrates that in the office  There are no signs of infection  He is progressing well  Encounter Diagnosis  Name Primary?  . Status post right knee replacement 05/16/18 Yes     Follow-up in 3 months

## 2018-08-09 ENCOUNTER — Encounter (HOSPITAL_COMMUNITY): Payer: PRIVATE HEALTH INSURANCE

## 2018-08-11 ENCOUNTER — Encounter (HOSPITAL_COMMUNITY): Payer: PRIVATE HEALTH INSURANCE | Admitting: Physical Therapy

## 2018-08-16 ENCOUNTER — Encounter (HOSPITAL_COMMUNITY): Payer: PRIVATE HEALTH INSURANCE | Admitting: Physical Therapy

## 2018-08-18 ENCOUNTER — Encounter (HOSPITAL_COMMUNITY): Payer: PRIVATE HEALTH INSURANCE | Admitting: Physical Therapy

## 2018-10-02 NOTE — Progress Notes (Signed)
Cardiology Office Note   Date:  10/06/2018   ID:  [name] [name], DOB [birth year], MRN [id number]  PCP:  Kathyrn Drown, MD  Cardiologist:   Jenkins Rouge, MD   No chief complaint on file.     History of Present Illness: [name] [name] is a 68 y.o. male who presents for F/U . Referred by Dr Wolfgang Phoenix He sees Dr Aline Brochure for his orthopedic needs. Had right THR in 2015 with no cardiac issues. Seen 04/10/18 to clear for right TKR History of DM, HTN and HLD. CXR done 10/19/16 commented on aortic atherosclerosis.   Myovue 04/07/18 reviewed low risk diaphragmatic attenuation EF 60% Had uneventful TKR 05/16/18  Carotid 04/17/18 plaque no stenosis   No cardiac complaints Roommate fell and hurt coccyx and he was patients care giver so things were Hard for a while   Past Medical History:  Diagnosis Date  . Aortic atherosclerosis ([place]) 10/19/2016    Seen on chest x-ray July 2018  . Arthritis    right knee and hip   . Cancer [facility])    prostate cancer   . Chronic kidney disease    prostate cancer   . Diabetes mellitus without complication ([place])   . GERD (gastroesophageal reflux disease)   . Gout   . Gout   . Hypercholesteremia   . Hyperglycemia   . Hyperlipidemia   . Hypertension   . Sleep apnea   . Status post THR (total hip replacement) 04/20/13 05/01/2013   Right total hip 04/20/2013 Dr. Lydia Guiles implant     Past Surgical History:  Procedure Laterality Date  . COLONOSCOPY    . COLONOSCOPY N/A 10/22/2013   Procedure: COLONOSCOPY;  Surgeon: Danie Binder, MD;  Location: [facility] END[facility] SUITE;  Service: Endoscopy;  Laterality: N/A;  1115  . HERNIA REPAIR     right inguinal hernia repair   . NASAL SEPTUM SURGERY    . OTHER SURGICAL HISTORY     deviated septum surgery   . ROBOT ASSISTED LAPAROSCOPIC RADICAL PROSTATECTOMY  04/20/2011   Procedure: ROBOTIC ASSISTED LAPAROSCOPIC RADICAL PROSTATECTOMY;  Surgeon: Malka So, MD;  Location: [facility] ORS;  Service: Urology;  Laterality: N/A;   Bilateral lymph node dissection  . TOTAL HIP ARTHROPLASTY Right 04/20/2013   Procedure: TOTAL HIP ARTHROPLASTY;  Surgeon: Carole Civil, MD;  Location: [facility] ORS;  Service: Orthopedics;  Laterality: Right;  . TOTAL KNEE ARTHROPLASTY Right 05/16/2018   Procedure: TOTAL KNEE ARTHROPLASTY;  Surgeon: Carole Civil, MD;  Location: [facility] ORS;  Service: Orthopedics;  Laterality: Right;     Current Outpatient Medications  Medication Sig Dispense Refill  . allopurinol (ZYLOPRIM) 100 MG tablet Take 1 tablet (100 mg total) by mouth daily. 30 tablet 0  . amLODipine (NORVASC) 5 MG tablet Take 1 tablet (5 mg total) by mouth daily. 90 tablet 1  . aspirin 81 MG chewable tablet Chew 81 mg by mouth daily.     [name] atorvastatin (LIPITOR) 40 MG tablet Take 1 tablet (40 mg total) by mouth at bedtime. 30 tablet 0  . B Complex-C (SUPER B COMPLEX PO) Take 1 tablet by mouth daily.    . hydrochlorothiazide (HYDRODIURIL) 25 MG tablet Take 1 tablet (25 mg total) by mouth daily. 90 tablet 1  . Multiple Vitamin (MULTIVITAMIN) tablet Take 1 tablet by mouth daily.     . potassium chloride (K-DUR) 10 MEQ tablet Take 1 tablet (10 mEq total) by mouth daily. 90 tablet 1   No  current facility-administered medications for this visit.     Allergies:   Lipitor [atorvastatin] and Lisinopril    Social History:  The patient  reports that he quit smoking about 34 years ago. His smoking use included cigarettes. He has a 4.00 pack-year smoking history. He has never used smokeless tobacco. He reports current alcohol use. He reports that he does not use drugs.   Family History:  The patient's family history includes Diabetes in his mother; Hypertension in his brother and mother.    ROS:  Please see the history of present illness.   Otherwise, review of systems are positive for none.   All other systems are reviewed and negative.    PHYSICAL EXAM: VS:  BP 132/67   Pulse 83   Temp 97.7 F (36.5 C)   Ht 5\' 7"  (1.702 m)   Wt 201  lb (91.2 kg)   BMI 31.48 kg/m  , BMI Body mass index is 31.48 kg/m. Affect appropriate Healthy:  appears stated age 66: normal Neck supple with no adenopathy JVP normal left  bruits no thyromegaly Lungs clear with no wheezing and good diaphragmatic motion Heart:  S1/S2 SEM  murmur, no rub, gallop or click PMI normal Abdomen: benighn, BS positve, no tenderness, no AAA no bruit.  No HSM or HJR Distal pulses intact with no bruits No edema Neuro non-focal Skin warm and dry Post right THR    EKG:  2015 SR rate 93 nonspecific ST changes 04/10/18 SR rate 72 normal    Recent Labs: 10/17/2017: ALT 16 07/13/2018: BUN 11; Creatinine, Ser 1.09; Hemoglobin 14.7; Platelets 307; Potassium 4.4; Sodium 137    Lipid Panel    Component Value Date/Time   CHOL 158 04/05/2018 0820   TRIG 94 04/05/2018 0820   HDL 47 04/05/2018 0820   CHOLHDL 3.4 04/05/2018 0820   CHOLHDL 4.7 12/05/2013 0705   VLDL 48 (H) 12/05/2013 0705   LDLCALC 92 04/05/2018 0820      Wt Readings from Last 3 Encounters:  10/06/18 201 lb (91.2 kg)  05/31/18 204 lb 12.8 oz (92.9 kg)  05/29/18 202 lb (91.6 kg)      Other studies Reviewed: Additional studies/ records that were reviewed today include: Notes from ortho Dr Aline Brochure Notes from primary Dr Wolfgang Phoenix ECG, [place] labs .    ASSESSMENT AND PLAN:  1. Ortho:  Post right THR 2015 and right TKR 04/2018 improved f/u Dr Aline Brochure  2. HTN: Well controlled.  Continue current medications and low sodium Dash type diet.   3. HLD:  Continue statin labs with primary  4. DM: Discussed low carb diet.  A1c borderline 5.7 on no meds  5. Bruit:  Left sided duplex January 2020 plaque no stenosis  6. Murmur:  Benign SEM no need for TTE    Current medicines are reviewed at length with the patient today.  The patient does not have concerns regarding medicines.  The following changes have been made:  no change  Labs/ tests ordered today include: None   No orders of the defined  types were placed in this encounter.    Disposition:   FU with cardiology PRN     Signed, Jenkins Rouge, MD  10/06/2018 9:48 AM    [address], [place], [place]  [address] Phone: [phone]; Fax: [phone]

## 2018-10-04 ENCOUNTER — Telehealth: Payer: Self-pay | Admitting: Family Medicine

## 2018-10-04 NOTE — Telephone Encounter (Signed)
Patient notified

## 2018-10-04 NOTE — Telephone Encounter (Signed)
Last labs 4/20: Lipid, liver, met 7, cbc, psa. hgbA1c

## 2018-10-04 NOTE — Telephone Encounter (Signed)
It appears from looking at the record that all of the labs were ordered in April He just needs to go to [facility] to get this completed Please inform patient he can do it any morning he chooses Monday through Friday

## 2018-10-04 NOTE — Telephone Encounter (Signed)
Pt has physical here 10/30/2018 - needs lab work ordered  Please advise & call pt when ready

## 2018-10-06 ENCOUNTER — Other Ambulatory Visit: Payer: Self-pay

## 2018-10-06 ENCOUNTER — Encounter: Payer: Self-pay | Admitting: Cardiovascular Disease

## 2018-10-06 ENCOUNTER — Ambulatory Visit (INDEPENDENT_AMBULATORY_CARE_PROVIDER_SITE_OTHER): Payer: Medicare HMO | Admitting: Cardiovascular Disease

## 2018-10-06 VITALS — BP 132/67 | HR 83 | Temp 97.7°F | Ht 67.0 in | Wt 201.0 lb

## 2018-10-06 DIAGNOSIS — I10 Essential (primary) hypertension: Secondary | ICD-10-CM

## 2018-10-06 NOTE — Patient Instructions (Signed)
Medication Instructions:  Your physician recommends that you continue on your current medications as directed. Please refer to the Current Medication list given to you today.   Labwork: NONE  Testing/Procedures: NONE  Follow-Up: Your physician recommends that you schedule a follow-up appointment in: AS NEEDED      Any Other Special Instructions Will Be Listed Below (If Applicable).     If you need a refill on your cardiac medications before your next appointment, please call your pharmacy.   

## 2018-10-18 DIAGNOSIS — E291 Testicular hypofunction: Secondary | ICD-10-CM | POA: Diagnosis not present

## 2018-10-23 DIAGNOSIS — Z125 Encounter for screening for malignant neoplasm of prostate: Secondary | ICD-10-CM | POA: Diagnosis not present

## 2018-10-23 DIAGNOSIS — E785 Hyperlipidemia, unspecified: Secondary | ICD-10-CM | POA: Diagnosis not present

## 2018-10-23 DIAGNOSIS — I10 Essential (primary) hypertension: Secondary | ICD-10-CM | POA: Diagnosis not present

## 2018-10-23 DIAGNOSIS — R7303 Prediabetes: Secondary | ICD-10-CM | POA: Diagnosis not present

## 2018-10-24 LAB — LIPID PANEL
Chol/HDL Ratio: 3.2 ratio (ref 0.0–5.0)
Cholesterol, Total: 150 mg/dL (ref 100–199)
HDL: 47 mg/dL (ref >39)
LDL Calculated: 85 mg/dL (ref 0–99)
Triglycerides: 89 mg/dL (ref 0–149)
VLDL Cholesterol Cal: 18 mg/dL (ref 5–40)

## 2018-10-24 LAB — HEMOGLOBIN A1C
Est. average glucose Bld gHb Est-mCnc: 114 mg/dL
Hgb A1c MFr Bld: 5.6 % (ref 4.8–5.6)

## 2018-10-24 LAB — HEPATIC FUNCTION PANEL
ALT: 14 IU/L (ref 0–44)
AST: 16 IU/L (ref 0–40)
Albumin: 4.3 g/dL (ref 3.8–4.8)
Alkaline Phosphatase: 96 IU/L (ref 39–117)
Bilirubin Total: 0.6 mg/dL (ref 0.0–1.2)
Bilirubin, Direct: 0.16 mg/dL (ref 0.00–0.40)
Total Protein: 6.4 g/dL (ref 6.0–8.5)

## 2018-10-24 LAB — BASIC METABOLIC PANEL
BUN/Creatinine Ratio: 17 (ref 10–24)
BUN: 18 mg/dL (ref 8–27)
CO2: 26 mmol/L (ref 20–29)
Calcium: 8.6 mg/dL (ref 8.6–10.2)
Chloride: 98 mmol/L (ref 96–106)
Creatinine, Ser: 1.07 mg/dL (ref 0.76–1.27)
GFR calc Af Amer: 81 mL/min/{1.73_m2} (ref >59)
GFR calc non Af Amer: 70 mL/min/{1.73_m2} (ref >59)
Glucose: 105 mg/dL — ABNORMAL HIGH (ref 65–99)
Potassium: 4.6 mmol/L (ref 3.5–5.2)
Sodium: 136 mmol/L (ref 134–144)

## 2018-10-24 LAB — PSA: Prostate Specific Ag, Serum: <0.1 ng/mL (ref 0.0–4.0)

## 2018-10-26 ENCOUNTER — Encounter: Payer: Medicare HMO | Admitting: Family Medicine

## 2018-10-27 ENCOUNTER — Ambulatory Visit (INDEPENDENT_AMBULATORY_CARE_PROVIDER_SITE_OTHER): Payer: Medicare HMO | Admitting: Urology

## 2018-10-27 DIAGNOSIS — E291 Testicular hypofunction: Secondary | ICD-10-CM

## 2018-10-27 DIAGNOSIS — N3941 Urge incontinence: Secondary | ICD-10-CM

## 2018-10-27 DIAGNOSIS — Z8546 Personal history of malignant neoplasm of prostate: Secondary | ICD-10-CM

## 2018-10-27 DIAGNOSIS — N5231 Erectile dysfunction following radical prostatectomy: Secondary | ICD-10-CM

## 2018-10-30 ENCOUNTER — Ambulatory Visit (INDEPENDENT_AMBULATORY_CARE_PROVIDER_SITE_OTHER): Payer: Medicare HMO | Admitting: Family Medicine

## 2018-10-30 ENCOUNTER — Encounter: Payer: Self-pay | Admitting: Family Medicine

## 2018-10-30 ENCOUNTER — Other Ambulatory Visit: Payer: Self-pay

## 2018-10-30 VITALS — BP 126/68 | Temp 97.6°F | Ht 67.0 in | Wt 201.0 lb

## 2018-10-30 DIAGNOSIS — E785 Hyperlipidemia, unspecified: Secondary | ICD-10-CM

## 2018-10-30 DIAGNOSIS — Z0000 Encounter for general adult medical examination without abnormal findings: Secondary | ICD-10-CM | POA: Diagnosis not present

## 2018-10-30 DIAGNOSIS — R7303 Prediabetes: Secondary | ICD-10-CM | POA: Diagnosis not present

## 2018-10-30 MED ORDER — ATORVASTATIN CALCIUM 40 MG PO TABS: 40.0000 mg | ORAL_TABLET | Freq: Every day | ORAL | 1 refills | Status: DC

## 2018-10-30 MED ORDER — CITALOPRAM HYDROBROMIDE 20 MG PO TABS: 20.0000 mg | ORAL_TABLET | Freq: Every day | ORAL | 1 refills | Status: DC

## 2018-10-30 NOTE — Patient Instructions (Addendum)
Thank you for coming for your annual wellness visit.  Please follow through on any advice that was given to you by today's visit. Remember to maintain compliance with your medications as discussed today.  Also remember it is important to eat a healthy diet and to stay physically active on a daily basis.  Please follow through with any testing or recommended followup office visits as was discussed today. You are due the following test coming up:  ALL- Colonoscopy-2025            Vaccines-flu this fall          Finally remembered that the annual wellness visit does not take the place of regularly scheduled office visits  chronic health problems such as hypertension/diabetes/cholesterol visits.

## 2018-10-30 NOTE — Progress Notes (Signed)
Subjective:    Patient ID: [name] [name], male    DOB: 1951-03-12, 68 y.o.   MRN: [id number]  HPI  The patient comes in today for a wellness visit.  Patient had recent lab work Is due for urine ACR   A review of their health history was completed.  A review of medications was also completed.  Any needed refills; yes  Eating habits: eats very healthy  Falls/  MVA accidents in past few months: none  Regular exercise: stretching and strengthening   Specialist pt sees on regular basis: Dr Jeffie Pollock -urologist and Dr Adron Bene Orthopedist   Preventative health issues were discussed.   Additional concerns:Discuss getting a week or so of Celexa to help with some pandemic stress/blue days.   Review of Systems  Constitutional: Negative for activity change, appetite change and fever.  HENT: Negative for congestion and rhinorrhea.   Eyes: Negative for discharge.  Respiratory: Negative for cough and wheezing.   Cardiovascular: Negative for chest pain.  Gastrointestinal: Negative for abdominal pain, blood in stool and vomiting.  Genitourinary: Negative for difficulty urinating and frequency.  Musculoskeletal: Negative for neck pain.  Skin: Negative for rash.  Allergic/Immunologic: Negative for environmental allergies and food allergies.  Neurological: Negative for weakness and headaches.  Psychiatric/Behavioral: Negative for agitation.       Objective:   Physical Exam Constitutional:      Appearance: He is well-developed.  HENT:     Head: Normocephalic and atraumatic.     Right Ear: External ear normal.     Left Ear: External ear normal.     Nose: Nose normal.  Eyes:     Pupils: Pupils are equal, round, and reactive to light.  Neck:     Musculoskeletal: Normal range of motion and neck supple.     Thyroid: No thyromegaly.  Cardiovascular:     Rate and Rhythm: Normal rate and regular rhythm.     Heart sounds: Normal heart sounds. No murmur.  Pulmonary:     Effort:  Pulmonary effort is normal. No respiratory distress.     Breath sounds: Normal breath sounds. No wheezing.  Abdominal:     General: Bowel sounds are normal. There is no distension.     Palpations: Abdomen is soft. There is no mass.     Tenderness: There is no abdominal tenderness.  Genitourinary:    Penis: Normal.   Musculoskeletal: Normal range of motion.  Lymphadenopathy:     Cervical: No cervical adenopathy.  Skin:    General: Skin is warm and dry.     Findings: No erythema.  Neurological:     Mental Status: He is alert.     Motor: No abnormal muscle tone.  Psychiatric:        Behavior: Behavior normal.        Judgment: Judgment normal.    Results for orders placed or performed in visit on 07/19/18  Lipid panel  Result Value Ref Range   Cholesterol, Total 150 100 - 199 mg/dL   Triglycerides 89 0 - 149 mg/dL   HDL 47 >39 mg/dL   VLDL Cholesterol Cal 18 5 - 40 mg/dL   LDL Calculated 85 0 - 99 mg/dL   Chol/HDL Ratio 3.2 0.0 - 5.0 ratio  Hepatic function panel  Result Value Ref Range   Total Protein 6.4 6.0 - 8.5 g/dL   Albumin 4.3 3.8 - 4.8 g/dL   Bilirubin Total 0.6 0.0 - 1.2 mg/dL   Bilirubin, Direct 0.16 0.00 -  0.40 mg/dL   Alkaline Phosphatase 96 39 - 117 IU/L   AST 16 0 - 40 IU/L   ALT 14 0 - 44 IU/L  Basic metabolic panel  Result Value Ref Range   Glucose 105 (H) 65 - 99 mg/dL   BUN 18 8 - 27 mg/dL   Creatinine, Ser 1.07 0.76 - 1.27 mg/dL   GFR calc non Af Amer 70 >59 mL/min/1.73   GFR calc Af Amer 81 >59 mL/min/1.73   BUN/Creatinine Ratio 17 10 - 24   Sodium 136 134 - 144 mmol/L   Potassium 4.6 3.5 - 5.2 mmol/L   Chloride 98 96 - 106 mmol/L   CO2 26 20 - 29 mmol/L   Calcium 8.6 8.6 - 10.2 mg/dL  PSA  Result Value Ref Range   Prostate Specific Ag, Serum <0.1 0.0 - 4.0 ng/mL  Hemoglobin A1c  Result Value Ref Range   Hgb A1c MFr Bld 5.6 4.8 - 5.6 %   Est. average glucose Bld gHb Est-mCnc 114 mg/dL    Patient does complain of burning and tingling in  the bottom of his feet.  He does have underlying diabetes.  Sugars been under decent control.  Watches diet stays active.  The burning occurs when he is on his feet more than a few minutes.  This is new to him.  He is concerned about it.  Denies any previous history of it.  Is not tried anything for it.  He does try to get off his feet at times and it helps a little bit.  Patient for blood pressure check up.  The patient does have hypertension.  The patient is on medication.  Patient relates compliance with meds. Todays BP reviewed with the patient. Patient denies issues with medication. Patient relates reasonable diet. Patient tries to minimize salt. Patient aware of BP goals.  Patient has history of prostate cancer follows with urology regular basis PSA was taken today in his looks really good  History of gout uric acid under good control watches diet closely.  15 minutes was spent with patient today discussing healthcare issues which they came.  More than 50% of this visit-total duration of visit-was spent in counseling and coordination of care.  Please see diagnosis regarding the focus of this coordination and care  Foot exam normal except for some mild monofilament inability to feel in the right foot and some in the left foot consistent with possible neuropathy     Assessment & Plan:  Adult wellness-complete.wellness physical was conducted today. Importance of diet and exercise were discussed in detail.  In addition to this a discussion regarding safety was also covered. We also reviewed over immunizations and gave recommendations regarding current immunization needed for age.  In addition to this additional areas were also touched on including: Preventative health exams needed:  Colonoscopy 2025  Patient was advised yearly wellness exam  We also addressed neuropathic symptoms in his feet I think it is related to diabetes I would not recommend medication this was discussed with the  patient has a separate to the adult wellness.  It should be noted that his cholesterols mildly up compared to previous I encourage patient to do better on diet he states he has been taking in significant amount of red meat lately and he states he will clean up his diet  Patient follows up with urology on a regular basis because of prostate cancer I would recommend for this patient to follow-up in the early spring Flu  shot in the fall

## 2018-10-31 LAB — MICROALBUMIN / CREATININE URINE RATIO
Creatinine, Urine: 223.5 mg/dL
Microalb/Creat Ratio: 6 mg/g creat (ref 0–29)
Microalbumin, Urine: 13.4 ug/mL

## 2018-11-08 ENCOUNTER — Ambulatory Visit: Payer: Medicare HMO | Admitting: Orthopedic Surgery

## 2018-11-15 ENCOUNTER — Ambulatory Visit: Payer: Medicare HMO | Admitting: Orthopedic Surgery

## 2018-11-16 ENCOUNTER — Ambulatory Visit: Payer: Medicare HMO | Admitting: Orthopedic Surgery

## 2018-11-20 ENCOUNTER — Other Ambulatory Visit: Payer: Self-pay | Admitting: Family Medicine

## 2018-12-01 ENCOUNTER — Other Ambulatory Visit: Payer: Self-pay | Admitting: Family Medicine

## 2018-12-06 ENCOUNTER — Other Ambulatory Visit: Payer: Self-pay

## 2018-12-06 ENCOUNTER — Ambulatory Visit (INDEPENDENT_AMBULATORY_CARE_PROVIDER_SITE_OTHER): Payer: Medicare HMO | Admitting: Orthopedic Surgery

## 2018-12-06 ENCOUNTER — Encounter: Payer: Self-pay | Admitting: Orthopedic Surgery

## 2018-12-06 VITALS — BP 150/67 | HR 72 | Ht 68.0 in | Wt 199.0 lb

## 2018-12-06 DIAGNOSIS — Z96651 Presence of right artificial knee joint: Secondary | ICD-10-CM | POA: Diagnosis not present

## 2018-12-06 NOTE — Progress Notes (Signed)
Chief Complaint  Patient presents with  . Post-op Follow-up    right knee replacement 05/16/18   Right knee replacement on February 2020 doing well has some discomfort going up the steps but otherwise is doing well he has returned to work with no issues  He did want to discuss tingling in his hand on the bottom of his feet it seems to be intermittent seems to be improving I think the neurology consult would be good if this continues  He also wanted to know about his sciatica which flared up and lasted 10 days we talked about using ibuprofen steroids gabapentin if this returns  Encounter Diagnosis  Name Primary?  . Status post right knee replacement 05/16/18 Yes   Follow-up in February x-ray right knee and right hip

## 2018-12-21 DIAGNOSIS — Z0100 Encounter for examination of eyes and vision without abnormal findings: Secondary | ICD-10-CM | POA: Diagnosis not present

## 2018-12-21 DIAGNOSIS — H5200 Hypermetropia, unspecified eye: Secondary | ICD-10-CM | POA: Diagnosis not present

## 2019-01-03 ENCOUNTER — Other Ambulatory Visit: Payer: Self-pay | Admitting: Family Medicine

## 2019-01-09 ENCOUNTER — Other Ambulatory Visit: Payer: Self-pay

## 2019-01-09 ENCOUNTER — Other Ambulatory Visit (INDEPENDENT_AMBULATORY_CARE_PROVIDER_SITE_OTHER): Payer: Medicare HMO | Admitting: *Deleted

## 2019-01-09 DIAGNOSIS — Z23 Encounter for immunization: Secondary | ICD-10-CM | POA: Diagnosis not present

## 2019-03-09 ENCOUNTER — Other Ambulatory Visit: Payer: Self-pay

## 2019-03-09 DIAGNOSIS — E291 Testicular hypofunction: Secondary | ICD-10-CM

## 2019-03-09 DIAGNOSIS — Z8546 Personal history of malignant neoplasm of prostate: Secondary | ICD-10-CM

## 2019-03-21 ENCOUNTER — Other Ambulatory Visit: Payer: Self-pay

## 2019-03-21 DIAGNOSIS — Z8546 Personal history of malignant neoplasm of prostate: Secondary | ICD-10-CM

## 2019-04-02 ENCOUNTER — Telehealth: Payer: Self-pay | Admitting: Family Medicine

## 2019-04-02 ENCOUNTER — Other Ambulatory Visit: Payer: Self-pay | Admitting: *Deleted

## 2019-04-02 ENCOUNTER — Other Ambulatory Visit: Payer: Self-pay | Admitting: Family Medicine

## 2019-04-02 DIAGNOSIS — I10 Essential (primary) hypertension: Secondary | ICD-10-CM

## 2019-04-02 DIAGNOSIS — M109 Gout, unspecified: Secondary | ICD-10-CM

## 2019-04-02 DIAGNOSIS — R7303 Prediabetes: Secondary | ICD-10-CM

## 2019-04-02 DIAGNOSIS — Z79899 Other long term (current) drug therapy: Secondary | ICD-10-CM

## 2019-04-02 DIAGNOSIS — E785 Hyperlipidemia, unspecified: Secondary | ICD-10-CM

## 2019-04-02 MED ORDER — ALLOPURINOL 100 MG PO TABS: 100.0000 mg | ORAL_TABLET | Freq: Every day | ORAL | 0 refills | Status: DC

## 2019-04-02 NOTE — Telephone Encounter (Signed)
Lipid, metabolic 7, uric acid, 123456 Prediabetes hyperlipidemia hypertension gout

## 2019-04-02 NOTE — Telephone Encounter (Signed)
Patient may go ahead and have refills of his uric acid as well

## 2019-04-02 NOTE — Telephone Encounter (Signed)
Done and pt notified. 

## 2019-04-02 NOTE — Telephone Encounter (Signed)
Patient is requesting labs for 6 month follow up on 1/20 and also needing refill on allopurinol 100 mg called into [place]

## 2019-04-02 NOTE — Telephone Encounter (Signed)
Last labs august 2020 - lipid, liver, bmp, psa, a1c, microalbu urine

## 2019-04-02 NOTE — Telephone Encounter (Signed)
Pt notified that refill was sent. He wanted a 30 day supply to [facility] til mail order comes in and also notified his lab orders are put in.

## 2019-04-04 DIAGNOSIS — Z79899 Other long term (current) drug therapy: Secondary | ICD-10-CM | POA: Diagnosis not present

## 2019-04-04 DIAGNOSIS — M109 Gout, unspecified: Secondary | ICD-10-CM | POA: Diagnosis not present

## 2019-04-04 DIAGNOSIS — E785 Hyperlipidemia, unspecified: Secondary | ICD-10-CM | POA: Diagnosis not present

## 2019-04-04 DIAGNOSIS — I10 Essential (primary) hypertension: Secondary | ICD-10-CM | POA: Diagnosis not present

## 2019-04-04 DIAGNOSIS — R7303 Prediabetes: Secondary | ICD-10-CM | POA: Diagnosis not present

## 2019-04-05 LAB — BASIC METABOLIC PANEL
BUN/Creatinine Ratio: 12 (ref 10–24)
BUN: 14 mg/dL (ref 8–27)
CO2: 25 mmol/L (ref 20–29)
Calcium: 9.5 mg/dL (ref 8.6–10.2)
Chloride: 98 mmol/L (ref 96–106)
Creatinine, Ser: 1.13 mg/dL (ref 0.76–1.27)
GFR calc Af Amer: 76 mL/min/{1.73_m2} (ref >59)
GFR calc non Af Amer: 65 mL/min/{1.73_m2} (ref >59)
Glucose: 110 mg/dL — ABNORMAL HIGH (ref 65–99)
Potassium: 4.5 mmol/L (ref 3.5–5.2)
Sodium: 140 mmol/L (ref 134–144)

## 2019-04-05 LAB — HEPATIC FUNCTION PANEL
ALT: 17 IU/L (ref 0–44)
AST: 22 IU/L (ref 0–40)
Albumin: 4.7 g/dL (ref 3.8–4.8)
Alkaline Phosphatase: 103 IU/L (ref 39–117)
Bilirubin Total: 1.3 mg/dL — ABNORMAL HIGH (ref 0.0–1.2)
Bilirubin, Direct: 0.27 mg/dL (ref 0.00–0.40)
Total Protein: 7.3 g/dL (ref 6.0–8.5)

## 2019-04-05 LAB — LIPID PANEL
Chol/HDL Ratio: 3.6 ratio (ref 0.0–5.0)
Cholesterol, Total: 171 mg/dL (ref 100–199)
HDL: 48 mg/dL (ref >39)
LDL Chol Calc (NIH): 101 mg/dL — ABNORMAL HIGH (ref 0–99)
Triglycerides: 122 mg/dL (ref 0–149)
VLDL Cholesterol Cal: 22 mg/dL (ref 5–40)

## 2019-04-05 LAB — HEMOGLOBIN A1C
Est. average glucose Bld gHb Est-mCnc: 120 mg/dL
Hgb A1c MFr Bld: 5.8 % — ABNORMAL HIGH (ref 4.8–5.6)

## 2019-04-05 LAB — URIC ACID: Uric Acid: 5.7 mg/dL (ref 3.8–8.4)

## 2019-04-11 ENCOUNTER — Other Ambulatory Visit: Payer: Self-pay

## 2019-04-11 ENCOUNTER — Encounter: Payer: Self-pay | Admitting: Family Medicine

## 2019-04-11 ENCOUNTER — Other Ambulatory Visit: Payer: Self-pay | Admitting: Family Medicine

## 2019-04-11 ENCOUNTER — Ambulatory Visit (INDEPENDENT_AMBULATORY_CARE_PROVIDER_SITE_OTHER): Payer: Medicare HMO | Admitting: Family Medicine

## 2019-04-11 VITALS — BP 118/74 | Ht 68.0 in | Wt 210.0 lb

## 2019-04-11 DIAGNOSIS — I700 Atherosclerosis of aorta: Secondary | ICD-10-CM | POA: Diagnosis not present

## 2019-04-11 DIAGNOSIS — R7303 Prediabetes: Secondary | ICD-10-CM

## 2019-04-11 DIAGNOSIS — Z6832 Body mass index (BMI) 32.0-32.9, adult: Secondary | ICD-10-CM

## 2019-04-11 DIAGNOSIS — I10 Essential (primary) hypertension: Secondary | ICD-10-CM | POA: Diagnosis not present

## 2019-04-11 DIAGNOSIS — E785 Hyperlipidemia, unspecified: Secondary | ICD-10-CM | POA: Diagnosis not present

## 2019-04-11 MED ORDER — POTASSIUM CHLORIDE CRYS ER 10 MEQ PO TBCR: EXTENDED_RELEASE_TABLET | ORAL | 1 refills | Status: DC

## 2019-04-11 MED ORDER — AMLODIPINE BESYLATE 5 MG PO TABS: ORAL_TABLET | ORAL | 1 refills | Status: DC

## 2019-04-11 MED ORDER — ATORVASTATIN CALCIUM 40 MG PO TABS: ORAL_TABLET | ORAL | 1 refills | Status: DC

## 2019-04-11 MED ORDER — HYDROCHLOROTHIAZIDE 25 MG PO TABS: 25.0000 mg | ORAL_TABLET | Freq: Every day | ORAL | 1 refills | Status: DC

## 2019-04-11 NOTE — Progress Notes (Signed)
Subjective:    Patient ID: [name] [name], male    DOB: Jul 31, 1950, 69 y.o.   MRN: [id number]  Hypertension This is a chronic problem. Pertinent negatives include no chest pain, headaches or shortness of breath. There are no compliance problems (walks daily, takes meds every day, ).   He takes his blood pressure medicine regular basis states his overall been doing well denies any low blood pressure spells watches salt in the diet Takes his cholesterol regular basis but states his diet is not as good as it has been although he is try to work hard on this and try to stay safe in the pandemic He does take his diuretic and his potassium doing well with this Denies any gout flareups recently He is followed by his specialist for prostate cancer this is been under good control Pt stopped celexa after a couple of weeks due to it causing muscle cramps and stomach issues. He does not feel he needs anything in the place of it.  He was dealing with depression after his brothers death but now he states he is doing better Pt states no concerns today   Review of Systems  Constitutional: Negative for diaphoresis and fatigue.  HENT: Negative for congestion and rhinorrhea.   Respiratory: Negative for cough and shortness of breath.   Cardiovascular: Negative for chest pain and leg swelling.  Gastrointestinal: Negative for abdominal pain and diarrhea.  Skin: Negative for color change and rash.  Neurological: Negative for dizziness and headaches.  Psychiatric/Behavioral: Negative for behavioral problems and confusion.       Objective:   Physical Exam Vitals reviewed.  Constitutional:      General: He is not in acute distress. HENT:     Head: Normocephalic and atraumatic.  Eyes:     General:        Right eye: No discharge.        Left eye: No discharge.  Neck:     Trachea: No tracheal deviation.  Cardiovascular:     Rate and Rhythm: Normal rate and regular rhythm.     Heart sounds: Normal  heart sounds. No murmur.  Pulmonary:     Effort: Pulmonary effort is normal. No respiratory distress.     Breath sounds: Normal breath sounds.  Lymphadenopathy:     Cervical: No cervical adenopathy.  Skin:    General: Skin is warm and dry.  Neurological:     Mental Status: He is alert.     Coordination: Coordination normal.  Psychiatric:        Behavior: Behavior normal.    Results for orders placed or performed in visit on 04/02/19  Lipid Profile  Result Value Ref Range   Cholesterol, Total 171 100 - 199 mg/dL   Triglycerides 122 0 - 149 mg/dL   HDL 48 >39 mg/dL   VLDL Cholesterol Cal 22 5 - 40 mg/dL   LDL Chol Calc (NIH) 101 (H) 0 - 99 mg/dL   Chol/HDL Ratio 3.6 0.0 - 5.0 ratio  Hepatic function panel  Result Value Ref Range   Total Protein 7.3 6.0 - 8.5 g/dL   Albumin 4.7 3.8 - 4.8 g/dL   Bilirubin Total 1.3 (H) 0.0 - 1.2 mg/dL   Bilirubin, Direct 0.27 0.00 - 0.40 mg/dL   Alkaline Phosphatase 103 39 - 117 IU/L   AST 22 0 - 40 IU/L   ALT 17 0 - 44 IU/L  Basic Metabolic Panel (BMET)  Result Value Ref Range   Glucose  110 (H) 65 - 99 mg/dL   BUN 14 8 - 27 mg/dL   Creatinine, Ser 1.13 0.76 - 1.27 mg/dL   GFR calc non Af Amer 65 >59 mL/min/1.73   GFR calc Af Amer 76 >59 mL/min/1.73   BUN/Creatinine Ratio 12 10 - 24   Sodium 140 134 - 144 mmol/L   Potassium 4.5 3.5 - 5.2 mmol/L   Chloride 98 96 - 106 mmol/L   CO2 25 20 - 29 mmol/L   Calcium 9.5 8.6 - 10.2 mg/dL  Uric acid  Result Value Ref Range   Uric Acid 5.7 3.8 - 8.4 mg/dL  Hemoglobin A1c  Result Value Ref Range   Hgb A1c MFr Bld 5.8 (H) 4.8 - 5.6 %   Est. average glucose Bld gHb Est-mCnc 120 mg/dL    Mild obesity patient is working hard to try to watch diet stay more active try to get his weight down  We did discuss Covid vaccine also discussed protection against Covid     Assessment & Plan:  Gout under good control uric acid good continue medication  Recent depression actually doing fine now no need  for further medication  Hyperlipidemia actually is somewhat worse patient would like to stay where he is at with the medicine but doing better on his diet recheck it again my summer  A1c is gone up some he will try exercising more continue current approach he does not want to be on medication currently  Blood pressure good control continue current measures  Has aortic atherosclerosis will work hard at keeping cholesterol under good control  Further lab work in 6 months with follow-up office visit

## 2019-05-09 ENCOUNTER — Ambulatory Visit: Payer: Medicare HMO | Admitting: Orthopedic Surgery

## 2019-05-09 ENCOUNTER — Ambulatory Visit: Payer: Medicare HMO

## 2019-05-09 ENCOUNTER — Other Ambulatory Visit: Payer: Self-pay

## 2019-05-09 ENCOUNTER — Encounter: Payer: Self-pay | Admitting: Orthopedic Surgery

## 2019-05-09 VITALS — BP 156/77 | HR 93 | Ht 67.0 in | Wt 215.0 lb

## 2019-05-09 DIAGNOSIS — Z8546 Personal history of malignant neoplasm of prostate: Secondary | ICD-10-CM | POA: Diagnosis not present

## 2019-05-09 DIAGNOSIS — Z96641 Presence of right artificial hip joint: Secondary | ICD-10-CM | POA: Diagnosis not present

## 2019-05-09 DIAGNOSIS — M1711 Unilateral primary osteoarthritis, right knee: Secondary | ICD-10-CM | POA: Diagnosis not present

## 2019-05-09 DIAGNOSIS — Z96651 Presence of right artificial knee joint: Secondary | ICD-10-CM | POA: Diagnosis not present

## 2019-05-09 DIAGNOSIS — M1611 Unilateral primary osteoarthritis, right hip: Secondary | ICD-10-CM

## 2019-05-09 DIAGNOSIS — E291 Testicular hypofunction: Secondary | ICD-10-CM | POA: Diagnosis not present

## 2019-05-09 NOTE — Progress Notes (Signed)
Chief Complaint  Patient presents with  . Post-op Follow-up    right knee replacement 05/16/18 right hip replacement 04/20/13    [name] [name] has no complaints his right knee is doing well and his right hip is doing well  He says he can even tell he had right hip surgery.  As far as the knee goes he says on cold rainy days it does ache but as far as his function goes he is doing great  His exam shows 125 degrees of knee flexion full extension stable knee  He has excellent hip flexion no pain  X-rays are showing stable implants  Follow-up in a year for x-rays of both  Encounter Diagnoses  Name Primary?  . S/P hip replacement, right 04/20/13 Yes  . S/P TKR (total knee replacement), right//May 16, 2018   . Primary osteoarthritis of right knee   . Unilateral primary osteoarthritis, right hip

## 2019-05-10 LAB — HEMATOCRIT: HCT: 42.6 % (ref 38.5–50.0)

## 2019-05-10 LAB — HEMOGLOBIN: Hemoglobin: 14.4 g/dL (ref 13.2–17.1)

## 2019-05-10 LAB — PSA: PSA: <0.1 ng/mL (ref <=4.0)

## 2019-05-10 LAB — TESTOSTERONE: Testosterone: 214 ng/dL — ABNORMAL LOW (ref 250–827)

## 2019-05-17 NOTE — Progress Notes (Signed)
Subjective:  1. History of prostate cancer   2. Hypogonadism in male   3. Erectile dysfunction due to arterial insufficiency      I have prostate cancer. HPI: [name] [name] is a 69 year-old male established patient who is here evaluation for treatment of prostate cancer.    [name] returns today in f/u for his history of hypogonadism and prostate cancer. He is s/p a RALP on 04/20/11. His PSA is <0.1. He had Gleason 7(3+4) T2c N0 disease. He is doing well and has no SUI but can have UUI and has noted that once he gets the urge he can only hold it q15-20 min. He has no hematuria or dysuria. He has a good stream.   He has had some erectile dysfunction that he manages with sildenafil 20mg .   He remains on compounded testosterone 4% 3 clicks daily. His T level is 214 and his Hgb is 14.4.   He is doing well with improved energy.   . IPSS    Row Name 05/18/19 1000         International Prostate Symptom Score   How often have you had the sensation of not emptying your bladder?  About half the time     How often have you had to urinate less than every two hours?  Less than half the time     How often have you found you stopped and started again several times when you urinated?  Not at All     How often have you found it difficult to postpone urination?  About half the time     How often have you had a weak urinary stream?  Not at All     How often have you had to strain to start urination?  Not at All     How many times did you typically get up at night to urinate?  3 Times     Total IPSS Score  11       Quality of Life due to urinary symptoms   If you were to spend the rest of your life with your urinary condition just the way it is now how would you feel about that?  Mostly Satisfied            ROS:  ROS:  A complete review of systems was performed.  All systems are negative except for pertinent findings as noted.   Review of Systems  Constitutional: Negative for weight  loss.  Genitourinary: Positive for urgency.  Musculoskeletal: Positive for joint pain.    Allergies  Allergen Reactions  . Lipitor [Atorvastatin] Other (See Comments)    Myalgia   . Lisinopril Cough    Dry cough    Outpatient Encounter Medications as of 05/18/2019  Medication Sig  . allopurinol (ZYLOPRIM) 100 MG tablet Take 1 tablet (100 mg total) by mouth daily.  [name] amLODipine (NORVASC) 5 MG tablet TAKE 1 TABLET (5 MG TOTAL) BY MOUTH DAILY.  [name] aspirin 81 MG chewable tablet Chew 81 mg by mouth daily.   [name] atorvastatin (LIPITOR) 40 MG tablet TAKE 1 TABLET (40 MG TOTAL) BY MOUTH DAILY.  . B Complex-C (SUPER B COMPLEX PO) Take 1 tablet by mouth daily.  . hydrochlorothiazide (HYDRODIURIL) 25 MG tablet Take 1 tablet (25 mg total) by mouth daily.  . Multiple Vitamin (MULTIVITAMIN) tablet Take 1 tablet by mouth daily.   . potassium chloride (KLOR-CON) 10 MEQ tablet TAKE 1 TABLET (10 MEQ TOTAL) BY MOUTH DAILY.  [name]  sildenafil (REVATIO) 20 MG tablet 1-5 po prn  . Testosterone 20 % CREA Apply 3 mLs topically in the morning.   No facility-administered encounter medications on file as of 05/18/2019.    Past Medical History:  Diagnosis Date  . Aortic atherosclerosis ([place]) 10/19/2016    Seen on chest x-ray July 2018  . Arthritis    right knee and hip   . Cancer [facility])    prostate cancer   . Chronic kidney disease    prostate cancer   . Diabetes mellitus without complication ([place])   . GERD (gastroesophageal reflux disease)   . Gout   . Gout   . Hypercholesteremia   . Hyperglycemia   . Hyperlipidemia   . Hypertension   . Sleep apnea   . Status post THR (total hip replacement) 04/20/13 05/01/2013   Right total hip 04/20/2013 Dr. Lydia Guiles implant     Past Surgical History:  Procedure Laterality Date  . COLONOSCOPY    . COLONOSCOPY N/A 10/22/2013   Procedure: COLONOSCOPY;  Surgeon: Danie Binder, MD;  Location: [facility];  Service: Endoscopy;  Laterality: N/A;  1115  . HERNIA  REPAIR     right inguinal hernia repair   . NASAL SEPTUM SURGERY    . OTHER SURGICAL HISTORY     deviated septum surgery   . ROBOT ASSISTED LAPAROSCOPIC RADICAL PROSTATECTOMY  04/20/2011   Procedure: ROBOTIC ASSISTED LAPAROSCOPIC RADICAL PROSTATECTOMY;  Surgeon: Malka So, MD;  Location: [facility] ORS;  Service: Urology;  Laterality: N/A;  Bilateral lymph node dissection  . TOTAL HIP ARTHROPLASTY Right 04/20/2013   Procedure: TOTAL HIP ARTHROPLASTY;  Surgeon: Carole Civil, MD;  Location: [facility] ORS;  Service: Orthopedics;  Laterality: Right;  . TOTAL KNEE ARTHROPLASTY Right 05/16/2018   Procedure: TOTAL KNEE ARTHROPLASTY;  Surgeon: Carole Civil, MD;  Location: [facility] ORS;  Service: Orthopedics;  Laterality: Right;    Social History   Socioeconomic History  . Marital status: Single    Spouse name: Not on file  . Number of children: Not on file  . Years of education: Not on file  . Highest education level: Not on file  Occupational History  . Not on file  Tobacco Use  . Smoking status: Former Smoker    Packs/day: 0.50    Years: 8.00    Pack years: 4.00    Types: Cigarettes    Quit date: 03/22/1984    Years since quitting: 35.1  . Smokeless tobacco: Never Used  Substance and Sexual Activity  . Alcohol use: Yes    Comment: occasional   . Drug use: No  . Sexual activity: Yes    Birth control/protection: None  Other Topics Concern  . Not on file  Social History Narrative  . Not on file   Social Determinants of Health   Financial Resource Strain:   . Difficulty of Paying Living Expenses: Not on file  Food Insecurity:   . Worried About Charity fundraiser in the Last Year: Not on file  . Ran Out of Food in the Last Year: Not on file  Transportation Needs:   . Lack of Transportation (Medical): Not on file  . Lack of Transportation (Non-Medical): Not on file  Physical Activity:   . Days of Exercise per Week: Not on file  . Minutes of Exercise per Session: Not on file   Stress:   . Feeling of Stress : Not on file  Social Connections:   . Frequency of  Communication with Friends and Family: Not on file  . Frequency of Social Gatherings with Friends and Family: Not on file  . Attends Religious Services: Not on file  . Active Member of Clubs or Organizations: Not on file  . Attends Archivist Meetings: Not on file  . Marital Status: Not on file  Intimate Partner Violence:   . Fear of Current or Ex-Partner: Not on file  . Emotionally Abused: Not on file  . Physically Abused: Not on file  . Sexually Abused: Not on file    Family History  Problem Relation Age of Onset  . Diabetes Mother   . Hypertension Mother   . Hypertension Brother        Objective: BP 126/72   Pulse 99   Temp (!) 97.2 F (36.2 C)   Ht 5\' 7"  (1.702 m)   Wt 205 lb (93 kg)   BMI 32.11 kg/m     Physical Exam  Lab Results:  Results for orders placed or performed in visit on 05/18/19 (from the past 24 hour(s))  POCT urinalysis dipstick     Status: Abnormal   Collection Time: 05/18/19  9:43 AM  Result Value Ref Range   Color, UA yellow    Clarity, UA clear    Glucose, UA Negative Negative   Bilirubin, UA small    Ketones, UA neg    Spec Grav, UA >=1.030 (A) 1.010 - 1.025   Blood, UA neg    pH, UA 5.0 5.0 - 8.0   Protein, UA Positive (A) Negative   Urobilinogen, UA negative (A) 0.2 or 1.0 E.U./dL   Nitrite, UA neg    Leukocytes, UA Negative Negative   Appearance clear    Odor      BMET No results for input(s): NA, K, CL, CO2, GLUCOSE, BUN, CREATININE, CALCIUM in the last 72 hours. PSA PSA  Date Value Ref Range Status  05/09/2019 <0.1 < OR = 4.0 ng/mL Final    Comment:    The total PSA value from this assay system is  standardized against the WHO standard. The test  result will be approximately 20% lower when compared  to the equimolar-standardized total PSA (Beckman  Coulter). Comparison of serial PSA results should be  interpreted with this  fact in mind. . This test was performed using the Siemens  chemiluminescent method. Values obtained from  different assay methods cannot be used interchangeably. PSA levels, regardless of value, should not be interpreted as absolute evidence of the presence or absence of disease.    Testosterone  Date Value Ref Range Status  05/09/2019 214 (L) 250 - 827 ng/dL Final    Comment:    In hypogonadal males, Testosterone, Total, LC/MS/MS, is the recommended assay due to the diminished accuracy of immunoassay at levels below 250 ng/dL. This test code [id number]) must be collected in a red-top tube with no gel.     Hgb is 14.4.   Studies/Results: No results found.    Assessment & Plan: Hypogonadism.  He is doing well on therapy.  Med refilled.  ED.  Sildenafil refills.  Hx of prostate cancer.  PSA remains undetectible.     Meds ordered this encounter  Medications  . sildenafil (REVATIO) 20 MG tablet    Sig: 1-5 po prn    Dispense:  20 tablet    Refill:  11  . Testosterone 20 % CREA    Sig: Apply 3 mLs topically in the morning.    Dispense:  100 g    Refill:  4     Orders Placed This Encounter  Procedures  . Testosterone    Standing Status:   Future    Standing Expiration Date:   05/17/2020  . Hemoglobin and hematocrit, blood    Standing Status:   Future    Standing Expiration Date:   05/17/2020  . PSA    Standing Status:   Future    Standing Expiration Date:   05/17/2020  . POCT urinalysis dipstick      Return in about 6 months (around 11/15/2019) for f/u of low testosterone. .   CC: Kathyrn Drown, MD      Irine Seal 05/18/2019

## 2019-05-18 ENCOUNTER — Other Ambulatory Visit: Payer: Self-pay

## 2019-05-18 ENCOUNTER — Ambulatory Visit (INDEPENDENT_AMBULATORY_CARE_PROVIDER_SITE_OTHER): Payer: Medicare HMO | Admitting: Urology

## 2019-05-18 ENCOUNTER — Encounter: Payer: Self-pay | Admitting: Urology

## 2019-05-18 VITALS — BP 126/72 | HR 99 | Temp 97.2°F | Ht 67.0 in | Wt 205.0 lb

## 2019-05-18 DIAGNOSIS — Z8546 Personal history of malignant neoplasm of prostate: Secondary | ICD-10-CM | POA: Diagnosis not present

## 2019-05-18 DIAGNOSIS — E291 Testicular hypofunction: Secondary | ICD-10-CM | POA: Diagnosis not present

## 2019-05-18 DIAGNOSIS — N5201 Erectile dysfunction due to arterial insufficiency: Secondary | ICD-10-CM

## 2019-05-18 LAB — POCT URINALYSIS DIPSTICK
Blood, UA: NEGATIVE
Glucose, UA: NEGATIVE
Ketones, UA: NEGATIVE
Leukocytes, UA: NEGATIVE
Nitrite, UA: NEGATIVE
Protein, UA: POSITIVE — AB
Spec Grav, UA: >=1.03 — AB (ref 1.010–1.025)
Urobilinogen, UA: NEGATIVE E.U./dL — AB
pH, UA: 5 (ref 5.0–8.0)

## 2019-05-18 MED ORDER — TESTOSTERONE 20 % CREA: 3.0000 mL | TOPICAL_CREAM | Freq: Every morning | 4 refills | Status: DC

## 2019-05-18 MED ORDER — SILDENAFIL CITRATE 20 MG PO TABS: ORAL_TABLET | ORAL | 11 refills | Status: DC

## 2019-08-27 ENCOUNTER — Other Ambulatory Visit: Payer: Self-pay | Admitting: Family Medicine

## 2019-09-10 ENCOUNTER — Telehealth: Payer: Self-pay | Admitting: Family Medicine

## 2019-09-10 DIAGNOSIS — I10 Essential (primary) hypertension: Secondary | ICD-10-CM

## 2019-09-10 DIAGNOSIS — M1A9XX0 Chronic gout, unspecified, without tophus (tophi): Secondary | ICD-10-CM

## 2019-09-10 DIAGNOSIS — Z79899 Other long term (current) drug therapy: Secondary | ICD-10-CM

## 2019-09-10 DIAGNOSIS — E785 Hyperlipidemia, unspecified: Secondary | ICD-10-CM

## 2019-09-10 NOTE — Telephone Encounter (Signed)
Patient has appointment for physical in August and needing labs done.

## 2019-09-10 NOTE — Telephone Encounter (Signed)
Last labs done 04/04/19 lipid, liver, bmp, uric acid, a1c

## 2019-09-11 NOTE — Telephone Encounter (Signed)
Orders put in and pt was notified. He did not want to do a1c unless dr [name] recommended

## 2019-09-11 NOTE — Telephone Encounter (Signed)
Lipid, liver, uric acid, metabolic 7-HTN hyperlipidemia, history gout  Also please talk with patient-his urologist follows his PSA Let the patient know we did check a A1c earlier in the year and looked overall very good therefore I do not feel he needs to repeat this currently (unless he truly wants to repeat it for prediabetes but I would recommend doing this on a yearly basis)

## 2019-09-13 ENCOUNTER — Other Ambulatory Visit: Payer: Self-pay | Admitting: Family Medicine

## 2019-10-03 ENCOUNTER — Other Ambulatory Visit: Payer: Self-pay | Admitting: Family Medicine

## 2019-10-25 DIAGNOSIS — M1A9XX0 Chronic gout, unspecified, without tophus (tophi): Secondary | ICD-10-CM | POA: Diagnosis not present

## 2019-10-25 DIAGNOSIS — E785 Hyperlipidemia, unspecified: Secondary | ICD-10-CM | POA: Diagnosis not present

## 2019-10-25 DIAGNOSIS — I10 Essential (primary) hypertension: Secondary | ICD-10-CM | POA: Diagnosis not present

## 2019-10-25 DIAGNOSIS — Z79899 Other long term (current) drug therapy: Secondary | ICD-10-CM | POA: Diagnosis not present

## 2019-10-26 LAB — BASIC METABOLIC PANEL
BUN/Creatinine Ratio: 15 (ref 10–24)
BUN: 15 mg/dL (ref 8–27)
CO2: 26 mmol/L (ref 20–29)
Calcium: 8.9 mg/dL (ref 8.6–10.2)
Chloride: 97 mmol/L (ref 96–106)
Creatinine, Ser: 1 mg/dL (ref 0.76–1.27)
GFR calc Af Amer: 87 mL/min/{1.73_m2} (ref >59)
GFR calc non Af Amer: 75 mL/min/{1.73_m2} (ref >59)
Glucose: 104 mg/dL — ABNORMAL HIGH (ref 65–99)
Potassium: 4.2 mmol/L (ref 3.5–5.2)
Sodium: 137 mmol/L (ref 134–144)

## 2019-10-26 LAB — LIPID PANEL
Chol/HDL Ratio: 3.6 ratio (ref 0.0–5.0)
Cholesterol, Total: 164 mg/dL (ref 100–199)
HDL: 46 mg/dL (ref >39)
LDL Chol Calc (NIH): 99 mg/dL (ref 0–99)
Triglycerides: 102 mg/dL (ref 0–149)
VLDL Cholesterol Cal: 19 mg/dL (ref 5–40)

## 2019-10-26 LAB — HEPATIC FUNCTION PANEL
ALT: 16 IU/L (ref 0–44)
AST: 18 IU/L (ref 0–40)
Albumin: 4.4 g/dL (ref 3.7–4.7)
Alkaline Phosphatase: 103 IU/L (ref 48–121)
Bilirubin Total: 1.4 mg/dL — ABNORMAL HIGH (ref 0.0–1.2)
Bilirubin, Direct: 0.32 mg/dL (ref 0.00–0.40)
Total Protein: 6.9 g/dL (ref 6.0–8.5)

## 2019-10-26 LAB — URIC ACID: Uric Acid: 5.5 mg/dL (ref 3.8–8.4)

## 2019-11-01 ENCOUNTER — Other Ambulatory Visit: Payer: Self-pay

## 2019-11-01 ENCOUNTER — Ambulatory Visit (INDEPENDENT_AMBULATORY_CARE_PROVIDER_SITE_OTHER): Payer: Medicare HMO | Admitting: Family Medicine

## 2019-11-01 VITALS — BP 132/86 | Temp 97.6°F | Ht 67.0 in | Wt 214.2 lb

## 2019-11-01 DIAGNOSIS — Z0000 Encounter for general adult medical examination without abnormal findings: Secondary | ICD-10-CM | POA: Diagnosis not present

## 2019-11-01 DIAGNOSIS — F329 Major depressive disorder, single episode, unspecified: Secondary | ICD-10-CM | POA: Diagnosis not present

## 2019-11-01 DIAGNOSIS — F419 Anxiety disorder, unspecified: Secondary | ICD-10-CM

## 2019-11-01 DIAGNOSIS — I10 Essential (primary) hypertension: Secondary | ICD-10-CM

## 2019-11-01 DIAGNOSIS — F32A Depression, unspecified: Secondary | ICD-10-CM

## 2019-11-01 DIAGNOSIS — Z87891 Personal history of nicotine dependence: Secondary | ICD-10-CM | POA: Diagnosis not present

## 2019-11-01 DIAGNOSIS — Z136 Encounter for screening for cardiovascular disorders: Secondary | ICD-10-CM | POA: Diagnosis not present

## 2019-11-01 DIAGNOSIS — E785 Hyperlipidemia, unspecified: Secondary | ICD-10-CM

## 2019-11-01 DIAGNOSIS — I700 Atherosclerosis of aorta: Secondary | ICD-10-CM

## 2019-11-01 MED ORDER — AMLODIPINE BESYLATE 5 MG PO TABS: ORAL_TABLET | ORAL | 1 refills | Status: DC

## 2019-11-01 MED ORDER — ATORVASTATIN CALCIUM 40 MG PO TABS: ORAL_TABLET | ORAL | 1 refills | Status: DC

## 2019-11-01 MED ORDER — POTASSIUM CHLORIDE CRYS ER 10 MEQ PO TBCR: EXTENDED_RELEASE_TABLET | ORAL | 1 refills | Status: DC

## 2019-11-01 MED ORDER — ALLOPURINOL 100 MG PO TABS: 100.0000 mg | ORAL_TABLET | Freq: Every day | ORAL | 1 refills | Status: DC

## 2019-11-01 MED ORDER — BUPROPION HCL ER (XL) 150 MG PO TB24: 150.0000 mg | ORAL_TABLET | Freq: Every day | ORAL | 1 refills | Status: DC

## 2019-11-01 MED ORDER — HYDROCHLOROTHIAZIDE 25 MG PO TABS: 25.0000 mg | ORAL_TABLET | Freq: Every day | ORAL | 1 refills | Status: DC

## 2019-11-01 NOTE — Patient Instructions (Signed)
Results for orders placed or performed in visit on 09/10/19  Lipid panel  Result Value Ref Range   Cholesterol, Total 164 100 - 199 mg/dL   Triglycerides 102 0 - 149 mg/dL   HDL 46 >39 mg/dL   VLDL Cholesterol Cal 19 5 - 40 mg/dL   LDL Chol Calc (NIH) 99 0 - 99 mg/dL   Chol/HDL Ratio 3.6 0.0 - 5.0 ratio  Hepatic function panel  Result Value Ref Range   Total Protein 6.9 6.0 - 8.5 g/dL   Albumin 4.4 3.7 - 4.7 g/dL   Bilirubin Total 1.4 (H) 0.0 - 1.2 mg/dL   Bilirubin, Direct 0.32 0.00 - 0.40 mg/dL   Alkaline Phosphatase 103 48 - 121 IU/L   AST 18 0 - 40 IU/L   ALT 16 0 - 44 IU/L  Uric acid  Result Value Ref Range   Uric Acid 5.5 3.8 - 8.4 mg/dL  Basic metabolic panel  Result Value Ref Range   Glucose 104 (H) 65 - 99 mg/dL   BUN 15 8 - 27 mg/dL   Creatinine, Ser 1.00 0.76 - 1.27 mg/dL   GFR calc non Af Amer 75 >59 mL/min/1.73   GFR calc Af Amer 87 >59 mL/min/1.73   BUN/Creatinine Ratio 15 10 - 24   Sodium 137 134 - 144 mmol/L   Potassium 4.2 3.5 - 5.2 mmol/L   Chloride 97 96 - 106 mmol/L   CO2 26 20 - 29 mmol/L   Calcium 8.9 8.6 - 10.2 mg/dL

## 2019-11-01 NOTE — Progress Notes (Signed)
Subjective:    Patient ID: [name] [name], male    DOB: Aug 21, 1950, 69 y.o.   MRN: [id number]  HPI  The patient comes in today for a wellness visit.    A review of their health history was completed.  A review of medications was also completed.  Any needed refills; yes  Eating habits: mostly good  Falls/  MVA accidents in past few months: none  Regular exercise: keeps busy and moving at work and home  Specialist pt sees on regular basis: Dr Jeffie Pollock- Urology  Preventative health issues were discussed.   Additional concerns: none   Review of Systems  Constitutional: Negative for activity change, appetite change and fever.  HENT: Negative for congestion and rhinorrhea.   Eyes: Negative for discharge.  Respiratory: Negative for cough and wheezing.   Cardiovascular: Negative for chest pain.  Gastrointestinal: Negative for abdominal pain, blood in stool and vomiting.  Genitourinary: Negative for difficulty urinating and frequency.  Musculoskeletal: Negative for neck pain.  Skin: Negative for rash.  Allergic/Immunologic: Negative for environmental allergies and food allergies.  Neurological: Negative for weakness and headaches.  Psychiatric/Behavioral: Negative for agitation.       Objective:   Physical Exam Constitutional:      Appearance: He is well-developed.  HENT:     Head: Normocephalic and atraumatic.     Right Ear: External ear normal.     Left Ear: External ear normal.     Nose: Nose normal.  Eyes:     Pupils: Pupils are equal, round, and reactive to light.  Neck:     Thyroid: No thyromegaly.  Cardiovascular:     Rate and Rhythm: Normal rate and regular rhythm.     Heart sounds: Normal heart sounds. No murmur heard.   Pulmonary:     Effort: Pulmonary effort is normal. No respiratory distress.     Breath sounds: Normal breath sounds. No wheezing.  Abdominal:     General: Bowel sounds are normal. There is no distension.     Palpations: Abdomen is  soft. There is no mass.     Tenderness: There is no abdominal tenderness.  Genitourinary:    Penis: Normal.   Musculoskeletal:        General: Normal range of motion.     Cervical back: Normal range of motion and neck supple.  Lymphadenopathy:     Cervical: No cervical adenopathy.  Skin:    General: Skin is warm and dry.     Findings: No erythema.  Neurological:     Mental Status: He is alert.     Motor: No abnormal muscle tone.  Psychiatric:        Behavior: Behavior normal.        Judgment: Judgment normal.           Assessment & Plan:  1. History of smoking Patient smoked when he was in his 13s he has never had ultrasound of the aorta done very important to do this - US AORTA  2. Screening for heart disease Patient does have aortic atherosclerosis.  Keeping cholesterol under control best way to minimize risk of heart disease  3. Aortic atherosclerosis (HCC) Keep cholesterol under good control  4. Benign essential hypertension Blood pressure overall looks good continue current measures  5. Hyperlipidemia, unspecified hyperlipidemia type Cholesterol good control continue current measures  6. Encounter for subsequent annual wellness visit (AWV) in Medicare patient Adult wellness-complete.wellness physical was conducted today. Importance of diet and exercise were discussed  in detail.  In addition to this a discussion regarding safety was also covered. We also reviewed over immunizations and gave recommendations regarding current immunization needed for age.  In addition to this additional areas were also touched on including: Preventative health exams needed:  Colonoscopy up-to-date on colonoscopy next 04/11/2023  Patient was advised yearly wellness exam   7. Anxiety and depression Patient did not tolerate citalopram we will try Wellbutrin 150 mg XL he will send [place] update in a few weeks time how this is doing we will do a virtual visit with him in 4 weeks

## 2019-11-07 NOTE — Addendum Note (Signed)
Addended by: Dorisann Frames on: 11/07/2019 10:39 AM   Modules accepted: Orders

## 2019-11-08 ENCOUNTER — Other Ambulatory Visit: Payer: Medicare HMO

## 2019-11-08 ENCOUNTER — Other Ambulatory Visit: Payer: Self-pay

## 2019-11-08 DIAGNOSIS — Z8546 Personal history of malignant neoplasm of prostate: Secondary | ICD-10-CM | POA: Diagnosis not present

## 2019-11-08 DIAGNOSIS — E291 Testicular hypofunction: Secondary | ICD-10-CM

## 2019-11-12 ENCOUNTER — Ambulatory Visit (HOSPITAL_COMMUNITY): Payer: Medicare HMO

## 2019-11-12 LAB — TESTOSTERONE,FREE AND TOTAL
Testosterone, Free: 2.6 pg/mL — ABNORMAL LOW (ref 6.6–18.1)
Testosterone: 214 ng/dL — ABNORMAL LOW (ref 264–916)

## 2019-11-12 LAB — PSA: Prostate Specific Ag, Serum: <0.1 ng/mL (ref 0.0–4.0)

## 2019-11-12 LAB — HEMOGLOBIN: Hemoglobin: 14.7 g/dL (ref 13.0–17.7)

## 2019-11-12 LAB — HEMATOCRIT: Hematocrit: 42.4 % (ref 37.5–51.0)

## 2019-11-13 ENCOUNTER — Other Ambulatory Visit: Payer: Self-pay

## 2019-11-13 ENCOUNTER — Ambulatory Visit (HOSPITAL_COMMUNITY)
Admission: RE | Admit: 2019-11-13 | Discharge: 2019-11-13 | Disposition: A | Discharge status: Home / Self Care | Payer: Medicare HMO | Source: Ambulatory Visit | Attending: Family Medicine | Admitting: Family Medicine

## 2019-11-13 DIAGNOSIS — E669 Obesity, unspecified: Secondary | ICD-10-CM | POA: Insufficient documentation

## 2019-11-13 DIAGNOSIS — Z136 Encounter for screening for cardiovascular disorders: Secondary | ICD-10-CM | POA: Insufficient documentation

## 2019-11-13 DIAGNOSIS — I10 Essential (primary) hypertension: Secondary | ICD-10-CM | POA: Diagnosis not present

## 2019-11-13 DIAGNOSIS — E119 Type 2 diabetes mellitus without complications: Secondary | ICD-10-CM | POA: Diagnosis not present

## 2019-11-13 DIAGNOSIS — Z87891 Personal history of nicotine dependence: Secondary | ICD-10-CM | POA: Diagnosis not present

## 2019-11-16 ENCOUNTER — Ambulatory Visit: Payer: Medicare HMO | Admitting: Urology

## 2019-12-03 ENCOUNTER — Other Ambulatory Visit: Payer: Self-pay

## 2019-12-03 ENCOUNTER — Telehealth: Payer: Self-pay | Admitting: Family Medicine

## 2019-12-03 ENCOUNTER — Telehealth (INDEPENDENT_AMBULATORY_CARE_PROVIDER_SITE_OTHER): Payer: Medicare HMO | Admitting: Family Medicine

## 2019-12-03 DIAGNOSIS — F419 Anxiety disorder, unspecified: Secondary | ICD-10-CM

## 2019-12-03 DIAGNOSIS — F329 Major depressive disorder, single episode, unspecified: Secondary | ICD-10-CM | POA: Diagnosis not present

## 2019-12-03 MED ORDER — BUPROPION HCL ER (XL) 150 MG PO TB24: 150.0000 mg | ORAL_TABLET | Freq: Every day | ORAL | 1 refills | Status: DC

## 2019-12-03 NOTE — Progress Notes (Signed)
   Subjective:    Patient ID: [name] [name], male    DOB: [birth year], 68 y.o.   MRN: [id number]  HPI Pt needing follow up on medication. Pt is taking Wellbutrin 150 mg daily. Doing really good and has lost 7 pounds in one month. Pt is taking Aleve as needed for arthritis. Pt taking all other meds as prescribed. No side effects.  Pt would like to get Wellbutrin at [facility]. Depression follow-up Doing much better on medicine  Virtual Visit via Telephone Note  I connected with [name] [name] on 12/03/19 at 11:00 AM EDT by telephone and verified that I am speaking with the correct person using two identifiers.  Location: Patient: home Provider: office   I discussed the limitations, risks, security and privacy concerns of performing an evaluation and management service by telephone and the availability of in person appointments. I also discussed with the patient that there may be a patient responsible charge related to this service. The patient expressed understanding and agreed to proceed.   History of Present Illness:    Observations/Objective:   Assessment and Plan:   Follow Up Instructions:    I discussed the assessment and treatment plan with the patient. The patient was provided an opportunity to ask questions and all were answered. The patient agreed with the plan and demonstrated an understanding of the instructions.   The patient was advised to call back or seek an in-person evaluation if the symptoms worsen or if the condition fails to improve as anticipated.  I provided 15 minutes of non-face-to-face time during this encounter.  Should the patient have any setbacks or problems with depression he will let us know Fall Risk  11/01/2019 10/30/2018 10/06/2015 04/07/2015  Falls in the past year? 0 0 No No  Follow up Falls evaluation completed - - -       Review of Systems Please see above    Objective:   Physical Exam Today's visit was via  telephone Physical exam was not possible for this visit        Assessment & Plan:  Depression much improved continue current medications follow-up in the spring follow-up sooner problems

## 2019-12-03 NOTE — Addendum Note (Signed)
Addended by: Vicente Males on: 12/03/2019 02:17 PM   Modules accepted: Orders

## 2019-12-03 NOTE — Telephone Encounter (Signed)
Mr. josearmando, kuhnert are scheduled for a virtual visit with your provider today.    Just as we do with appointments in the office, we must obtain your consent to participate.  Your consent will be active for this visit and any virtual visit you may have with one of our providers in the next 365 days.    If you have a MyChart account, I can also send a copy of this consent to you electronically.  All virtual visits are billed to your insurance company just like a traditional visit in the office.  As this is a virtual visit, video technology does not allow for your provider to perform a traditional examination.  This may limit your provider's ability to fully assess your condition.  If your provider identifies any concerns that need to be evaluated in person or the need to arrange testing such as labs, EKG, etc, we will make arrangements to do so.    Although advances in technology are sophisticated, we cannot ensure that it will always work on either your end or our end.  If the connection with a video visit is poor, we may have to switch to a telephone visit.  With either a video or telephone visit, we are not always able to ensure that we have a secure connection.   I need to obtain your verbal consent now.   Are you willing to proceed with your visit today?   EZELL POKE has provided verbal consent on 12/03/2019 for a virtual visit (video or telephone).   Vicente Males, LPN 03/24/1592  58:59 AM

## 2019-12-20 ENCOUNTER — Other Ambulatory Visit: Payer: Self-pay | Admitting: *Deleted

## 2019-12-20 MED ORDER — BUPROPION HCL ER (XL) 150 MG PO TB24: 150.0000 mg | ORAL_TABLET | Freq: Every day | ORAL | 1 refills | Status: DC

## 2019-12-25 ENCOUNTER — Other Ambulatory Visit: Payer: Self-pay | Admitting: Urology

## 2019-12-25 ENCOUNTER — Telehealth: Payer: Self-pay

## 2019-12-25 NOTE — Telephone Encounter (Signed)
He is on compounded testosterone 4% cream and uses 3 clicks qam.  He needs a one month supply with refills.   The compounded testosterone products can't be sent electronically.   Please call [facility] and ok the refill.   If they won't take a verbal on that, please have Dr. Alyson Ingles or Dr. Diona Fanti do a script that can be printed for him to pick up.

## 2019-12-25 NOTE — Telephone Encounter (Signed)
A verbal order was taken at [place] and pt was notified.

## 2020-01-08 IMAGING — CT CT NECK W/ CM
4 of 5 series · 14 of 33 positions shown, 16 images · IV contrast (iopamidol)
Comparison: None.

CLINICAL DATA: 67-year-old male with nonproductive cough and vocal
cord paralysis since beginning lisiprinol.

EXAM:
CT NECK WITH CONTRAST
TECHNIQUE: Multidetector CT imaging of the neck was performed using the
standard protocol following the bolus administration of intravenous
contrast.
CONTRAST:  75mL [id number] IOPAMIDOL (159QTD-DQQ) INJECTION 61%

[Series 2: axial neck · axial · 0.59mm/px · z∈[+1161,+1301]mm · 4 of 118 slices shown, 5 images]
[im 24/118  soft-tissue]
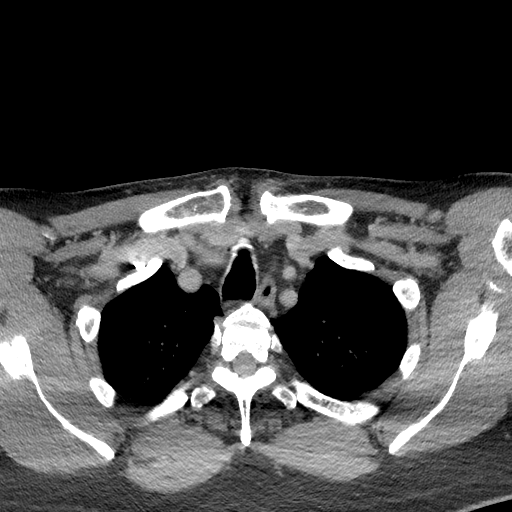
[im 24/118  bone]
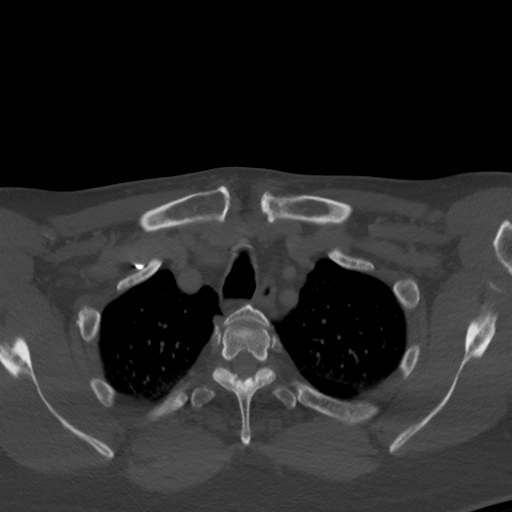
[im 47/118  bone]
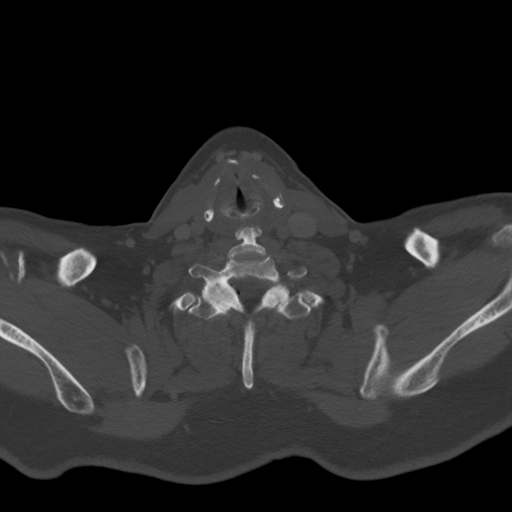
[im 71/118  bone]
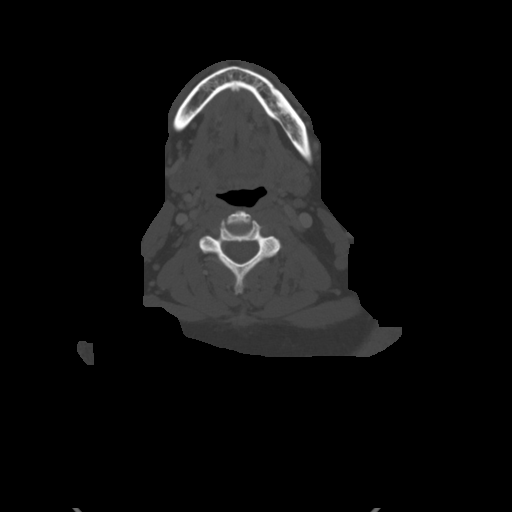
[im 94/118  bone]
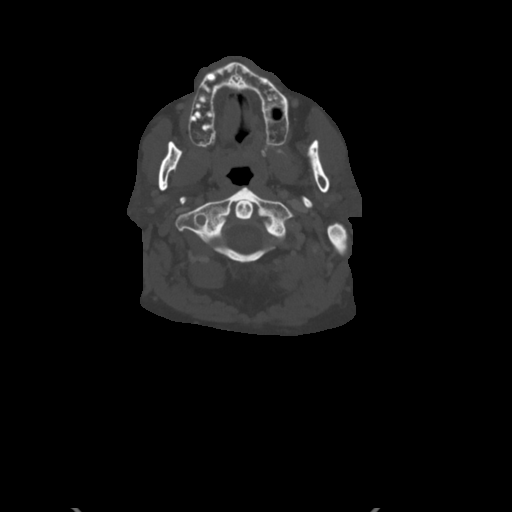

[Series 6: coronal neck · coronal · 0.46mm/px · 3 of 140 slices shown]
[im 28/140  bone]
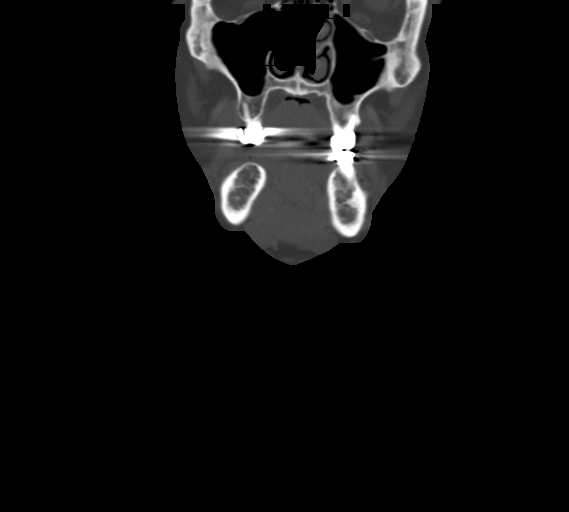
[im 56/140  bone]
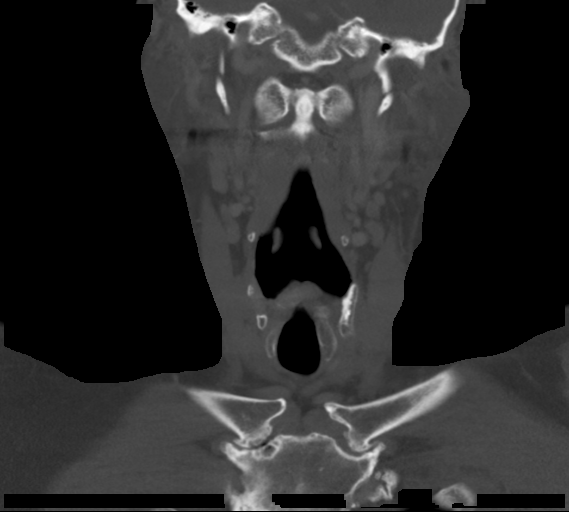
[im 84/140  bone]
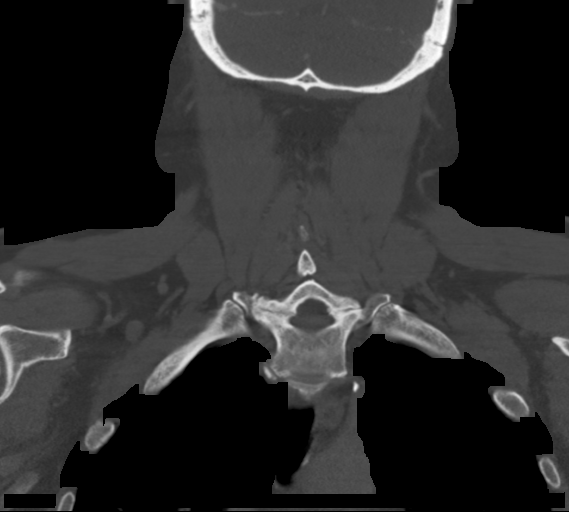

[Series 7: sagittal neck · sagittal · 0.46mm/px · 5 of 101 slices shown, 6 images]
[im 34/101  bone]
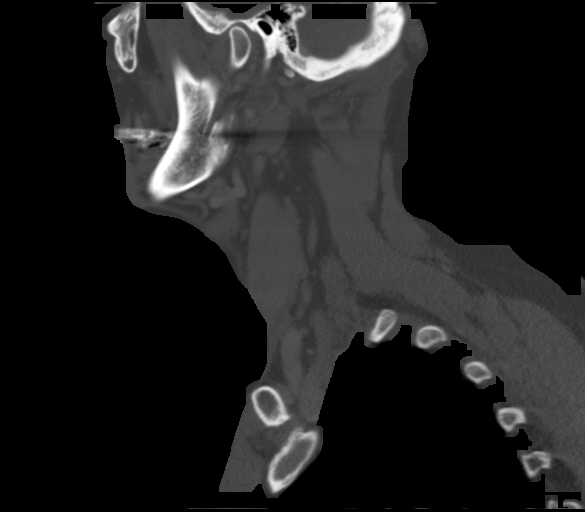
[im 42/101  bone]
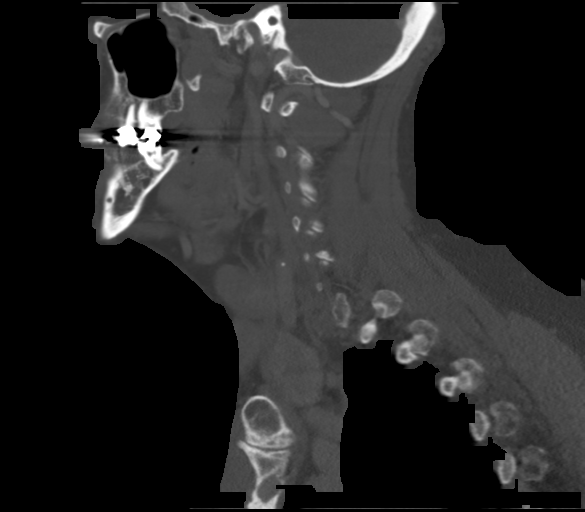
[im 51/101  soft-tissue]
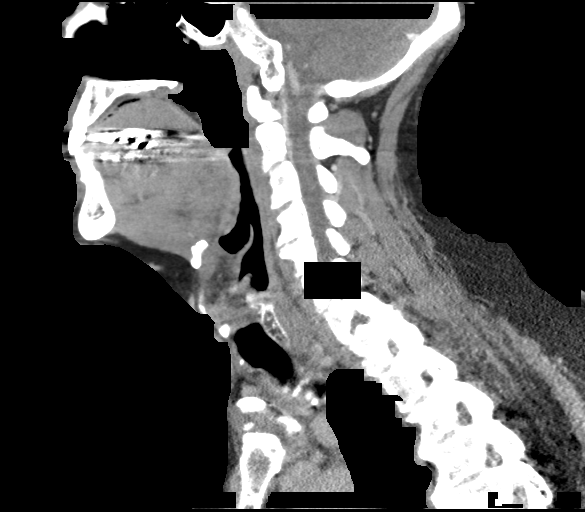
[im 51/101  bone]
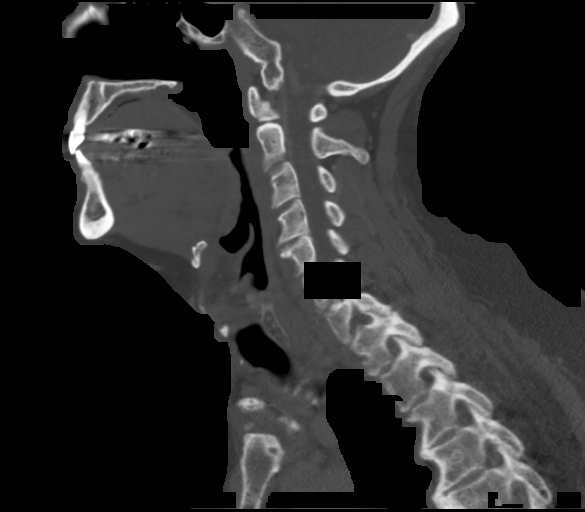
[im 59/101  bone]
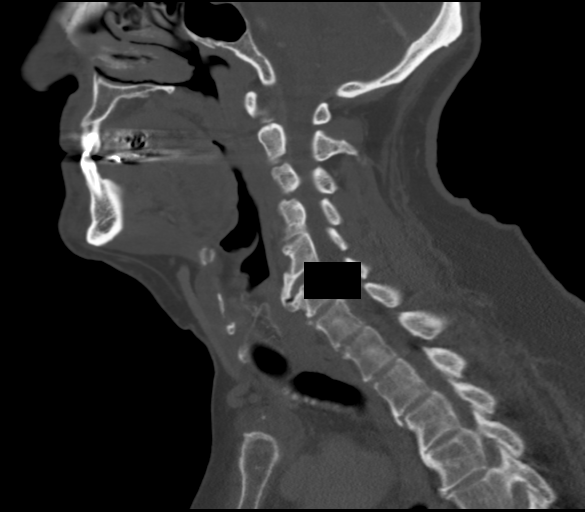
[im 67/101  bone]
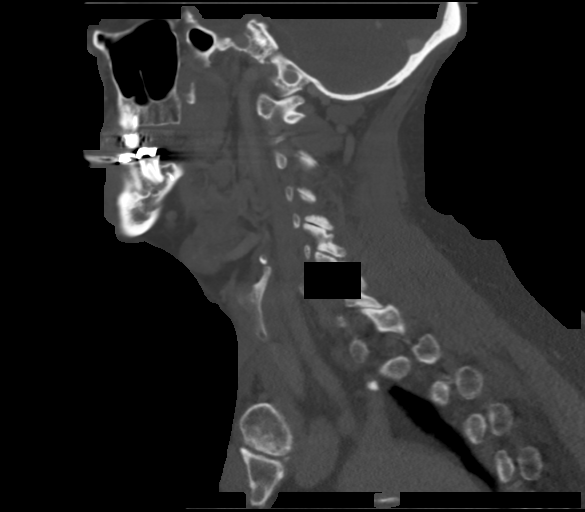

[Series 8: orthogonal ax · axial · 0.39mm/px · z∈[+1161,+1207]mm · 2 of 117 slices shown]
[im 24/117  bone]
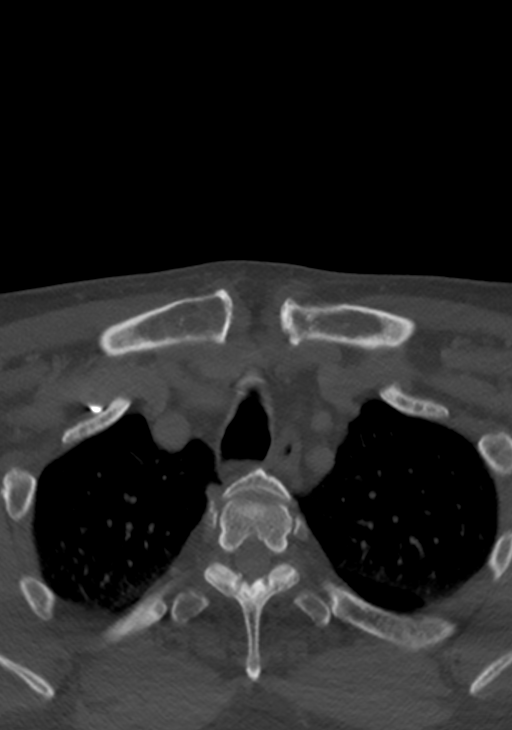
[im 47/117  bone]
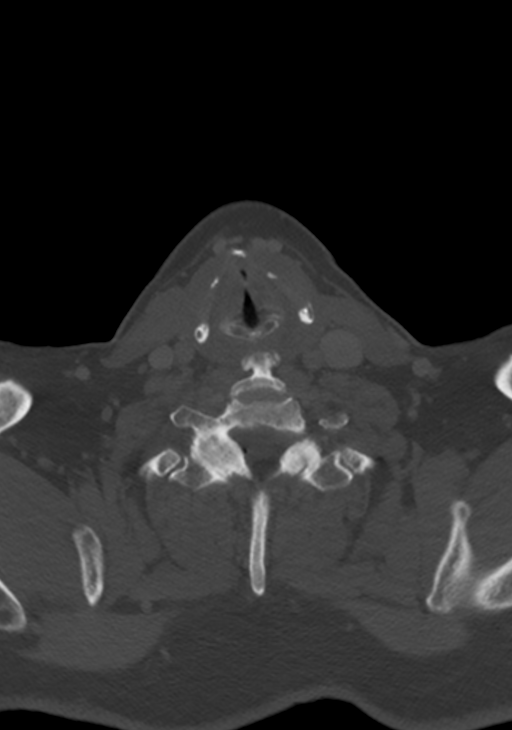

[14 of 33 positions shown; findings below may reference images not displayed]

FINDINGS: Pharynx and larynx: Symmetric appearance of the larynx with minor
motion artifact. No asymmetric deviation of the vocal cords, or
asymmetric size of the laryngeal ventricle or piriform sinuses.
Normal epiglottis. No laryngeal mass.

Pharyngeal soft tissue contours are also within normal limits.
Negative parapharyngeal and retropharyngeal spaces.

Salivary glands: Negative sublingual space, submandibular and
parotid glands.

Thyroid: Estimated 16-18 millimeter hypodense and somewhat stellate
appearing nodule in the right thyroid lobe which is asymmetrically
enlarged (series 2, image 85 and coronal image 67. Normal isthmus
and left lobe.

Lymph nodes: Negative.  No cervical lymphadenopathy.

Vascular: Venous phase predominant intravascular contrast. The major
venous vascular structures in the neck and at the skull base are
patent. Calcified atherosclerosis at both carotid bifurcations and
the skull base.

Limited intracranial: Negative.

Visualized orbits: Negative.

Mastoids and visualized paranasal sinuses: Clear. Bilateral tympanic
cavities are clear.

Skeleton: Benign appearing bone island of the right posterior body
of the mandible on series 3, image 43. Degenerative cervical spine
anterior endplate osteophytosis. No acute osseous abnormality
identified.

Upper chest: Calcified aortic atherosclerosis. Otherwise normal
visible superior mediastinum. Negative upper lungs aside from minor
dependent atelectasis.
IMPRESSION: 1. Normal larynx and negative CT appearance of the neck aside from
the right thyroid finding in #2.
2. Estimated 18 mm hypodense right thyroid nodule meets consensus
criteria for follow-up characterization with Thyroid Ultrasound.

## 2020-01-15 ENCOUNTER — Other Ambulatory Visit: Payer: Self-pay

## 2020-01-15 MED ORDER — AMBULATORY NON FORMULARY MEDICATION: 3 refills | Status: DC

## 2020-01-16 ENCOUNTER — Other Ambulatory Visit: Payer: Self-pay | Admitting: Family Medicine

## 2020-01-24 ENCOUNTER — Telehealth: Payer: Self-pay | Admitting: *Deleted

## 2020-01-24 MED ORDER — BUPROPION HCL ER (XL) 150 MG PO TB24
150.0000 mg | ORAL_TABLET | Freq: Every day | ORAL | 1 refills | Status: DC
Start: 2020-01-24 — End: 2020-07-29

## 2020-01-24 NOTE — Telephone Encounter (Signed)
Patient called and stated he found out [facility] [address] was cheaper on his Wellbutrin than [employer] and wants the script transferred. Prescription sent electronically to pharmacy.

## 2020-02-12 DIAGNOSIS — H2513 Age-related nuclear cataract, bilateral: Secondary | ICD-10-CM | POA: Diagnosis not present

## 2020-02-12 DIAGNOSIS — H16223 Keratoconjunctivitis sicca, not specified as Sjogren's, bilateral: Secondary | ICD-10-CM | POA: Diagnosis not present

## 2020-02-12 DIAGNOSIS — H5200 Hypermetropia, unspecified eye: Secondary | ICD-10-CM | POA: Diagnosis not present

## 2020-02-18 DIAGNOSIS — Z0100 Encounter for examination of eyes and vision without abnormal findings: Secondary | ICD-10-CM | POA: Diagnosis not present

## 2020-02-25 ENCOUNTER — Encounter: Payer: Self-pay | Admitting: Orthopedic Surgery

## 2020-02-25 ENCOUNTER — Telehealth: Payer: Self-pay | Admitting: Orthopedic Surgery

## 2020-02-25 ENCOUNTER — Other Ambulatory Visit: Payer: Self-pay

## 2020-02-25 ENCOUNTER — Ambulatory Visit: Payer: Medicare HMO

## 2020-02-25 ENCOUNTER — Ambulatory Visit (INDEPENDENT_AMBULATORY_CARE_PROVIDER_SITE_OTHER): Payer: Medicare HMO | Admitting: Orthopedic Surgery

## 2020-02-25 VITALS — BP 147/75 | HR 80 | Ht 67.0 in | Wt 197.0 lb

## 2020-02-25 DIAGNOSIS — M79604 Pain in right leg: Secondary | ICD-10-CM

## 2020-02-25 DIAGNOSIS — M545 Low back pain, unspecified: Secondary | ICD-10-CM | POA: Diagnosis not present

## 2020-02-25 DIAGNOSIS — Z96641 Presence of right artificial hip joint: Secondary | ICD-10-CM | POA: Diagnosis not present

## 2020-02-25 DIAGNOSIS — Z96651 Presence of right artificial knee joint: Secondary | ICD-10-CM | POA: Diagnosis not present

## 2020-02-25 MED ORDER — METHOCARBAMOL 500 MG PO TABS: 500.0000 mg | ORAL_TABLET | Freq: Three times a day (TID) | ORAL | 1 refills | Status: DC

## 2020-02-25 MED ORDER — MELOXICAM 7.5 MG PO TABS: 7.5000 mg | ORAL_TABLET | Freq: Every day | ORAL | 5 refills | Status: DC

## 2020-02-25 NOTE — Telephone Encounter (Signed)
Patient called back and wanted to know if he is to take the Naprosyn and the Meloxicam that Dr. Aline Brochure gave him today.  I told him that I didn't see that he had listed that he has been taking Naprosyn.  He said that he didn't see it on his list either.  I asked him if he had ever told the doctor that he was taking this.  He just said it was Naprosyn OTC.  I told him that I would have send a message to Dr. Aline Brochure for him to advise.  He understands and will await a call back from our office.

## 2020-02-25 NOTE — Telephone Encounter (Signed)
Called him to advise, and the Meloxicam takes the place of his Naproxen he voiced understanding

## 2020-02-25 NOTE — Patient Instructions (Signed)
Take the mobic daily and the robaxin just for symptoms

## 2020-02-25 NOTE — Progress Notes (Signed)
/OFFICE VISIT  Chief Complaint  Patient presents with  . Back Pain    right buttock pain into right knee     69 year old male had a right total hip right total knee presents with a month history of pain in his right buttock and right knee which he describes as separate.  He does seem to get symptoms when he is in the shower he says he can stand and sit okay when he is up for long time the knee will get tight and then will have trouble moving his right leg he does feel some pain in his buttock he does have a history of degenerative disc disease  He denies any treatment other than rest which seems to help the symptoms   Review of Systems  Constitutional: Negative for fever.  Neurological: Negative for sensory change and focal weakness.     Past Medical History:  Diagnosis Date  . Aortic atherosclerosis ([place]) 10/19/2016    Seen on chest x-ray July 2018  . Arthritis    right knee and hip   . Cancer [facility])    prostate cancer   . Chronic kidney disease    prostate cancer   . Diabetes mellitus without complication ([place])   . GERD (gastroesophageal reflux disease)   . Gout   . Gout   . Hypercholesteremia   . Hyperglycemia   . Hyperlipidemia   . Hypertension   . Sleep apnea   . Status post THR (total hip replacement) 04/20/13 05/01/2013   Right total hip 04/20/2013 Dr. Lydia Guiles implant     Past Surgical History:  Procedure Laterality Date  . COLONOSCOPY    . COLONOSCOPY N/A 10/22/2013   Procedure: COLONOSCOPY;  Surgeon: Danie Binder, MD;  [facility];  Service: Endoscopy;  Laterality: N/A;  1115  . HERNIA REPAIR     right inguinal hernia repair   . NASAL SEPTUM SURGERY    . OTHER SURGICAL HISTORY     deviated septum surgery   . ROBOT ASSISTED LAPAROSCOPIC RADICAL PROSTATECTOMY  04/20/2011   Procedure: ROBOTIC ASSISTED LAPAROSCOPIC RADICAL PROSTATECTOMY;  Surgeon: Malka So, MD;  Location: [facility] ORS;  Service: Urology;  Laterality: N/A;  Bilateral lymph node  dissection  . TOTAL HIP ARTHROPLASTY Right 04/20/2013   Procedure: TOTAL HIP ARTHROPLASTY;  Surgeon: Carole Civil, MD;  Location: [facility] ORS;  Service: Orthopedics;  Laterality: Right;  . TOTAL KNEE ARTHROPLASTY Right 05/16/2018   Procedure: TOTAL KNEE ARTHROPLASTY;  Surgeon: Carole Civil, MD;  Location: [facility] ORS;  Service: Orthopedics;  Laterality: Right;    Family History  Problem Relation Age of Onset  . Diabetes Mother   . Hypertension Mother   . Hypertension Brother    Social History   Tobacco Use  . Smoking status: Former Smoker    Packs/day: 0.50    Years: 8.00    Pack years: 4.00    Types: Cigarettes    Quit date: 03/22/1984    Years since quitting: 35.9  . Smokeless tobacco: Never Used  Vaping Use  . Vaping Use: Never used  Substance Use Topics  . Alcohol use: Yes    Comment: occasional   . Drug use: No    Allergies  Allergen Reactions  . Citalopram     Fatigue headache   . Lipitor [Atorvastatin] Other (See Comments)    Myalgia   . Lisinopril Cough    Dry cough    Current Meds  Medication Sig  . allopurinol (  ZYLOPRIM) 100 MG tablet Take 1 tablet (100 mg total) by mouth daily.  . AMBULATORY NON FORMULARY MEDICATION Medication Name: Testosterone 4% Gel  Apply 1.61WR ( 3 clicks) topically daily as directed  . amLODipine (NORVASC) 5 MG tablet TAKE 1 TABLET EVERY DAY (NEEDS APPOINTMENT IN [place])  . aspirin 81 MG chewable tablet Chew 81 mg by mouth daily.   [name] atorvastatin (LIPITOR) 40 MG tablet 1 daily  . B Complex-C (SUPER B COMPLEX PO) Take 1 tablet by mouth daily.  [name] buPROPion (WELLBUTRIN XL) 150 MG 24 hr tablet Take 1 tablet (150 mg total) by mouth daily.  . hydrochlorothiazide (HYDRODIURIL) 25 MG tablet Take 1 tablet (25 mg total) by mouth daily.  . Multiple Vitamin (MULTIVITAMIN) tablet Take 1 tablet by mouth daily.   . potassium chloride (KLOR-CON) 10 MEQ tablet 1 daily  . sildenafil (REVATIO) 20 MG tablet 1-5 po prn    BP (!) 147/75   Pulse  80   Ht 5\' 7"  (1.702 m)   Wt 197 lb (89.4 kg)   BMI 30.85 kg/m   Physical Exam Vitals and nursing note reviewed.  Constitutional:      General: He is not in acute distress.    Appearance: He is well-developed.  Cardiovascular:     Comments: No peripheral edema Skin:    General: Skin is warm and dry.  Neurological:     Mental Status: He is alert and oriented to person, place, and time.     Sensory: No sensory deficit.     Coordination: Coordination normal.     Gait: Gait abnormal.     Deep Tendon Reflexes: Reflexes are normal and symmetric.     Comments: Kasandra Knudsen as needed for ambulation walks with a slight limp     Ortho Exam  No real back tenderness does have some pain over his right lateral thigh buttock and lateral knee no knee effusion  Knee moves great except for the last 5 degrees of extension feels stable is neurovascularly intact  MEDICAL DECISION MAKING  A.  Encounter Diagnoses  Name Primary?  . Low back pain radiating to right leg   . Pain in right leg Yes  . Status post right knee replacement 05/16/18   . S/P hip replacement, right 04/20/13     B. DATA ANALYSED:   IMAGING: Interpretation of images: Spine images show degenerative disc disease excessive lordosis mild scoliosis Orders: no Outside records reviewed: no   C. MANAGEMENT   Recommend medication follow-up in 6 weeks.  This is related to blood back with a nerve root irritation his implants seemed fine  Meds ordered this encounter  Medications  . methocarbamol (ROBAXIN) 500 MG tablet    Sig: Take 1 tablet (500 mg total) by mouth 3 (three) times daily.    Dispense:  60 tablet    Refill:  1  . meloxicam (MOBIC) 7.5 MG tablet    Sig: Take 1 tablet (7.5 mg total) by mouth daily.    Dispense:  30 tablet    Refill:  5      [name], MD  02/25/2020 9:00 AM

## 2020-03-13 ENCOUNTER — Other Ambulatory Visit: Payer: Self-pay | Admitting: Orthopedic Surgery

## 2020-03-13 DIAGNOSIS — M79604 Pain in right leg: Secondary | ICD-10-CM

## 2020-03-13 NOTE — Telephone Encounter (Signed)
Rx request 

## 2020-04-03 ENCOUNTER — Encounter: Payer: Self-pay | Admitting: Orthopedic Surgery

## 2020-04-03 ENCOUNTER — Ambulatory Visit: Payer: Medicare HMO | Admitting: Orthopedic Surgery

## 2020-04-03 ENCOUNTER — Other Ambulatory Visit: Payer: Self-pay

## 2020-04-03 DIAGNOSIS — M79604 Pain in right leg: Secondary | ICD-10-CM

## 2020-04-03 MED ORDER — MELOXICAM 7.5 MG PO TABS: 7.5000 mg | ORAL_TABLET | Freq: Every day | ORAL | 5 refills | Status: DC

## 2020-04-03 NOTE — Patient Instructions (Signed)
Take meloxicam daily

## 2020-04-03 NOTE — Progress Notes (Signed)
FOLLOW-UP OFFICE VISIT   Encounter Diagnosis  Name Primary?  . Pain in right leg     70 year old male presents for follow-up after 6 weeks for pain in his right leg  Patient has had right total knee right total hip but was found to have radicular pain in his right leg we treated him with Robaxin and meloxicam.  About 2 weeks ago his symptoms completely resolved  (and prior treatment)  + EXAM FINDINGS: He is walking normally his tiptoe test were normal he has no tenderness in his back  ASSESSMENT AND PLAN Encounter Diagnosis  Name Primary?  . Pain in right leg     Continue meloxicam  Meds ordered this encounter  Medications  . meloxicam (MOBIC) 7.5 MG tablet    Sig: Take 1 tablet (7.5 mg total) by mouth daily.    Dispense:  30 tablet    Refill:  5

## 2020-04-29 ENCOUNTER — Telehealth: Payer: Self-pay | Admitting: Family Medicine

## 2020-04-29 DIAGNOSIS — E785 Hyperlipidemia, unspecified: Secondary | ICD-10-CM

## 2020-04-29 DIAGNOSIS — I10 Essential (primary) hypertension: Secondary | ICD-10-CM

## 2020-04-29 DIAGNOSIS — M1A9XX0 Chronic gout, unspecified, without tophus (tophi): Secondary | ICD-10-CM

## 2020-04-29 DIAGNOSIS — R7303 Prediabetes: Secondary | ICD-10-CM

## 2020-04-29 DIAGNOSIS — Z79899 Other long term (current) drug therapy: Secondary | ICD-10-CM

## 2020-04-29 NOTE — Telephone Encounter (Signed)
Patient has 6 month follow up 3/14 and needing labs done

## 2020-04-29 NOTE — Telephone Encounter (Signed)
Lipid, CMP, uric acid, A1c Hyperlipidemia, hypertension, prediabetes, gout, fasting hyperglycemia

## 2020-04-30 NOTE — Telephone Encounter (Signed)
Blood work ordered in [employer]. Telephone call- voicemail is full

## 2020-04-30 NOTE — Telephone Encounter (Signed)
Patient notified

## 2020-05-06 NOTE — Progress Notes (Signed)
Subjective:  1. History of prostate cancer   2. Hypogonadism in male   3. Urge incontinence       [name] [name] returns today in f/u for his history of hypogonadism and prostate cancer. He is s/p a RALP on 04/20/11. His PSA was <0.1 in 8/21 at last check. He had Gleason 7(3+4) T2c N0 disease. He is doing well and has no SUI but can have UUI and has noted that once he gets the urge he can only hold it q15-20 min. He has no hematuria or dysuria. He has a good stream.   He has had some erectile dysfunction that he manages with sildenafil 20mg .   He remains on compounded testosterone 4% 3 clicks daily. His T level was 214 and his Hgb was 14.4 in 8/21.   He is doing well with improved energy.   .  IPSS    Row Name 05/08/20 1400         International Prostate Symptom Score   How often have you had the sensation of not emptying your bladder? Less than half the time     How often have you had to urinate less than every two hours? Less than half the time     How often have you found you stopped and started again several times when you urinated? Not at All     How often have you found it difficult to postpone urination? About half the time     How often have you had a weak urinary stream? Not at All     How often have you had to strain to start urination? Not at All     How many times did you typically get up at night to urinate? 3 Times     Total IPSS Score 10           Quality of Life due to urinary symptoms   If you were to spend the rest of your life with your urinary condition just the way it is now how would you feel about that? Mostly Satisfied                ROS:  ROS:  A complete review of systems was performed.  All systems are negative except for pertinent findings as noted.   Review of Systems    Allergies  Allergen Reactions  . Citalopram     Fatigue headache   . Lipitor [Atorvastatin] Other (See Comments)    Myalgia   . Lisinopril Cough    Dry cough     Outpatient Encounter Medications as of 05/08/2020  Medication Sig  . allopurinol (ZYLOPRIM) 100 MG tablet Take 1 tablet (100 mg total) by mouth daily.  . AMBULATORY NON FORMULARY MEDICATION Medication Name: Testosterone 4% Gel  Apply 6.94HW ( 3 clicks) topically daily as directed  . amLODipine (NORVASC) 5 MG tablet TAKE 1 TABLET EVERY DAY (NEEDS APPOINTMENT IN [place])  . aspirin 81 MG chewable tablet Chew 81 mg by mouth daily.   [name] atorvastatin (LIPITOR) 40 MG tablet 1 daily  . B Complex-C (SUPER B COMPLEX PO) Take 1 tablet by mouth daily.  [name] buPROPion (WELLBUTRIN XL) 150 MG 24 hr tablet Take 1 tablet (150 mg total) by mouth daily.  . hydrochlorothiazide (HYDRODIURIL) 25 MG tablet Take 1 tablet (25 mg total) by mouth daily.  . meloxicam (MOBIC) 7.5 MG tablet Take 1 tablet (7.5 mg total) by mouth daily.  . methocarbamol (ROBAXIN) 500 MG tablet TAKE (1) TABLET  BY MOUTH (3) TIMES DAILY.  . Multiple Vitamin (MULTIVITAMIN) tablet Take 1 tablet by mouth daily.  . potassium chloride (KLOR-CON) 10 MEQ tablet 1 daily  . sildenafil (REVATIO) 20 MG tablet 1-5 po prn  . sildenafil (REVATIO) 20 MG tablet 1-5 po daily prn  . Testosterone 20 % CREA Apply 3 mLs topically in the morning.   No facility-administered encounter medications on file as of 05/08/2020.    Past Medical History:  Diagnosis Date  . Aortic atherosclerosis ([place]) 10/19/2016    Seen on chest x-ray July 2018  . Arthritis    right knee and hip   . Cancer [facility])    prostate cancer   . Chronic kidney disease    prostate cancer   . Diabetes mellitus without complication ([place])   . GERD (gastroesophageal reflux disease)   . Gout   . Gout   . Hypercholesteremia   . Hyperglycemia   . Hyperlipidemia   . Hypertension   . Sleep apnea   . Status post THR (total hip replacement) 04/20/13 05/01/2013   Right total hip 04/20/2013 Dr. Lydia Guiles implant     Past Surgical History:  Procedure Laterality Date  . COLONOSCOPY    .  COLONOSCOPY N/A 10/22/2013   Procedure: COLONOSCOPY;  Surgeon: Danie Binder, MD;  Location[facility];  Service: Endoscopy;  Laterality: N/A;  1115  . HERNIA REPAIR     right inguinal hernia repair   . NASAL SEPTUM SURGERY    . OTHER SURGICAL HISTORY     deviated septum surgery   . ROBOT ASSISTED LAPAROSCOPIC RADICAL PROSTATECTOMY  04/20/2011   Procedure: ROBOTIC ASSISTED LAPAROSCOPIC RADICAL PROSTATECTOMY;  Surgeon: Malka So, MD;  Location: [facility];  Service: Urology;  Laterality: N/A;  Bilateral lymph node dissection  . TOTAL HIP ARTHROPLASTY Right 04/20/2013   Procedure: TOTAL HIP ARTHROPLASTY;  Surgeon: Carole Civil, MD;  Location: [facility] ORS;  Service: Orthopedics;  Laterality: Right;  . TOTAL KNEE ARTHROPLASTY Right 05/16/2018   Procedure: TOTAL KNEE ARTHROPLASTY;  Surgeon: Carole Civil, MD;  Location: [facility] ORS;  Service: Orthopedics;  Laterality: Right;    Social History   Socioeconomic History  . Marital status: Single    Spouse name: Not on file  . Number of children: Not on file  . Years of education: Not on file  . Highest education level: Not on file  Occupational History  . Not on file  Tobacco Use  . Smoking status: Former Smoker    Packs/day: 0.50    Years: 8.00    Pack years: 4.00    Types: Cigarettes    Quit date: 03/22/1984    Years since quitting: 36.1  . Smokeless tobacco: Never Used  Vaping Use  . Vaping Use: Never used  Substance and Sexual Activity  . Alcohol use: Yes    Comment: occasional   . Drug use: No  . Sexual activity: Yes    Birth control/protection: None  Other Topics Concern  . Not on file  Social History Narrative  . Not on file   Social Determinants of Health   Financial Resource Strain: Not on file  Food Insecurity: Not on file  Transportation Needs: Not on file  Physical Activity: Not on file  Stress: Not on file  Social Connections: Not on file  Intimate Partner Violence: Not on file    Family History  Problem  Relation Age of Onset  . Diabetes Mother   . Hypertension Mother   .  Hypertension Brother        Objective: BP (!) 143/64   Pulse 88   Temp 98.8 F (37.1 C)   Ht 5\' 7"  (1.702 m)   Wt 195 lb (88.5 kg)   BMI 30.54 kg/m     Physical Exam  Lab Results:  No results found for this or any previous visit (from the past 24 hour(s)).  BMET No results for input(s): NA, K, CL, CO2, GLUCOSE, BUN, CREATININE, CALCIUM in the last 72 hours. PSA PSA  Date Value Ref Range Status  05/09/2019 <0.1 < OR = 4.0 ng/mL Final    Comment:    The total PSA value from this assay system is  standardized against the WHO standard. The test  result will be approximately 20% lower when compared  to the equimolar-standardized total PSA (Beckman  Coulter). Comparison of serial PSA results should be  interpreted with this fact in mind. . This test was performed using the Siemens  chemiluminescent method. Values obtained from  different assay methods cannot be used interchangeably. PSA levels, regardless of value, should not be interpreted as absolute evidence of the presence or absence of disease.    Testosterone  Date Value Ref Range Status  11/08/2019 214 (L) 264 - 916 ng/dL Final    Comment:    Adult male reference interval is based on a population of healthy nonobese males (BMI <30) between 41 and 12 years old. [place], [place] [phone]. PMID: [id number].   05/09/2019 214 (L) 250 - 827 ng/dL Final    Comment:    In hypogonadal males, Testosterone, Total, LC/MS/MS, is the recommended assay due to the diminished accuracy of immunoassay at levels below 250 ng/dL. This test code [id number]) must be collected in a red-top tube with no gel.     Hgb is 14.4.  UA is clear.  Results for orders placed or performed in visit on 05/08/20 (from the past 24 hour(s))  Urinalysis, Routine w reflex microscopic     Status: Abnormal   Collection Time: 05/08/20  2:14 PM  Result Value Ref  Range   Specific Gravity, UA 1.010 1.005 - 1.030   pH, UA 5.0 5.0 - 7.5   Color, UA Yellow Yellow   Appearance Ur Clear Clear   Leukocytes,UA Negative Negative   Protein,UA Negative Negative/Trace   Glucose, UA Negative Negative   Ketones, UA Negative Negative   RBC, UA Trace (A) Negative   Bilirubin, UA Negative Negative   Urobilinogen, Ur 0.2 0.2 - 1.0 mg/dL   Nitrite, UA Negative Negative   Microscopic Examination See below:    Narrative   Performed at:  [facility] [address] [place], [place]  [id number] Lab Director: Mina Marble MT, Phone:  [phone]  Microscopic Examination     Status: None   Collection Time: 05/08/20  2:14 PM   Urine  Result Value Ref Range   WBC, UA None seen 0 - 5 /hpf   RBC 0-2 0 - 2 /hpf   Epithelial Cells (non renal) None seen 0 - 10 /hpf   Renal Epithel, UA None seen None seen /hpf   Bacteria, UA None seen None seen/Few   Narrative   Performed at:  [facility] [address] [place], [place]  [id number] Lab Director: Ger[place], Phone:  [phone]    Studies/Results: No results found.    Assessment & Plan: Hypogonadism.  He is doing well on therapy.  Med refilled.  I  will get labs today and in 6 months prior to f/u.  ED.  Sildenafil refilled.  Hx of prostate cancer.  PSA was undetectible at last check and will be repeated today and in 3 months.      Meds ordered this encounter  Medications  . Testosterone 20 % CREA    Sig: Apply 3 mLs topically in the morning.    Dispense:  100 g    Refill:  5  . sildenafil (REVATIO) 20 MG tablet    Sig: 1-5 po daily prn    Dispense:  30 tablet    Refill:  11     Orders Placed This Encounter  Procedures  . Urinalysis, Routine w reflex microscopic  . Hemoglobin and hematocrit, blood  . Testosterone  . PSA  . PSA    Standing Status:   Future    Standing Expiration Date:   05/08/2021  . Testosterone    Standing Status:   Future    Standing  Expiration Date:   05/08/2021  . Hemoglobin and hematocrit, blood    Standing Status:   Future    Standing Expiration Date:   05/08/2021      Return in about 6 months (around 11/05/2020) for with labs..   CC: Kathyrn Drown, MD      Irine Seal 05/08/2020

## 2020-05-08 ENCOUNTER — Ambulatory Visit (INDEPENDENT_AMBULATORY_CARE_PROVIDER_SITE_OTHER): Payer: Medicare HMO | Admitting: Urology

## 2020-05-08 ENCOUNTER — Encounter: Payer: Self-pay | Admitting: Urology

## 2020-05-08 ENCOUNTER — Other Ambulatory Visit: Payer: Self-pay

## 2020-05-08 VITALS — BP 143/64 | HR 88 | Temp 98.8°F | Ht 67.0 in | Wt 195.0 lb

## 2020-05-08 DIAGNOSIS — N521 Erectile dysfunction due to diseases classified elsewhere: Secondary | ICD-10-CM | POA: Diagnosis not present

## 2020-05-08 DIAGNOSIS — Z8546 Personal history of malignant neoplasm of prostate: Secondary | ICD-10-CM

## 2020-05-08 DIAGNOSIS — N3941 Urge incontinence: Secondary | ICD-10-CM

## 2020-05-08 DIAGNOSIS — E291 Testicular hypofunction: Secondary | ICD-10-CM

## 2020-05-08 LAB — URINALYSIS, ROUTINE W REFLEX MICROSCOPIC
Bilirubin, UA: NEGATIVE
Glucose, UA: NEGATIVE
Ketones, UA: NEGATIVE
Leukocytes,UA: NEGATIVE
Nitrite, UA: NEGATIVE
Protein,UA: NEGATIVE
Specific Gravity, UA: 1.01 (ref 1.005–1.030)
Urobilinogen, Ur: 0.2 mg/dL (ref 0.2–1.0)
pH, UA: 5 (ref 5.0–7.5)

## 2020-05-08 LAB — MICROSCOPIC EXAMINATION
Bacteria, UA: NONE SEEN
Epithelial Cells (non renal): NONE SEEN /hpf (ref 0–10)
Renal Epithel, UA: NONE SEEN /hpf
WBC, UA: NONE SEEN /hpf (ref 0–5)

## 2020-05-08 MED ORDER — TESTOSTERONE 20 % CREA
3.0000 mL | TOPICAL_CREAM | Freq: Every morning | 5 refills | Status: DC
Start: 2020-05-08 — End: 2020-05-09

## 2020-05-08 MED ORDER — SILDENAFIL CITRATE 20 MG PO TABS
ORAL_TABLET | ORAL | 11 refills | Status: DC
Start: 2020-05-08 — End: 2020-08-04

## 2020-05-08 NOTE — Progress Notes (Signed)
Urological Symptom Review  Patient is experiencing the following symptoms: Hard to postpone urination Get up at night to urinate Leakage of urine Erection problems (male only)   Review of Systems  Gastrointestinal (upper)  : Negative for upper GI symptoms  Gastrointestinal (lower) : Negative for lower GI symptoms  Constitutional : Negative for symptoms  Skin: Negative for skin symptoms  Eyes: Negative for eye symptoms  Ear/Nose/Throat : Negative for Ear/Nose/Throat symptoms  Hematologic/Lymphatic: Negative for Hematologic/Lymphatic symptoms  Cardiovascular : Negative for cardiovascular symptoms  Respiratory : Negative for respiratory symptoms  Endocrine: Negative for endocrine symptoms  Musculoskeletal: Negative for musculoskeletal symptoms  Neurological: Negative for neurological symptoms  Psychologic: Anxiety

## 2020-05-09 ENCOUNTER — Ambulatory Visit: Payer: Medicare HMO | Admitting: Urology

## 2020-05-09 ENCOUNTER — Telehealth: Payer: Self-pay

## 2020-05-09 ENCOUNTER — Ambulatory Visit: Payer: Medicare HMO | Admitting: Orthopedic Surgery

## 2020-05-09 ENCOUNTER — Other Ambulatory Visit: Payer: Self-pay

## 2020-05-09 DIAGNOSIS — E291 Testicular hypofunction: Secondary | ICD-10-CM

## 2020-05-09 LAB — HEMOGLOBIN AND HEMATOCRIT, BLOOD
Hematocrit: 43.9 % (ref 37.5–51.0)
Hemoglobin: 14.7 g/dL (ref 13.0–17.7)

## 2020-05-09 LAB — TESTOSTERONE: Testosterone: 308 ng/dL (ref 264–916)

## 2020-05-09 LAB — PSA: Prostate Specific Ag, Serum: <0.1 ng/mL (ref 0.0–4.0)

## 2020-05-09 MED ORDER — AMBULATORY NON FORMULARY MEDICATION: 3 refills | Status: DC

## 2020-05-09 NOTE — Progress Notes (Signed)
Sent via mychart

## 2020-05-09 NOTE — Telephone Encounter (Signed)
-----   Message from Irine Seal, MD sent at 05/09/2020 11:15 AM EST ----- Regarding: RE: testosterone question My bad.   He can have a refill of the prior level.  No increase needed.   ----- Message ----- From: Dorisann Frames, RN Sent: 05/09/2020  11:14 AM EST To: Irine Seal, MD Subject: testosterone question                          Pt called- current on 4% testosterone - he saw you yesterday and you wrote for 20% ( Im assuming bc that's what you found in Epic) May I renew the current rx he is on instead of 20% pt is confused.  I didn't see an increase in your note but wanted to be sure before cancelling the 20%  Thanks

## 2020-05-12 ENCOUNTER — Ambulatory Visit: Payer: Medicare HMO

## 2020-05-12 ENCOUNTER — Ambulatory Visit (INDEPENDENT_AMBULATORY_CARE_PROVIDER_SITE_OTHER): Payer: Medicare HMO | Admitting: Orthopedic Surgery

## 2020-05-12 ENCOUNTER — Encounter: Payer: Self-pay | Admitting: Orthopedic Surgery

## 2020-05-12 ENCOUNTER — Other Ambulatory Visit: Payer: Self-pay

## 2020-05-12 VITALS — BP 139/74 | HR 93 | Ht 67.0 in | Wt 205.0 lb

## 2020-05-12 DIAGNOSIS — Z96651 Presence of right artificial knee joint: Secondary | ICD-10-CM | POA: Diagnosis not present

## 2020-05-12 DIAGNOSIS — M1711 Unilateral primary osteoarthritis, right knee: Secondary | ICD-10-CM

## 2020-05-12 DIAGNOSIS — M1611 Unilateral primary osteoarthritis, right hip: Secondary | ICD-10-CM

## 2020-05-12 DIAGNOSIS — Z96641 Presence of right artificial hip joint: Secondary | ICD-10-CM

## 2020-05-12 NOTE — Progress Notes (Signed)
Chief Complaint  Patient presents with  . Knee Pain    R/ not hurting today, here for the yearly followup.   70 year old male right total hip 2015 DePuy Tri-Lock stem Pinnacle cup press-fit both sides of the joint  Right total knee 2020 DePuy Sigma fixed-bearing PS knee  No complaints today. Encounter Diagnoses  Name Primary?  . Status post right knee replacement 05/16/18 Yes  . Status post total replacement of right hip 04/20/13   . Unilateral primary osteoarthritis, right knee   . Unilateral primary osteoarthritis, right hip    Range of motion right and left knee continues to be normal with no swelling or restrictions no pain with range of motion gait unremarkable  X-rays right hip right knee no signs of loosening  Patient can be followed and he agrees at 2-year interval

## 2020-05-15 ENCOUNTER — Other Ambulatory Visit: Payer: Self-pay | Admitting: Family Medicine

## 2020-05-27 DIAGNOSIS — I10 Essential (primary) hypertension: Secondary | ICD-10-CM | POA: Diagnosis not present

## 2020-05-27 DIAGNOSIS — R7303 Prediabetes: Secondary | ICD-10-CM | POA: Diagnosis not present

## 2020-05-27 DIAGNOSIS — E785 Hyperlipidemia, unspecified: Secondary | ICD-10-CM | POA: Diagnosis not present

## 2020-05-27 DIAGNOSIS — Z79899 Other long term (current) drug therapy: Secondary | ICD-10-CM | POA: Diagnosis not present

## 2020-05-27 DIAGNOSIS — M1A9XX0 Chronic gout, unspecified, without tophus (tophi): Secondary | ICD-10-CM | POA: Diagnosis not present

## 2020-05-28 LAB — COMPREHENSIVE METABOLIC PANEL
ALT: 16 IU/L (ref 0–44)
AST: 16 IU/L (ref 0–40)
Albumin/Globulin Ratio: 1.7 (ref 1.2–2.2)
Albumin: 4.5 g/dL (ref 3.7–4.7)
Alkaline Phosphatase: 110 IU/L (ref 44–121)
BUN/Creatinine Ratio: 14 (ref 10–24)
BUN: 17 mg/dL (ref 8–27)
Bilirubin Total: 1.3 mg/dL — ABNORMAL HIGH (ref 0.0–1.2)
CO2: 26 mmol/L (ref 20–29)
Calcium: 9.1 mg/dL (ref 8.6–10.2)
Chloride: 100 mmol/L (ref 96–106)
Creatinine, Ser: 1.2 mg/dL (ref 0.76–1.27)
Globulin, Total: 2.7 g/dL (ref 1.5–4.5)
Glucose: 106 mg/dL — ABNORMAL HIGH (ref 65–99)
Potassium: 4.5 mmol/L (ref 3.5–5.2)
Sodium: 140 mmol/L (ref 134–144)
Total Protein: 7.2 g/dL (ref 6.0–8.5)
eGFR: 64 mL/min/{1.73_m2} (ref >59)

## 2020-05-28 LAB — HEMOGLOBIN A1C
Est. average glucose Bld gHb Est-mCnc: 114 mg/dL
Hgb A1c MFr Bld: 5.6 % (ref 4.8–5.6)

## 2020-05-28 LAB — URIC ACID: Uric Acid: 5.9 mg/dL (ref 3.8–8.4)

## 2020-05-28 LAB — LIPID PANEL
Chol/HDL Ratio: 3.6 ratio (ref 0.0–5.0)
Cholesterol, Total: 178 mg/dL (ref 100–199)
HDL: 50 mg/dL (ref >39)
LDL Chol Calc (NIH): 110 mg/dL — ABNORMAL HIGH (ref 0–99)
Triglycerides: 97 mg/dL (ref 0–149)
VLDL Cholesterol Cal: 18 mg/dL (ref 5–40)

## 2020-06-02 ENCOUNTER — Other Ambulatory Visit: Payer: Self-pay

## 2020-06-02 ENCOUNTER — Ambulatory Visit (INDEPENDENT_AMBULATORY_CARE_PROVIDER_SITE_OTHER): Payer: Medicare HMO | Admitting: Family Medicine

## 2020-06-02 ENCOUNTER — Encounter: Payer: Self-pay | Admitting: Family Medicine

## 2020-06-02 VITALS — BP 128/60 | HR 94 | Temp 97.9°F | Ht 67.0 in | Wt 204.0 lb

## 2020-06-02 DIAGNOSIS — E785 Hyperlipidemia, unspecified: Secondary | ICD-10-CM | POA: Diagnosis not present

## 2020-06-02 DIAGNOSIS — M170 Bilateral primary osteoarthritis of knee: Secondary | ICD-10-CM

## 2020-06-02 DIAGNOSIS — M1A9XX0 Chronic gout, unspecified, without tophus (tophi): Secondary | ICD-10-CM

## 2020-06-02 DIAGNOSIS — I10 Essential (primary) hypertension: Secondary | ICD-10-CM

## 2020-06-02 DIAGNOSIS — R7303 Prediabetes: Secondary | ICD-10-CM | POA: Diagnosis not present

## 2020-06-02 DIAGNOSIS — M79604 Pain in right leg: Secondary | ICD-10-CM | POA: Diagnosis not present

## 2020-06-02 DIAGNOSIS — F325 Major depressive disorder, single episode, in full remission: Secondary | ICD-10-CM

## 2020-06-02 DIAGNOSIS — I700 Atherosclerosis of aorta: Secondary | ICD-10-CM

## 2020-06-02 MED ORDER — METHOCARBAMOL 500 MG PO TABS
500.0000 mg | ORAL_TABLET | Freq: Three times a day (TID) | ORAL | 0 refills | Status: DC | PRN
Start: 2020-06-02 — End: 2020-11-06

## 2020-06-02 NOTE — Progress Notes (Signed)
Subjective:    Patient ID: [name] [name], male    DOB: April 26, 1950, 70 y.o.   MRN: [id number]  Hyperlipidemia This is a chronic problem. The current episode started more than 1 year ago. Pertinent negatives include no chest pain or shortness of breath. Treatments tried: atorvastatin.   Results for orders placed or performed in visit on 05/08/20  Microscopic Examination   Urine  Result Value Ref Range   WBC, UA None seen 0 - 5 /hpf   RBC 0-2 0 - 2 /hpf   Epithelial Cells (non renal) None seen 0 - 10 /hpf   Renal Epithel, UA None seen None seen /hpf   Bacteria, UA None seen None seen/Few  Urinalysis, Routine w reflex microscopic  Result Value Ref Range   Specific Gravity, UA 1.010 1.005 - 1.030   pH, UA 5.0 5.0 - 7.5   Color, UA Yellow Yellow   Appearance Ur Clear Clear   Leukocytes,UA Negative Negative   Protein,UA Negative Negative/Trace   Glucose, UA Negative Negative   Ketones, UA Negative Negative   RBC, UA Trace (A) Negative   Bilirubin, UA Negative Negative   Urobilinogen, Ur 0.2 0.2 - 1.0 mg/dL   Nitrite, UA Negative Negative   Microscopic Examination See below:   Hemoglobin and hematocrit, blood  Result Value Ref Range   Hemoglobin 14.7 13.0 - 17.7 g/dL   Hematocrit 43.9 37.5 - 51.0 %  Testosterone  Result Value Ref Range   Testosterone 308 264 - 916 ng/dL  PSA  Result Value Ref Range   Prostate Specific Ag, Serum <0.1 0.0 - 4.0 ng/mL      Review of Systems  Constitutional: Positive for fatigue. Negative for activity change.  HENT: Negative for congestion and rhinorrhea.   Respiratory: Negative for cough and shortness of breath.   Cardiovascular: Negative for chest pain.  Gastrointestinal: Negative for abdominal pain, diarrhea, nausea and vomiting.  Genitourinary: Negative for dysuria and hematuria.  Musculoskeletal: Positive for arthralgias.  Neurological: Negative for weakness and headaches.  Psychiatric/Behavioral: Negative for behavioral problems  and confusion.       Objective:   Physical Exam Vitals reviewed.  Cardiovascular:     Rate and Rhythm: Normal rate and regular rhythm.     Heart sounds: Normal heart sounds. No murmur heard.   Pulmonary:     Effort: Pulmonary effort is normal.     Breath sounds: Normal breath sounds.  Lymphadenopathy:     Cervical: No cervical adenopathy.  Neurological:     Mental Status: He is alert.  Psychiatric:        Behavior: Behavior normal.    Fall Risk  06/02/2020 11/01/2019 10/30/2018 10/06/2015 04/07/2015  Falls in the past year? - 0 0 No No  Number falls in past yr: 0 - - - -  Follow up Falls evaluation completed Falls evaluation completed - - -    A1c 5.8, kidney functions look good, LDL mildly elevated at 110, liver functions look good, uric acid looks good      Assessment & Plan:  1. Pain in right leg Occasionally gets pain into his legs when he has been standing for long span of time uses Robaxin at home when necessary does not drive with this medicine - methocarbamol (ROBAXIN) 500 MG tablet; Take 1 tablet (500 mg total) by mouth every 8 (eight) hours as needed for muscle spasms.  Dispense: 45 tablet; Refill: 0  2. Benign essential hypertension Blood pressure good control continue his medication  on a regular basis  3. Hyperlipidemia, unspecified hyperlipidemia type Taking statin does not want to be on a higher dose states he will watch his diet closer and recheck labs in the fall possibly change to Crestor if this does not improve enough  4. Aortic atherosclerosis ([facility]) Present on previous test is on a statin is watching diet  5. Depression, major, single episode, complete remission ([place]) Blood pressure medicine doing well PHQ-9 looks good patient wants to continue medication states it is benefiting him  6. Prediabetes A1c reasonable watch diet stay active  7. Chronic gout without tophus, unspecified cause, unspecified site No gout flareups recently continue  allopurinol  8. Primary osteoarthritis of both knees Tylenol as needed Follow-up 6 months wellness at that time

## 2020-06-11 ENCOUNTER — Telehealth: Payer: Self-pay | Admitting: Family Medicine

## 2020-06-11 DIAGNOSIS — Z029 Encounter for administrative examinations, unspecified: Secondary | ICD-10-CM

## 2020-06-11 NOTE — Telephone Encounter (Signed)
Patient brought in FMLA to be completed in your folder,tried to do some of it. Please date and sign.

## 2020-06-13 NOTE — Telephone Encounter (Signed)
Patient states he has been completely out of work due to his legs/knees for over 2 weeks- he thinks the date was 05/30/20- he doesn't know when he will be able to return to work but said if he cant return in 2 more weeks he will get an appt with Dr Aline Brochure

## 2020-06-13 NOTE — Telephone Encounter (Signed)
Nurses-please call [name] [name].  He brought by a FMLA form.  Clarify with him regarding his discomfort in his legs-I can put onto the form on days that the pain is severe he will need to stay at home.  May need to come to the doctor periodically.  I can also put on their time limitations regarding how many hours we would recommend for him working before calling it quits.  For example 4-hour shifts or a 6-hour shift.  We can also put on there that he may need to take 10-minute breaks when the problem flares up during work if he feels he needs it.  Please talk with him regarding these various scenarios see what appeals to him and then send me some feedback and then I can incorporate it into the FMLA thank you

## 2020-06-15 NOTE — Telephone Encounter (Signed)
Form was filled out as asked.  Patient should look it over if I need to add anything else please let me know thank you

## 2020-06-16 NOTE — Telephone Encounter (Signed)
Please notify pt and then can close message

## 2020-06-16 NOTE — Telephone Encounter (Signed)
Forms has been faxed and upfront for pick up

## 2020-07-19 ENCOUNTER — Other Ambulatory Visit: Payer: Self-pay | Admitting: Family Medicine

## 2020-07-21 ENCOUNTER — Other Ambulatory Visit: Payer: Self-pay | Admitting: Family Medicine

## 2020-07-29 ENCOUNTER — Other Ambulatory Visit: Payer: Self-pay | Admitting: Family Medicine

## 2020-08-04 ENCOUNTER — Ambulatory Visit: Payer: Medicare HMO

## 2020-08-04 ENCOUNTER — Ambulatory Visit: Payer: Medicare HMO | Admitting: Orthopedic Surgery

## 2020-08-04 ENCOUNTER — Other Ambulatory Visit: Payer: Self-pay

## 2020-08-04 ENCOUNTER — Encounter: Payer: Self-pay | Admitting: Orthopedic Surgery

## 2020-08-04 VITALS — BP 155/88 | HR 92 | Resp 16 | Ht 68.0 in | Wt 202.0 lb

## 2020-08-04 DIAGNOSIS — M25562 Pain in left knee: Secondary | ICD-10-CM

## 2020-08-04 DIAGNOSIS — G8929 Other chronic pain: Secondary | ICD-10-CM

## 2020-08-04 DIAGNOSIS — M79604 Pain in right leg: Secondary | ICD-10-CM

## 2020-08-04 DIAGNOSIS — Z96651 Presence of right artificial knee joint: Secondary | ICD-10-CM | POA: Diagnosis not present

## 2020-08-04 DIAGNOSIS — M1612 Unilateral primary osteoarthritis, left hip: Secondary | ICD-10-CM

## 2020-08-04 DIAGNOSIS — M25552 Pain in left hip: Secondary | ICD-10-CM

## 2020-08-04 DIAGNOSIS — M25561 Pain in right knee: Secondary | ICD-10-CM

## 2020-08-04 MED ORDER — PREDNISONE 10 MG (48) PO TBPK: ORAL_TABLET | Freq: Every day | ORAL | 0 refills | Status: DC

## 2020-08-04 NOTE — Patient Instructions (Signed)
Stop taking meloxicam for 12 days   Take prednisone for 12 days   Meds ordered this encounter  Medications  . predniSONE (STERAPRED UNI-PAK 48 TAB) 10 MG (48) TBPK tablet    Sig: Take by mouth daily. Ds 12days as directed    Dispense:  48 tablet    Refill:  0

## 2020-08-04 NOTE — Progress Notes (Signed)
Routine office visit  Progress note  Complaint #1 I cannot cross my left leg onto my right leg  Complaint #2 pain running from my right hip to my right knee  Complaint #3 it is hard to get up and stand on my right leg  70 year old male had a right total knee had recent x-rays in February at 2 years with no problems with his implant he also had a right total hip in 2015  He also has lumbar disc disease  His examination is remarkable for decreased rotation of his left hip with a nontender left knee  His right knee is also nontender although he says it feels full and has excellent range of motion  He also notes his symptoms started back in March when he went to a service and stood for a long time there has been no trauma    Images of his left hip Compared to his x-ray in February has progressive arthritis of his left hip with more narrowing more loss of femoral neck offset.  The decreased rotation of his left hip is preventing him from crossing his left leg onto his right leg  The pain running down the right hip to the knee is from his degenerative disc disease  The difficulty standing on the right leg is related to degenerative disc disease and I think he has some synovitis in his knee  Some going to start him on some prednisone for now I will see him in 3 weeks if not better we will try some therapy as well  Encounter Diagnoses  Name Primary?  . Right leg pain Yes  . Status post right knee replacement   . Pain in left hip

## 2020-08-24 ENCOUNTER — Other Ambulatory Visit: Payer: Self-pay | Admitting: Family Medicine

## 2020-08-25 ENCOUNTER — Other Ambulatory Visit: Payer: Self-pay

## 2020-08-25 ENCOUNTER — Encounter: Payer: Self-pay | Admitting: Orthopedic Surgery

## 2020-08-25 ENCOUNTER — Ambulatory Visit: Payer: Medicare HMO | Admitting: Orthopedic Surgery

## 2020-08-25 VITALS — BP 155/92 | HR 98 | Ht 68.0 in | Wt 204.0 lb

## 2020-08-25 DIAGNOSIS — M545 Low back pain, unspecified: Secondary | ICD-10-CM

## 2020-08-25 DIAGNOSIS — M25552 Pain in left hip: Secondary | ICD-10-CM | POA: Diagnosis not present

## 2020-08-25 DIAGNOSIS — G8929 Other chronic pain: Secondary | ICD-10-CM

## 2020-08-25 DIAGNOSIS — Z96651 Presence of right artificial knee joint: Secondary | ICD-10-CM

## 2020-08-25 DIAGNOSIS — Z96641 Presence of right artificial hip joint: Secondary | ICD-10-CM

## 2020-08-25 NOTE — Progress Notes (Signed)
Routine follow-up Chief Complaint  Patient presents with  . Knee Pain    Bilateral/ L knee is pretty good today, R knee is tight and pain in my right buttock, can't seem to get rid of it.   [name] [name] still suffers with pain in his right gluteal area.  He has severe degenerative disc disease lumbar spine  His left hip is still tight  Based on his x-ray lumbar spine clinical symptoms of lower back pain gluteal pain and occasional radiating pain right leg recommend epidural steroid injections  He would need an MRI to get that  Supportive physical findings for MRI evaluation  Back pain with straight leg raise positive on the right altered sensation in the L5 root  He has been treated with muscle relaxers and anti-inflammatories for.  Over 6 weeks

## 2020-08-25 NOTE — Patient Instructions (Signed)
While we are working on your approval for MRI please go ahead and call to schedule your appointment with [facility] within at least one (1) week.   Central Scheduling [phone]  

## 2020-09-03 ENCOUNTER — Ambulatory Visit (HOSPITAL_COMMUNITY)
Admission: RE | Admit: 2020-09-03 | Discharge: 2020-09-03 | Disposition: A | Discharge status: Home / Self Care | Payer: Medicare HMO | Source: Ambulatory Visit | Attending: Orthopedic Surgery | Admitting: Orthopedic Surgery

## 2020-09-03 ENCOUNTER — Other Ambulatory Visit: Payer: Self-pay

## 2020-09-03 DIAGNOSIS — G8929 Other chronic pain: Secondary | ICD-10-CM | POA: Diagnosis not present

## 2020-09-03 DIAGNOSIS — M545 Low back pain, unspecified: Secondary | ICD-10-CM | POA: Insufficient documentation

## 2020-09-04 ENCOUNTER — Ambulatory Visit (INDEPENDENT_AMBULATORY_CARE_PROVIDER_SITE_OTHER): Payer: Medicare HMO | Admitting: Orthopedic Surgery

## 2020-09-04 ENCOUNTER — Encounter: Payer: Self-pay | Admitting: Orthopedic Surgery

## 2020-09-04 VITALS — BP 153/83 | Temp 103.0°F | Ht 68.0 in | Wt 204.0 lb

## 2020-09-04 DIAGNOSIS — M5441 Lumbago with sciatica, right side: Secondary | ICD-10-CM

## 2020-09-04 DIAGNOSIS — G8929 Other chronic pain: Secondary | ICD-10-CM

## 2020-09-04 NOTE — Progress Notes (Addendum)
Follow-up visit  Encounter Diagnosis  Name Primary?   Chronic right-sided low back pain, unspecified whether sciatica present Yes    70 year old male with right leg pain sent for MRIs here for his follow-up to discuss the results  Images have been reviewed I interpret those as L45 spinal stenosis with right L5 nerve root impingement  Results reviewed with [name]   Copy of impression included IMPRESSION: 1. At L4-L5, moderate canal stenosis with severe right subarticular recess stenosis and suspected impingement of the descending right L5 nerve roots. Moderate right greater than left foraminal stenosis at this level. 2. Mild canal stenosis at L3-L4 and multilevel mild foraminal stenosis as detailed above.     Electronically Signed   By: Margaretha Sheffield MD   On: 09/03/2020 13:45    Recommend patient be referred for esi

## 2020-09-04 NOTE — Addendum Note (Signed)
Addended byCandice Camp on: 09/04/2020 10:19 AM   Modules accepted: Orders

## 2020-09-04 NOTE — Patient Instructions (Signed)
Call after 2nd epidural

## 2020-09-08 IMAGING — US US CAROTID DUPLEX BILAT
1 series · 13 of 24 positions shown · non-contrast
Comparison: None.

CLINICAL DATA: Left carotid bruit

EXAM:
BILATERAL CAROTID DUPLEX ULTRASOUND
TECHNIQUE: Gray scale imaging, color Doppler and duplex ultrasound were
performed of bilateral carotid and vertebral arteries in the neck.

[Series 1: us carotid duplex bilat · 13 of 69 slices shown]
[im 1/69]
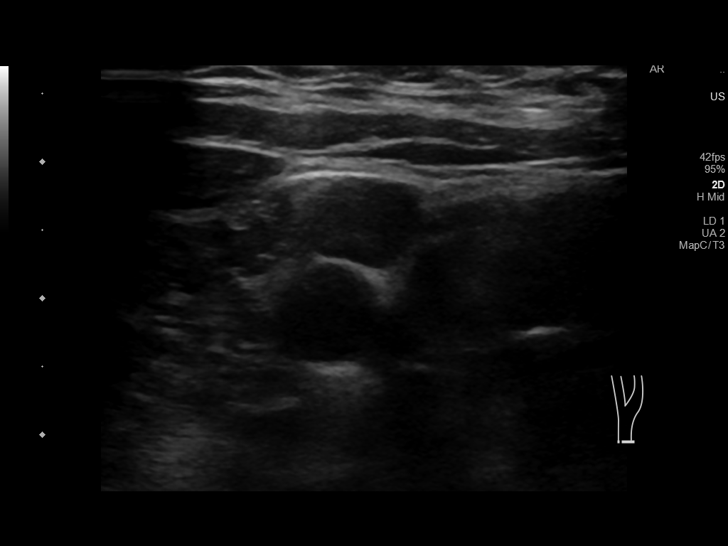
[im 6/69]
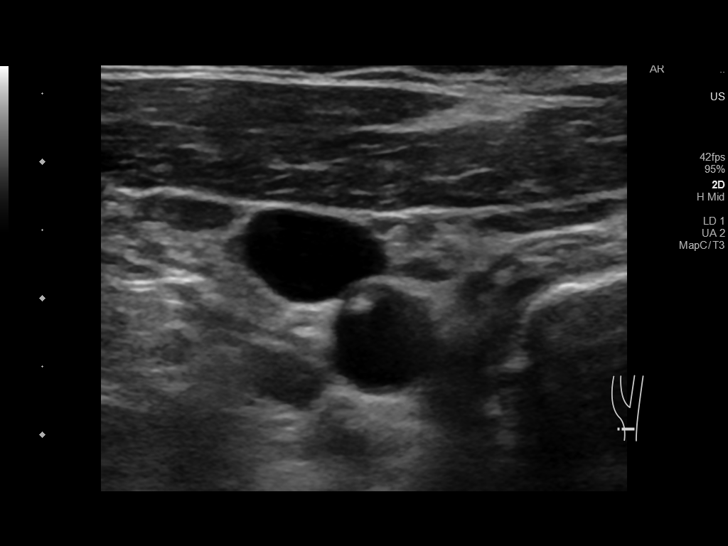
[im 12/69]
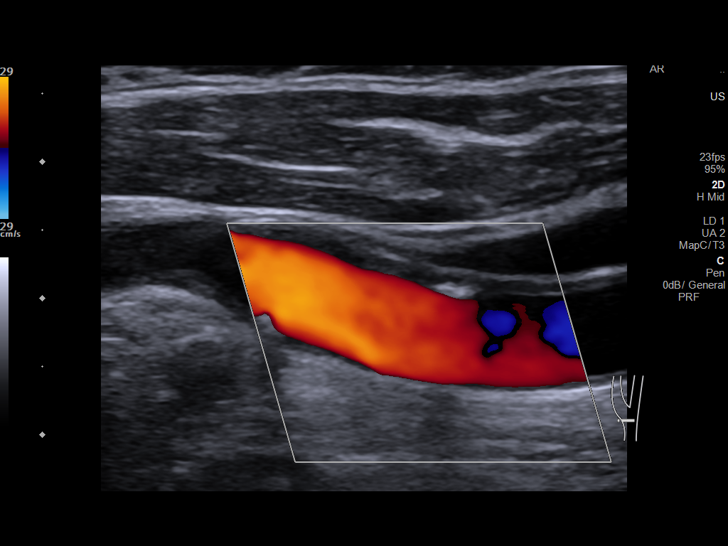
[im 18/69]
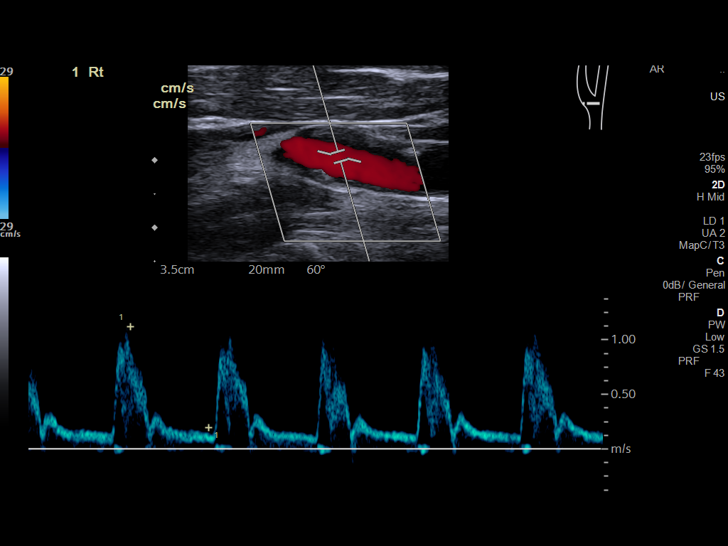
[im 24/69]
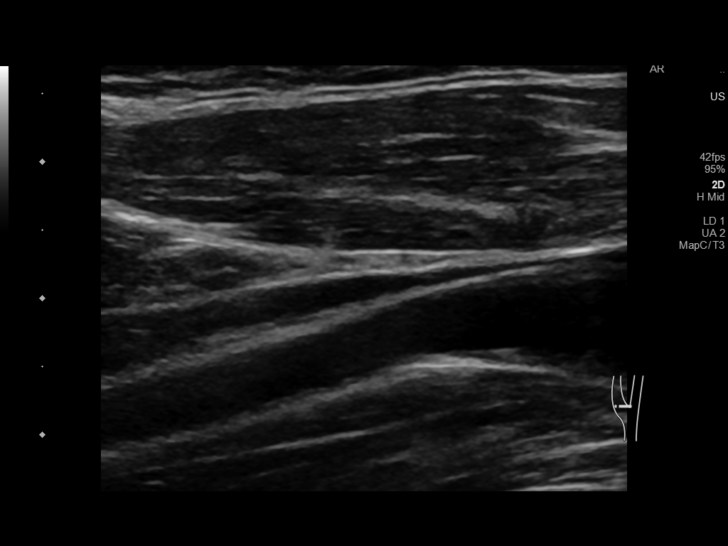
[im 30/69]
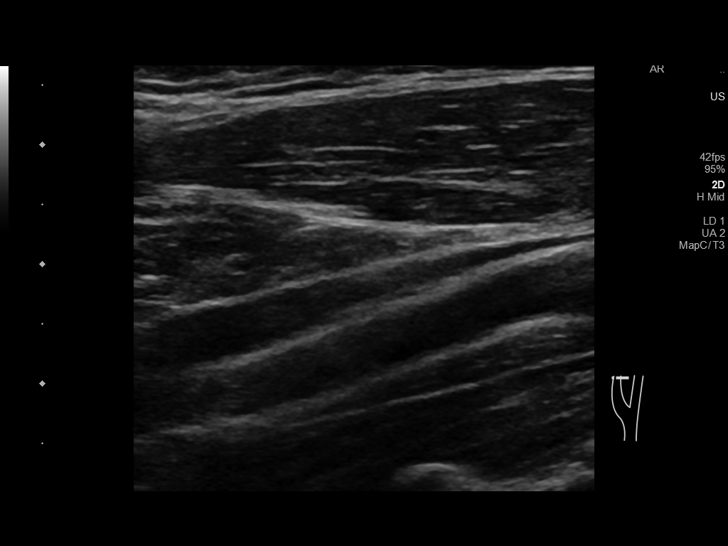
[im 36/69]
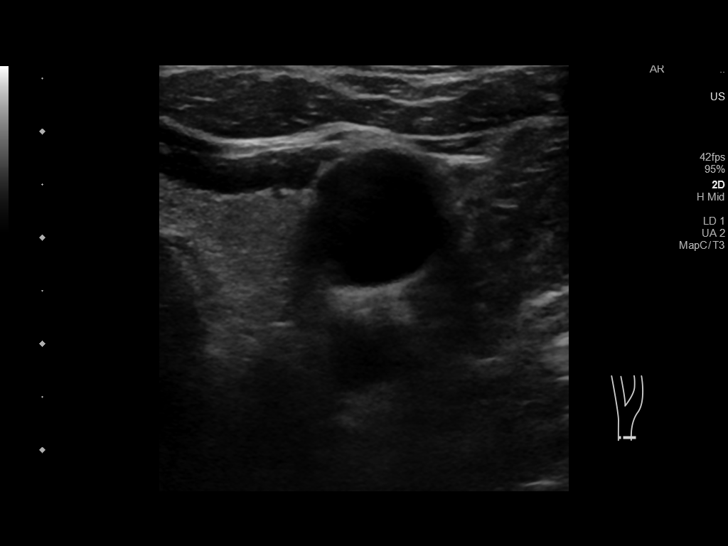
[im 39/69]
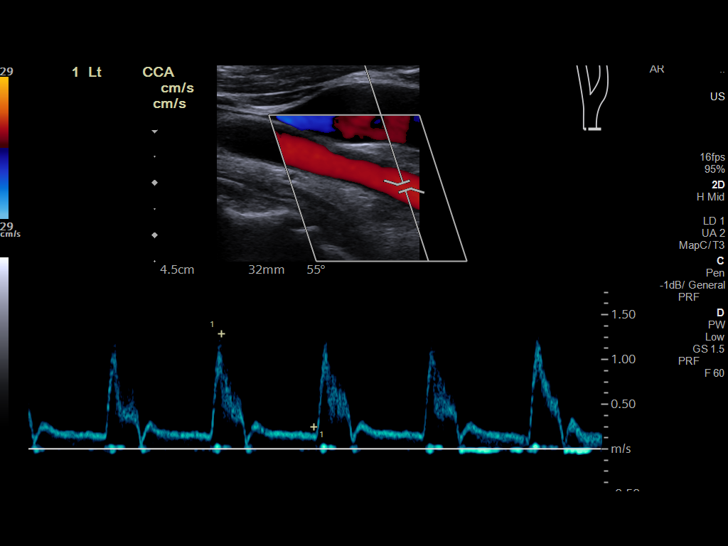
[im 45/69]
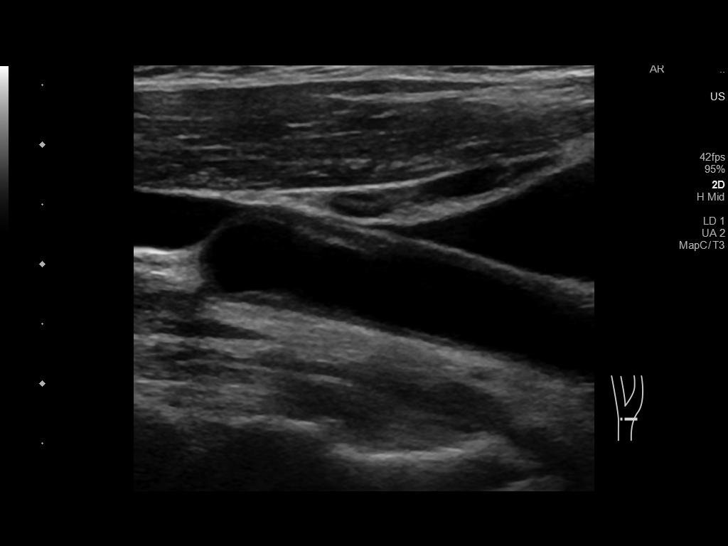
[im 51/69]
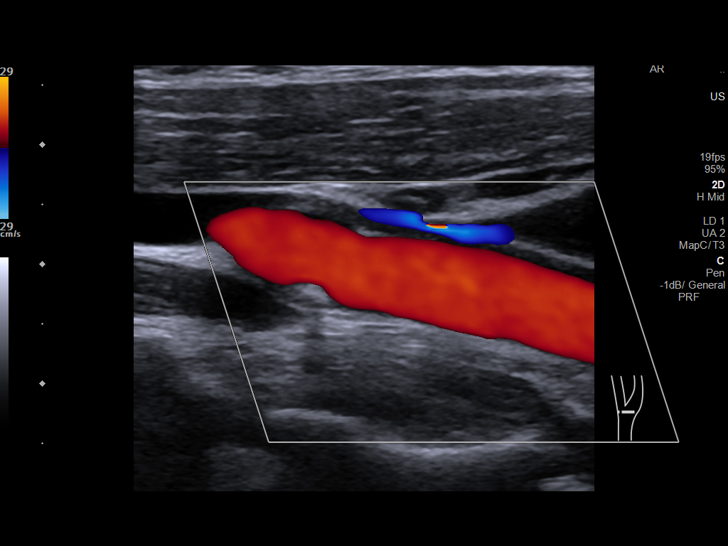
[im 57/69]
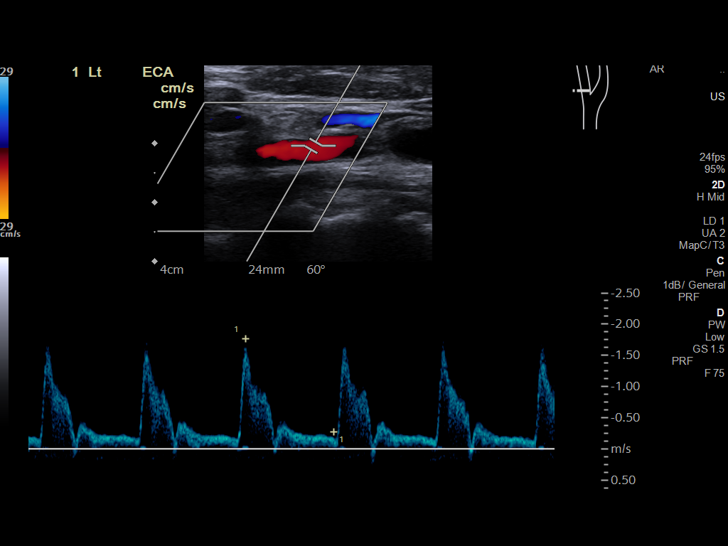
[im 63/69]
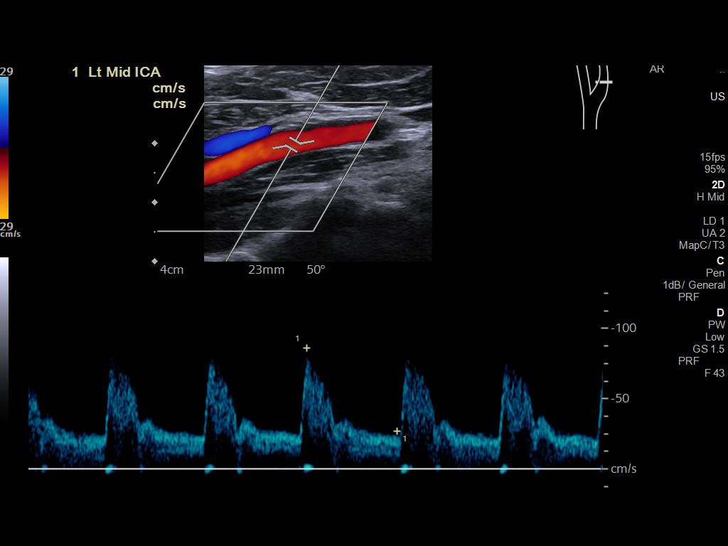
[im 69/69]
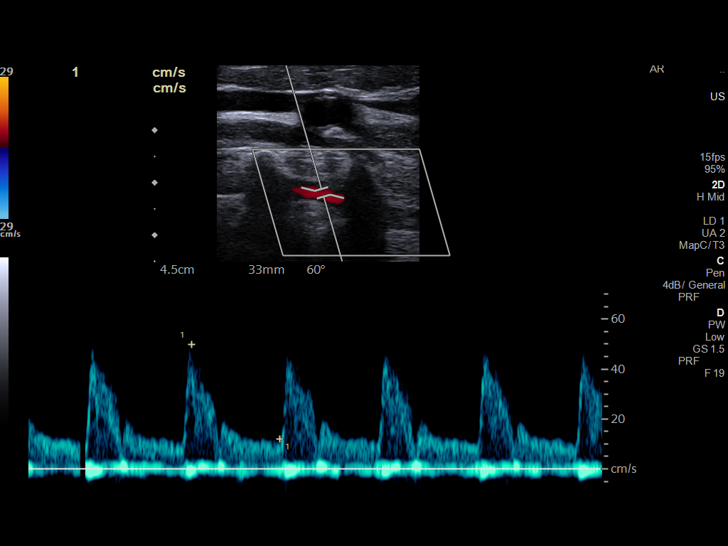

[13 of 24 positions shown; findings below may reference images not displayed]

FINDINGS: Criteria: Quantification of carotid stenosis is based on velocity
parameters that correlate the residual internal carotid diameter
with NASCET-based stenosis levels, using the diameter of the distal
internal carotid lumen as the denominator for stenosis measurement.

The following velocity measurements were obtained:

RIGHT

ICA: 101/26 cm/sec

CCA: 105/16 cm/sec

SYSTOLIC ICA/CCA RATIO:

ECA: 189 cm/sec

LEFT

ICA: 97/31 cm/sec

CCA: 115/21 cm/sec

SYSTOLIC ICA/CCA RATIO:

ECA: 177 cm/sec

RIGHT CAROTID ARTERY: Minor echogenic shadowing plaque formation. No
hemodynamically significant right ICA stenosis, velocity elevation,
or turbulent flow. Degree of narrowing less than 50%.

RIGHT VERTEBRAL ARTERY:  Antegrade

LEFT CAROTID ARTERY: Similar scattered minor echogenic plaque
formation. No hemodynamically significant left ICA stenosis,
velocity elevation, or turbulent flow.

LEFT VERTEBRAL ARTERY:  Antegrade
IMPRESSION: Minor carotid atherosclerosis. No hemodynamically significant ICA
stenosis. Degree of narrowing less than 50% bilaterally by
ultrasound criteria.

Patent antegrade vertebral flow bilaterally

## 2020-09-11 ENCOUNTER — Telehealth: Payer: Self-pay

## 2020-09-11 NOTE — Telephone Encounter (Signed)
Called patient back and confirmed that he does not take any anticoagulants. He does take 81 mg Aspirin per day. I advised that he can continue all of his medications including aspirin.

## 2020-09-11 NOTE — Telephone Encounter (Signed)
Patient called he wants to know when he should stop taking medications prior to his visit call back:[phone]

## 2020-09-17 ENCOUNTER — Ambulatory Visit: Payer: Self-pay

## 2020-09-17 ENCOUNTER — Other Ambulatory Visit: Payer: Self-pay

## 2020-09-17 ENCOUNTER — Ambulatory Visit (INDEPENDENT_AMBULATORY_CARE_PROVIDER_SITE_OTHER): Payer: Medicare HMO | Admitting: Physical Medicine and Rehabilitation

## 2020-09-17 ENCOUNTER — Encounter: Payer: Self-pay | Admitting: Physical Medicine and Rehabilitation

## 2020-09-17 VITALS — BP 153/76 | HR 90

## 2020-09-17 DIAGNOSIS — M5416 Radiculopathy, lumbar region: Secondary | ICD-10-CM | POA: Diagnosis not present

## 2020-09-17 DIAGNOSIS — M48061 Spinal stenosis, lumbar region without neurogenic claudication: Secondary | ICD-10-CM

## 2020-09-17 MED ORDER — BETAMETHASONE SOD PHOS & ACET 6 (3-3) MG/ML IJ SUSP
12.0000 mg | Freq: Once | INTRAMUSCULAR | Status: AC
  Administered 2020-09-17: 12 mg

## 2020-09-17 NOTE — Progress Notes (Signed)
[name] [name] - 70 y.o. male MRN [id number]  Date of birth: 30-Apr-1950  Office Visit Note: Visit Date: 09/17/2020 PCP: Kathyrn Drown, MD Referred by: Kathyrn Drown, MD  Subjective: Chief Complaint  Patient presents with   Lower Back - Pain   Right Hip - Pain   Right Knee - Pain   HPI:  [name] [name] is a 70 y.o. male who comes in today at the request of Dr. [name] for planned Right L5-S1 Lumbar Interlaminar epidural steroid injection with fluoroscopic guidance.  The patient has failed conservative care including home exercise, medications, time and activity modification.  This injection will be diagnostic and hopefully therapeutic.  Please see requesting physician notes for further details and justification. MRI reviewed with images and spine model.  MRI reviewed in the note below.  We did have a long discussion today about lumbar stenosis and surgical considerations versus injection considerations.  Also of note the request was put in as a series of 3.  I did acknowledge to the patient that we really do not do a series of injections although it turns out to be a series sometimes if they are getting better after each injection than that something that kind happens on its own.  This series of 3 that people order has really fallen out of favor in a sort of an old concept.  I did talk to the patient about following up with [place] depending on the relief with his injection and considering second injection or maybe even transforaminal approach.  Hopefully he will do okay with interventional treatment.     ROS Otherwise per HPI.  Assessment & Plan: Visit Diagnoses:    ICD-10-CM   1. Lumbar radiculopathy  M54.16 XR C-ARM NO REPORT    Epidural Steroid injection    betamethasone acetate-betamethasone sodium phosphate (CELESTONE) injection 12 mg    2. Stenosis of lateral recess of lumbar spine  M48.061 XR C-ARM NO REPORT    Epidural Steroid injection    betamethasone  acetate-betamethasone sodium phosphate (CELESTONE) injection 12 mg      Plan: No additional findings.   Meds & Orders:  Meds ordered this encounter  Medications   betamethasone acetate-betamethasone sodium phosphate (CELESTONE) injection 12 mg    Orders Placed This Encounter  Procedures   XR C-ARM NO REPORT   Epidural Steroid injection    Follow-up: No follow-ups on file.   Procedures: No procedures performed  Lumbar Epidural Steroid Injection - Interlaminar Approach with Fluoroscopic Guidance  Patient: [name] [name]      Date of Birth: Aug 17, 1950 MRN: [id number] PCP: Kathyrn Drown, MD      Visit Date: 09/17/2020   Universal Protocol:     Consent Given By: the patient  Position: PRONE  Additional Comments: Vital signs were monitored before and after the procedure. Patient was prepped and draped in the usual sterile fashion. The correct patient, procedure, and site was verified.   Injection Procedure Details:   Procedure diagnoses: Lumbar radiculopathy [M54.16]   Meds Administered:  Meds ordered this encounter  Medications   betamethasone acetate-betamethasone sodium phosphate (CELESTONE) injection 12 mg     Laterality: Right  Location/Site:  L5-S1  Needle: 3.5 in., 20 ga. Tuohy  Needle Placement: Paramedian epidural  Findings:   -Comments: Excellent flow of contrast into the epidural space.  Procedure Details: Using a paramedian approach from the side mentioned above, the region overlying the inferior lamina was localized under fluoroscopic visualization and the  soft tissues overlying this structure were infiltrated with 4 ml. of 1% Lidocaine without Epinephrine. The Tuohy needle was inserted into the epidural space using a paramedian approach.   The epidural space was localized using loss of resistance along with counter oblique bi-planar fluoroscopic views.  After negative aspirate for air, blood, and CSF, a 2 ml. volume of Isovue-250 was injected  into the epidural space and the flow of contrast was observed. Radiographs were obtained for documentation purposes.    The injectate was administered into the level noted above.   Additional Comments:  The patient tolerated the procedure well Dressing: 2 x 2 sterile gauze and Band-Aid    Post-procedure details: Patient was observed during the procedure. Post-procedure instructions were reviewed.  Patient left the clinic in stable condition.   Clinical History: MRI LUMBAR SPINE WITHOUT CONTRAST   TECHNIQUE: Multiplanar, multisequence MR imaging of the lumbar spine was performed. No intravenous contrast was administered.   COMPARISON:  Lumbar radiographs 02/25/2020 without report.   FINDINGS: Segmentation: 5 non rib-bearing lumbar vertebral bodies with small ribs at T12 on prior lumbar radiographs.   Alignment: Mild levocurvature of the lower lumbar spine. Slight retrolisthesis of L2 on L3.   Vertebrae: Vertebral body heights are maintained. No evidence of acute fracture, discitis/osteomyelitis, or suspicious bone lesion. Benign L1 vertebral venous malformation. Bulky anterior bridging osteophytes multiple levels.   Conus medullaris and cauda equina: Conus extends to the L2 level. Conus appears normal.   Paraspinal and other soft tissues: Unremarkable   Disc levels:   T12-L1: No significant disc protrusion, foraminal stenosis, or canal stenosis.   L1-L2: Mild disc bulging and bilateral facet hypertrophy without significant canal or foraminal stenosis.   L2-L3: Slight retrolisthesis of L2 on L3. Broad disc bulge and mild bilateral facet hypertrophy with ligamentum flavum thickening. Mild bilateral foraminal stenosis. No significant canal stenosis.   L3-L4: Broad disc bulge and moderate bilateral facet hypertrophy and ligamentum flavum thickening. Resulting mild bilateral foraminal stenosis. Mild canal stenosis.   L4-L5: Broad disc bulge with superimposed  right subarticular/foraminal disc protrusion. Severe bilateral facet hypertrophy. Resulting moderate canal stenosis with severe right subarticular recess stenosis and suspected impingement of the descending right L5 nerve roots. Moderate right greater than left foraminal stenosis.   L5-S1: Small broad disc bulge and moderate to severe bilateral facet hypertrophy. Resulting mild bilateral foraminal stenosis. There is tapering of the canal at this level with prominent epidural fat.   IMPRESSION: 1. At L4-L5, moderate canal stenosis with severe right subarticular recess stenosis and suspected impingement of the descending right L5 nerve roots. Moderate right greater than left foraminal stenosis at this level. 2. Mild canal stenosis at L3-L4 and multilevel mild foraminal stenosis as detailed above.     Electronically Signed   By: Margaretha Sheffield MD   On: 09/03/2020 13:45     Objective:  VS:  HT:    WT:   BMI:     BP:(!) 153/76  HR:90bpm  TEMP: ( )  RESP:  Physical Exam Vitals and nursing note reviewed.  Constitutional:      General: He is not in acute distress.    Appearance: Normal appearance. He is not ill-appearing.  HENT:     Head: Normocephalic and atraumatic.     Right Ear: External ear normal.     Left Ear: External ear normal.     Nose: No congestion.  Eyes:     Extraocular Movements: Extraocular movements intact.  Cardiovascular:  Rate and Rhythm: Normal rate.     Pulses: Normal pulses.  Pulmonary:     Effort: Pulmonary effort is normal. No respiratory distress.  Abdominal:     General: There is no distension.     Palpations: Abdomen is soft.  Musculoskeletal:        General: No tenderness or signs of injury.     Cervical back: Neck supple.     Right lower leg: No edema.     Left lower leg: No edema.     Comments: Patient has good distal strength without clonus.  Skin:    Findings: No erythema or rash.  Neurological:     General: No focal  deficit present.     Mental Status: He is alert and oriented to person, place, and time.     Sensory: No sensory deficit.     Motor: No weakness or abnormal muscle tone.     Coordination: Coordination normal.  Psychiatric:        Mood and Affect: Mood normal.        Behavior: Behavior normal.     Imaging: No results found.

## 2020-09-17 NOTE — Progress Notes (Signed)
Pt state lower back pain that travels to his right hip to his buttocks. Pt state that pain goes down to his right knee. Pt state walking and doing yard work makes the pain worse. Pt state he sits to rest to help ease his pain.  Numeric Pain Rating Scale and Functional Assessment Average Pain 4   In the last MONTH (on 0-10 scale) has pain interfered with the following?  1. General activity like being  able to carry out your everyday physical activities such as walking, climbing stairs, carrying groceries, or moving a chair?  Rating(10)   +Driver, -BT, -Dye Allergies.

## 2020-09-17 NOTE — Patient Instructions (Signed)

## 2020-09-17 NOTE — Procedures (Signed)
Lumbar Epidural Steroid Injection - Interlaminar Approach with Fluoroscopic Guidance  Patient: [name] [name]      Date of Birth: [birth year] MRN: [id number] PCP: Kathyrn Drown, MD      Visit Date: 09/17/2020   Universal Protocol:     Consent Given By: the patient  Position: PRONE  Additional Comments: Vital signs were monitored before and after the procedure. Patient was prepped and draped in the usual sterile fashion. The correct patient, procedure, and site was verified.   Injection Procedure Details:   Procedure diagnoses: Lumbar radiculopathy [M54.16]   Meds Administered:  Meds ordered this encounter  Medications   betamethasone acetate-betamethasone sodium phosphate (CELESTONE) injection 12 mg     Laterality: Right  Location/Site:  L5-S1  Needle: 3.5 in., 20 ga. Tuohy  Needle Placement: Paramedian epidural  Findings:   -Comments: Excellent flow of contrast into the epidural space.  Procedure Details: Using a paramedian approach from the side mentioned above, the region overlying the inferior lamina was localized under fluoroscopic visualization and the soft tissues overlying this structure were infiltrated with 4 ml. of 1% Lidocaine without Epinephrine. The Tuohy needle was inserted into the epidural space using a paramedian approach.   The epidural space was localized using loss of resistance along with counter oblique bi-planar fluoroscopic views.  After negative aspirate for air, blood, and CSF, a 2 ml. volume of Isovue-250 was injected into the epidural space and the flow of contrast was observed. Radiographs were obtained for documentation purposes.    The injectate was administered into the level noted above.   Additional Comments:  The patient tolerated the procedure well Dressing: 2 x 2 sterile gauze and Band-Aid    Post-procedure details: Patient was observed during the procedure. Post-procedure instructions were reviewed.  Patient left the  clinic in stable condition.

## 2020-09-23 ENCOUNTER — Telehealth: Payer: Self-pay | Admitting: Family Medicine

## 2020-09-23 DIAGNOSIS — M1A9XX0 Chronic gout, unspecified, without tophus (tophi): Secondary | ICD-10-CM

## 2020-09-23 DIAGNOSIS — R7303 Prediabetes: Secondary | ICD-10-CM

## 2020-09-23 DIAGNOSIS — I10 Essential (primary) hypertension: Secondary | ICD-10-CM

## 2020-09-23 DIAGNOSIS — E785 Hyperlipidemia, unspecified: Secondary | ICD-10-CM

## 2020-09-23 DIAGNOSIS — Z79899 Other long term (current) drug therapy: Secondary | ICD-10-CM

## 2020-09-23 NOTE — Telephone Encounter (Signed)
Patient is needing labs for 6 month follow up in September.

## 2020-09-23 NOTE — Telephone Encounter (Signed)
Blood work ordered in Epic. Patient notified. 

## 2020-09-23 NOTE — Telephone Encounter (Signed)
A1c, lipid, liver, metabolic 7, uric acid  History of gout, prediabetes, hyperlipidemia, high risk med

## 2020-09-29 ENCOUNTER — Other Ambulatory Visit: Payer: Self-pay | Admitting: Orthopedic Surgery

## 2020-09-29 DIAGNOSIS — M79604 Pain in right leg: Secondary | ICD-10-CM

## 2020-10-01 ENCOUNTER — Telehealth: Payer: Self-pay | Admitting: Physical Medicine and Rehabilitation

## 2020-10-01 NOTE — Telephone Encounter (Signed)
Patient reports that he is "not more then 33%" better following his right L5-S1 IL on 09/17/2020. Please advise.

## 2020-10-01 NOTE — Telephone Encounter (Signed)
Needs auth and scheduling for right L5 TF.

## 2020-10-01 NOTE — Telephone Encounter (Signed)
Pt calling stating he had his inj 09/17/20 and was told to call back 2 weeks post and speak with the nurse. The best call back number is [phone].

## 2020-10-02 ENCOUNTER — Other Ambulatory Visit: Payer: Self-pay | Admitting: Family Medicine

## 2020-10-03 ENCOUNTER — Telehealth: Payer: Self-pay | Admitting: Physical Medicine and Rehabilitation

## 2020-10-03 NOTE — Telephone Encounter (Signed)
Pt called requesting a call back from [facility] about an appt. Please call pt phone number at [phone].

## 2020-10-16 ENCOUNTER — Ambulatory Visit (INDEPENDENT_AMBULATORY_CARE_PROVIDER_SITE_OTHER): Payer: Medicare HMO | Admitting: Physical Medicine and Rehabilitation

## 2020-10-16 ENCOUNTER — Ambulatory Visit: Payer: Self-pay

## 2020-10-16 ENCOUNTER — Telehealth: Payer: Self-pay | Admitting: Orthopedic Surgery

## 2020-10-16 ENCOUNTER — Other Ambulatory Visit: Payer: Self-pay

## 2020-10-16 ENCOUNTER — Encounter: Payer: Self-pay | Admitting: Physical Medicine and Rehabilitation

## 2020-10-16 VITALS — BP 136/73 | HR 84

## 2020-10-16 DIAGNOSIS — M48061 Spinal stenosis, lumbar region without neurogenic claudication: Secondary | ICD-10-CM

## 2020-10-16 DIAGNOSIS — M5416 Radiculopathy, lumbar region: Secondary | ICD-10-CM

## 2020-10-16 MED ORDER — METHYLPREDNISOLONE ACETATE 80 MG/ML IJ SUSP: 80.0000 mg | Freq: Once | INTRAMUSCULAR | Status: DC

## 2020-10-16 NOTE — Progress Notes (Signed)
Pt state lower back pain that travels to right hip. Pt state bending and putting weight on the hip makes the pain worse. Pt state he sit and rest to help ease pain. Pt has hx of inj on 09/17/20 pt state it helped for few days with 30% relief.  Numeric Pain Rating Scale and Functional Assessment Average Pain 7   In the last MONTH (on 0-10 scale) has pain interfered with the following?  1. General activity like being  able to carry out your everyday physical activities such as walking, climbing stairs, carrying groceries, or moving a chair?  Rating(9)   +Driver, -BT, -Dye Allergies.

## 2020-10-16 NOTE — Patient Instructions (Signed)

## 2020-10-16 NOTE — Progress Notes (Signed)
[name] [name] - 70 y.o. male MRN [id number]  Date of birth: [birth year]  Office Visit Note: Visit Date: 10/16/2020 PCP: Kathyrn Drown, MD Referred by: Kathyrn Drown, MD  Subjective: Chief Complaint  Patient presents with   Lower Back - Pain   Right Hip - Pain   HPI:  [name] [name] is a 70 y.o. male who comes in today for planned repeat Right L5-S1  Lumbar Interlaminar epidural steroid injection with fluoroscopic guidance.  The patient has failed conservative care including home exercise, medications, time and activity modification.  This injection will be diagnostic and hopefully therapeutic.  Please see requesting physician notes for further details and justification. Patient received more than 50% pain relief from prior injection.  Patient reports initial 50% relief and then sustained about 35 to 40% relief.  We will repeat the injection.  Point of fact for the record the last injection was dictated as a transforaminal injection but was indeed an interlaminar injection.  I would try a transforaminal injection potentially depending on the relief he gets with this injection.  Referring: Dr. [name]  ROS Otherwise per HPI.  Assessment & Plan: Visit Diagnoses:    ICD-10-CM   1. Lumbar radiculopathy  M54.16 XR C-ARM NO REPORT    Epidural Steroid injection    methylPREDNISolone acetate (DEPO-MEDROL) injection 80 mg    2. Stenosis of lateral recess of lumbar spine  M48.061 XR C-ARM NO REPORT    Epidural Steroid injection    methylPREDNISolone acetate (DEPO-MEDROL) injection 80 mg      Plan: No additional findings.   Meds & Orders:  Meds ordered this encounter  Medications   methylPREDNISolone acetate (DEPO-MEDROL) injection 80 mg    Orders Placed This Encounter  Procedures   XR C-ARM NO REPORT   Epidural Steroid injection    Follow-up: Return if symptoms worsen or fail to improve.   Procedures: No procedures performed  Lumbar Epidural Steroid Injection -  Interlaminar Approach with Fluoroscopic Guidance  Patient: [name] [name]      Date of Birth: 09-Apr-1950 MRN: [id number] PCP: Kathyrn Drown, MD      Visit Date: 10/16/2020   Universal Protocol:     Consent Given By: the patient  Position: PRONE  Additional Comments: Vital signs were monitored before and after the procedure. Patient was prepped and draped in the usual sterile fashion. The correct patient, procedure, and site was verified.   Injection Procedure Details:   Procedure diagnoses: Lumbar radiculopathy [M54.16]   Meds Administered:  Meds ordered this encounter  Medications   methylPREDNISolone acetate (DEPO-MEDROL) injection 80 mg     Laterality: Right  Location/Site:  L5-S1  Needle: 3.5 in., 20 ga. Tuohy  Needle Placement: Paramedian epidural  Findings:   -Comments: Excellent flow of contrast into the epidural space.  Procedure Details: Using a paramedian approach from the side mentioned above, the region overlying the inferior lamina was localized under fluoroscopic visualization and the soft tissues overlying this structure were infiltrated with 4 ml. of 1% Lidocaine without Epinephrine. The Tuohy needle was inserted into the epidural space using a paramedian approach.   The epidural space was localized using loss of resistance along with counter oblique bi-planar fluoroscopic views.  After negative aspirate for air, blood, and CSF, a 2 ml. volume of Isovue-250 was injected into the epidural space and the flow of contrast was observed. Radiographs were obtained for documentation purposes.    The injectate was administered into the level  noted above.   Additional Comments:  The patient tolerated the procedure well Dressing: 2 x 2 sterile gauze and Band-Aid    Post-procedure details: Patient was observed during the procedure. Post-procedure instructions were reviewed.  Patient left the clinic in stable condition.   Clinical History: MRI LUMBAR  SPINE WITHOUT CONTRAST   TECHNIQUE: Multiplanar, multisequence MR imaging of the lumbar spine was performed. No intravenous contrast was administered.   COMPARISON:  Lumbar radiographs 02/25/2020 without report.   FINDINGS: Segmentation: 5 non rib-bearing lumbar vertebral bodies with small ribs at T12 on prior lumbar radiographs.   Alignment: Mild levocurvature of the lower lumbar spine. Slight retrolisthesis of L2 on L3.   Vertebrae: Vertebral body heights are maintained. No evidence of acute fracture, discitis/osteomyelitis, or suspicious bone lesion. Benign L1 vertebral venous malformation. Bulky anterior bridging osteophytes multiple levels.   Conus medullaris and cauda equina: Conus extends to the L2 level. Conus appears normal.   Paraspinal and other soft tissues: Unremarkable   Disc levels:   T12-L1: No significant disc protrusion, foraminal stenosis, or canal stenosis.   L1-L2: Mild disc bulging and bilateral facet hypertrophy without significant canal or foraminal stenosis.   L2-L3: Slight retrolisthesis of L2 on L3. Broad disc bulge and mild bilateral facet hypertrophy with ligamentum flavum thickening. Mild bilateral foraminal stenosis. No significant canal stenosis.   L3-L4: Broad disc bulge and moderate bilateral facet hypertrophy and ligamentum flavum thickening. Resulting mild bilateral foraminal stenosis. Mild canal stenosis.   L4-L5: Broad disc bulge with superimposed right subarticular/foraminal disc protrusion. Severe bilateral facet hypertrophy. Resulting moderate canal stenosis with severe right subarticular recess stenosis and suspected impingement of the descending right L5 nerve roots. Moderate right greater than left foraminal stenosis.   L5-S1: Small broad disc bulge and moderate to severe bilateral facet hypertrophy. Resulting mild bilateral foraminal stenosis. There is tapering of the canal at this level with prominent epidural fat.    IMPRESSION: 1. At L4-L5, moderate canal stenosis with severe right subarticular recess stenosis and suspected impingement of the descending right L5 nerve roots. Moderate right greater than left foraminal stenosis at this level. 2. Mild canal stenosis at L3-L4 and multilevel mild foraminal stenosis as detailed above.     Electronically Signed   By: Margaretha Sheffield MD   On: 09/03/2020 13:45     Objective:  VS:  HT:    WT:   BMI:     BP:136/73  HR:84bpm  TEMP: ( )  RESP:  Physical Exam Vitals and nursing note reviewed.  Constitutional:      General: He is not in acute distress.    Appearance: Normal appearance. He is not ill-appearing.  HENT:     Head: Normocephalic and atraumatic.     Right Ear: External ear normal.     Left Ear: External ear normal.     Nose: No congestion.  Eyes:     Extraocular Movements: Extraocular movements intact.  Cardiovascular:     Rate and Rhythm: Normal rate.     Pulses: Normal pulses.  Pulmonary:     Effort: Pulmonary effort is normal. No respiratory distress.  Abdominal:     General: There is no distension.     Palpations: Abdomen is soft.  Musculoskeletal:        General: No tenderness or signs of injury.     Cervical back: Neck supple.     Right lower leg: No edema.     Left lower leg: No edema.  Comments: Patient has good distal strength without clonus.  Skin:    Findings: No erythema or rash.  Neurological:     General: No focal deficit present.     Mental Status: He is alert and oriented to person, place, and time.     Sensory: No sensory deficit.     Motor: No weakness or abnormal muscle tone.     Coordination: Coordination normal.  Psychiatric:        Mood and Affect: Mood normal.        Behavior: Behavior normal.     Imaging: No results found.

## 2020-10-16 NOTE — Telephone Encounter (Signed)
Patient called and stated he got his 2nd epidural.  He said that Dr. Aline Brochure told him to schedule an appointment after that 2nd injection.  He wants to know when he should be scheduled.  Please advise  Thanks

## 2020-10-16 NOTE — Telephone Encounter (Signed)
Next available please.

## 2020-10-21 NOTE — Procedures (Signed)
Lumbar Epidural Steroid Injection - Interlaminar Approach with Fluoroscopic Guidance  Patient: [name] [name]      Date of Birth: 20-Jun-1950 MRN: [id number] PCP: Kathyrn Drown, MD      Visit Date: 10/16/2020   Universal Protocol:     Consent Given By: the patient  Position: PRONE  Additional Comments: Vital signs were monitored before and after the procedure. Patient was prepped and draped in the usual sterile fashion. The correct patient, procedure, and site was verified.   Injection Procedure Details:   Procedure diagnoses: Lumbar radiculopathy [M54.16]   Meds Administered:  Meds ordered this encounter  Medications   methylPREDNISolone acetate (DEPO-MEDROL) injection 80 mg     Laterality: Right  Location/Site:  L5-S1  Needle: 3.5 in., 20 ga. Tuohy  Needle Placement: Paramedian epidural  Findings:   -Comments: Excellent flow of contrast into the epidural space.  Procedure Details: Using a paramedian approach from the side mentioned above, the region overlying the inferior lamina was localized under fluoroscopic visualization and the soft tissues overlying this structure were infiltrated with 4 ml. of 1% Lidocaine without Epinephrine. The Tuohy needle was inserted into the epidural space using a paramedian approach.   The epidural space was localized using loss of resistance along with counter oblique bi-planar fluoroscopic views.  After negative aspirate for air, blood, and CSF, a 2 ml. volume of Isovue-250 was injected into the epidural space and the flow of contrast was observed. Radiographs were obtained for documentation purposes.    The injectate was administered into the level noted above.   Additional Comments:  The patient tolerated the procedure well Dressing: 2 x 2 sterile gauze and Band-Aid    Post-procedure details: Patient was observed during the procedure. Post-procedure instructions were reviewed.  Patient left the clinic in stable  condition.

## 2020-10-30 ENCOUNTER — Other Ambulatory Visit: Payer: Medicare HMO

## 2020-10-30 ENCOUNTER — Other Ambulatory Visit: Payer: Self-pay

## 2020-10-30 DIAGNOSIS — Z8546 Personal history of malignant neoplasm of prostate: Secondary | ICD-10-CM

## 2020-10-30 DIAGNOSIS — E291 Testicular hypofunction: Secondary | ICD-10-CM | POA: Diagnosis not present

## 2020-10-31 LAB — TESTOSTERONE: Testosterone: 285 ng/dL (ref 264–916)

## 2020-10-31 LAB — HEMOGLOBIN AND HEMATOCRIT, BLOOD
Hematocrit: 42.1 % (ref 37.5–51.0)
Hemoglobin: 14.5 g/dL (ref 13.0–17.7)

## 2020-10-31 LAB — PSA: Prostate Specific Ag, Serum: <0.1 ng/mL (ref 0.0–4.0)

## 2020-11-06 ENCOUNTER — Other Ambulatory Visit: Payer: Self-pay

## 2020-11-06 ENCOUNTER — Ambulatory Visit (INDEPENDENT_AMBULATORY_CARE_PROVIDER_SITE_OTHER): Payer: Medicare HMO | Admitting: Urology

## 2020-11-06 VITALS — BP 148/69 | HR 96

## 2020-11-06 DIAGNOSIS — N5201 Erectile dysfunction due to arterial insufficiency: Secondary | ICD-10-CM

## 2020-11-06 DIAGNOSIS — Z8546 Personal history of malignant neoplasm of prostate: Secondary | ICD-10-CM

## 2020-11-06 DIAGNOSIS — N3941 Urge incontinence: Secondary | ICD-10-CM | POA: Diagnosis not present

## 2020-11-06 DIAGNOSIS — E291 Testicular hypofunction: Secondary | ICD-10-CM | POA: Diagnosis not present

## 2020-11-06 LAB — URINALYSIS, ROUTINE W REFLEX MICROSCOPIC
Bilirubin, UA: NEGATIVE
Glucose, UA: NEGATIVE
Ketones, UA: NEGATIVE
Leukocytes,UA: NEGATIVE
Nitrite, UA: NEGATIVE
Protein,UA: NEGATIVE
Specific Gravity, UA: 1.02 (ref 1.005–1.030)
Urobilinogen, Ur: 0.2 mg/dL (ref 0.2–1.0)
pH, UA: 7 (ref 5.0–7.5)

## 2020-11-06 MED ORDER — SILDENAFIL CITRATE 20 MG PO TABS: ORAL_TABLET | ORAL | 11 refills | Status: DC

## 2020-11-06 MED ORDER — TESTOSTERONE POWD: 5 refills | Status: DC

## 2020-11-06 NOTE — Progress Notes (Signed)
Subjective:  1. Urge incontinence   2. Hypogonadism male   3. History of prostate cancer       [name] [name] returns today in f/u for his history of hypogonadism and prostate cancer. He is s/p a RALP on 04/20/11. His PSA was <0.1 ion 10/30/20. He had Gleason 7(3+4) T2c N0 disease. He is doing well and has no SUI but can have UUI but that is rare if he is careful and has noted that once he gets the urge he can only hold it q15-20 min. He has no hematuria or dysuria. He has a good stream. His IPSS is 6.   He has had some erectile dysfunction that he manages with sildenafil '20mg'$ .   He remains on compounded testosterone 4% 3 clicks daily. His T level is 285 and his Hgb is 14.5 which is stable.   He is doing well with improved energy.   .  IPSS     Row Name 11/06/20 1300         International Prostate Symptom Score   How often have you had the sensation of not emptying your bladder? Less than half the time     How often have you had to urinate less than every two hours? Less than 1 in 5 times     How often have you found you stopped and started again several times when you urinated? Not at All     How often have you found it difficult to postpone urination? Not at All     How often have you had a weak urinary stream? Not at All     How often have you had to strain to start urination? Not at All     How many times did you typically get up at night to urinate? 3 Times     Total IPSS Score 6           Quality of Life due to urinary symptoms   If you were to spend the rest of your life with your urinary condition just the way it is now how would you feel about that? Pleased                   ROS:  ROS:  A complete review of systems was performed.  All systems are negative except for pertinent findings as noted.   ROS  Allergies  Allergen Reactions   Citalopram     Fatigue headache    Lipitor [Atorvastatin] Other (See Comments)    Myalgia    Lisinopril Cough    Dry  cough    Outpatient Encounter Medications as of 11/06/2020  Medication Sig   allopurinol (ZYLOPRIM) 100 MG tablet TAKE 1 TABLET EVERY DAY   AMBULATORY NON FORMULARY MEDICATION Medication Name: Testosterone 4% Gel  Apply [id number] ( 3 clicks) topically daily as directed   amLODipine (NORVASC) 5 MG tablet TAKE 1 TABLET EVERY DAY (NEEDS APPOINTMENT IN JULY)   aspirin 81 MG chewable tablet Chew 81 mg by mouth daily.    atorvastatin (LIPITOR) 40 MG tablet TAKE 1 TABLET EVERY DAY   B Complex-C (SUPER B COMPLEX PO) Take 1 tablet by mouth daily.   buPROPion (WELLBUTRIN XL) 150 MG 24 hr tablet TAKE ONE TABLET BY MOUTH DAILY *STOP CITALOPRAM PLEASE*   meloxicam (MOBIC) 7.5 MG tablet TAKE ONE TABLET BY MOUTH ONCE DAILY.   Multiple Vitamin (MULTIVITAMIN) tablet Take 1 tablet by mouth daily.   potassium chloride (KLOR-CON) 10 MEQ tablet TAKE  1 TABLET EVERY DAY   Testosterone POWD 4% testosterone cream apply 3 clicks topically 1 am   [DISCONTINUED] sildenafil (REVATIO) 20 MG tablet 1-5 po prn   sildenafil (REVATIO) 20 MG tablet 1-5 po prn   [DISCONTINUED] methocarbamol (ROBAXIN) 500 MG tablet Take 1 tablet (500 mg total) by mouth every 8 (eight) hours as needed for muscle spasms. (Patient not taking: Reported on 11/06/2020)   Facility-Administered Encounter Medications as of 11/06/2020  Medication   methylPREDNISolone acetate (DEPO-MEDROL) injection 80 mg    Past Medical History:  Diagnosis Date   Aortic atherosclerosis ([place]) 10/19/2016    Seen on chest x-ray July 2018   Arthritis    right knee and hip    Cancer [facility])    prostate cancer    Chronic kidney disease    prostate cancer    Diabetes mellitus without complication ([place])    GERD (gastroesophageal reflux disease)    Gout    Gout    Hypercholesteremia    Hyperglycemia    Hyperlipidemia    Hypertension    Sleep apnea    Status post THR (total hip replacement) 04/20/13 05/01/2013   Right total hip 04/20/2013 Dr. Lydia Guiles implant      Past Surgical History:  Procedure Laterality Date   COLONOSCOPY     COLONOSCOPY N/A 10/22/2013   Procedure: COLONOSCOPY;  Surgeon: Danie Binder, MD;  Location: [facility];  Service: Endoscopy;  Laterality: N/A;  [place]     right inguinal hernia repair    NASAL SEPTUM SURGERY     OTHER SURGICAL HISTORY     deviated septum surgery    ROBOT ASSISTED LAPAROSCOPIC RADICAL PROSTATECTOMY  04/20/2011   Procedure: ROBOTIC ASSISTED LAPAROSCOPIC RADICAL PROSTATECTOMY;  Surgeon: Malka So, MD;  Location: [facility] ORS;  Service: Urology;  Laterality: N/A;  Bilateral lymph node dissection   TOTAL HIP ARTHROPLASTY Right 04/20/2013   Procedure: TOTAL HIP ARTHROPLASTY;  Surgeon: Carole Civil, MD;  Location: [facility] ORS;  Service: Orthopedics;  Laterality: Right;   TOTAL KNEE ARTHROPLASTY Right 05/16/2018   Procedure: TOTAL KNEE ARTHROPLASTY;  Surgeon: Carole Civil, MD;  Location: [facility] ORS;  Service: Orthopedics;  Laterality: Right;    Social History   Socioeconomic History   Marital status: Single    Spouse name: Not on file   Number of children: Not on file   Years of education: Not on file   Highest education level: Not on file  Occupational History   Not on file  Tobacco Use   Smoking status: Former    Packs/day: 0.50    Years: 8.00    Pack years: 4.00    Types: Cigarettes    Quit date: 03/22/1984    Years since quitting: 36.6   Smokeless tobacco: Never  Vaping Use   Vaping Use: Never used  Substance and Sexual Activity   Alcohol use: Yes    Comment: occasional    Drug use: No   Sexual activity: Yes    Birth control/protection: None  Other Topics Concern   Not on file  Social History Narrative   Not on file   Social Determinants of Health   Financial Resource Strain: Not on file  Food Insecurity: Not on file  Transportation Needs: Not on file  Physical Activity: Not on file  Stress: Not on file  Social Connections: Not on file  Intimate Partner  Violence: Not on file    Family History  Problem Relation  Age of Onset   Diabetes Mother    Hypertension Mother    Hypertension Brother        Objective: BP (!) 148/69   Pulse 96     Physical Exam  Lab Results:  Results for orders placed or performed in visit on 11/06/20 (from the past 24 hour(s))  Urinalysis, Routine w reflex microscopic     Status: Abnormal   Collection Time: 11/06/20  1:43 PM  Result Value Ref Range   Specific Gravity, UA 1.020 1.005 - 1.030   pH, UA 7.0 5.0 - 7.5   Color, UA Yellow Yellow   Appearance Ur Clear Clear   Leukocytes,UA Negative Negative   Protein,UA Negative Negative/Trace   Glucose, UA Negative Negative   Ketones, UA Negative Negative   RBC, UA Trace (A) Negative   Bilirubin, UA Negative Negative   Urobilinogen, Ur 0.2 0.2 - 1.0 mg/dL   Nitrite, UA Negative Negative   Narrative   Performed at:  [facility] [address], [place], [place]  [id number] Lab Director: [place], Phone:  [phone]    BMET No results for input(s): NA, K, CL, CO2, GLUCOSE, BUN, CREATININE, CALCIUM in the last 72 hours. PSA PSA  Date Value Ref Range Status  05/09/2019 <0.1 < OR = 4.0 ng/mL Final    Comment:    The total PSA value from this assay system is  standardized against the WHO standard. The test  result will be approximately 20% lower when compared  to the equimolar-standardized total PSA (Beckman  Coulter). Comparison of serial PSA results should be  interpreted with this fact in mind. . This test was performed using the Siemens  chemiluminescent method. Values obtained from  different assay methods cannot be used interchangeably. PSA levels, regardless of value, should not be interpreted as absolute evidence of the presence or absence of disease.    Testosterone  Date Value Ref Range Status  10/30/2020 285 264 - 916 ng/dL Final    Comment:    Adult male reference interval is based on a population  of healthy nonobese males (BMI <30) between 75 and 63 years old. [place], [place] [phone]. PMID: [id number].   05/08/2020 308 264 - 916 ng/dL Final    Comment:    Adult male reference interval is based on a population of healthy nonobese males (BMI <30) between 62 and 50 years old. [place], [place] [phone]. PMID: [id number].   11/08/2019 214 (L) 264 - 916 ng/dL Final    Comment:    Adult male reference interval is based on a population of healthy nonobese males (BMI <30) between 82 and 46 years old. [place], [place] [phone]. PMID: [id number].    Hgb is 14.4.  UA is clear.  Results for orders placed or performed in visit on 11/06/20 (from the past 24 hour(s))  Urinalysis, Routine w reflex microscopic     Status: Abnormal   Collection Time: 11/06/20  1:43 PM  Result Value Ref Range   Specific Gravity, UA 1.020 1.005 - 1.030   pH, UA 7.0 5.0 - 7.5   Color, UA Yellow Yellow   Appearance Ur Clear Clear   Leukocytes,UA Negative Negative   Protein,UA Negative Negative/Trace   Glucose, UA Negative Negative   Ketones, UA Negative Negative   RBC, UA Trace (A) Negative   Bilirubin, UA Negative Negative   Urobilinogen, Ur 0.2 0.2 - 1.0 mg/dL   Nitrite, UA Negative Negative   Narrative   Performed  at:  [facility] [address], [place], [place]  [id number] Lab Director: [place], Phone:  [phone]     Studies/Results: No results found.    Assessment & Plan: Hypogonadism.  He is doing well on therapy.  Med refilled.  I will get labs today and in 6 months prior to f/u.  ED.  Sildenafil refilled.  Hx of prostate cancer.  PSA was undetectible at last check and will be repeated today and in 6 months.      Meds ordered this encounter  Medications   sildenafil (REVATIO) 20 MG tablet    Sig: 1-5 po prn    Dispense:  20 tablet    Refill:  11   Testosterone POWD    Sig: 4% testosterone cream apply 3 clicks  topically 1 am    Dispense:  60 g    Refill:  5    I am refilling his prior script.   I don't have a good way to do this electronically in Epic.      Orders Placed This Encounter  Procedures   Urinalysis, Routine w reflex microscopic   PSA    Standing Status:   Future    Standing Expiration Date:   11/06/2021   Testosterone    Standing Status:   Future    Standing Expiration Date:   11/06/2021   Hemoglobin and hematocrit, blood    Standing Status:   Future    Standing Expiration Date:   11/06/2021      Return in about 6 months (around 05/09/2021).   CC: Kathyrn Drown, MD      Irine Seal 11/06/2020 Patient ID: [name] [name], male   DOB: 04-Mar-1951, 70 y.o.   MRN: [id number]

## 2020-11-06 NOTE — Progress Notes (Signed)
Urological Symptom Review  Patient is experiencing the following symptoms: Frequent urination Hard to postpone urination Get up at night to urinate Leakage of urine   Review of Systems  Gastrointestinal (upper)  : Negative for upper GI symptoms  Gastrointestinal (lower) : Negative for lower GI symptoms  Constitutional : Night Sweats Fatigue  Skin: Negative for skin symptoms  Eyes: Negative for eye symptoms  Ear/Nose/Throat : Negative for Ear/Nose/Throat symptoms  Hematologic/Lymphatic: Negative for Hematologic/Lymphatic symptoms  Cardiovascular : Negative for cardiovascular symptoms  Respiratory : Negative for respiratory symptoms  Endocrine: Negative for endocrine symptoms  Musculoskeletal: Negative for musculoskeletal symptoms  Neurological: Negative for neurological symptoms  Psychologic: Negative for psychiatric symptoms

## 2020-11-17 ENCOUNTER — Other Ambulatory Visit: Payer: Self-pay

## 2020-11-17 ENCOUNTER — Ambulatory Visit: Payer: Medicare HMO | Admitting: Orthopedic Surgery

## 2020-11-17 VITALS — Ht 68.0 in | Wt 204.0 lb

## 2020-11-17 DIAGNOSIS — G8929 Other chronic pain: Secondary | ICD-10-CM | POA: Diagnosis not present

## 2020-11-17 DIAGNOSIS — M5441 Lumbago with sciatica, right side: Secondary | ICD-10-CM

## 2020-11-17 NOTE — Patient Instructions (Signed)
Dr Kennon Portela office will call you with the 3rd injection if you want to call them number is [phone]

## 2020-11-17 NOTE — Progress Notes (Signed)
Chief Complaint  Patient presents with   Follow-up    Recheck on back after second ESI    70 year old male status post 2 epidurals for right-sided leg pain and buttock pain  He got 30 to 35% improvement after 1 injection and up to 75% improvement after 2  He complains of a pulling pain when he bends over and he has trouble  getting things off the floor  We discussed 1/3 injection which she would like to proceed

## 2020-11-19 ENCOUNTER — Ambulatory Visit: Payer: Medicare HMO | Admitting: Orthopedic Surgery

## 2020-11-21 ENCOUNTER — Telehealth: Payer: Self-pay | Admitting: Physical Medicine and Rehabilitation

## 2020-11-21 NOTE — Telephone Encounter (Signed)
Patient returned call asked for a call back    [phone]

## 2020-11-26 DIAGNOSIS — Z79899 Other long term (current) drug therapy: Secondary | ICD-10-CM | POA: Diagnosis not present

## 2020-11-26 DIAGNOSIS — R7303 Prediabetes: Secondary | ICD-10-CM | POA: Diagnosis not present

## 2020-11-26 DIAGNOSIS — M1A9XX0 Chronic gout, unspecified, without tophus (tophi): Secondary | ICD-10-CM | POA: Diagnosis not present

## 2020-11-26 DIAGNOSIS — E785 Hyperlipidemia, unspecified: Secondary | ICD-10-CM | POA: Diagnosis not present

## 2020-11-27 LAB — LIPID PANEL
Chol/HDL Ratio: 3.5 ratio (ref 0.0–5.0)
Cholesterol, Total: 191 mg/dL (ref 100–199)
HDL: 55 mg/dL (ref >39)
LDL Chol Calc (NIH): 116 mg/dL — ABNORMAL HIGH (ref 0–99)
Triglycerides: 113 mg/dL (ref 0–149)
VLDL Cholesterol Cal: 20 mg/dL (ref 5–40)

## 2020-11-27 LAB — BASIC METABOLIC PANEL
BUN/Creatinine Ratio: 10 (ref 10–24)
BUN: 11 mg/dL (ref 8–27)
CO2: 25 mmol/L (ref 20–29)
Calcium: 9.3 mg/dL (ref 8.6–10.2)
Chloride: 94 mmol/L — ABNORMAL LOW (ref 96–106)
Creatinine, Ser: 1.14 mg/dL (ref 0.76–1.27)
Glucose: 120 mg/dL — ABNORMAL HIGH (ref 65–99)
Potassium: 4.8 mmol/L (ref 3.5–5.2)
Sodium: 138 mmol/L (ref 134–144)
eGFR: 68 mL/min/{1.73_m2} (ref >59)

## 2020-11-27 LAB — URIC ACID: Uric Acid: 5.6 mg/dL (ref 3.8–8.4)

## 2020-11-27 LAB — HEPATIC FUNCTION PANEL
ALT: 16 IU/L (ref 0–44)
AST: 21 IU/L (ref 0–40)
Albumin: 5 g/dL — ABNORMAL HIGH (ref 3.7–4.7)
Alkaline Phosphatase: 124 IU/L — ABNORMAL HIGH (ref 44–121)
Bilirubin Total: 1.2 mg/dL (ref 0.0–1.2)
Bilirubin, Direct: 0.23 mg/dL (ref 0.00–0.40)
Total Protein: 7.4 g/dL (ref 6.0–8.5)

## 2020-11-27 LAB — HEMOGLOBIN A1C
Est. average glucose Bld gHb Est-mCnc: 111 mg/dL
Hgb A1c MFr Bld: 5.5 % (ref 4.8–5.6)

## 2020-12-02 ENCOUNTER — Other Ambulatory Visit: Payer: Self-pay | Admitting: Orthopedic Surgery

## 2020-12-02 DIAGNOSIS — M79604 Pain in right leg: Secondary | ICD-10-CM

## 2020-12-03 ENCOUNTER — Ambulatory Visit: Payer: Medicare HMO | Admitting: Family Medicine

## 2020-12-03 ENCOUNTER — Ambulatory Visit (INDEPENDENT_AMBULATORY_CARE_PROVIDER_SITE_OTHER): Payer: Medicare HMO | Admitting: Family Medicine

## 2020-12-03 ENCOUNTER — Other Ambulatory Visit: Payer: Self-pay

## 2020-12-03 VITALS — BP 138/70 | HR 98 | Temp 98.1°F | Wt 206.0 lb

## 2020-12-03 DIAGNOSIS — Z23 Encounter for immunization: Secondary | ICD-10-CM | POA: Diagnosis not present

## 2020-12-03 DIAGNOSIS — R61 Generalized hyperhidrosis: Secondary | ICD-10-CM

## 2020-12-03 DIAGNOSIS — I872 Venous insufficiency (chronic) (peripheral): Secondary | ICD-10-CM | POA: Diagnosis not present

## 2020-12-03 DIAGNOSIS — I10 Essential (primary) hypertension: Secondary | ICD-10-CM | POA: Diagnosis not present

## 2020-12-03 MED ORDER — ALLOPURINOL 100 MG PO TABS: 100.0000 mg | ORAL_TABLET | Freq: Every day | ORAL | 1 refills | Status: DC

## 2020-12-03 MED ORDER — POTASSIUM CHLORIDE CRYS ER 10 MEQ PO TBCR: EXTENDED_RELEASE_TABLET | ORAL | 1 refills | Status: DC

## 2020-12-03 MED ORDER — ATORVASTATIN CALCIUM 40 MG PO TABS: ORAL_TABLET | ORAL | 1 refills | Status: DC

## 2020-12-03 MED ORDER — BUPROPION HCL ER (XL) 150 MG PO TB24: ORAL_TABLET | ORAL | 1 refills | Status: DC

## 2020-12-03 MED ORDER — AMLODIPINE BESYLATE 5 MG PO TABS: ORAL_TABLET | ORAL | 1 refills | Status: DC

## 2020-12-03 NOTE — Progress Notes (Signed)
   Subjective:    Patient ID: [name] [name], male    DOB: [birth year], 70 y.o.   MRN: [id number]  Hypertension 6 month follow up   Patient here for follow-up regarding cholesterol.    Diet-healthy diet  Compliance with medicine-takes medication regular basis  Side effects-no side effects  Activity-unable to do a lot of activity because of lumbar pain under the care of specialist possible surgery in the future  Regular lab work regarding lipid and liver was checked and if needing additional labs was appropriately ordered  Patient for blood pressure check up.  The patient does have hypertension.    Medication compliance-relates compliance  Blood pressure control recently-keeps blood pressure under good control  Dietary compliance-minimizes salt  Gout  Patient with underlying symptoms consistent with gout Follow-up  Having any current symptoms of gout flareup(if so what)?-None  Taking medication/compliance-yes regularly  Following low purine diet-as best he can  Having any problems with the medication-no troubles with the medicine  At least yearly uric acid level and metabolic 7  In addition this patient having some intermittent diaphoresis when he goes out to shop as he is pushing the cart he breaks out in severe sweats and feels very fatigued but denies chest pressure or tightness but does have underlying history of prediabetes which makes it possibly difficult to have true angina symptoms   Review of Systems     Objective:   Physical Exam General-in no acute distress Eyes-no discharge Lungs-respiratory rate normal, CTA CV-no murmurs,RRR Extremities skin warm dry no edema Neuro grossly normal Behavior normal, alert Patient very limited in his movement because of low back pain and sciatica  Eye exam recommended Has venous stasis changes of the lower legs but not severe no neuropathy    Assessment & Plan:   1. Benign essential hypertension Blood pressure  under good control continue medication watch diet stay active - Ambulatory referral to Cardiology  2. Venous stasis dermatitis of both lower extremities Venous stasis changes are noted no ulcers - Ambulatory referral to Cardiology  3. Diaphoresis Referral to cardiology because he breaks out in sweat when he is shopping and gets out of energy quickly hard to know if this is a underlying metabolic response to lack of exercise or if it could be a sign of blockages EKG looks good.  Patient does have history of prediabetes therefore may not have classic angina - EKG 12-Lead - Ambulatory referral to Cardiology  4. Encounter for immunization High-dose flu shot - Flu Vaccine QUAD High Dose(Fluad) Recommend COVID-vaccine in the near future  Follow-up in 6 months

## 2020-12-08 ENCOUNTER — Ambulatory Visit: Payer: Self-pay

## 2020-12-08 ENCOUNTER — Encounter: Payer: Self-pay | Admitting: Physical Medicine and Rehabilitation

## 2020-12-08 ENCOUNTER — Ambulatory Visit (INDEPENDENT_AMBULATORY_CARE_PROVIDER_SITE_OTHER): Payer: Medicare HMO | Admitting: Physical Medicine and Rehabilitation

## 2020-12-08 ENCOUNTER — Other Ambulatory Visit: Payer: Self-pay

## 2020-12-08 VITALS — BP 139/79 | HR 90

## 2020-12-08 DIAGNOSIS — M5416 Radiculopathy, lumbar region: Secondary | ICD-10-CM

## 2020-12-08 DIAGNOSIS — M48061 Spinal stenosis, lumbar region without neurogenic claudication: Secondary | ICD-10-CM

## 2020-12-08 MED ORDER — METHYLPREDNISOLONE ACETATE 80 MG/ML IJ SUSP
80.0000 mg | Freq: Once | INTRAMUSCULAR | Status: AC
  Administered 2020-12-08: 80 mg

## 2020-12-08 NOTE — Patient Instructions (Signed)

## 2020-12-08 NOTE — Progress Notes (Signed)
Pt state lower back pain that travels to his right hip and thigh. Pt state bending makes the pain worse. Pt state when he feels the pain he would stop what he is doing. Pt has hx of inj on  10/16/20 pt state it helped a little.  Numeric Pain Rating Scale and Functional Assessment Average Pain 4   In the last MONTH (on 0-10 scale) has pain interfered with the following?  1. General activity like being  able to carry out your everyday physical activities such as walking, climbing stairs, carrying groceries, or moving a chair?  Rating(8)   +Driver, -BT, -Dye Allergies.

## 2020-12-08 NOTE — Procedures (Signed)
Lumbosacral Transforaminal Epidural Steroid Injection - Sub-Pedicular Approach with Fluoroscopic Guidance  Patient: [name] [name]      Date of Birth: 1951-01-13 MRN: [id number] PCP: Kathyrn Drown, MD      Visit Date: 12/08/2020   Universal Protocol:    Date/Time: 12/08/2020  Consent Given By: the patient  Position: PRONE  Additional Comments: Vital signs were monitored before and after the procedure. Patient was prepped and draped in the usual sterile fashion. The correct patient, procedure, and site was verified.   Injection Procedure Details:   Procedure diagnoses: Lumbar radiculopathy [M54.16]    Meds Administered:  Meds ordered this encounter  Medications   methylPREDNISolone acetate (DEPO-MEDROL) injection 80 mg    Laterality: Right  Location/Site: L5  Needle:5.0 in., 22 ga.  Short bevel or Quincke spinal needle  Needle Placement: Transforaminal  Findings:    -Comments: Excellent flow of contrast along the nerve, nerve root and into the epidural space.  Procedure Details: After squaring off the end-plates to get a true AP view, the C-arm was positioned so that an oblique view of the foramen as noted above was visualized. The target area is just inferior to the "nose of the scotty dog" or sub pedicular. The soft tissues overlying this structure were infiltrated with 2-3 ml. of 1% Lidocaine without Epinephrine.  The spinal needle was inserted toward the target using a "trajectory" view along the fluoroscope beam.  Under AP and lateral visualization, the needle was advanced so it did not puncture dura and was located close the 6 O'Clock position of the pedical in AP tracterory. Biplanar projections were used to confirm position. Aspiration was confirmed to be negative for CSF and/or blood. A 1-2 ml. volume of Isovue-250 was injected and flow of contrast was noted at each level. Radiographs were obtained for documentation purposes.   After attaining the desired  flow of contrast documented above, a 0.5 to 1.0 ml test dose of 0.25% Marcaine was injected into each respective transforaminal space.  The patient was observed for 90 seconds post injection.  After no sensory deficits were reported, and normal lower extremity motor function was noted,   the above injectate was administered so that equal amounts of the injectate were placed at each foramen (level) into the transforaminal epidural space.   Additional Comments:  The patient tolerated the procedure well Dressing: 2 x 2 sterile gauze and Band-Aid    Post-procedure details: Patient was observed during the procedure. Post-procedure instructions were reviewed.  Patient left the clinic in stable condition.

## 2020-12-08 NOTE — Progress Notes (Signed)
[name] [name] - 70 y.o. male MRN [id number]  Date of birth: [birth year]  Office Visit Note: Visit Date: 12/08/2020 PCP: Kathyrn Drown, MD Referred by: Kathyrn Drown, MD  Subjective: Chief Complaint  Patient presents with   Lower Back - Pain   Right Hip - Pain   Right Thigh - Pain   HPI:  [name] [name] is a 70 y.o. male who comes in today at the request of Dr. [name] for planned Right L5-S1 Lumbar Transforaminal epidural steroid injection with fluoroscopic guidance.  The patient has failed conservative care including home exercise, medications, time and activity modification.  This injection will be diagnostic and hopefully therapeutic.  Please see requesting physician notes for further details and justification.   ROS Otherwise per HPI.  Assessment & Plan: Visit Diagnoses:    ICD-10-CM   1. Lumbar radiculopathy  M54.16 XR C-ARM NO REPORT    Epidural Steroid injection    methylPREDNISolone acetate (DEPO-MEDROL) injection 80 mg    2. Stenosis of lateral recess of lumbar spine  M48.061 XR C-ARM NO REPORT    Epidural Steroid injection    methylPREDNISolone acetate (DEPO-MEDROL) injection 80 mg      Plan: No additional findings.   Meds & Orders:  Meds ordered this encounter  Medications   methylPREDNISolone acetate (DEPO-MEDROL) injection 80 mg    Orders Placed This Encounter  Procedures   XR C-ARM NO REPORT   Epidural Steroid injection    Follow-up: Return if symptoms worsen or fail to improve.   Procedures: No procedures performed  Lumbosacral Transforaminal Epidural Steroid Injection - Sub-Pedicular Approach with Fluoroscopic Guidance  Patient: [name] [name]      Date of Birth: February 14, 1951 MRN: [id number] PCP: Kathyrn Drown, MD      Visit Date: 12/08/2020   Universal Protocol:    Date/Time: 12/08/2020  Consent Given By: the patient  Position: PRONE  Additional Comments: Vital signs were monitored before and after the  procedure. Patient was prepped and draped in the usual sterile fashion. The correct patient, procedure, and site was verified.   Injection Procedure Details:   Procedure diagnoses: Lumbar radiculopathy [M54.16]    Meds Administered:  Meds ordered this encounter  Medications   methylPREDNISolone acetate (DEPO-MEDROL) injection 80 mg    Laterality: Right  Location/Site: L5  Needle:5.0 in., 22 ga.  Short bevel or Quincke spinal needle  Needle Placement: Transforaminal  Findings:    -Comments: Excellent flow of contrast along the nerve, nerve root and into the epidural space.  Procedure Details: After squaring off the end-plates to get a true AP view, the C-arm was positioned so that an oblique view of the foramen as noted above was visualized. The target area is just inferior to the "nose of the scotty dog" or sub pedicular. The soft tissues overlying this structure were infiltrated with 2-3 ml. of 1% Lidocaine without Epinephrine.  The spinal needle was inserted toward the target using a "trajectory" view along the fluoroscope beam.  Under AP and lateral visualization, the needle was advanced so it did not puncture dura and was located close the 6 O'Clock position of the pedical in AP tracterory. Biplanar projections were used to confirm position. Aspiration was confirmed to be negative for CSF and/or blood. A 1-2 ml. volume of Isovue-250 was injected and flow of contrast was noted at each level. Radiographs were obtained for documentation purposes.   After attaining the desired flow of contrast documented above, a 0.5 to  1.0 ml test dose of 0.25% Marcaine was injected into each respective transforaminal space.  The patient was observed for 90 seconds post injection.  After no sensory deficits were reported, and normal lower extremity motor function was noted,   the above injectate was administered so that equal amounts of the injectate were placed at each foramen (level) into the  transforaminal epidural space.   Additional Comments:  The patient tolerated the procedure well Dressing: 2 x 2 sterile gauze and Band-Aid    Post-procedure details: Patient was observed during the procedure. Post-procedure instructions were reviewed.  Patient left the clinic in stable condition.    Clinical History: MRI LUMBAR SPINE WITHOUT CONTRAST   TECHNIQUE: Multiplanar, multisequence MR imaging of the lumbar spine was performed. No intravenous contrast was administered.   COMPARISON:  Lumbar radiographs 02/25/2020 without report.   FINDINGS: Segmentation: 5 non rib-bearing lumbar vertebral bodies with small ribs at T12 on prior lumbar radiographs.   Alignment: Mild levocurvature of the lower lumbar spine. Slight retrolisthesis of L2 on L3.   Vertebrae: Vertebral body heights are maintained. No evidence of acute fracture, discitis/osteomyelitis, or suspicious bone lesion. Benign L1 vertebral venous malformation. Bulky anterior bridging osteophytes multiple levels.   Conus medullaris and cauda equina: Conus extends to the L2 level. Conus appears normal.   Paraspinal and other soft tissues: Unremarkable   Disc levels:   T12-L1: No significant disc protrusion, foraminal stenosis, or canal stenosis.   L1-L2: Mild disc bulging and bilateral facet hypertrophy without significant canal or foraminal stenosis.   L2-L3: Slight retrolisthesis of L2 on L3. Broad disc bulge and mild bilateral facet hypertrophy with ligamentum flavum thickening. Mild bilateral foraminal stenosis. No significant canal stenosis.   L3-L4: Broad disc bulge and moderate bilateral facet hypertrophy and ligamentum flavum thickening. Resulting mild bilateral foraminal stenosis. Mild canal stenosis.   L4-L5: Broad disc bulge with superimposed right subarticular/foraminal disc protrusion. Severe bilateral facet hypertrophy. Resulting moderate canal stenosis with severe right subarticular  recess stenosis and suspected impingement of the descending right L5 nerve roots. Moderate right greater than left foraminal stenosis.   L5-S1: Small broad disc bulge and moderate to severe bilateral facet hypertrophy. Resulting mild bilateral foraminal stenosis. There is tapering of the canal at this level with prominent epidural fat.   IMPRESSION: 1. At L4-L5, moderate canal stenosis with severe right subarticular recess stenosis and suspected impingement of the descending right L5 nerve roots. Moderate right greater than left foraminal stenosis at this level. 2. Mild canal stenosis at L3-L4 and multilevel mild foraminal stenosis as detailed above.     Electronically Signed   By: Margaretha Sheffield MD   On: 09/03/2020 13:45     Objective:  VS:  HT:    WT:   BMI:     BP:139/79  HR:90bpm  TEMP: ( )  RESP:  Physical Exam Vitals and nursing note reviewed.  Constitutional:      General: He is not in acute distress.    Appearance: Normal appearance. He is not ill-appearing.  HENT:     Head: Normocephalic and atraumatic.     Right Ear: External ear normal.     Left Ear: External ear normal.     Nose: No congestion.  Eyes:     Extraocular Movements: Extraocular movements intact.  Cardiovascular:     Rate and Rhythm: Normal rate.     Pulses: Normal pulses.  Pulmonary:     Effort: Pulmonary effort is normal. No respiratory distress.  Abdominal:  General: There is no distension.     Palpations: Abdomen is soft.  Musculoskeletal:        General: No tenderness or signs of injury.     Cervical back: Neck supple.     Right lower leg: No edema.     Left lower leg: No edema.     Comments: Patient has good distal strength without clonus.  Skin:    Findings: No erythema or rash.  Neurological:     General: No focal deficit present.     Mental Status: He is alert and oriented to person, place, and time.     Sensory: No sensory deficit.     Motor: No weakness or abnormal  muscle tone.     Coordination: Coordination normal.  Psychiatric:        Mood and Affect: Mood normal.        Behavior: Behavior normal.     Imaging: XR C-ARM NO REPORT  Result Date: 12/08/2020 Please see Notes tab for imaging impression.

## 2020-12-14 ENCOUNTER — Ambulatory Visit (INDEPENDENT_AMBULATORY_CARE_PROVIDER_SITE_OTHER): Payer: Medicare HMO

## 2020-12-14 VITALS — Ht 68.0 in | Wt 206.0 lb

## 2020-12-14 DIAGNOSIS — Z0000 Encounter for general adult medical examination without abnormal findings: Secondary | ICD-10-CM

## 2020-12-14 NOTE — Progress Notes (Signed)
Subjective:   [name] [name] is a 70 y.o. male who presents for Medicare Annual/Subsequent preventive examination.  I connected with Consepcion Hearing on 12/14/2020 by an audio enabled telemedicine application and verified that I am speaking with the correct person using two identifiers.   I discussed the limitations of evaluation and management by telemedicine. The patient expressed understanding and agreed to proceed.   Location of Patient: HOME Location of Provider: OFFICE Persons participating in visit: Consepcion Hearing, and Wyatt Haste, CMA   Review of Systems    Defer to PCP Cardiac Risk Factors include: advanced age (>80men, >18 women);male gender;smoking/ tobacco exposure;hypertension     Objective:    Today's Vitals   12/14/20 0810 12/14/20 0816  Weight: 206 lb (93.4 kg)   Height: 5\' 8"  (1.727 m)   PainSc:  3    Body mass index is 31.32 kg/m.  Advanced Directives 12/14/2020 06/23/2018 06/01/2018 05/29/2018 05/25/2018 05/23/2018 05/22/2018  Does Patient Have a Medical Advance Directive? No No No No No No No  Would patient like information on creating a medical advance directive? No - Patient declined - No - Patient declined No - Patient declined No - Patient declined No - Patient declined No - Patient declined  Pre-existing out of facility DNR order (yellow form or pink MOST form) - - - - - - -    Current Medications (verified) Outpatient Encounter Medications as of 12/14/2020  Medication Sig   allopurinol (ZYLOPRIM) 100 MG tablet Take 1 tablet (100 mg total) by mouth daily.   AMBULATORY NON FORMULARY MEDICATION Medication Name: Testosterone 4% Gel  Apply 8.11BJ ( 3 clicks) topically daily as directed   amLODipine (NORVASC) 5 MG tablet TAKE 1 TABLET EVERY DAY   aspirin 81 MG chewable tablet Chew 81 mg by mouth daily.    atorvastatin (LIPITOR) 40 MG tablet TAKE 1 TABLET EVERY DAY   B Complex-C (SUPER B COMPLEX PO) Take 1 tablet by mouth daily.   buPROPion (WELLBUTRIN XL)  150 MG 24 hr tablet TAKE ONE TABLET BY MOUTH DAILY *STOP CITALOPRAM PLEASE*   meloxicam (MOBIC) 7.5 MG tablet TAKE ONE TABLET BY MOUTH ONCE DAILY.   Multiple Vitamin (MULTIVITAMIN) tablet Take 1 tablet by mouth daily.   potassium chloride (KLOR-CON) 10 MEQ tablet TAKE 1 TABLET EVERY DAY   sildenafil (REVATIO) 20 MG tablet 1-5 po prn   Testosterone POWD 4% testosterone cream apply 3 clicks topically 1 am   No facility-administered encounter medications on file as of 12/14/2020.    Allergies (verified) Citalopram, Lipitor [atorvastatin], and Lisinopril   History: Past Medical History:  Diagnosis Date   Aortic atherosclerosis ([place]) 10/19/2016    Seen on chest x-ray July 2018   Arthritis    right knee and hip    Cancer [facility])    prostate cancer    Chronic kidney disease    prostate cancer    Diabetes mellitus without complication ([place])    GERD (gastroesophageal reflux disease)    Gout    Gout    Hypercholesteremia    Hyperglycemia    Hyperlipidemia    Hypertension    Sleep apnea    Status post THR (total hip replacement) 04/20/13 05/01/2013   Right total hip 04/20/2013 Dr. Lydia Guiles implant    Past Surgical History:  Procedure Laterality Date   COLONOSCOPY     COLONOSCOPY N/A 10/22/2013   Procedure: COLONOSCOPY;  Surgeon: Danie Binder, MD;  Location: [facility];  Service: Endoscopy;  Laterality:  N/A;  1115   HERNIA REPAIR     right inguinal hernia repair    NASAL SEPTUM SURGERY     OTHER SURGICAL HISTORY     deviated septum surgery    ROBOT ASSISTED LAPAROSCOPIC RADICAL PROSTATECTOMY  04/20/2011   Procedure: ROBOTIC ASSISTED LAPAROSCOPIC RADICAL PROSTATECTOMY;  Surgeon: Malka So, MD;  Location: [facility] ORS;  Service: Urology;  Laterality: N/A;  Bilateral lymph node dissection   TOTAL HIP ARTHROPLASTY Right 04/20/2013   Procedure: TOTAL HIP ARTHROPLASTY;  Surgeon: Carole Civil, MD;  Location: [facility] ORS;  Service: Orthopedics;  Laterality: Right;   TOTAL KNEE  ARTHROPLASTY Right 05/16/2018   Procedure: TOTAL KNEE ARTHROPLASTY;  Surgeon: Carole Civil, MD;  Location: [facility] ORS;  Service: Orthopedics;  Laterality: Right;   Family History  Problem Relation Age of Onset   Diabetes Mother    Hypertension Mother    Hypertension Brother    Social History   Socioeconomic History   Marital status: Single    Spouse name: Not on file   Number of children: Not on file   Years of education: Not on file   Highest education level: Not on file  Occupational History   Not on file  Tobacco Use   Smoking status: Former    Packs/day: 0.50    Years: 8.00    Pack years: 4.00    Types: Cigarettes    Quit date: 03/22/1984    Years since quitting: 36.7   Smokeless tobacco: Never  Vaping Use   Vaping Use: Never used  Substance and Sexual Activity   Alcohol use: Yes    Comment: occasional    Drug use: No   Sexual activity: Yes    Birth control/protection: None  Other Topics Concern   Not on file  Social History Narrative   Not on file   Social Determinants of Health   Financial Resource Strain: Low Risk    Difficulty of Paying Living Expenses: Not hard at all  Food Insecurity: No Food Insecurity   Worried About Charity fundraiser in the Last Year: Never true   [place] in the Last Year: Never true  Transportation Needs: No Transportation Needs   Lack of Transportation (Medical): No   Lack of Transportation (Non-Medical): No  Physical Activity: Insufficiently Active   Days of Exercise per Week: 2 days   Minutes of Exercise per Session: 10 min  Stress: No Stress Concern Present   Feeling of Stress : Not at all  Social Connections: Moderately Isolated   Frequency of Communication with Friends and Family: More than three times a week   Frequency of Social Gatherings with Friends and Family: More than three times a week   Attends Religious Services: Never   Marine scientist or Organizations: No   Attends Arts administrator: Never   Marital Status: Living with partner    Tobacco Counseling Counseling given: Yes   Clinical Intake:  Pre-visit preparation completed: Yes  Pain : 0-10 Pain Score: 3  Pain Type: Chronic pain Pain Location: Back Pain Orientation: Lower Pain Radiating Towards: Right hip Pain Descriptors / Indicators: Discomfort, Throbbing Pain Onset: More than a month ago Pain Frequency: Intermittent     BMI - recorded: 31.32 Nutritional Status: BMI > 30  Obese Diabetes: No     Diabetic? NO  Interpreter Needed?: No  Information entered by :: Wyatt Haste, [place]   Activities of Daily Living In your present state  of health, do you have any difficulty performing the following activities: 12/14/2020  Hearing? N  Vision? N  Difficulty concentrating or making decisions? N  Walking or climbing stairs? Y  Dressing or bathing? N  Doing errands, shopping? Y  Preparing Food and eating ? N  Using the Toilet? N  In the past six months, have you accidently leaked urine? Y  Do you have problems with loss of bowel control? N  Managing your Medications? N  Managing your Finances? N  Housekeeping or managing your Housekeeping? N  Some recent data might be hidden    Patient Care Team: Kathyrn Drown, MD as PCP - General (Family Medicine)  Indicate any recent Medical Services you may have received from other than [facility] providers in the past year (date may be approximate).     Assessment:   This is a routine wellness examination for [place].  Hearing/Vision screen Hearing Screening - Comments:: No concerns. Vision Screening - Comments:: Wears prescription glasses. [facility] [place] is who see's patient for exam.  Dietary issues and exercise activities discussed: Current Exercise Habits: The patient does not participate in regular exercise at present, Exercise limited by: orthopedic condition(s)   Goals Addressed             This Visit's Progress     Patient Stated       Wants to get back to working his part-time job.       Depression Screen PHQ 2/9 Scores 12/14/2020 12/03/2020 06/02/2020 11/01/2019 10/30/2018 10/28/2016 10/06/2015  PHQ - 2 Score 0 0 0 5 3 0 0  PHQ- 9 Score - - 2 8 5  - -    Fall Risk Fall Risk  12/14/2020 12/03/2020 06/02/2020 11/01/2019 10/30/2018  Falls in the past year? 0 0 - 0 0  Number falls in past yr: 0 0 0 - -  Injury with Fall? 0 0 - - -  Risk for fall due to : No Fall Risks No Fall Risks - - -  Follow up Falls evaluation completed Falls evaluation completed Falls evaluation completed Falls evaluation completed -    FALL RISK PREVENTION PERTAINING TO THE HOME:  Any stairs in or around the home? Yes  If so, are there any without handrails? Yes  Home free of loose throw rugs in walkways, pet beds, electrical cords, etc? Yes  Adequate lighting in your home to reduce risk of falls? Yes   ASSISTIVE DEVICES UTILIZED TO PREVENT FALLS:  Life alert? No  Use of a cane, walker or w/c? Yes  Grab bars in the bathroom? No  Shower chair or bench in shower? No  Elevated toilet seat or a handicapped toilet? No   TIMED UP AND GO:  Was the test performed? No .  Length of time to ambulate 10 feet: N/A sec.    Cognitive Function:     6CIT Screen 12/14/2020  What Year? 0 points  What month? 0 points  What time? 0 points  Count back from 20 0 points  Months in reverse 0 points  Repeat phrase 0 points  Total Score 0    Immunizations Immunization History  Administered Date(s) Administered   Fluad Quad(high Dose 65+) 12/03/2020   Influenza,inj,Quad PF,6+ Mos 01/17/2014, 12/12/2014, 01/19/2016, 01/18/2017, 01/19/2018, 01/09/2019   Influenza-Unspecified 01/04/2020   Moderna Sars-Covid-2 Vaccination 05/03/2019, 06/01/2019, 01/19/2020   Pneumococcal Conjugate-13 01/17/2014   Pneumococcal Polysaccharide-23 03/04/2015   Zoster Recombinat (Shingrix) 11/11/2017, 04/12/2018   Zoster, Live 03/20/2014  TDAP status:  Due, Education has been provided regarding the importance of this vaccine. Advised may receive this vaccine at local pharmacy or Health Dept. Aware to provide a copy of the vaccination record if obtained from local pharmacy or Health Dept. Verbalized acceptance and understanding.  Flu Vaccine status: Up to date  Pneumococcal vaccine status: Up to date  Covid-19 vaccine status: Completed vaccines  Qualifies for Shingles Vaccine? Yes   Zostavax completed Yes   Shingrix Completed?: Yes  Screening Tests Health Maintenance  Topic Date Due   TETANUS/TDAP  Never done   OPHTHALMOLOGY EXAM  04/22/2018   URINE MICROALBUMIN  10/30/2019   COVID-19 Vaccine (4 - Booster for Moderna series) 04/12/2020   HEMOGLOBIN A1C  05/26/2021   FOOT EXAM  12/03/2021   COLONOSCOPY (Pts 45-22yrs Insurance coverage will need to be confirmed)  10/23/2023   INFLUENZA VACCINE  Completed   Hepatitis C Screening  Completed   Zoster Vaccines- Shingrix  Completed   HPV VACCINES  Aged Out    Health Maintenance  Health Maintenance Due  Topic Date Due   TETANUS/TDAP  Never done   OPHTHALMOLOGY EXAM  04/22/2018   URINE MICROALBUMIN  10/30/2019   COVID-19 Vaccine (4 - Booster for Moderna series) 04/12/2020    Colorectal cancer screening: Type of screening: Colonoscopy. Completed 10/22/2013. Repeat every 10 years  Lung Cancer Screening: (Low Dose CT Chest recommended if Age 70-80 years, 30 pack-year currently smoking OR have quit w/in 15years.) does not qualify.   Lung Cancer Screening Referral: Does not qualify  Additional Screening:  Hepatitis C Screening: does qualify; Completed 03/26/2013  Vision Screening: Recommended annual ophthalmology exams for early detection of glaucoma and other disorders of the eye. Is the patient up to date with their annual eye exam?  Yes  Who is the provider or what is the name of the office in which the patient attends annual eye exams? [facility] in [place], [place] If pt  is not established with a provider, would they like to be referred to a provider to establish care? No .   Dental Screening: Recommended annual dental exams for proper oral hygiene  Community Resource Referral / Chronic Care Management: CRR required this visit?  No   CCM required this visit?  No      Plan:     I have personally reviewed and noted the following in the patient's chart:   Medical and social history Use of alcohol, tobacco or illicit drugs  Current medications and supplements including opioid prescriptions. Patient is not currently taking opioid prescriptions. Functional ability and status Nutritional status Physical activity Advanced directives List of other physicians Hospitalizations, surgeries, and ER visits in previous 12 months Vitals Screenings to include cognitive, depression, and falls Referrals and appointments  In addition, I have reviewed and discussed with patient certain preventive protocols, quality metrics, and best practice recommendations. A written personalized care plan for preventive services as well as general preventive health recommendations were provided to patient.     Clista Bernhardt, [place]   12/14/2020   Nurse Notes:  Non face to face 25 Encounter.   Mr. Valentine , Thank you for taking time to come for your Medicare Wellness Visit. I appreciate your ongoing commitment to your health goals. Please review the following plan we discussed and let me know if I can assist you in the future.   These are the goals we discussed:  Goals      Patient Stated  Wants to get back to working his part-time job.        This is a list of the screening recommended for you and due dates:  Health Maintenance  Topic Date Due   Tetanus Vaccine  Never done   Eye exam for diabetics  04/22/2018   Urine Protein Check  10/30/2019   COVID-19 Vaccine (4 - Booster for Moderna series) 04/12/2020   Hemoglobin A1C  05/26/2021   Complete foot exam    12/03/2021   Colon Cancer Screening  10/23/2023   Flu Shot  Completed   Hepatitis C Screening: USPSTF Recommendation to screen - Ages 18-79 yo.  Completed   Zoster (Shingles) Vaccine  Completed   HPV Vaccine  Aged Out

## 2020-12-24 ENCOUNTER — Other Ambulatory Visit: Payer: Self-pay | Admitting: Family Medicine

## 2020-12-29 ENCOUNTER — Other Ambulatory Visit: Payer: Self-pay

## 2020-12-29 ENCOUNTER — Ambulatory Visit: Payer: Medicare HMO | Admitting: Orthopedic Surgery

## 2020-12-29 ENCOUNTER — Encounter: Payer: Self-pay | Admitting: Orthopedic Surgery

## 2020-12-29 VITALS — BP 184/92 | HR 94 | Ht 68.0 in | Wt 200.0 lb

## 2020-12-29 DIAGNOSIS — Z96651 Presence of right artificial knee joint: Secondary | ICD-10-CM | POA: Diagnosis not present

## 2020-12-29 DIAGNOSIS — M5441 Lumbago with sciatica, right side: Secondary | ICD-10-CM

## 2020-12-29 DIAGNOSIS — M79604 Pain in right leg: Secondary | ICD-10-CM

## 2020-12-29 DIAGNOSIS — G8929 Other chronic pain: Secondary | ICD-10-CM

## 2020-12-29 DIAGNOSIS — Z96641 Presence of right artificial hip joint: Secondary | ICD-10-CM

## 2020-12-29 MED ORDER — MELOXICAM 7.5 MG PO TABS: 7.5000 mg | ORAL_TABLET | Freq: Every day | ORAL | 5 refills | Status: DC

## 2020-12-29 NOTE — Progress Notes (Signed)
Chief Complaint  Patient presents with   Back Pain    Still has pain but has improved some "functionally"    Encounter Diagnoses  Name Primary?   Chronic right-sided low back pain with right-sided sciatica Yes   S/P hip replacement, right 04/20/13    Status post right knee replacement    Pain in right leg    [name] [name] has had his 3 epidurals he says his mobility is better but he still has pain on the right side of his lower back into his right buttock area.  He says he is not sure if he wants to see a surgeon or if he wants to get 1/4 injection which she said was available according to the radiologist/pain management specialist  The pain location has not changed in its brought on by bending he says when he stands or walks or sits he has no discomfort  He has made some accommodations at home by moving things to different shelves  At this point I agree that he can wait to see if things continue to improve  I will continue his meloxicam once a day for 90 days  Follow-up leave open  Meds ordered this encounter  Medications   meloxicam (MOBIC) 7.5 MG tablet    Sig: Take 1 tablet (7.5 mg total) by mouth daily.    Dispense:  90 tablet    Refill:  5

## 2020-12-30 ENCOUNTER — Other Ambulatory Visit: Payer: Self-pay | Admitting: Orthopedic Surgery

## 2020-12-30 DIAGNOSIS — M79604 Pain in right leg: Secondary | ICD-10-CM

## 2020-12-30 MED ORDER — MELOXICAM 7.5 MG PO TABS: 7.5000 mg | ORAL_TABLET | Freq: Every day | ORAL | 5 refills | Status: AC

## 2020-12-30 NOTE — Progress Notes (Signed)
Meds ordered this encounter  Medications   meloxicam (MOBIC) 7.5 MG tablet    Sig: Take 1 tablet (7.5 mg total) by mouth daily.    Dispense:  90 tablet    Refill:  5

## 2021-01-01 ENCOUNTER — Telehealth: Payer: Self-pay | Admitting: Orthopedic Surgery

## 2021-01-01 ENCOUNTER — Other Ambulatory Visit: Payer: Self-pay | Admitting: Orthopedic Surgery

## 2021-01-01 DIAGNOSIS — G8929 Other chronic pain: Secondary | ICD-10-CM

## 2021-01-01 DIAGNOSIS — M5441 Lumbago with sciatica, right side: Secondary | ICD-10-CM

## 2021-01-01 MED ORDER — GABAPENTIN 300 MG PO CAPS: 300.0000 mg | ORAL_CAPSULE | Freq: Three times a day (TID) | ORAL | 0 refills | Status: DC

## 2021-01-01 NOTE — Progress Notes (Signed)
Meds ordered this encounter  Medications   gabapentin (NEURONTIN) 300 MG capsule    Sig: Take 1 capsule (300 mg total) by mouth 3 (three) times daily.    Dispense:  60 capsule    Refill:  0

## 2021-01-01 NOTE — Telephone Encounter (Signed)
Patient called requesting pain medicine.   He said he thought Dr. Aline Brochure was going to call in Gabapentin in for him and he hasn't yet

## 2021-01-20 ENCOUNTER — Telehealth: Payer: Self-pay | Admitting: Physical Medicine and Rehabilitation

## 2021-01-20 NOTE — Telephone Encounter (Signed)
Patient called needing to schedule an appointment with Dr Ernestina Patches for his right hip and lower back. The number to contact patient is [phone]

## 2021-01-20 NOTE — Telephone Encounter (Signed)
Right L5 TF on 12/08/20. Ok to repeat if helped, same problem/side, and no new injury?

## 2021-01-20 NOTE — Telephone Encounter (Signed)
Scheduled for OV. 

## 2021-01-23 ENCOUNTER — Other Ambulatory Visit: Payer: Self-pay

## 2021-01-23 ENCOUNTER — Ambulatory Visit: Payer: Medicare HMO | Admitting: Physical Medicine and Rehabilitation

## 2021-01-23 ENCOUNTER — Encounter: Payer: Self-pay | Admitting: Physical Medicine and Rehabilitation

## 2021-01-23 VITALS — BP 166/73 | HR 96

## 2021-01-23 DIAGNOSIS — M48061 Spinal stenosis, lumbar region without neurogenic claudication: Secondary | ICD-10-CM | POA: Diagnosis not present

## 2021-01-23 DIAGNOSIS — M5416 Radiculopathy, lumbar region: Secondary | ICD-10-CM | POA: Diagnosis not present

## 2021-01-23 DIAGNOSIS — M5441 Lumbago with sciatica, right side: Secondary | ICD-10-CM

## 2021-01-23 DIAGNOSIS — M47816 Spondylosis without myelopathy or radiculopathy, lumbar region: Secondary | ICD-10-CM | POA: Diagnosis not present

## 2021-01-23 DIAGNOSIS — G8929 Other chronic pain: Secondary | ICD-10-CM

## 2021-01-23 NOTE — Progress Notes (Signed)
Pt state lower back pain that travels to his right hip to the knee. Pt state bending makes the pain worse. Pt state he try not to put so much weight on his right leg.  Numeric Pain Rating Scale and Functional Assessment Average Pain 8 Pain Right Now 1 My pain is intermittent, sharp, tingling, and aching Pain is worse with: bending and some activites Pain improves with: rest   In the last MONTH (on 0-10 scale) has pain interfered with the following?  1. General activity like being  able to carry out your everyday physical activities such as walking, climbing stairs, carrying groceries, or moving a chair?  Rating(4)  2. Relation with others like being able to carry out your usual social activities and roles such as  activities at home, at work and in your community. Rating(5)  3. Enjoyment of life such that you have  been bothered by emotional problems such as feeling anxious, depressed or irritable?  Rating(6)

## 2021-01-23 NOTE — Progress Notes (Signed)
[name] [name] - 70 y.o. male MRN [id number]  Date of birth: 02-14-1951  Office Visit Note: Visit Date: 01/23/2021 PCP: Kathyrn Drown, MD Referred by: Kathyrn Drown, MD  Subjective: Chief Complaint  Patient presents with   Lower Back - Pain   Right Hip - Pain   Right Knee - Pain   HPI: [name] [name] is a 70 y.o. male who comes in today for evaluation of chronic, worsening and severe bilateral lower back pain radiating to right hip, leg and knee. Patient reports chronic pain ongoing for several years. Patient reports pain is exacerbated by walking, standing and bending over. Patient describes pain as soreness and shooting, currently rates as 7 out of 10. Patient states some relief of pain with home exercise program, rest and medications.  Patient states he does take meloxicam daily at home which seems to help alleviate some of his pain.  Patient's recent right L5 transforaminal epidural steroid injection gave him less than 30% relief of pain.  Patient's recent lumbar MRI exhibits moderate canal stenosis with severe right subarticular recess stenosis and suspected impingement of the descending right L5 nerve roots. Overall, patient verbalizes that he feels the epidural injections have not been beneficial in reducing his pain and increasing his functionality.  At this time patient states he does not wish to repeat a lumbar epidural steroid injection and would prefer a referral for surgical consultation.  Patient has a history of right sided total knee arthoplasty in 2020 and right-sided total hip arthroplasty in 2015 both performed by Dr. [name].  Patient is currently using a cane to assist with balance and ambulation.  Patient denies focal weakness, numbness and tingling.  Patient denies recent trauma or falls.  Review of Systems  Musculoskeletal:  Positive for back pain.  Neurological:  Negative for tingling, sensory change, focal weakness and weakness.  All other systems  reviewed and are negative. Otherwise per HPI.  Assessment & Plan: Visit Diagnoses:    ICD-10-CM   1. Lumbar radiculopathy  M54.16 Ambulatory referral to Neurosurgery    2. Chronic bilateral low back pain with right-sided sciatica  M54.41    G89.29     3. Stenosis of lateral recess of lumbar spine  M48.061 Ambulatory referral to Neurosurgery    4. Facet hypertrophy of lumbar region  M47.816        Plan: Findings:  Chronic, worsening and severe recalcitrant bilateral lower back pain radiating to right hip, leg and knee.  Patient continues to have excruciating and debilitating pain despite good conservative therapy such as home exercise program, rest and medications.  Patient's clinical presentation and exam are consistent with classic L5 nerve pattern.  Patient does have moderate spinal canal stenosis and severe right subarticular recess stenosis at L4-L5 causing impingement of the descending right L5 nerve roots.  At this time we do not recommend repeating lumbar epidural injections as patient has not had good and sustained pain relief from these procedures.  We talked with patient in great detail today about plan of care and the next steps of his treatment.  He does verbalize that he would like a referral for surgical consultation.  We feel that he would be a good candidate for a lumbar decompression.  We did place referral today for surgical consultation with Dr. Sherley Bounds at [facility].  Patient encouraged to remain active and continue home exercise regimen as tolerated.  No red flag symptoms noted upon exam today.  Meds & Orders: No orders of the defined types were placed in this encounter.   Orders Placed This Encounter  Procedures   Ambulatory referral to Neurosurgery    Follow-up: Return if symptoms worsen or fail to improve.   Procedures: No procedures performed      Clinical History: MRI LUMBAR SPINE WITHOUT CONTRAST   TECHNIQUE: Multiplanar,  multisequence MR imaging of the lumbar spine was performed. No intravenous contrast was administered.   COMPARISON:  Lumbar radiographs 02/25/2020 without report.   FINDINGS: Segmentation: 5 non rib-bearing lumbar vertebral bodies with small ribs at T12 on prior lumbar radiographs.   Alignment: Mild levocurvature of the lower lumbar spine. Slight retrolisthesis of L2 on L3.   Vertebrae: Vertebral body heights are maintained. No evidence of acute fracture, discitis/osteomyelitis, or suspicious bone lesion. Benign L1 vertebral venous malformation. Bulky anterior bridging osteophytes multiple levels.   Conus medullaris and cauda equina: Conus extends to the L2 level. Conus appears normal.   Paraspinal and other soft tissues: Unremarkable   Disc levels:   T12-L1: No significant disc protrusion, foraminal stenosis, or canal stenosis.   L1-L2: Mild disc bulging and bilateral facet hypertrophy without significant canal or foraminal stenosis.   L2-L3: Slight retrolisthesis of L2 on L3. Broad disc bulge and mild bilateral facet hypertrophy with ligamentum flavum thickening. Mild bilateral foraminal stenosis. No significant canal stenosis.   L3-L4: Broad disc bulge and moderate bilateral facet hypertrophy and ligamentum flavum thickening. Resulting mild bilateral foraminal stenosis. Mild canal stenosis.   L4-L5: Broad disc bulge with superimposed right subarticular/foraminal disc protrusion. Severe bilateral facet hypertrophy. Resulting moderate canal stenosis with severe right subarticular recess stenosis and suspected impingement of the descending right L5 nerve roots. Moderate right greater than left foraminal stenosis.   L5-S1: Small broad disc bulge and moderate to severe bilateral facet hypertrophy. Resulting mild bilateral foraminal stenosis. There is tapering of the canal at this level with prominent epidural fat.   IMPRESSION: 1. At L4-L5, moderate canal stenosis with  severe right subarticular recess stenosis and suspected impingement of the descending right L5 nerve roots. Moderate right greater than left foraminal stenosis at this level. 2. Mild canal stenosis at L3-L4 and multilevel mild foraminal stenosis as detailed above.     Electronically Signed   By: Margaretha Sheffield MD   On: 09/03/2020 13:45   He reports that he quit smoking about 36 years ago. His smoking use included cigarettes. He has a 4.00 pack-year smoking history. He has never used smokeless tobacco.  Recent Labs    05/27/20 0808 11/26/20 0804  HGBA1C 5.6 5.5  LABURIC 5.9 5.6    Objective:  VS:  HT:    WT:   BMI:     BP: (!) 166/73  HR:96bpm  TEMP: ( )  RESP:  Physical Exam Vitals and nursing note reviewed.  HENT:     Head: Normocephalic and atraumatic.     Right Ear: External ear normal.     Left Ear: External ear normal.     Nose: Nose normal.     Mouth/Throat:     Mouth: Mucous membranes are moist.  Eyes:     Extraocular Movements: Extraocular movements intact.  Cardiovascular:     Rate and Rhythm: Normal rate.     Pulses: Normal pulses.  Pulmonary:     Effort: Pulmonary effort is normal.  Abdominal:     General: Abdomen is flat. There is no distension.  Musculoskeletal:        General:  Tenderness present.     Cervical back: Normal range of motion.     Comments: Pt is slow to rise from seated position to standing. Good lumbar range of motion. Strong distal strength without clonus, no pain upon palpation of greater trochanters. Sensation intact bilaterally. Dysesthesias noted to right L5 dermatome. Ambulates with cane, gait unsteady.  Skin:    General: Skin is warm and dry.     Capillary Refill: Capillary refill takes less than 2 seconds.  Neurological:     Mental Status: He is alert.     Gait: Gait abnormal.    Ortho Exam  Imaging: No results found.  Past Medical/Family/Surgical/Social History: Medications & Allergies reviewed per EMR, new  medications updated. Patient Active Problem List   Diagnosis Date Noted   Cellulitis of right knee 05/29/2018   Benign essential hypertension 05/22/2018   Status post right knee replacement 05/16/18 05/22/2018   Constipation due to opioid therapy 05/22/2018   Adult BMI 32.0-32.9 kg/sq m 05/22/2018   Primary osteoarthritis of knee 05/16/2018   Aortic atherosclerosis ([place]) 10/19/2016   Status post THR (total hip replacement) 04/20/13 05/01/2013   Prediabetes 03/26/2013   Hyperlipidemia 03/26/2013   Microproteinuria 03/26/2013   Gout 03/26/2013   Back pain 09/05/2012   Prostate cancer ([place]) 04/20/2011   Past Medical History:  Diagnosis Date   Aortic atherosclerosis ([place]) 10/19/2016    Seen on chest x-ray July 2018   Arthritis    right knee and hip    Cancer ([place])    prostate cancer    Chronic kidney disease    prostate cancer    Diabetes mellitus without complication (HCC)    GERD (gastroesophageal reflux disease)    Gout    Gout    Hypercholesteremia    Hyperglycemia    Hyperlipidemia    Hypertension    Sleep apnea    Status post THR (total hip replacement) 04/20/13 05/01/2013   Right total hip 04/20/2013 Dr. Aline Brochure [employer] implant    Family History  Problem Relation Age of Onset   Diabetes Mother    Hypertension Mother    Hypertension Brother    Past Surgical History:  Procedure Laterality Date   COLONOSCOPY     COLONOSCOPY N/A 10/22/2013   Procedure: COLONOSCOPY;  Surgeon: Danie Binder, MD;  [facility];  Service: Endoscopy;  Laterality: N/A;  [place]     right inguinal hernia repair    NASAL SEPTUM SURGERY     OTHER SURGICAL HISTORY     deviated septum surgery    ROBOT ASSISTED LAPAROSCOPIC RADICAL PROSTATECTOMY  04/20/2011   Procedure: ROBOTIC ASSISTED LAPAROSCOPIC RADICAL PROSTATECTOMY;  Surgeon: Malka So, MD;  Location: [facility];  Service: Urology;  Laterality: N/A;  Bilateral lymph node dissection   TOTAL HIP ARTHROPLASTY Right  04/20/2013   Procedure: TOTAL HIP ARTHROPLASTY;  Surgeon: Carole Civil, MD;  Location: [facility];  Service: Orthopedics;  Laterality: Right;   TOTAL KNEE ARTHROPLASTY Right 05/16/2018   Procedure: TOTAL KNEE ARTHROPLASTY;  Surgeon: Carole Civil, MD;  Location: [facility];  Service: Orthopedics;  Laterality: Right;   Social History   Occupational History   Not on file  Tobacco Use   Smoking status: Former    Packs/day: 0.50    Years: 8.00    Pack years: 4.00    Types: Cigarettes    Quit date: 03/22/1984    Years since quitting: 36.8   Smokeless tobacco: Never  Vaping  Use   Vaping Use: Never used  Substance and Sexual Activity   Alcohol use: Yes    Comment: occasional    Drug use: No   Sexual activity: Yes    Birth control/protection: None

## 2021-01-27 ENCOUNTER — Other Ambulatory Visit: Payer: Self-pay | Admitting: Family Medicine

## 2021-02-13 ENCOUNTER — Other Ambulatory Visit: Payer: Self-pay | Admitting: Family Medicine

## 2021-02-17 DIAGNOSIS — I10 Essential (primary) hypertension: Secondary | ICD-10-CM | POA: Diagnosis not present

## 2021-02-17 DIAGNOSIS — Z6834 Body mass index (BMI) 34.0-34.9, adult: Secondary | ICD-10-CM | POA: Diagnosis not present

## 2021-02-17 DIAGNOSIS — M5416 Radiculopathy, lumbar region: Secondary | ICD-10-CM | POA: Diagnosis not present

## 2021-02-18 DIAGNOSIS — E7800 Pure hypercholesterolemia, unspecified: Secondary | ICD-10-CM | POA: Diagnosis not present

## 2021-02-18 DIAGNOSIS — I10 Essential (primary) hypertension: Secondary | ICD-10-CM | POA: Diagnosis not present

## 2021-02-18 DIAGNOSIS — H5200 Hypermetropia, unspecified eye: Secondary | ICD-10-CM | POA: Diagnosis not present

## 2021-02-26 ENCOUNTER — Telehealth: Payer: Self-pay | Admitting: Family Medicine

## 2021-02-26 MED ORDER — AMLODIPINE BESYLATE 5 MG PO TABS: ORAL_TABLET | ORAL | 0 refills | Status: DC

## 2021-02-26 NOTE — Telephone Encounter (Signed)
Patient is requesting refill on amlodipine 5 mg called into [facility] last filled 12/03/20 .

## 2021-02-26 NOTE — Telephone Encounter (Signed)
Prescription sent electronically to pharmacy  Left message to return call 

## 2021-02-27 NOTE — Telephone Encounter (Signed)
Patient notified

## 2021-02-28 ENCOUNTER — Other Ambulatory Visit: Payer: Self-pay | Admitting: Family Medicine

## 2021-03-02 NOTE — Progress Notes (Signed)
CARDIOLOGY CONSULT NOTE       Patient ID: [name] [name] MRN: [id number] DOB/AGE: 10/01/1950 70 y.o.  Admit date: (Not on file) Referring Physician: Wolfgang Phoenix Primary Physician: Kathyrn Drown, MD Primary Cardiologist: New Reason for Consultation: Edema/HTN  Active Problems:   * No active hospital problems. *   HPI:  70 y.o. referred by Dr Wolfgang Phoenix for peripheral edema and HTN.  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 He works part time at  [employer].  No real family left except cousin that lives with him and sister in law   ROS All other systems reviewed and negative except as noted above  Past Medical History:  Diagnosis Date   Aortic atherosclerosis ([place]) 10/19/2016    Seen on chest x-ray July 2018   Arthritis    right knee and hip    Cancer ([place])    prostate cancer    Chronic kidney disease    prostate cancer    Diabetes mellitus without complication (HCC)    GERD (gastroesophageal reflux disease)    Gout    Gout    Hypercholesteremia    Hyperglycemia    Hyperlipidemia    Hypertension    Sleep apnea    Status post THR (total hip replacement) 04/20/13 05/01/2013   Right total hip 04/20/2013 Dr. Lydia Guiles implant     Family History  Problem Relation Age of Onset   Diabetes Mother    Hypertension Mother    Hypertension Brother     Social History   Socioeconomic History   Marital  status: Single    Spouse name: Not on file   Number of children: Not on file   Years of education: Not on file   Highest education level: Not on file  Occupational History   Not on file  Tobacco Use   Smoking status: Former    Packs/day: 0.50    Years: 8.00    Pack years: 4.00    Types: Cigarettes    Quit date: 03/22/1984    Years since quitting: 36.9   Smokeless tobacco: Never  Vaping Use   Vaping Use: Never used  Substance and Sexual Activity   Alcohol use: Yes    Comment: occasional    Drug use: No   Sexual activity: Yes    Birth control/protection: None  Other Topics Concern   Not on file  Social History Narrative   Not on file   Social Determinants of Health   Financial Resource Strain: Low Risk    Difficulty of Paying Living Expenses: Not hard at all  Food Insecurity: No Food Insecurity   Worried About Charity fundraiser in the Last Year: Never true   [place] in the Last Year: Never true  Transportation Needs: No Transportation Needs   Lack of Transportation (Medical): No   Lack of Transportation (Non-Medical): No  Physical Activity: Insufficiently Active  Days of Exercise per Week: 2 days   Minutes of Exercise per Session: 10 min  Stress: No Stress Concern Present   Feeling of Stress : Not at all  Social Connections: Moderately Isolated   Frequency of Communication with Friends and Family: More than three times a week   Frequency of Social Gatherings with Friends and Family: More than three times a week   Attends Religious Services: Never   Marine scientist or Organizations: No   Attends Archivist Meetings: Never   Marital Status: Living with partner  Intimate Partner Violence: Not At Risk   Fear of Current or Ex-Partner: No   Emotionally Abused: No   Physically Abused: No   Sexually Abused: No    Past Surgical History:  Procedure Laterality Date   COLONOSCOPY     COLONOSCOPY N/A 10/22/2013   Procedure: COLONOSCOPY;  Surgeon:  Danie Binder, MD;  [facility];  Service: Endoscopy;  Laterality: N/A;  [place]     right inguinal hernia repair    NASAL SEPTUM SURGERY     OTHER SURGICAL HISTORY     deviated septum surgery    ROBOT ASSISTED LAPAROSCOPIC RADICAL PROSTATECTOMY  04/20/2011   Procedure: ROBOTIC ASSISTED LAPAROSCOPIC RADICAL PROSTATECTOMY;  Surgeon: Malka So, MD;  Location: [facility];  Service: Urology;  Laterality: N/A;  Bilateral lymph node dissection   TOTAL HIP ARTHROPLASTY Right 04/20/2013   Procedure: TOTAL HIP ARTHROPLASTY;  Surgeon: Carole Civil, MD;  Location: [facility];  Service: Orthopedics;  Laterality: Right;   TOTAL KNEE ARTHROPLASTY Right 05/16/2018   Procedure: TOTAL KNEE ARTHROPLASTY;  Surgeon: Carole Civil, MD;  Location: [facility];  Service: Orthopedics;  Laterality: Right;      Current Outpatient Medications:    allopurinol (ZYLOPRIM) 100 MG tablet, Take 1 tablet (100 mg total) by mouth daily., Disp: 90 tablet, Rfl: 1   AMBULATORY NON FORMULARY MEDICATION, Medication Name: Testosterone 4% Gel  Apply 3.15QM ( 3 clicks) topically daily as directed, Disp: 30 g, Rfl: 3   amLODipine (NORVASC) 5 MG tablet, TAKE 1 TABLET EVERY DAY, Disp: 90 tablet, Rfl: 0   aspirin 81 MG chewable tablet, Chew 81 mg by mouth daily. , Disp: , Rfl:    atorvastatin (LIPITOR) 40 MG tablet, TAKE 1 TABLET EVERY DAY, Disp: 90 tablet, Rfl: 0   B Complex-C (SUPER B COMPLEX PO), Take 1 tablet by mouth daily., Disp: , Rfl:    buPROPion (WELLBUTRIN XL) 150 MG 24 hr tablet, TAKE ONE TABLET BY MOUTH DAILY *STOP CITALOPRAM*, Disp: 90 tablet, Rfl: 1   gabapentin (NEURONTIN) 300 MG capsule, Take 1 capsule (300 mg total) by mouth 3 (three) times daily. (Patient taking differently: Take 300 mg by mouth as needed.), Disp: 60 capsule, Rfl: 0   meloxicam (MOBIC) 7.5 MG tablet, Take 1 tablet (7.5 mg total) by mouth daily., Disp: 90 tablet, Rfl: 5   Multiple Vitamin (MULTIVITAMIN) tablet, Take 1 tablet by  mouth daily., Disp: , Rfl:    potassium chloride (KLOR-CON M) 10 MEQ tablet, TAKE 1 TABLET EVERY DAY, Disp: 90 tablet, Rfl: 0   sildenafil (REVATIO) 20 MG tablet, 1-5 po prn, Disp: 20 tablet, Rfl: 11   Testosterone POWD, 4% testosterone cream apply 3 clicks topically 1 am, Disp: 60 g, Rfl: 5   BP (!) 156/90   Pulse 84   Ht 5\' 8"  (1.727 m)   Wt 206 lb (93.4 kg)   SpO2 97%  BMI 31.32 kg/m  Affect appropriate Healthy:  appears stated age 32: normal Neck supple with no adenopathy JVP normal no bruits no thyromegaly Lungs clear with no wheezing and good diaphragmatic motion Heart:  S1/S2 SEM murmur, no rub, gallop or click PMI normal Abdomen: benign post prostatectomy and right inguinal hernia repair  no bruit.  No HSM or HJR Distal pulses intact with no bruits Plus one bilateral edema Post right THR/TKR      Labs:   Lab Results  Component Value Date   WBC 6.7 07/13/2018   HGB 14.5 10/30/2020   HCT 42.1 10/30/2020   MCV 85 07/13/2018   PLT 307 07/13/2018   No results for input(s): NA, K, CL, CO2, BUN, CREATININE, CALCIUM, PROT, BILITOT, ALKPHOS, ALT, AST, GLUCOSE in the last 168 hours.  Invalid input(s): LABALBU No results found for: CKTOTAL, CKMB, CKMBINDEX, TROPONINI  Lab Results  Component Value Date   CHOL 191 11/26/2020   CHOL 178 05/27/2020   CHOL 164 10/25/2019   Lab Results  Component Value Date   HDL 55 11/26/2020   HDL 50 05/27/2020   HDL 46 10/25/2019   Lab Results  Component Value Date   LDLCALC 116 (H) 11/26/2020   LDLCALC 110 (H) 05/27/2020   LDLCALC 99 10/25/2019   Lab Results  Component Value Date   TRIG 113 11/26/2020   TRIG 97 05/27/2020   TRIG 102 10/25/2019   Lab Results  Component Value Date   CHOLHDL 3.5 11/26/2020   CHOLHDL 3.6 05/27/2020   CHOLHDL 3.6 10/25/2019   No results found for: LDLDIRECT    Radiology: No results found.  EKG: SR rate 85 normal 12/03/20    ASSESSMENT AND PLAN:   CAD:  risk with DM and  episodes of exertional diaphoresis Normal myovue in 2020 We will have him come back after his lumbar surgery and decide on cardiac CTA vs myovue I will try to do TTE to make sure his EF is normal this week before surgery  HTN:  Well controlled.  Continue current medications and low sodium Dash type diet.   HLD:  on statin LDL 116  DM:  Discussed low carb diet.  Target hemoglobin A1c is 6.5 or less.  Continue current medications. Gout:  continue allopurinol avoid diuretics if possible    Echo for dyspnea/fatigue F/U 4-6 weeks post lumbar surgery   Signed: Jenkins Rouge 03/09/2021, 2:21 PM

## 2021-03-09 ENCOUNTER — Ambulatory Visit: Payer: Medicare HMO | Admitting: Cardiovascular Disease

## 2021-03-09 ENCOUNTER — Encounter: Payer: Self-pay | Admitting: Cardiovascular Disease

## 2021-03-09 ENCOUNTER — Other Ambulatory Visit: Payer: Self-pay

## 2021-03-09 VITALS — BP 156/90 | HR 84 | Ht 68.0 in | Wt 206.0 lb

## 2021-03-09 DIAGNOSIS — I10 Essential (primary) hypertension: Secondary | ICD-10-CM | POA: Diagnosis not present

## 2021-03-09 DIAGNOSIS — E782 Mixed hyperlipidemia: Secondary | ICD-10-CM

## 2021-03-09 DIAGNOSIS — R0609 Other forms of dyspnea: Secondary | ICD-10-CM

## 2021-03-09 NOTE — Patient Instructions (Signed)
Medication Instructions:  Your physician recommends that you continue on your current medications as directed. Please refer to the Current Medication list given to you today.  *If you need a refill on your cardiac medications before your next appointment, please call your pharmacy*   Lab Work: NONE   If you have labs (blood work) drawn today and your tests are completely normal, you will receive your results only by: [place] (if you have MyChart) OR A paper copy in the mail If you have any lab test that is abnormal or we need to change your treatment, we will call you to review the results.   Testing/Procedures: Your physician has requested that you have an echocardiogram. Echocardiography is a painless test that uses sound waves to create images of your heart. It provides your doctor with information about the size and shape of your heart and how well your hearts chambers and valves are working. This procedure takes approximately one hour. There are no restrictions for this procedure.    Follow-Up: At [facility], you and your health needs are our priority.  As part of our continuing mission to provide you with exceptional heart care, we have created designated Provider Care Teams.  These Care Teams include your primary Cardiologist (physician) and Advanced Practice Providers (APPs -  Physician Assistants and Nurse Practitioners) who all work together to provide you with the care you need, when you need it.  We recommend signing up for the patient portal called "MyChart".  Sign up information is provided on this After Visit Summary.  MyChart is used to connect with patients for Virtual Visits (Telemedicine).  Patients are able to view lab/test results, encounter notes, upcoming appointments, etc.  Non-urgent messages can be sent to your provider as well.   To learn more about what you can do with MyChart, go to [email or web address].    Your next appointment:   4 -6  week(s)  The format for your next appointment:   In Person  Provider:   Jenkins Rouge, MD, Bernerd Pho, PA-C, or Ermalinda Barrios, PA-C    Other Instructions Thank you for choosing [facility]!

## 2021-03-10 ENCOUNTER — Ambulatory Visit (HOSPITAL_COMMUNITY)
Admission: RE | Admit: 2021-03-10 | Discharge: 2021-03-10 | Disposition: A | Discharge status: Home / Self Care | Payer: Medicare HMO | Source: Ambulatory Visit | Attending: Cardiovascular Disease | Admitting: Cardiovascular Disease

## 2021-03-10 DIAGNOSIS — I10 Essential (primary) hypertension: Secondary | ICD-10-CM

## 2021-03-10 LAB — ECHOCARDIOGRAM COMPLETE
Area-P 1/2: 3.12 cm2
S' Lateral: 2.3 cm

## 2021-03-10 NOTE — Progress Notes (Signed)
*  PRELIMINARY RESULTS* Echocardiogram 2D Echocardiogram has been performed.  Samuel Germany 03/10/2021, 12:30 PM

## 2021-03-13 ENCOUNTER — Other Ambulatory Visit: Payer: Self-pay | Admitting: Neurological Surgery

## 2021-03-16 ENCOUNTER — Ambulatory Visit (HOSPITAL_COMMUNITY): Payer: Medicare HMO

## 2021-03-16 ENCOUNTER — Ambulatory Visit (HOSPITAL_COMMUNITY): Payer: Medicare HMO | Admitting: Anesthesiology

## 2021-03-16 ENCOUNTER — Other Ambulatory Visit: Payer: Self-pay

## 2021-03-16 ENCOUNTER — Encounter (HOSPITAL_COMMUNITY)
Admission: RE | Disposition: A | Discharge status: Home / Self Care | Payer: Self-pay | Source: Home / Self Care | Attending: Neurological Surgery

## 2021-03-16 ENCOUNTER — Encounter (HOSPITAL_COMMUNITY): Payer: Self-pay | Admitting: Neurological Surgery

## 2021-03-16 ENCOUNTER — Ambulatory Visit (HOSPITAL_COMMUNITY)
Admission: RE | Admit: 2021-03-16 | Discharge: 2021-03-16 | Disposition: A | Discharge status: Home / Self Care | Payer: Medicare HMO | Attending: Neurological Surgery | Admitting: Neurological Surgery

## 2021-03-16 ENCOUNTER — Other Ambulatory Visit (HOSPITAL_COMMUNITY): Payer: Self-pay | Admitting: Neurological Surgery

## 2021-03-16 DIAGNOSIS — Z87891 Personal history of nicotine dependence: Secondary | ICD-10-CM | POA: Diagnosis not present

## 2021-03-16 DIAGNOSIS — Z79899 Other long term (current) drug therapy: Secondary | ICD-10-CM | POA: Insufficient documentation

## 2021-03-16 DIAGNOSIS — E1122 Type 2 diabetes mellitus with diabetic chronic kidney disease: Secondary | ICD-10-CM | POA: Diagnosis not present

## 2021-03-16 DIAGNOSIS — M109 Gout, unspecified: Secondary | ICD-10-CM | POA: Diagnosis not present

## 2021-03-16 DIAGNOSIS — K219 Gastro-esophageal reflux disease without esophagitis: Secondary | ICD-10-CM | POA: Diagnosis not present

## 2021-03-16 DIAGNOSIS — M545 Low back pain, unspecified: Secondary | ICD-10-CM | POA: Diagnosis not present

## 2021-03-16 DIAGNOSIS — I129 Hypertensive chronic kidney disease with stage 1 through stage 4 chronic kidney disease, or unspecified chronic kidney disease: Secondary | ICD-10-CM | POA: Insufficient documentation

## 2021-03-16 DIAGNOSIS — M48061 Spinal stenosis, lumbar region without neurogenic claudication: Secondary | ICD-10-CM | POA: Diagnosis not present

## 2021-03-16 DIAGNOSIS — Z8546 Personal history of malignant neoplasm of prostate: Secondary | ICD-10-CM | POA: Diagnosis not present

## 2021-03-16 DIAGNOSIS — M5416 Radiculopathy, lumbar region: Secondary | ICD-10-CM | POA: Insufficient documentation

## 2021-03-16 DIAGNOSIS — Z6831 Body mass index (BMI) 31.0-31.9, adult: Secondary | ICD-10-CM | POA: Insufficient documentation

## 2021-03-16 DIAGNOSIS — E785 Hyperlipidemia, unspecified: Secondary | ICD-10-CM | POA: Diagnosis not present

## 2021-03-16 DIAGNOSIS — G4730 Sleep apnea, unspecified: Secondary | ICD-10-CM | POA: Insufficient documentation

## 2021-03-16 DIAGNOSIS — E7800 Pure hypercholesterolemia, unspecified: Secondary | ICD-10-CM | POA: Insufficient documentation

## 2021-03-16 DIAGNOSIS — N189 Chronic kidney disease, unspecified: Secondary | ICD-10-CM | POA: Insufficient documentation

## 2021-03-16 DIAGNOSIS — E669 Obesity, unspecified: Secondary | ICD-10-CM | POA: Diagnosis not present

## 2021-03-16 DIAGNOSIS — Z419 Encounter for procedure for purposes other than remedying health state, unspecified: Secondary | ICD-10-CM

## 2021-03-16 HISTORY — PX: LUMBAR LAMINECTOMY/DECOMPRESSION MICRODISCECTOMY: SHX5026

## 2021-03-16 LAB — SURGICAL PCR SCREEN

## 2021-03-16 SURGERY — LUMBAR LAMINECTOMY/DECOMPRESSION MICRODISCECTOMY 1 LEVEL
Anesthesia: General | Site: Back | Laterality: Right

## 2021-03-16 MED ORDER — ONDANSETRON HCL 4 MG/2ML IJ SOLN
INTRAMUSCULAR | Status: AC
  Filled 2021-03-16: qty 2

## 2021-03-16 MED ORDER — FENTANYL CITRATE (PF) 250 MCG/5ML IJ SOLN
INTRAMUSCULAR | Status: AC
  Filled 2021-03-16: qty 5

## 2021-03-16 MED ORDER — DEXAMETHASONE SODIUM PHOSPHATE 10 MG/ML IJ SOLN
INTRAMUSCULAR | Status: DC | PRN
  Administered 2021-03-16: 5 mg via INTRAVENOUS

## 2021-03-16 MED ORDER — CHLORHEXIDINE GLUCONATE CLOTH 2 % EX PADS: 6.0000 | MEDICATED_PAD | Freq: Once | CUTANEOUS | Status: DC

## 2021-03-16 MED ORDER — PHENYLEPHRINE 40 MCG/ML (10ML) SYRINGE FOR IV PUSH (FOR BLOOD PRESSURE SUPPORT)
PREFILLED_SYRINGE | INTRAVENOUS | Status: AC
  Filled 2021-03-16: qty 10

## 2021-03-16 MED ORDER — LIDOCAINE 2% (20 MG/ML) 5 ML SYRINGE
INTRAMUSCULAR | Status: DC | PRN
  Administered 2021-03-16: 80 mg via INTRAVENOUS

## 2021-03-16 MED ORDER — LACTATED RINGERS IV SOLN: INTRAVENOUS | Status: DC

## 2021-03-16 MED ORDER — CHLORHEXIDINE GLUCONATE 0.12 % MT SOLN: 15.0000 mL | Freq: Once | OROMUCOSAL | Status: AC

## 2021-03-16 MED ORDER — ROCURONIUM BROMIDE 10 MG/ML (PF) SYRINGE
PREFILLED_SYRINGE | INTRAVENOUS | Status: DC | PRN
  Administered 2021-03-16: 50 mg via INTRAVENOUS

## 2021-03-16 MED ORDER — DEXAMETHASONE SODIUM PHOSPHATE 10 MG/ML IJ SOLN
INTRAMUSCULAR | Status: AC
  Filled 2021-03-16: qty 1

## 2021-03-16 MED ORDER — BUPIVACAINE HCL (PF) 0.25 % IJ SOLN
INTRAMUSCULAR | Status: AC
  Filled 2021-03-16: qty 30

## 2021-03-16 MED ORDER — THROMBIN 5000 UNITS EX SOLR
OROMUCOSAL | Status: DC | PRN
  Administered 2021-03-16: 09:00:00 5 mL via TOPICAL

## 2021-03-16 MED ORDER — ACETAMINOPHEN 10 MG/ML IV SOLN
INTRAVENOUS | Status: AC
  Filled 2021-03-16: qty 100

## 2021-03-16 MED ORDER — ACETAMINOPHEN 10 MG/ML IV SOLN
INTRAVENOUS | Status: DC | PRN
  Administered 2021-03-16: 1000 mg via INTRAVENOUS

## 2021-03-16 MED ORDER — 0.9 % SODIUM CHLORIDE (POUR BTL) OPTIME
TOPICAL | Status: DC | PRN
  Administered 2021-03-16: 09:00:00 1000 mL

## 2021-03-16 MED ORDER — THROMBIN 5000 UNITS EX SOLR
CUTANEOUS | Status: AC
  Filled 2021-03-16: qty 5000

## 2021-03-16 MED ORDER — LIDOCAINE 2% (20 MG/ML) 5 ML SYRINGE
INTRAMUSCULAR | Status: AC
  Filled 2021-03-16: qty 5

## 2021-03-16 MED ORDER — FENTANYL CITRATE (PF) 100 MCG/2ML IJ SOLN: 25.0000 ug | INTRAMUSCULAR | Status: DC | PRN

## 2021-03-16 MED ORDER — PROPOFOL 10 MG/ML IV BOLUS
INTRAVENOUS | Status: DC | PRN
  Administered 2021-03-16: 130 mg via INTRAVENOUS

## 2021-03-16 MED ORDER — HYDROCODONE-ACETAMINOPHEN 5-325 MG PO TABS: 1.0000 | ORAL_TABLET | ORAL | 0 refills | Status: DC | PRN

## 2021-03-16 MED ORDER — PHENYLEPHRINE HCL-NACL 20-0.9 MG/250ML-% IV SOLN
INTRAVENOUS | Status: DC | PRN
  Administered 2021-03-16: 30 ug/min via INTRAVENOUS

## 2021-03-16 MED ORDER — ONDANSETRON HCL 4 MG/2ML IJ SOLN: 4.0000 mg | Freq: Once | INTRAMUSCULAR | Status: DC | PRN

## 2021-03-16 MED ORDER — LACTATED RINGERS IV SOLN: INTRAVENOUS | Status: DC | PRN

## 2021-03-16 MED ORDER — CEFAZOLIN SODIUM-DEXTROSE 2-4 GM/100ML-% IV SOLN
INTRAVENOUS | Status: AC
  Filled 2021-03-16: qty 100

## 2021-03-16 MED ORDER — ROCURONIUM BROMIDE 10 MG/ML (PF) SYRINGE
PREFILLED_SYRINGE | INTRAVENOUS | Status: AC
  Filled 2021-03-16: qty 10

## 2021-03-16 MED ORDER — FENTANYL CITRATE (PF) 250 MCG/5ML IJ SOLN
INTRAMUSCULAR | Status: DC | PRN
  Administered 2021-03-16: 50 ug via INTRAVENOUS
  Administered 2021-03-16: 25 ug via INTRAVENOUS
  Administered 2021-03-16: 50 ug via INTRAVENOUS

## 2021-03-16 MED ORDER — CHLORHEXIDINE GLUCONATE 0.12 % MT SOLN
OROMUCOSAL | Status: AC
  Administered 2021-03-16: 09:00:00 15 mL via OROMUCOSAL
  Filled 2021-03-16: qty 15

## 2021-03-16 MED ORDER — SUGAMMADEX SODIUM 200 MG/2ML IV SOLN
INTRAVENOUS | Status: DC | PRN
  Administered 2021-03-16: 200 mg via INTRAVENOUS

## 2021-03-16 MED ORDER — BUPIVACAINE HCL (PF) 0.25 % IJ SOLN
INTRAMUSCULAR | Status: DC | PRN
  Administered 2021-03-16: 5 mL
  Administered 2021-03-16: 10 mL

## 2021-03-16 MED ORDER — PROPOFOL 10 MG/ML IV BOLUS
INTRAVENOUS | Status: AC
  Filled 2021-03-16: qty 20

## 2021-03-16 MED ORDER — ORAL CARE MOUTH RINSE: 15.0000 mL | Freq: Once | OROMUCOSAL | Status: AC

## 2021-03-16 MED ORDER — ONDANSETRON HCL 4 MG/2ML IJ SOLN
INTRAMUSCULAR | Status: DC | PRN
  Administered 2021-03-16: 4 mg via INTRAVENOUS

## 2021-03-16 MED ORDER — CEFAZOLIN SODIUM-DEXTROSE 2-4 GM/100ML-% IV SOLN
2.0000 g | INTRAVENOUS | Status: AC
  Administered 2021-03-16: 09:00:00 2 g via INTRAVENOUS

## 2021-03-16 SURGICAL SUPPLY — 41 items
BAG COUNTER SPONGE SURGICOUNT (BAG) ×2 IMPLANT
BAG SURGICOUNT SPONGE COUNTING (BAG) ×1
BAND RUBBER #18 3X1/16 STRL (MISCELLANEOUS) ×6 IMPLANT
BENZOIN TINCTURE PRP APPL 2/3 (GAUZE/BANDAGES/DRESSINGS) ×3 IMPLANT
BUR CARBIDE MATCH 3.0 (BURR) ×3 IMPLANT
CANISTER SUCT 3000ML PPV (MISCELLANEOUS) ×3 IMPLANT
CLOSURE WOUND 1/2 X4 (GAUZE/BANDAGES/DRESSINGS) ×1
DRAPE LAPAROTOMY 100X72X124 (DRAPES) ×3 IMPLANT
DRAPE MICROSCOPE LEICA (MISCELLANEOUS) ×3 IMPLANT
DRAPE SURG 17X23 STRL (DRAPES) ×3 IMPLANT
DRSG OPSITE 4X5.5 SM (GAUZE/BANDAGES/DRESSINGS) ×2 IMPLANT
DURAPREP 26ML APPLICATOR (WOUND CARE) ×3 IMPLANT
ELECT REM PT RETURN 9FT ADLT (ELECTROSURGICAL) ×3
ELECTRODE REM PT RTRN 9FT ADLT (ELECTROSURGICAL) ×1 IMPLANT
GAUZE 4X4 16PLY ~~LOC~~+RFID DBL (SPONGE) ×2 IMPLANT
GLOVE SURG ENC MOIS LTX SZ7 (GLOVE) ×2 IMPLANT
GLOVE SURG ENC MOIS LTX SZ8 (GLOVE) ×3 IMPLANT
GLOVE SURG UNDER POLY LF SZ7 (GLOVE) ×2 IMPLANT
GOWN STRL REUS W/ TWL LRG LVL3 (GOWN DISPOSABLE) IMPLANT
GOWN STRL REUS W/ TWL XL LVL3 (GOWN DISPOSABLE) ×1 IMPLANT
GOWN STRL REUS W/TWL 2XL LVL3 (GOWN DISPOSABLE) IMPLANT
GOWN STRL REUS W/TWL LRG LVL3 (GOWN DISPOSABLE) ×4
GOWN STRL REUS W/TWL XL LVL3 (GOWN DISPOSABLE) ×2
HEMOSTAT POWDER KIT SURGIFOAM (HEMOSTASIS) ×3 IMPLANT
KIT BASIN OR (CUSTOM PROCEDURE TRAY) ×3 IMPLANT
KIT TURNOVER KIT B (KITS) ×3 IMPLANT
NDL HYPO 25X1 1.5 SAFETY (NEEDLE) ×1 IMPLANT
NDL SPNL 20GX3.5 QUINCKE YW (NEEDLE) IMPLANT
NEEDLE HYPO 25X1 1.5 SAFETY (NEEDLE) ×3 IMPLANT
NEEDLE SPNL 20GX3.5 QUINCKE YW (NEEDLE) IMPLANT
NS IRRIG 1000ML POUR BTL (IV SOLUTION) ×3 IMPLANT
PACK LAMINECTOMY NEURO (CUSTOM PROCEDURE TRAY) ×3 IMPLANT
PAD ARMBOARD 7.5X6 YLW CONV (MISCELLANEOUS) ×9 IMPLANT
STRIP CLOSURE SKIN 1/2X4 (GAUZE/BANDAGES/DRESSINGS) ×2 IMPLANT
SUT VIC AB 0 CT1 18XCR BRD8 (SUTURE) ×1 IMPLANT
SUT VIC AB 0 CT1 8-18 (SUTURE) ×2
SUT VIC AB 2-0 CP2 18 (SUTURE) ×3 IMPLANT
SUT VIC AB 3-0 SH 8-18 (SUTURE) ×3 IMPLANT
TOWEL GREEN STERILE (TOWEL DISPOSABLE) ×3 IMPLANT
TOWEL GREEN STERILE FF (TOWEL DISPOSABLE) ×3 IMPLANT
WATER STERILE IRR 1000ML POUR (IV SOLUTION) ×3 IMPLANT

## 2021-03-16 NOTE — Anesthesia Preprocedure Evaluation (Addendum)
Anesthesia Evaluation  Patient identified by MRN, date of birth, ID band Patient awake    Reviewed: Allergy & Precautions, NPO status , Patient's Chart, lab work & pertinent test results  Airway Mallampati: III  TM Distance: >3 FB Neck ROM: Full    Dental  (+) Teeth Intact, Dental Advisory Given   Pulmonary sleep apnea , former smoker,    Pulmonary exam normal breath sounds clear to auscultation       Cardiovascular hypertension, Pt. on medications Normal cardiovascular exam Rhythm:Regular Rate:Normal     Neuro/Psych Radiculopathy, Lumbar region negative psych ROS   GI/Hepatic Neg liver ROS, GERD  ,  Endo/Other  negative endocrine ROSdiabetesObesity   Renal/GU Renal InsufficiencyRenal disease     Musculoskeletal  (+) Arthritis ,   Abdominal   Peds  Hematology negative hematology ROS (+)   Anesthesia Other Findings Day of surgery medications reviewed with the patient.  Reproductive/Obstetrics                            Anesthesia Physical Anesthesia Plan  ASA: 3  Anesthesia Plan: General   Post-op Pain Management:    Induction: Intravenous  PONV Risk Score and Plan: 2 and Dexamethasone and Ondansetron  Airway Management Planned: Oral ETT  Additional Equipment:   Intra-op Plan:   Post-operative Plan: Extubation in OR  Informed Consent: I have reviewed the patients History and Physical, chart, labs and discussed the procedure including the risks, benefits and alternatives for the proposed anesthesia with the patient or authorized representative who has indicated his/her understanding and acceptance.     Dental advisory given  Plan Discussed with: CRNA  Anesthesia Plan Comments:         Anesthesia Quick Evaluation

## 2021-03-16 NOTE — H&P (Signed)
Subjective: Patient is a 70 y.o. male admitted for spinal stenosis. Onset of symptoms was several months ago, gradually worsening since that time.  The pain is rated severe, and is located at the across the lower back and radiates to RLE. The pain is described as aching and occurs all day. The symptoms have been progressive. Symptoms are exacerbated by exercise, standing, and walking for more than a few minutes. MRI or CT showed stenosis L4-5 r   Past Medical History:  Diagnosis Date   Aortic atherosclerosis ([place]) 10/19/2016    Seen on chest x-ray July 2018   Arthritis    right knee and hip    Cancer [facility])    prostate cancer    Chronic kidney disease    prostate cancer    Diabetes mellitus without complication (HCC)    GERD (gastroesophageal reflux disease)    Gout    Gout    Hypercholesteremia    Hyperglycemia    Hyperlipidemia    Hypertension    Sleep apnea    Status post THR (total hip replacement) 04/20/13 05/01/2013   Right total hip 04/20/2013 Dr. Lydia Guiles implant     Past Surgical History:  Procedure Laterality Date   COLONOSCOPY     COLONOSCOPY N/A 10/22/2013   Procedure: COLONOSCOPY;  Surgeon: Danie Binder, MD;  [facility];  Service: Endoscopy;  Laterality: N/A;  [place]     right inguinal hernia repair    NASAL SEPTUM SURGERY     OTHER SURGICAL HISTORY     deviated septum surgery    ROBOT ASSISTED LAPAROSCOPIC RADICAL PROSTATECTOMY  04/20/2011   Procedure: ROBOTIC ASSISTED LAPAROSCOPIC RADICAL PROSTATECTOMY;  Surgeon: Malka So, MD;  Location: [facility];  Service: Urology;  Laterality: N/A;  Bilateral lymph node dissection   TOTAL HIP ARTHROPLASTY Right 04/20/2013   Procedure: TOTAL HIP ARTHROPLASTY;  Surgeon: Carole Civil, MD;  Location: [facility] OR[facility];  Service: Orthopedics;  Laterality: Right;   TOTAL KNEE ARTHROPLASTY Right 05/16/2018   Procedure: TOTAL KNEE ARTHROPLASTY;  Surgeon: Carole Civil, MD;  Location: [facility];  Service:  Orthopedics;  Laterality: Right;    Prior to Admission medications   Medication Sig Start Date End Date Taking? Authorizing Provider  allopurinol (ZYLOPRIM) 100 MG tablet Take 1 tablet (100 mg total) by mouth daily. 12/03/20   Kathyrn Drown, MD  AMBULATORY NON FORMULARY MEDICATION Medication Name: Testosterone 4% Gel  Apply 0.10UV ( 3 clicks) topically daily as directed 05/09/20   Irine Seal, MD  amLODipine (NORVASC) 5 MG tablet TAKE 1 TABLET EVERY DAY 02/26/21   Kathyrn Drown, MD  aspirin 81 MG chewable tablet Chew 81 mg by mouth daily.     [provider]  atorvastatin (LIPITOR) 40 MG tablet TAKE 1 TABLET EVERY DAY 03/02/21   Kathyrn Drown, MD  B Complex-C (SUPER B COMPLEX PO) Take 1 tablet by mouth daily.    [provider]  buPROPion (WELLBUTRIN XL) 150 MG 24 hr tablet TAKE ONE TABLET BY MOUTH DAILY *STOP CITALOPRAM* 01/29/21   Kathyrn Drown, MD  gabapentin (NEURONTIN) 300 MG capsule Take 1 capsule (300 mg total) by mouth 3 (three) times daily. Patient taking differently: Take 300 mg by mouth as needed. 01/01/21   Carole Civil, MD  meloxicam (MOBIC) 7.5 MG tablet Take 1 tablet (7.5 mg total) by mouth daily. 12/30/20 03/30/21  Carole Civil, MD  Multiple Vitamin (MULTIVITAMIN) tablet Take 1 tablet  by mouth daily.    [provider]  potassium chloride (KLOR-CON M) 10 MEQ tablet TAKE 1 TABLET EVERY DAY 03/02/21   Kathyrn Drown, MD  sildenafil (REVATIO) 20 MG tablet 1-5 po prn 11/06/20   Irine Seal, MD  Testosterone POWD 4% testosterone cream apply 3 clicks topically 1 am 11/06/20   Irine Seal, MD   Allergies  Allergen Reactions   Citalopram     Fatigue headache    Lipitor [Atorvastatin] Other (See Comments)    Myalgia    Lisinopril Cough    Dry cough    Social History   Tobacco Use   Smoking status: Former    Packs/day: 0.50    Years: 8.00    Pack years: 4.00    Types: Cigarettes    Quit date: 03/22/1984    Years since quitting:  37.0   Smokeless tobacco: Never  Substance Use Topics   Alcohol use: Yes    Comment: occasional     Family History  Problem Relation Age of Onset   Diabetes Mother    Hypertension Mother    Hypertension Brother      Review of Systems  Positive ROS: neg  All other systems have been reviewed and were otherwise negative with the exception of those mentioned in the HPI and as above.  Objective: Vital signs in last 24 hours:    General Appearance: Alert, cooperative, no distress, appears stated age Head: Normocephalic, without obvious abnormality, atraumatic Eyes: PERRL, conjunctiva/corneas clear, EOM's intact    Neck: Supple, symmetrical, trachea midline Back: Symmetric, no curvature, ROM normal, no CVA tenderness Lungs:  respirations unlabored Heart: Regular rate and rhythm Abdomen: Soft, non-tender Extremities: Extremities normal, atraumatic, no cyanosis or edema Pulses: 2+ and symmetric all extremities Skin: Skin color, texture, turgor normal, no rashes or lesions  NEUROLOGIC:   Mental status: Alert and oriented x4,  no aphasia, good attention span, fund of knowledge, and memory Motor Exam - grossly normal Sensory Exam - grossly normal Reflexes: 1= Coordination - grossly normal Gait - grossly normal Balance - grossly normal Cranial Nerves: I: smell Not tested  II: visual acuity  OS: nl    OD: nl  II: visual fields Full to confrontation  II: pupils Equal, round, reactive to light  III,VII: ptosis None  III,IV,VI: extraocular muscles  Full ROM  V: mastication Normal  V: facial light touch sensation  Normal  V,VII: corneal reflex  Present  VII: facial muscle function - upper  Normal  VII: facial muscle function - lower Normal  VIII: hearing Not tested  IX: soft palate elevation  Normal  IX,X: gag reflex Present  XI: trapezius strength  5/5  XI: sternocleidomastoid strength 5/5  XI: neck flexion strength  5/5  XII: tongue strength  Normal    Data  Review Lab Results  Component Value Date   WBC 6.7 07/13/2018   HGB 14.5 10/30/2020   HCT 42.1 10/30/2020   MCV 85 07/13/2018   PLT 307 07/13/2018   Lab Results  Component Value Date   NA 138 11/26/2020   K 4.8 11/26/2020   CL 94 (L) 11/26/2020   CO2 25 11/26/2020   BUN 11 11/26/2020   CREATININE 1.14 11/26/2020   GLUCOSE 120 (H) 11/26/2020   No results found for: INR, PROTIME  Assessment/Plan:  Estimated body mass index is 31.32 kg/m as calculated from the following:   Height as of 03/09/21: 5\' 8"  (1.727 m).   Weight as of 03/09/21: 93.4  kg. Patient admitted for R L4-5 decompressive lami. Patient has failed a reasonable attempt at conservative therapy.  I explained the condition and procedure to the patient and answered any questions.  Patient wishes to proceed with procedure as planned. Understands risks/ benefits and typical outcomes of procedure.   Eustace Moore 03/16/2021 8:03 AM

## 2021-03-16 NOTE — Transfer of Care (Signed)
Immediate Anesthesia Transfer of Care Note  Patient: [name] [name]  Procedure(s) Performed: Right Lumbar four-five Laminectomy/foraminotomy (Right: Back)  Patient Location: PACU  Anesthesia Type:General  Level of Consciousness: drowsy and patient cooperative  Airway & Oxygen Therapy: Patient Spontanous Breathing and Patient connected to face mask oxygen  Post-op Assessment: Report given to RN and Post -op Vital signs reviewed and stable  Post vital signs: Reviewed and stable  Last Vitals:  Vitals Value Taken Time  BP 162/89 03/16/21 1007  Temp    Pulse 93 03/16/21 1008  Resp 28 03/16/21 1008  SpO2 100 % 03/16/21 1008  Vitals shown include unvalidated device data.  Last Pain:  Vitals:   03/16/21 0819  PainSc: 0-No pain      Patients Stated Pain Goal: 2 (63/84/53 6468)  Complications: No notable events documented.

## 2021-03-16 NOTE — Anesthesia Procedure Notes (Addendum)
Procedure Name: Intubation Date/Time: 03/16/2021 8:47 AM Performed by: Catalina Gravel, MD Pre-anesthesia Checklist: Patient identified, Emergency Drugs available, Suction available and Patient being monitored Patient Re-evaluated:Patient Re-evaluated prior to induction Oxygen Delivery Method: Circle system utilized Preoxygenation: Pre-oxygenation with 100% oxygen Induction Type: IV induction Ventilation: Mask ventilation without difficulty Laryngoscope Size: Mac and 4 Grade View: Grade IV Tube type: Oral Tube size: 7.5 mm Number of attempts: 1 Airway Equipment and Method: Stylet and Oral airway Placement Confirmation: ETT inserted through vocal cords under direct vision, positive ETCO2 and breath sounds checked- equal and bilateral Secured at: 23 cm Tube secured with: Tape Dental Injury: Teeth and Oropharynx as per pre-operative assessment

## 2021-03-16 NOTE — Op Note (Signed)
03/16/2021  10:02 AM  PATIENT:  [name] [name]  70 y.o. male  PRE-OPERATIVE DIAGNOSIS: Lumbar spinal stenosis L4-5 right with right L5 radiculopathy  POST-OPERATIVE DIAGNOSIS:  same  PROCEDURE: Right L4-5 hemilaminectomy medial facetectomy and foraminotomies with decompression of the right L5 nerve root  SURGEON:  Sherley Bounds, MD  ASSISTANTS: Glenford Peers FNP  ANESTHESIA:   General  EBL: Less than 25 ml  Total I/O In: 600 [I.V.:600] Out: -   BLOOD ADMINISTERED: none  DRAINS: None  SPECIMEN:  none  INDICATION FOR PROCEDURE: This patient presented with severe right leg pain in an L5 distribution. Imaging showed severe subarticular recess stenosis L4-5 on the right with right L5 nerve root compression. The patient tried conservative measures without relief. Pain was debilitating. Recommended decompressive laminectomy L4-5 right. Patient understood the risks, benefits, and alternatives and potential outcomes and wished to proceed.  PROCEDURE DETAILS: The patient was taken to the operating room and after induction of adequate generalized endotracheal anesthesia, the patient was rolled into the prone position on the Wilson frame and all pressure points were padded. The lumbar region was cleaned and then prepped with DuraPrep and draped in the usual sterile fashion. 5 cc of local anesthesia was injected and then a dorsal midline incision was made and carried down to the lumbo sacral fascia. The fascia was opened and the paraspinous musculature was taken down in a subperiosteal fashion to expose L4-5 on the right. Intraoperative x-ray confirmed my level, and then I used a combination of the high-speed drill and the Kerrison punches to perform a hemilaminectomy, medial facetectomy, and foraminotomy at L4-5 on the right. The underlying yellow ligament was opened and removed in a piecemeal fashion to expose the underlying dura and exiting nerve root. I undercut the lateral recess and  dissected down until I was medial to and distal to the pedicle. The nerve root was well decompressed.  I then palpated with a coronary dilator along the nerve root and into the foramen to assure adequate decompression. I felt no more compression of the nerve root. I irrigated with saline solution containing bacitracin. Achieved hemostasis with bipolar cautery, lined the dura with Gelfoam, and then closed the fascia with 0 Vicryl. I closed the subcutaneous tissues with 2-0 Vicryl and the subcuticular tissues with 3-0 Vicryl. The skin was then closed with benzoin and Steri-Strips. The drapes were removed, a sterile dressing was applied.  My nurse practitioner was involved in the exposure, safe retraction of the neural elements, and the closure. the patient was awakened from general anesthesia and transferred to the recovery room in stable condition. At the end of the procedure all sponge, needle and instrument counts were correct.    PLAN OF CARE: Discharge to home after PACU  PATIENT DISPOSITION:  PACU - hemodynamically stable.   Delay start of Pharmacological VTE agent (>24hrs) due to surgical blood loss or risk of bleeding:  yes

## 2021-03-16 NOTE — Anesthesia Postprocedure Evaluation (Signed)
Anesthesia Post Note  Patient: [name] [name]  Procedure(s) Performed: Right Lumbar four-five Laminectomy/foraminotomy (Right: Back)     Patient location during evaluation: PACU Anesthesia Type: General Level of consciousness: awake and alert Pain management: pain level controlled Vital Signs Assessment: post-procedure vital signs reviewed and stable Respiratory status: spontaneous breathing, nonlabored ventilation and respiratory function stable Cardiovascular status: blood pressure returned to baseline and stable Postop Assessment: no apparent nausea or vomiting Anesthetic complications: no   No notable events documented.  Last Vitals:  Vitals:   03/16/21 1052 03/16/21 1107  BP: (!) 158/87 140/82  Pulse: 79 77  Resp: 16 15  Temp:  36.7 C  SpO2: 96% 95%    Last Pain:  Vitals:   03/16/21 1107  PainSc: 0-No pain                 Catalina Gravel

## 2021-03-17 ENCOUNTER — Encounter (HOSPITAL_COMMUNITY): Payer: Self-pay | Admitting: Neurological Surgery

## 2021-03-18 ENCOUNTER — Other Ambulatory Visit: Payer: Self-pay | Admitting: Family Medicine

## 2021-03-30 ENCOUNTER — Other Ambulatory Visit: Payer: Self-pay | Admitting: Family Medicine

## 2021-03-31 ENCOUNTER — Other Ambulatory Visit: Payer: Self-pay | Admitting: Radiology

## 2021-03-31 NOTE — Telephone Encounter (Signed)
Refill request received via fax for Meloxicam to [facility] mail order

## 2021-04-01 MED ORDER — POTASSIUM CHLORIDE CRYS ER 10 MEQ PO TBCR: 10.0000 meq | EXTENDED_RELEASE_TABLET | Freq: Every day | ORAL | 1 refills | Status: DC

## 2021-04-01 MED ORDER — MELOXICAM 7.5 MG PO TABS: 7.5000 mg | ORAL_TABLET | Freq: Every day | ORAL | 1 refills | Status: DC

## 2021-04-01 MED ORDER — ALLOPURINOL 100 MG PO TABS: 100.0000 mg | ORAL_TABLET | Freq: Every day | ORAL | 1 refills | Status: DC

## 2021-04-02 ENCOUNTER — Other Ambulatory Visit: Payer: Self-pay

## 2021-04-02 MED ORDER — AMLODIPINE BESYLATE 5 MG PO TABS: ORAL_TABLET | ORAL | 0 refills | Status: DC

## 2021-04-02 MED ORDER — BUPROPION HCL ER (XL) 150 MG PO TB24: ORAL_TABLET | ORAL | 1 refills | Status: DC

## 2021-04-02 MED ORDER — ATORVASTATIN CALCIUM 40 MG PO TABS: 40.0000 mg | ORAL_TABLET | Freq: Every day | ORAL | 0 refills | Status: DC

## 2021-04-07 NOTE — Progress Notes (Signed)
Cardiology Office Note    Date:  04/21/2021   ID:  [name], [name] [birth year], MRN [id number]   PCP:  Kathyrn Drown, MD   [place]  Cardiologist:  Jenkins Rouge, MD   Advanced Practice Provider:  No care team member to display Electrophysiologist:  None   [id number]}   Chief Complaint  Patient presents with   Follow-up    History of Present Illness:  [name] [name] is a 71 y.o. male with history of HTN, HLD, DM, chronic LE edema, normal myoview in 2020.   Patietn saw Dr. Johnsie Cancel 03/09/21 with exertional diaphoresis and fatigue for 6 months since getting epidural injections in his back. Has since undergone laminectomy. Plan was to see him back to order cardiac CTA vs myoview after his surgery. Echo 03/10/21 normal.   Patient comes in for f/u. Feeling much better from surgery. BP up but has white coat syndrome. The exertional diaphoresis has improved slightly. Mother with CHF and died MI 50, brother CHF.     Past Medical History:  Diagnosis Date   Aortic atherosclerosis ([place]) 10/19/2016    Seen on chest x-ray July 2018   Arthritis    right knee and hip    Cancer [facility])    prostate cancer    Chronic kidney disease    prostate cancer    Diabetes mellitus without complication ([place])    GERD (gastroesophageal reflux disease)    Gout    Gout    Hypercholesteremia    Hyperglycemia    Hyperlipidemia    Hypertension    Sleep apnea    Status post THR (total hip replacement) 04/20/13 05/01/2013   Right total hip 04/20/2013 Dr. Lydia Guiles implant     Past Surgical History:  Procedure Laterality Date   COLONOSCOPY     COLONOSCOPY N/A 10/22/2013   Procedure: COLONOSCOPY;  Surgeon: Danie Binder, MD;  [facility];  Service: Endoscopy;  Laterality: N/A;  1115   HERNIA REPAIR     right inguinal hernia repair    LUMBAR LAMINECTOMY/DECOMPRESSION MICRODISCECTOMY Right 03/16/2021   Procedure: Right Lumbar four-five  Laminectomy/foraminotomy;  Surgeon: Eustace Moore, MD;  Location: [place];  Service: Neurosurgery;  Laterality: Right;   NASAL SEPTUM SURGERY     OTHER SURGICAL HISTORY     deviated septum surgery    ROBOT ASSISTED LAPAROSCOPIC RADICAL PROSTATECTOMY  04/20/2011   Procedure: ROBOTIC ASSISTED LAPAROSCOPIC RADICAL PROSTATECTOMY;  Surgeon: Malka So, MD;  [facility];  Service: Urology;  Laterality: N/A;  Bilateral lymph node dissection   TOTAL HIP ARTHROPLASTY Right 04/20/2013   Procedure: TOTAL HIP ARTHROPLASTY;  Surgeon: Carole Civil, MD;  Location: [facility];  Service: Orthopedics;  Laterality: Right;   TOTAL KNEE ARTHROPLASTY Right 05/16/2018   Procedure: TOTAL KNEE ARTHROPLASTY;  Surgeon: Carole Civil, MD;  Location: [facility];  Service: Orthopedics;  Laterality: Right;    Current Medications: Current Meds  Medication Sig   allopurinol (ZYLOPRIM) 100 MG tablet Take 1 tablet (100 mg total) by mouth daily.   AMBULATORY NON FORMULARY MEDICATION Medication Name: Testosterone 4% Gel  Apply 3.24MW ( 3 clicks) topically daily as directed   amLODipine (NORVASC) 5 MG tablet TAKE 1 TABLET EVERY DAY   aspirin 81 MG chewable tablet Chew 81 mg by mouth daily.    atorvastatin (LIPITOR) 40 MG tablet Take 1 tablet (40 mg total) by mouth daily.   B Complex-C (SUPER B COMPLEX PO)  Take 1 tablet by mouth daily.   buPROPion (WELLBUTRIN XL) 150 MG 24 hr tablet TAKE ONE TABLET BY MOUTH DAILY *STOP CITALOPRAM*   gabapentin (NEURONTIN) 300 MG capsule Take 1 capsule (300 mg total) by mouth 3 (three) times daily. (Patient taking differently: Take 300 mg by mouth as needed.)   HYDROcodone-acetaminophen (NORCO/VICODIN) 5-325 MG tablet Take 1 tablet by mouth every 4 (four) hours as needed for moderate pain.   meloxicam (MOBIC) 7.5 MG tablet Take 1 tablet (7.5 mg total) by mouth daily.   Multiple Vitamin (MULTIVITAMIN) tablet Take 1 tablet by mouth daily.   potassium chloride (KLOR-CON M) 10 MEQ tablet  Take 1 tablet (10 mEq total) by mouth daily.   sildenafil (REVATIO) 20 MG tablet 1-5 po prn   [DISCONTINUED] Testosterone POWD 4% testosterone cream apply 3 clicks topically 1 am     Allergies:   Citalopram and Lisinopril   Social History   Socioeconomic History   Marital status: Single    Spouse name: Not on file   Number of children: Not on file   Years of education: Not on file   Highest education level: Not on file  Occupational History   Not on file  Tobacco Use   Smoking status: Former    Packs/day: 0.50    Years: 8.00    Pack years: 4.00    Types: Cigarettes    Quit date: 03/22/1984    Years since quitting: 37.1   Smokeless tobacco: Never  Vaping Use   Vaping Use: Never used  Substance and Sexual Activity   Alcohol use: Yes    Comment: occasional    Drug use: No   Sexual activity: Yes    Birth control/protection: None  Other Topics Concern   Not on file  Social History Narrative   Not on file   Social Determinants of Health   Financial Resource Strain: Low Risk    Difficulty of Paying Living Expenses: Not hard at all  Food Insecurity: No Food Insecurity   Worried About Charity fundraiser in the Last Year: Never true   Ran Out of Food in the Last Year: Never true  Transportation Needs: No Transportation Needs   Lack of Transportation (Medical): No   Lack of Transportation (Non-Medical): No  Physical Activity: Insufficiently Active   Days of Exercise per Week: 2 days   Minutes of Exercise per Session: 10 min  Stress: No Stress Concern Present   Feeling of Stress : Not at all  Social Connections: Moderately Isolated   Frequency of Communication with Friends and Family: More than three times a week   Frequency of Social Gatherings with Friends and Family: More than three times a week   Attends Religious Services: Never   Marine scientist or Organizations: No   Attends Music therapist: Never   Marital Status: Living with partner      Family History:  The patient's  family history includes Diabetes in his mother; Hypertension in his brother and mother.   ROS:   Please see the history of present illness.    ROS All other systems reviewed and are negative.   PHYSICAL EXAM:   VS:  BP (!) 158/82    Pulse 94    Ht 5\' 9"  (1.753 m)    Wt 204 lb (92.5 kg)    BMI 30.13 kg/m   Physical Exam  GEN: obese , in no acute distress  Neck: no JVD, carotid bruits, or masses  Cardiac:RRR; no murmurs, rubs, or gallops  Respiratory:  clear to auscultation bilaterally, normal work of breathing GI: soft, nontender, nondistended, + BS Ext: without cyanosis, clubbing, or edema, Good distal pulses bilaterally Neuro:  Alert and Oriented x 3 Psych: euthymic mood, full affect  Wt Readings from Last 3 Encounters:  04/21/21 204 lb (92.5 kg)  03/16/21 200 lb (90.7 kg)  03/09/21 206 lb (93.4 kg)      Studies/Labs Reviewed:   EKG:  EKG is not ordered today.   Recent Labs: 10/30/2020: Hemoglobin 14.5 11/26/2020: ALT 16; BUN 11; Creatinine, Ser 1.14; Potassium 4.8; Sodium 138   Lipid Panel    Component Value Date/Time   CHOL 191 11/26/2020 0804   TRIG 113 11/26/2020 0804   HDL 55 11/26/2020 0804   CHOLHDL 3.5 11/26/2020 0804   CHOLHDL 4.7 12/05/2013 0705   VLDL 48 (H) 12/05/2013 0705   LDLCALC 116 (H) 11/26/2020 0804    Additional studies/ records that were reviewed today include:  Echo 03/10/21 IMPRESSIONS Basal inferior hypokinesis, basal inferolateral dyskinesis. . Left ventricular ejection fraction, by estimation, is 65 to 70%. The left ventricle has normal function. Left ventricular diastolic parameters are indeterminate. 1. 2. Right ventricular systolic function is normal. The right ventricular size is normal. 3. The mitral valve is normal in structure. No evidence of mitral valve regurgitation. The aortic valve is tricuspid. Aortic valve regurgitation is not visualized. Aortic valve sclerosis is present, with no evidence  of aortic valve stenosis. 4. FINDINGS Left Ventricle: Basal inferior hypokinesis, basal inferolateral dyskinesis. Left ventricular ejection fraction, by estimation, is 65 to 70%. The left ventricle has normal function. The left ventricular internal cavity size was normal in size. There is no left ventricular hypertrophy. Left ventricular diastolic parameters are indeterminate. Right Ventricle: The right ventricular size is normal. Right vetricular wall thickness was not assessed. Right ventricular systolic function is normal. Left Atrium: Left atrial size was normal in size. Right Atrium: Right atrial size was normal in size. Pericardium: There is no evidence of pericardial effusion.   Risk Assessment/Calculations:         ASSESSMENT:    1. Fatigue due to excessive exertion, subsequent encounter   2. Essential hypertension   3. Hyperlipidemia, unspecified hyperlipidemia type   4. Exertional angina (HCC)   5. Prediabetes      PLAN:  In order of problems listed above:  Exertional fatigue and diaphoresis-plan to evaluate with coronary CTA or myoview. Echo 03/10/21 normal. Patient prefers to proceed with coronary CTA. Will order.  HTN-up here but controlled at home on amlodipine  HLD on lipitor  DM managed by PCP  Shared Decision Making/Informed Consent     Medication Adjustments/Labs and Tests Ordered: Current medicines are reviewed at length with the patient today.  Concerns regarding medicines are outlined above.  Medication changes, Labs and Tests ordered today are listed in the Patient Instructions below. Patient Instructions  Medication Instructions:    Your physician recommends that you continue on your current medications as directed. Please refer to the Current Medication list given to you today.  *If you need a refill on your cardiac medications before your next appointment, please call your pharmacy*   Lab Work:  BMET TODAY    If you have labs (blood  work) drawn today and your tests are completely normal, you will receive your results only by: [place] (if you have MyChart) OR A paper copy in the mail If you have any lab test that is abnormal  or we need to change your treatment, we will call you to review the results.   Testing/Procedures: Non-Cardiac CT Angiography (CTA), is a special type of CT scan that uses a computer to produce multi-dimensional views of major blood vessels throughout the body. In CT angiography, a contrast material is injected through an IV to help visualize the blood vessels     Follow-Up: At [facility], you and your health needs are our priority.  As part of our continuing mission to provide you with exceptional heart care, we have created designated Provider Care Teams.  These Care Teams include your primary Cardiologist (physician) and Advanced Practice Providers (APPs -  Physician Assistants and Nurse Practitioners) who all work together to provide you with the care you need, when you need it.  We recommend signing up for the patient portal called "MyChart".  Sign up information is provided on this After Visit Summary.  MyChart is used to connect with patients for Virtual Visits (Telemedicine).  Patients are able to view lab/test results, encounter notes, upcoming appointments, etc.  Non-urgent messages can be sent to your provider as well.   To learn more about what you can do with MyChart, go to [email or web address].    Your next appointment:   6 month(s)  The format for your next appointment:   In Person  Provider:   You may see Jenkins Rouge, MD        Signed, Ermalinda Barrios, PA-C  04/21/2021 1:01 PM    [facility] [address], [place], [place]  [address] Phone: [phone]; Fax: [phone]

## 2021-04-20 ENCOUNTER — Telehealth: Payer: Self-pay | Admitting: Family Medicine

## 2021-04-20 NOTE — Telephone Encounter (Signed)
Patient is on atorvastatin 40 mg he had to switch to [facility] he is having issues getting medication and wanting someone to call this number [phone] so he can get medication.

## 2021-04-21 ENCOUNTER — Other Ambulatory Visit: Payer: Self-pay

## 2021-04-21 ENCOUNTER — Ambulatory Visit: Payer: Medicare Other | Admitting: Physician Assistant

## 2021-04-21 ENCOUNTER — Encounter: Payer: Self-pay | Admitting: Physician Assistant

## 2021-04-21 ENCOUNTER — Other Ambulatory Visit (HOSPITAL_COMMUNITY)
Admission: RE | Admit: 2021-04-21 | Discharge: 2021-04-21 | Disposition: A | Discharge status: Home / Self Care | Payer: Medicare Other | Source: Ambulatory Visit | Attending: Physician Assistant | Admitting: Physician Assistant

## 2021-04-21 VITALS — BP 158/82 | HR 94 | Ht 69.0 in | Wt 204.0 lb

## 2021-04-21 DIAGNOSIS — R7303 Prediabetes: Secondary | ICD-10-CM | POA: Diagnosis not present

## 2021-04-21 DIAGNOSIS — T733XXD Exhaustion due to excessive exertion, subsequent encounter: Secondary | ICD-10-CM

## 2021-04-21 DIAGNOSIS — E785 Hyperlipidemia, unspecified: Secondary | ICD-10-CM | POA: Diagnosis not present

## 2021-04-21 DIAGNOSIS — I208 Other forms of angina pectoris: Secondary | ICD-10-CM | POA: Insufficient documentation

## 2021-04-21 DIAGNOSIS — I10 Essential (primary) hypertension: Secondary | ICD-10-CM

## 2021-04-21 LAB — BASIC METABOLIC PANEL
Anion gap: 6 (ref 5–15)
BUN: 13 mg/dL (ref 8–23)
CO2: 29 mmol/L (ref 22–32)
Calcium: 8.9 mg/dL (ref 8.9–10.3)
Chloride: 100 mmol/L (ref 98–111)
Creatinine, Ser: 0.96 mg/dL (ref 0.61–1.24)
GFR, Estimated: >60 mL/min (ref >60)
Glucose, Bld: 96 mg/dL (ref 70–99)
Potassium: 3.5 mmol/L (ref 3.5–5.1)
Sodium: 135 mmol/L (ref 135–145)

## 2021-04-21 MED ORDER — METOPROLOL TARTRATE 100 MG PO TABS: 100.0000 mg | ORAL_TABLET | Freq: Once | ORAL | 0 refills | Status: DC

## 2021-04-21 NOTE — Telephone Encounter (Signed)
[facility] called and clarified medication and allergies- Patient has been on medication since 2016 with no issues. They stated they would send out the medication today- Patient notified.

## 2021-04-21 NOTE — Patient Instructions (Addendum)
Medication Instructions:    Your physician recommends that you continue on your current medications as directed. Please refer to the Current Medication list given to you today.  *If you need a refill on your cardiac medications before your next appointment, please call your pharmacy*   Lab Work:  BMET TODAY    If you have labs (blood work) drawn today and your tests are completely normal, you will receive your results only by: [place] (if you have MyChart) OR A paper copy in the mail If you have any lab test that is abnormal or we need to change your treatment, we will call you to review the results.   Testing/Procedures: Non-Cardiac CT Angiography (CTA), is a special type of CT scan that uses a computer to produce multi-dimensional views of major blood vessels throughout the body. In CT angiography, a contrast material is injected through an IV to help visualize the blood vessels     Follow-Up: At [facility], you and your health needs are our priority.  As part of our continuing mission to provide you with exceptional heart care, we have created designated Provider Care Teams.  These Care Teams include your primary Cardiologist (physician) and Advanced Practice Providers (APPs -  Physician Assistants and Nurse Practitioners) who all work together to provide you with the care you need, when you need it.  We recommend signing up for the patient portal called "MyChart".  Sign up information is provided on this After Visit Summary.  MyChart is used to connect with patients for Virtual Visits (Telemedicine).  Patients are able to view lab/test results, encounter notes, upcoming appointments, etc.  Non-urgent messages can be sent to your provider as well.   To learn more about what you can do with MyChart, go to [email or web address].    Your next appointment:   6 month(s)  The format for your next appointment:   In Person  Provider:   You may see Jenkins Rouge, MD       Your cardiac CT will be scheduled at one of the below locations:   [facility] [address] [place], [place] [address] [phone]  [place] [address] [place],  [address] [phone]  If scheduled at [facility], please arrive at the [facility] main entrance (entrance A) of [facility] 30 minutes prior to test start time. You can use the FREE valet parking offered at the main entrance (encouraged to control the heart rate for the test) Proceed to the [facility] Radiology Department (first floor) to check-in and test prep.  If scheduled at [facility], please arrive 15 mins early for check-in and test prep.  Please follow these instructions carefully (unless otherwise directed):  Hold all erectile dysfunction medications at least 3 days (72 hrs) prior to test.  On the Night Before the Test: Be sure to Drink plenty of water. Do not consume any caffeinated/decaffeinated beverages or chocolate 12 hours prior to your test. Do not take any antihistamines 12 hours prior to your test.   On the Day of the Test: Drink plenty of water until 1 hour prior to the test. Do not eat any food 4 hours prior to the test. You may take your regular medications prior to the test.  Take metoprolol (Lopressor) two hours prior to test. HOLD Furosemide/Hydrochlorothiazide morning of the test.       After the Test: Drink plenty of water.  After receiving IV contrast, you may experience a mild flushed feeling. This is normal. On occasion, you may experience a mild rash up to 24 hours after the test. This is not dangerous. If this occurs, you can take Benadryl 25 mg and increase your fluid intake. If you experience trouble breathing, this can be serious. If it is severe call 911 IMMEDIATELY. If it is mild, please call our office. If you take any of these medications:  Glipizide/Metformin, Avandament, Glucavance, please do not take 48 hours after completing test unless otherwise instructed.  We will call to schedule your test 2-4 weeks out understanding that some insurance companies will need an authorization prior to the service being performed.   For non-scheduling related questions, please contact the cardiac imaging nurse navigator should you have any questions/concerns: Marchia Bond, Cardiac Imaging Nurse Navigator Gordy Clement, Cardiac Imaging Nurse Navigator [facility] Direct Office Dial: [phone]   For scheduling needs, including cancellations and rescheduling, please call [place], [phone].

## 2021-04-21 NOTE — Telephone Encounter (Signed)
Thank you-FYI-this was removed from his allergy list because it is not a true allergy he will at times had side effects of muscle discomfort

## 2021-04-29 ENCOUNTER — Other Ambulatory Visit: Payer: Self-pay

## 2021-04-29 ENCOUNTER — Other Ambulatory Visit: Payer: Medicare Other

## 2021-04-29 DIAGNOSIS — E291 Testicular hypofunction: Secondary | ICD-10-CM

## 2021-04-29 DIAGNOSIS — Z8546 Personal history of malignant neoplasm of prostate: Secondary | ICD-10-CM

## 2021-04-30 ENCOUNTER — Telehealth (HOSPITAL_COMMUNITY): Payer: Self-pay | Admitting: Emergency Medicine

## 2021-04-30 ENCOUNTER — Other Ambulatory Visit: Payer: Medicare HMO

## 2021-04-30 LAB — HEMOGLOBIN AND HEMATOCRIT, BLOOD
Hematocrit: 45.2 % (ref 37.5–51.0)
Hemoglobin: 15.2 g/dL (ref 13.0–17.7)

## 2021-04-30 LAB — PSA: Prostate Specific Ag, Serum: <0.1 ng/mL (ref 0.0–4.0)

## 2021-04-30 LAB — TESTOSTERONE: Testosterone: 272 ng/dL (ref 264–916)

## 2021-04-30 NOTE — Telephone Encounter (Signed)
Reaching out to patient to offer assistance regarding upcoming cardiac imaging study; pt verbalizes understanding of appt date/time, parking situation and where to check in, pre-test NPO status and medications ordered, and verified current allergies; name and call back number provided for further questions should they arise Marchia Bond RN Navigator Cardiac Imaging [facility] [phone] office [phone] cell  100mg  metoprolol tartrate Arrival 730 Holding sildenafil

## 2021-04-30 NOTE — Telephone Encounter (Signed)
Attempted to call patient regarding upcoming cardiac CT appointment. °Left message on voicemail with name and callback number °Chaniqua Brisby RN Navigator Cardiac Imaging °[facility] °[phone] Office °[phone] Cell ° °

## 2021-05-04 ENCOUNTER — Encounter (HOSPITAL_COMMUNITY): Payer: Self-pay

## 2021-05-04 ENCOUNTER — Other Ambulatory Visit: Payer: Self-pay

## 2021-05-04 ENCOUNTER — Ambulatory Visit (HOSPITAL_COMMUNITY)
Admission: RE | Admit: 2021-05-04 | Discharge: 2021-05-04 | Disposition: A | Discharge status: Home / Self Care | Payer: Medicare Other | Source: Ambulatory Visit | Attending: Internal Medicine | Admitting: Internal Medicine

## 2021-05-04 ENCOUNTER — Other Ambulatory Visit: Payer: Self-pay | Admitting: Internal Medicine

## 2021-05-04 ENCOUNTER — Ambulatory Visit (HOSPITAL_COMMUNITY)
Admission: RE | Admit: 2021-05-04 | Discharge: 2021-05-04 | Disposition: A | Discharge status: Home / Self Care | Payer: Medicare Other | Source: Ambulatory Visit | Attending: Physician Assistant | Admitting: Physician Assistant

## 2021-05-04 DIAGNOSIS — R931 Abnormal findings on diagnostic imaging of heart and coronary circulation: Secondary | ICD-10-CM

## 2021-05-04 DIAGNOSIS — I208 Other forms of angina pectoris: Secondary | ICD-10-CM | POA: Diagnosis not present

## 2021-05-04 DIAGNOSIS — I2510 Atherosclerotic heart disease of native coronary artery without angina pectoris: Secondary | ICD-10-CM | POA: Diagnosis not present

## 2021-05-04 MED ORDER — METOPROLOL TARTRATE 5 MG/5ML IV SOLN
10.0000 mg | Freq: Two times a day (BID) | INTRAVENOUS | Status: DC
  Administered 2021-05-04: 10 mg via INTRAVENOUS

## 2021-05-04 MED ORDER — DILTIAZEM HCL 25 MG/5ML IV SOLN: 10.0000 mg | Freq: Once | INTRAVENOUS | Status: AC

## 2021-05-04 MED ORDER — DILTIAZEM HCL 25 MG/5ML IV SOLN
INTRAVENOUS | Status: AC
  Administered 2021-05-04: 10 mg via INTRAVENOUS
  Filled 2021-05-04: qty 5

## 2021-05-04 MED ORDER — NITROGLYCERIN 0.4 MG SL SUBL
SUBLINGUAL_TABLET | SUBLINGUAL | Status: AC
  Filled 2021-05-04: qty 2

## 2021-05-04 MED ORDER — IOHEXOL 350 MG/ML SOLN
95.0000 mL | Freq: Once | INTRAVENOUS | Status: AC | PRN
  Administered 2021-05-04: 95 mL via INTRAVENOUS

## 2021-05-04 MED ORDER — NITROGLYCERIN 0.4 MG SL SUBL
0.8000 mg | SUBLINGUAL_TABLET | Freq: Once | SUBLINGUAL | Status: AC
  Administered 2021-05-04: 0.8 mg via SUBLINGUAL

## 2021-05-04 MED ORDER — METOPROLOL TARTRATE 5 MG/5ML IV SOLN
INTRAVENOUS | Status: AC
  Filled 2021-05-04: qty 10

## 2021-05-04 MED ORDER — METOPROLOL TARTRATE 5 MG/5ML IV SOLN
INTRAVENOUS | Status: AC
  Administered 2021-05-04: 10 mg via INTRAVENOUS
  Filled 2021-05-04: qty 10

## 2021-05-04 NOTE — Progress Notes (Signed)
Please send for FFR - Dr. Terion Hedman 

## 2021-05-07 ENCOUNTER — Ambulatory Visit: Payer: Medicare HMO | Admitting: Urology

## 2021-05-14 ENCOUNTER — Other Ambulatory Visit: Payer: Self-pay

## 2021-05-14 ENCOUNTER — Ambulatory Visit: Payer: Medicare Other | Admitting: Urology

## 2021-05-14 VITALS — BP 164/77 | HR 101 | Ht 67.0 in | Wt 204.0 lb

## 2021-05-14 DIAGNOSIS — N3941 Urge incontinence: Secondary | ICD-10-CM | POA: Diagnosis not present

## 2021-05-14 DIAGNOSIS — N5201 Erectile dysfunction due to arterial insufficiency: Secondary | ICD-10-CM

## 2021-05-14 DIAGNOSIS — Z8546 Personal history of malignant neoplasm of prostate: Secondary | ICD-10-CM | POA: Diagnosis not present

## 2021-05-14 DIAGNOSIS — E291 Testicular hypofunction: Secondary | ICD-10-CM

## 2021-05-14 LAB — URINALYSIS, ROUTINE W REFLEX MICROSCOPIC
Bilirubin, UA: NEGATIVE
Glucose, UA: NEGATIVE
Ketones, UA: NEGATIVE
Leukocytes,UA: NEGATIVE
Nitrite, UA: NEGATIVE
Protein,UA: NEGATIVE
Specific Gravity, UA: 1.015 (ref 1.005–1.030)
Urobilinogen, Ur: 0.2 mg/dL (ref 0.2–1.0)
pH, UA: 7 (ref 5.0–7.5)

## 2021-05-14 LAB — MICROSCOPIC EXAMINATION
Bacteria, UA: NONE SEEN
Epithelial Cells (non renal): NONE SEEN /hpf (ref 0–10)
RBC, Urine: NONE SEEN /hpf (ref 0–2)
Renal Epithel, UA: NONE SEEN /hpf
WBC, UA: NONE SEEN /hpf (ref 0–5)

## 2021-05-14 MED ORDER — MIRABEGRON ER 25 MG PO TB24: 25.0000 mg | ORAL_TABLET | Freq: Every day | ORAL | 0 refills | Status: DC

## 2021-05-14 NOTE — Progress Notes (Signed)
Subjective:  1. Urge incontinence   2. Hypogonadism male   3. History of prostate cancer   4. Erectile dysfunction due to arterial insufficiency       [name] returns today in f/u for his history of hypogonadism and prostate cancer. He is s/p a RALP on 04/20/11. His PSA was <0.1 on 04/29/21. He had Gleason 7(3+4) T2c N0 disease. He is doing well and has no SUI but can have UUI but that is rare if he is careful and has noted that once he gets the urge he can only hold it q15-20 min.  He will wear a pad if he is going out.  He rarely has some urgency with subsequent difficulty voiding.  He has no hematuria or dysuria. He has a good stream. His IPSS is 11.  UA is clear.  He has had some erectile dysfunction that he manages with sildenafil 20mg .   He remains on compounded testosterone 4% 3 clicks daily. His T level is 272 and his Hgb is 15.2 which is stable.   He is doing well with improved energy.     IPSS     Row Name 05/14/21 1100         International Prostate Symptom Score   How often have you had the sensation of not emptying your bladder? Less than half the time     How often have you had to urinate less than every two hours? More than half the time     How often have you found you stopped and started again several times when you urinated? Not at All     How often have you found it difficult to postpone urination? Less than 1 in 5 times     How often have you had a weak urinary stream? Not at All     How often have you had to strain to start urination? Not at All     How many times did you typically get up at night to urinate? 4 Times     Total IPSS Score 11       Quality of Life due to urinary symptoms   If you were to spend the rest of your life with your urinary condition just the way it is now how would you feel about that? Mostly Satisfied                       ROS:  ROS:  A complete review of systems was performed.  All systems are negative except for  pertinent findings as noted.   ROS  Allergies  Allergen Reactions   Citalopram     Fatigue headache    Lisinopril Cough    Dry cough    Outpatient Encounter Medications as of 05/14/2021  Medication Sig   allopurinol (ZYLOPRIM) 100 MG tablet Take 1 tablet (100 mg total) by mouth daily.   AMBULATORY NON FORMULARY MEDICATION Medication Name: Testosterone 4% Gel  Apply 9.48NI ( 3 clicks) topically daily as directed   amLODipine (NORVASC) 5 MG tablet TAKE 1 TABLET EVERY DAY   aspirin 81 MG chewable tablet Chew 81 mg by mouth daily.    atorvastatin (LIPITOR) 40 MG tablet Take 1 tablet (40 mg total) by mouth daily.   B Complex-C (SUPER B COMPLEX PO) Take 1 tablet by mouth daily.   buPROPion (WELLBUTRIN XL) 150 MG 24 hr tablet TAKE ONE TABLET BY MOUTH DAILY *STOP CITALOPRAM*   HYDROcodone-acetaminophen (NORCO/VICODIN) 5-325 MG tablet  Take 1 tablet by mouth every 4 (four) hours as needed for moderate pain.   meloxicam (MOBIC) 7.5 MG tablet Take 1 tablet (7.5 mg total) by mouth daily.   mirabegron ER (MYRBETRIQ) 25 MG TB24 tablet Take 1 tablet (25 mg total) by mouth daily.   Multiple Vitamin (MULTIVITAMIN) tablet Take 1 tablet by mouth daily.   potassium chloride (KLOR-CON M) 10 MEQ tablet Take 1 tablet (10 mEq total) by mouth daily.   sildenafil (REVATIO) 20 MG tablet 1-5 po prn   metoprolol tartrate (LOPRESSOR) 100 MG tablet Take 1 tablet (100 mg total) by mouth once for 1 dose. Take 2 hours prior to procedure   [DISCONTINUED] gabapentin (NEURONTIN) 300 MG capsule Take 1 capsule (300 mg total) by mouth 3 (three) times daily. (Patient not taking: Reported on 05/14/2021)   No facility-administered encounter medications on file as of 05/14/2021.    Past Medical History:  Diagnosis Date   Aortic atherosclerosis ([place]) 10/19/2016    Seen on chest x-ray July 2018   Arthritis    right knee and hip    Cancer [facility])    prostate cancer    Chronic kidney disease    prostate cancer    Diabetes  mellitus without complication ([place])    GERD (gastroesophageal reflux disease)    Gout    Gout    Hypercholesteremia    Hyperglycemia    Hyperlipidemia    Hypertension    Sleep apnea    Status post THR (total hip replacement) 04/20/13 05/01/2013   Right total hip 04/20/2013 Dr. Lydia Guiles implant     Past Surgical History:  Procedure Laterality Date   COLONOSCOPY     COLONOSCOPY N/A 10/22/2013   Procedure: COLONOSCOPY;  Surgeon: Danie Binder, MD;  [facility];  Service: Endoscopy;  Laterality: N/A;  1115   HERNIA REPAIR     right inguinal hernia repair    LUMBAR LAMINECTOMY/DECOMPRESSION MICRODISCECTOMY Right 03/16/2021   Procedure: Right Lumbar four-five Laminectomy/foraminotomy;  Surgeon: Eustace Moore, MD;  Location: [place];  Service: Neurosurgery;  Laterality: Right;   NASAL SEPTUM SURGERY     OTHER SURGICAL HISTORY     deviated septum surgery    ROBOT ASSISTED LAPAROSCOPIC RADICAL PROSTATECTOMY  04/20/2011   Procedure: ROBOTIC ASSISTED LAPAROSCOPIC RADICAL PROSTATECTOMY;  Surgeon: Malka So, MD;  Location: [facility];  Service: Urology;  Laterality: N/A;  Bilateral lymph node dissection   TOTAL HIP ARTHROPLASTY Right 04/20/2013   Procedure: TOTAL HIP ARTHROPLASTY;  Surgeon: Carole Civil, MD;  Location: [facility];  Service: Orthopedics;  Laterality: Right;   TOTAL KNEE ARTHROPLASTY Right 05/16/2018   Procedure: TOTAL KNEE ARTHROPLASTY;  Surgeon: Carole Civil, MD;  Location: [facility];  Service: Orthopedics;  Laterality: Right;    Social History   Socioeconomic History   Marital status: Single    Spouse name: Not on file   Number of children: Not on file   Years of education: Not on file   Highest education level: Not on file  Occupational History   Not on file  Tobacco Use   Smoking status: Former    Packs/day: 0.50    Years: 8.00    Pack years: 4.00    Types: Cigarettes    Quit date: 03/22/1984    Years since quitting: 37.1   Smokeless tobacco:  Never  Vaping Use   Vaping Use: Never used  Substance and Sexual Activity   Alcohol use: Yes    Comment:  occasional    Drug use: No   Sexual activity: Yes    Birth control/protection: None  Other Topics Concern   Not on file  Social History Narrative   Not on file   Social Determinants of Health   Financial Resource Strain: Low Risk    Difficulty of Paying Living Expenses: Not hard at all  Food Insecurity: No Food Insecurity   Worried About Charity fundraiser in the Last Year: Never true   [place] in the Last Year: Never true  Transportation Needs: No Transportation Needs   Lack of Transportation (Medical): No   Lack of Transportation (Non-Medical): No  Physical Activity: Insufficiently Active   Days of Exercise per Week: 2 days   Minutes of Exercise per Session: 10 min  Stress: No Stress Concern Present   Feeling of Stress : Not at all  Social Connections: Moderately Isolated   Frequency of Communication with Friends and Family: More than three times a week   Frequency of Social Gatherings with Friends and Family: More than three times a week   Attends Religious Services: Never   Marine scientist or Organizations: No   Attends Music therapist: Never   Marital Status: Living with partner  Intimate Partner Violence: Not At Risk   Fear of Current or Ex-Partner: No   Emotionally Abused: No   Physically Abused: No   Sexually Abused: No    Family History  Problem Relation Age of Onset   Diabetes Mother    Hypertension Mother    Hypertension Brother        Objective: BP (!) 164/77    Pulse (!) 101    Ht 5\' 7"  (1.702 m)    Wt 204 lb (92.5 kg)    BMI 31.95 kg/m     Physical Exam  Lab Results:  Results for orders placed or performed in visit on 05/14/21 (from the past 24 hour(s))  Urinalysis, Routine w reflex microscopic     Status: Abnormal   Collection Time: 05/14/21 11:57 AM  Result Value Ref Range   Specific Gravity, UA 1.015  1.005 - 1.030   pH, UA 7.0 5.0 - 7.5   Color, UA Yellow Yellow   Appearance Ur Clear Clear   Leukocytes,UA Negative Negative   Protein,UA Negative Negative/Trace   Glucose, UA Negative Negative   Ketones, UA Negative Negative   RBC, UA Trace (A) Negative   Bilirubin, UA Negative Negative   Urobilinogen, Ur 0.2 0.2 - 1.0 mg/dL   Nitrite, UA Negative Negative   Microscopic Examination See below:    Narrative   Performed at:  [facility] [address], [place], [place]  [id number] Lab Director: Mina Marble MT, Phone:  [phone]  Microscopic Examination     Status: None   Collection Time: 05/14/21 11:57 AM   Urine  Result Value Ref Range   WBC, UA None seen 0 - 5 /hpf   RBC None seen 0 - 2 /hpf   Epithelial Cells (non renal) None seen 0 - 10 /hpf   Renal Epithel, UA None seen None seen /hpf   Bacteria, UA None seen None seen/Few   Narrative   Performed at:  [facility] [address], [place], [place]  [id number] Lab Director: Mina Marble MT, Phone:  [phone]     BMET No results for input(s): NA, K, CL, CO2, GLUCOSE, BUN, CREATININE, CALCIUM in the last 72 hours. PSA PSA  Date Value Ref Range Status  05/09/2019 <0.1 < OR = 4.0 ng/mL Final    Comment:    The total PSA value from this assay system is  standardized against the WHO standard. The test  result will be approximately 20% lower when compared  to the equimolar-standardized total PSA (Beckman  Coulter). Comparison of serial PSA results should be  interpreted with this fact in mind. . This test was performed using the Siemens  chemiluminescent method. Values obtained from  different assay methods cannot be used interchangeably. PSA levels, regardless of value, should not be interpreted as absolute evidence of the presence or absence of disease.    Testosterone  Date Value Ref Range Status  04/29/2021 272 264 - 916 ng/dL Final    Comment:    Adult male reference  interval is based on a population of healthy nonobese males (BMI <30) between 38 and 69 years old. [place], [place] [phone]. PMID: 00370488.   10/30/2020 285 264 - 916 ng/dL Final    Comment:    Adult male reference interval is based on a population of healthy nonobese males (BMI <30) between 47 and 46 years old. [place], [place] [phone]. PMID: 03491791.   05/08/2020 308 264 - 916 ng/dL Final    Comment:    Adult male reference interval is based on a population of healthy nonobese males (BMI <30) between 50 and 40 years old. [place], [place] [phone]. PMID: 74827078.    Hgb is 14.4.  UA is clear.  Results for orders placed or performed in visit on 05/14/21 (from the past 24 hour(s))  Urinalysis, Routine w reflex microscopic     Status: Abnormal   Collection Time: 05/14/21 11:57 AM  Result Value Ref Range   Specific Gravity, UA 1.015 1.005 - 1.030   pH, UA 7.0 5.0 - 7.5   Color, UA Yellow Yellow   Appearance Ur Clear Clear   Leukocytes,UA Negative Negative   Protein,UA Negative Negative/Trace   Glucose, UA Negative Negative   Ketones, UA Negative Negative   RBC, UA Trace (A) Negative   Bilirubin, UA Negative Negative   Urobilinogen, Ur 0.2 0.2 - 1.0 mg/dL   Nitrite, UA Negative Negative   Microscopic Examination See below:    Narrative   Performed at:  [facility] [address] [place], [place]  [id number] Lab Director: Mina Marble MT, Phone:  [phone]  Microscopic Examination     Status: None   Collection Time: 05/14/21 11:57 AM   Urine  Result Value Ref Range   WBC, UA None seen 0 - 5 /hpf   RBC None seen 0 - 2 /hpf   Epithelial Cells (non renal) None seen 0 - 10 /hpf   Renal Epithel, UA None seen None seen /hpf   Bacteria, UA None seen None seen/Few   Narrative   Performed at:  [facility] [address], [place], [place]  [id number] Lab Director: [place], Phone:   [phone]      Studies/Results: No results found.    Assessment & Plan: Hypogonadism.  He is doing well on therapy.  Med refilled with hand written script.  I will get labs in 6 months prior to f/u.  ED.  Sildenafil remains effective.  Hx of prostate cancer.  PSA remains undetectible.  Urgency with UUI and nocturia.   I will try him on Myrbetriq 25mg  and gave him samples.   Instructions and side effects reviewed.  His BP is generally lower at home.   He will call if he would like a script.     Meds ordered this encounter  Medications   mirabegron ER (MYRBETRIQ) 25 MG TB24 tablet    Sig: Take 1 tablet (25 mg total) by mouth daily.    Dispense:  28 tablet    Refill:  0      Orders Placed This Encounter  Procedures   Microscopic Examination   Urinalysis, Routine w reflex microscopic      Return in about 6 months (around 11/11/2021) for with labs.   CC: Kathyrn Drown, MD      Irine Seal 05/14/2021 Patient ID: [name] [name], male   DOB: 1950-09-07, 71 y.o.   MRN: [id number]

## 2021-05-26 ENCOUNTER — Telehealth: Payer: Self-pay

## 2021-05-26 DIAGNOSIS — M1A9XX0 Chronic gout, unspecified, without tophus (tophi): Secondary | ICD-10-CM

## 2021-05-26 DIAGNOSIS — I10 Essential (primary) hypertension: Secondary | ICD-10-CM

## 2021-05-26 DIAGNOSIS — E785 Hyperlipidemia, unspecified: Secondary | ICD-10-CM

## 2021-05-26 NOTE — Telephone Encounter (Signed)
Patient notified

## 2021-05-26 NOTE — Telephone Encounter (Signed)
I would recommend lipid, liver, uric acid, urine micro protein ? ?Diagnosis hypertension, hyperlipidemia, gout ?Please let the patient know that his recent blood work looked at his kidney function so therefore we do not need to repeat it plus also his glucose looks normal so I would not recommend A1c on this visit thank you ?

## 2021-05-26 NOTE — Telephone Encounter (Signed)
Pt came into office needing orders for lab work for his upcoming f/u appt next week. Pt is here waiting to get orders. Please advise. ? ?[phone] ? ?

## 2021-05-26 NOTE — Telephone Encounter (Signed)
Labs ordered in Epic.

## 2021-05-28 DIAGNOSIS — E785 Hyperlipidemia, unspecified: Secondary | ICD-10-CM | POA: Diagnosis not present

## 2021-05-28 DIAGNOSIS — I10 Essential (primary) hypertension: Secondary | ICD-10-CM | POA: Diagnosis not present

## 2021-05-28 DIAGNOSIS — M1A9XX0 Chronic gout, unspecified, without tophus (tophi): Secondary | ICD-10-CM | POA: Diagnosis not present

## 2021-05-29 LAB — URIC ACID: Uric Acid: 4.9 mg/dL (ref 3.8–8.4)

## 2021-05-29 LAB — MICROALBUMIN / CREATININE URINE RATIO
Creatinine, Urine: 219.9 mg/dL
Microalb/Creat Ratio: 17 mg/g creat (ref 0–29)
Microalbumin, Urine: 36.5 ug/mL

## 2021-05-29 LAB — HEPATIC FUNCTION PANEL
ALT: 13 IU/L (ref 0–44)
AST: 20 IU/L (ref 0–40)
Albumin: 5 g/dL — ABNORMAL HIGH (ref 3.7–4.7)
Alkaline Phosphatase: 124 IU/L — ABNORMAL HIGH (ref 44–121)
Bilirubin Total: 1.1 mg/dL (ref 0.0–1.2)
Bilirubin, Direct: 0.28 mg/dL (ref 0.00–0.40)
Total Protein: 7.4 g/dL (ref 6.0–8.5)

## 2021-05-29 LAB — LIPID PANEL
Chol/HDL Ratio: 3.2 ratio (ref 0.0–5.0)
Cholesterol, Total: 174 mg/dL (ref 100–199)
HDL: 55 mg/dL (ref >39)
LDL Chol Calc (NIH): 98 mg/dL (ref 0–99)
Triglycerides: 121 mg/dL (ref 0–149)
VLDL Cholesterol Cal: 21 mg/dL (ref 5–40)

## 2021-06-02 ENCOUNTER — Other Ambulatory Visit: Payer: Self-pay

## 2021-06-02 ENCOUNTER — Ambulatory Visit (INDEPENDENT_AMBULATORY_CARE_PROVIDER_SITE_OTHER): Payer: Medicare Other | Admitting: Family Medicine

## 2021-06-02 ENCOUNTER — Encounter: Payer: Self-pay | Admitting: Family Medicine

## 2021-06-02 VITALS — BP 120/68 | Temp 97.3°F | Wt 202.0 lb

## 2021-06-02 DIAGNOSIS — R29898 Other symptoms and signs involving the musculoskeletal system: Secondary | ICD-10-CM

## 2021-06-02 DIAGNOSIS — I10 Essential (primary) hypertension: Secondary | ICD-10-CM | POA: Diagnosis not present

## 2021-06-02 DIAGNOSIS — R2681 Unsteadiness on feet: Secondary | ICD-10-CM | POA: Diagnosis not present

## 2021-06-02 DIAGNOSIS — M79604 Pain in right leg: Secondary | ICD-10-CM

## 2021-06-02 DIAGNOSIS — M79605 Pain in left leg: Secondary | ICD-10-CM

## 2021-06-02 MED ORDER — GABAPENTIN 100 MG PO CAPS
ORAL_CAPSULE | ORAL | 2 refills | Status: DC
Start: 2021-06-02 — End: 2021-11-26

## 2021-06-02 NOTE — Progress Notes (Signed)
? ?  Subjective:  ? ? Patient ID: [name] [name], male    DOB: 1951/03/07, 71 y.o.   MRN: [id number] ? ?HPI ?Pt here for follow up. Pt states blood pressure has been going. Pt does report muscles in both legs are very sore from inactivity (back surgery and spinal epidurals last year) ?Essential hypertension ? ?Pain in both lower extremities - Plan: Ambulatory referral to Physical Therapy ? ?Difficulty standing - Plan: Ambulatory referral to Physical Therapy ? ?Weakness of both lower extremities - Plan: Ambulatory referral to Physical Therapy ? ?Patient have weakness in the quadricep muscles difficult time getting up out of a chair difficult time walking this kicked in last March got worse through the year ended up with injections then ended up with surgery and unfortunately is never recovered from their his pain is much better but at the end of the day when he does a lot of physical activity does hurt in his legs which makes it difficult for him to sleep ? ?Patient also had pulmonary nodules which showed up on a regular CT scan they were very small but it was recommended to do a follow-up if at high risk patient has a family history of lung cancer and 2 siblings plus also patient used to smoke but quit 30 years ago ? ?Review of Systems ? ?   ?Objective:  ? Physical Exam ?Lungs clear heart regular weakness noted in the quadriceps and hip flexors ?Patient does have some difficulty walking having to use a cane ? ? ? ?   ?Assessment & Plan:  ?GABA [place] for evening use to help with sleep and to help with pain and discomfort in the legs ?Physical therapy to help with strengthening ?Follow-up in 6 months time follow-up sooner if not having any progress ?More than likely will need repeat CT scan of the chest in 1 years time based on standard protocols but will confirm this with radiology ?Recent lab work look good will have comprehensive lab work before next visit ? ?Given the pulmonary nodule family history of lung cancer  and his remote history of smoking recommend follow-up CT scan in 1 years time ?

## 2021-06-02 NOTE — Patient Instructions (Signed)
Hi Pilar Plate ?Start off with 100 mg gabapentin each evening ? ?If not dramatically seeing improvement increase this to 2 capsules each evening ? ?Please send [place] a MyChart update in 3 to 4 weeks how this is doing for you ? ?We will send in referral for physical therapy ? ?We will let you know what we find out regarding the pulmonary nodules with follow-up  ?Thanks Dr [name]  ?

## 2021-06-03 ENCOUNTER — Other Ambulatory Visit: Payer: Self-pay | Admitting: Family Medicine

## 2021-06-09 NOTE — Progress Notes (Signed)
Patient added to reminder file- Patient notified. ?

## 2021-06-15 ENCOUNTER — Telehealth: Payer: Self-pay | Admitting: Family Medicine

## 2021-06-15 ENCOUNTER — Other Ambulatory Visit: Payer: Self-pay

## 2021-06-15 MED ORDER — HYDROCHLOROTHIAZIDE 25 MG PO TABS: 25.0000 mg | ORAL_TABLET | Freq: Every day | ORAL | 1 refills | Status: DC

## 2021-06-15 NOTE — Telephone Encounter (Signed)
90 day with one refill of HCTZ 25 mg sent to [facility]. Pt is aware ?

## 2021-06-15 NOTE — Telephone Encounter (Signed)
Pt requesting refill on HCTZ 5 mg sent to [facility]. Not on current med list but pt states he has been taking it for a while. Please advise. Thank you.  ?

## 2021-06-15 NOTE — Telephone Encounter (Signed)
Not quite sure why it is no longer on his med list ?Verify dosing-previously stated 25 mg 1 every morning ?May have 90 with 1 refill continue all other medicines as previously discussed ? ?

## 2021-06-16 ENCOUNTER — Other Ambulatory Visit: Payer: Self-pay

## 2021-06-16 ENCOUNTER — Ambulatory Visit (HOSPITAL_COMMUNITY): Payer: Medicare Other | Attending: Family Medicine

## 2021-06-16 ENCOUNTER — Encounter (HOSPITAL_COMMUNITY): Payer: Self-pay

## 2021-06-16 DIAGNOSIS — M6281 Muscle weakness (generalized): Secondary | ICD-10-CM | POA: Diagnosis not present

## 2021-06-16 DIAGNOSIS — M79604 Pain in right leg: Secondary | ICD-10-CM | POA: Diagnosis not present

## 2021-06-16 DIAGNOSIS — R2681 Unsteadiness on feet: Secondary | ICD-10-CM | POA: Insufficient documentation

## 2021-06-16 DIAGNOSIS — R29898 Other symptoms and signs involving the musculoskeletal system: Secondary | ICD-10-CM | POA: Diagnosis not present

## 2021-06-16 DIAGNOSIS — R262 Difficulty in walking, not elsewhere classified: Secondary | ICD-10-CM | POA: Insufficient documentation

## 2021-06-16 DIAGNOSIS — M79605 Pain in left leg: Secondary | ICD-10-CM | POA: Insufficient documentation

## 2021-06-16 NOTE — Therapy (Signed)
?OUTPATIENT PHYSICAL THERAPY LOWER EXTREMITY EVALUATION ? ? ?Patient Name: [name] [name] ?MRN: [id number] ?DOB:[birth year], 71 y.o., male ?Today's Date: 06/16/2021 ? ? PT End of Session - 06/16/21 0919   ? ? Visit Number 1   ? Number of Visits 8   ? Date for PT Re-Evaluation 07/17/21   ? Authorization Type [employer] medicare - no auth needed   ? Progress Note Due on Visit 10   ? PT Start Time 0815   ? PT Stop Time 0900   ? PT Time Calculation (min) 45 min   ? Equipment Utilized During Treatment Other (comment)   personal SPC  ? Activity Tolerance Patient tolerated treatment well   ? Behavior During Therapy [facility] for tasks assessed/performed   ? ?  ?  ? ?  ? ? ?Past Medical History:  ?Diagnosis Date  ? Aortic atherosclerosis ([place]) 10/19/2016  ?  Seen on chest x-ray July 2018  ? Arthritis   ? right knee and hip   ? Cancer [facility])   ? prostate cancer   ? Chronic kidney disease   ? prostate cancer   ? Diabetes mellitus without complication ([place])   ? GERD (gastroesophageal reflux disease)   ? Gout   ? Gout   ? Hypercholesteremia   ? Hyperglycemia   ? Hyperlipidemia   ? Hypertension   ? Sleep apnea   ? Status post THR (total hip replacement) 04/20/13 05/01/2013  ? Right total hip 04/20/2013 Dr. Lydia Guiles implant   ? ?Past Surgical History:  ?Procedure Laterality Date  ? COLONOSCOPY    ? COLONOSCOPY N/A 10/22/2013  ? Procedure: COLONOSCOPY;  Surgeon: Danie Binder, MD;  [facility];  Service: Endoscopy;  Laterality: N/A;  1115  ? HERNIA REPAIR    ? right inguinal hernia repair   ? LUMBAR LAMINECTOMY/DECOMPRESSION MICRODISCECTOMY Right 03/16/2021  ? Procedure: Right Lumbar four-five Laminectomy/foraminotomy;  Surgeon: Eustace Moore, MD;  Location: [place];  Service: Neurosurgery;  Laterality: Right;  ? NASAL SEPTUM SURGERY    ? OTHER SURGICAL HISTORY    ? deviated septum surgery   ? ROBOT ASSISTED LAPAROSCOPIC RADICAL PROSTATECTOMY  04/20/2011  ? Procedure: ROBOTIC ASSISTED LAPAROSCOPIC RADICAL PROSTATECTOMY;  Surgeon:  Malka So, MD;  [facility];  Service: Urology;  Laterality: N/A;  Bilateral lymph node dissection  ? TOTAL HIP ARTHROPLASTY Right 04/20/2013  ? Procedure: TOTAL HIP ARTHROPLASTY;  Surgeon: Carole Civil, MD;  [facility];  Service: Orthopedics;  Laterality: Right;  ? TOTAL KNEE ARTHROPLASTY Right 05/16/2018  ? Procedure: TOTAL KNEE ARTHROPLASTY;  Surgeon: Carole Civil, MD;  [facility];  Service: Orthopedics;  Laterality: Right;  ? ?Patient Active Problem List  ? Diagnosis Date Noted  ? Cellulitis of right knee 05/29/2018  ? Benign essential hypertension 05/22/2018  ? Status post right knee replacement 05/16/18 05/22/2018  ? Constipation due to opioid therapy 05/22/2018  ? Adult BMI 32.0-32.9 kg/sq m 05/22/2018  ? Primary osteoarthritis of knee 05/16/2018  ? Aortic atherosclerosis ([place]) 10/19/2016  ? Status post THR (total hip replacement) 04/20/13 05/01/2013  ? Prediabetes 03/26/2013  ? Hyperlipidemia 03/26/2013  ? Microproteinuria 03/26/2013  ? Gout 03/26/2013  ? Back pain 09/05/2012  ? Prostate cancer ([place]) 04/20/2011  ? ? ?PCP: Kathyrn Drown, MD ? ?REFERRING PROVIDER: Kathyrn Drown, MD ? ?REFERRING DIAG: M79.604,M79.605 (ICD-10-CM) - Pain in both lower extremities R26.81 (ICD-10-CM) - Difficulty standing R29.898 (ICD-10-CM) - Weakness of both lower extremities  ? ?THERAPY  DIAG:  ?Difficulty in walking, not elsewhere classified ? ?Difficulty standing ? ?Muscle weakness (generalized) ? ?ONSET DATE: March 2022 ? ?SUBJECTIVE:  ? ?SUBJECTIVE STATEMENT: ?1 year ago, had really bad pain into right leg. Went to [facility] with MRI (+) for L/S disc with injections to help. Then lumbar surgery in December 2022. "Original pain is gone but inactivity caused some weakness especially in muscles above and below the knee". Getting up and down is the hardest, once up okay. Bending is difficulty but very gradually getting better but wants to speed it up. When lead with left foot, some concern for  knee supporting weight, using SPC. When pushing cart at [place] he feels more stable but at end he is sweating a lot.  Overall some concern for left knee with some minor pains but overall concerned for his strength for safety. States he avoids bad weather and stays home, enjoys going out when weather is nice.  ? ?PERTINENT HISTORY: ?R hip replaced Jan 2015 ?R knee replaced Feb 2020  ?L/S surgery laminectomy/foraminotomy L4-5 Mar 16, 2021 ?HTN - stable with meds ?DM - stable with meds ? ?PAIN:  ?Are you having pain? None at rest, with movement has pain Yes: NPRS scale: 2/10 ?Pain location: left knee ?Pain description: dull ?Aggravating factors: walking, transitioning, colder/wet weather ?Relieving factors: movement when able ? ?PRECAUTIONS: Fall ? ?WEIGHT BEARING RESTRICTIONS No ? ?FALLS:  ?Has patient fallen in last 6 months? No ? ?LIVING ENVIRONMENT: ?Lives with: lives with an adult companion ?Lives in: Mobile home ?Stairs: Yes: External: 6 steps; on left going up ?Has following equipment at home: Single point cane and sock aid; SPC for community use only  ? ?OCCUPATION: not working ? ?PLOF: Independent as of one year ago, today needs help from roommate for cleaning/laundry ? ?PATIENT GOALS to be able to walk and resume normal daily activities "bending, stooping" without pain and get rid of cane ? ? ?OBJECTIVE:  ? ?DIAGNOSTIC FINDINGS: 03/16/21 Right Lumbar four-five Laminectomy/foraminotomy reported under imaging  ? ?PATIENT SURVEYS:  ?None needing ? ?COGNITION: ? Overall cognitive status: Within functional limits for tasks assessed   ?  ?SENSATION: ?WFL for B LE grossly ? ?MUSCLE LENGTH: ?NT today secondary to time ? ?POSTURE:  ?Standing with B LE touching table "for safety" per patient report. Holding R knee flexed mod and L knee min flexed, R LE ER, with rounded shoulder and overall flexed posture ? ?PALPATION: ?TTP at left knee with popping during testing  ? ?LE ROM: ? ?  Lumbar spine quick test forward  flexion limited 50% ? ?Active ROM Right ?06/16/2021 Left ?06/16/2021  ?Hip flexion 95 95  ?Hip extension 0 0  ?Hip abduction 30 20  ?Hip adduction 20 20  ?Hip internal rotation 10 0  ?Hip external rotation 45 20  ?Knee flexion 100 110  ?Knee extension 0 -10  ?Ankle dorsiflexion    ?Ankle plantarflexion    ?Ankle inversion    ?Ankle eversion    ? (Blank rows = not tested) ? ?LE MMT: ? ?MMT Right ?06/16/2021 Left ?06/16/2021  ?Hip flexion 4 4  ?Hip extension 4 4  ?Hip abduction 4 4  ?Hip adduction 4+ 4+  ?Hip internal rotation 4- 4-  ?Hip external rotation 4- 4-  ?Knee flexion 4 4  ?Knee extension 4+ 4-  ?Ankle dorsiflexion    ?Ankle plantarflexion    ?Ankle inversion    ?Ankle eversion    ? (Blank rows = not tested) ? ?LOWER EXTREMITY SPECIAL  TESTS:  ?NT today secondary to focus on functional testing needs ? ?FUNCTIONAL TESTS:  ?5 times sit to stand: 17 seconds ?2 minute walk test: 300 feet  with SPC in hand but not in use, see gait for comments ?Pick up trial - uses right hand support on countertop, then golfers pick up with right leg leg out and reach with left hand ? ?GAIT: ?Distance walked: 300 feet ?Assistive device utilized: Single point cane ?Level of assistance: Complete Independence and Modified independence  walks with and without cane, did 2MWT without cane - trial of cane in left hand verse right ?Comments:  Antalgic patterning with decreased left LE weight bearing, decreased left stride length, trendelenburg patterning B; fair pace ? ? ? ?TODAY'S TREATMENT: ?Evaluation and education for cane use and given HEP  ? ? ?PATIENT EDUCATION:  ?Education details: Discussion on evaluation findings, POC, and HEP with education on core activation for stabilization and cane use for safety ?Person educated: Patient ?Education method: Explanation, Demonstration, Verbal cues, and Handouts ?Education comprehension: verbalized understanding and returned demonstration ? ? ?HOME EXERCISE PROGRAM: ?Access Code: [email or web address] ?URL:  [email or web address] ?Date: 06/16/2021 ?Prepared by: Jerilynn Som ? ?Exercises ?- Supine March  - 2 x daily - 7 x weekly - 2 sets - 10 reps ?- Seated Long Arc Quad  - 2 x daily - 7 x weekly - 2

## 2021-06-19 ENCOUNTER — Encounter (HOSPITAL_COMMUNITY): Payer: Self-pay

## 2021-06-19 ENCOUNTER — Ambulatory Visit (HOSPITAL_COMMUNITY): Payer: Medicare Other

## 2021-06-19 DIAGNOSIS — M79604 Pain in right leg: Secondary | ICD-10-CM | POA: Diagnosis not present

## 2021-06-19 DIAGNOSIS — M6281 Muscle weakness (generalized): Secondary | ICD-10-CM | POA: Diagnosis not present

## 2021-06-19 DIAGNOSIS — R262 Difficulty in walking, not elsewhere classified: Secondary | ICD-10-CM

## 2021-06-19 DIAGNOSIS — R29898 Other symptoms and signs involving the musculoskeletal system: Secondary | ICD-10-CM | POA: Diagnosis not present

## 2021-06-19 DIAGNOSIS — R2681 Unsteadiness on feet: Secondary | ICD-10-CM

## 2021-06-19 DIAGNOSIS — M79605 Pain in left leg: Secondary | ICD-10-CM | POA: Diagnosis not present

## 2021-06-19 NOTE — Therapy (Signed)
?OUTPATIENT PHYSICAL THERAPY LOWER EXTREMITY EVALUATION ? ? ?Patient Name: [name] [name] ?MRN: [id number] ?DOB:27-Oct-1950, 71 y.o., male ?Today's Date: 06/19/2021 ? ? PT End of Session - 06/19/21 1038   ? ? Visit Number 2   ? Number of Visits 8   ? Date for PT Re-Evaluation 07/17/21   ? Authorization Type [employer] medicare - no auth needed   ? Progress Note Due on Visit 10   ? PT Start Time 1035   ? PT Stop Time 1120   ? PT Time Calculation (min) 45 min   ? Equipment Utilized During Treatment --   SPC  ? Activity Tolerance Patient tolerated treatment well   ? Behavior During Therapy [facility] for tasks assessed/performed   ? ?  ?  ? ?  ? ? ? ?Past Medical History:  ?Diagnosis Date  ? Aortic atherosclerosis ([place]) 10/19/2016  ?  Seen on chest x-ray July 2018  ? Arthritis   ? right knee and hip   ? Cancer [facility])   ? prostate cancer   ? Chronic kidney disease   ? prostate cancer   ? Diabetes mellitus without complication ([place])   ? GERD (gastroesophageal reflux disease)   ? Gout   ? Gout   ? Hypercholesteremia   ? Hyperglycemia   ? Hyperlipidemia   ? Hypertension   ? Sleep apnea   ? Status post THR (total hip replacement) 04/20/13 05/01/2013  ? Right total hip 04/20/2013 Dr. Lydia Guiles implant   ? ?Past Surgical History:  ?Procedure Laterality Date  ? COLONOSCOPY    ? COLONOSCOPY N/A 10/22/2013  ? Procedure: COLONOSCOPY;  Surgeon: Danie Binder, MD;  [facility];  Service: Endoscopy;  Laterality: N/A;  1115  ? HERNIA REPAIR    ? right inguinal hernia repair   ? LUMBAR LAMINECTOMY/DECOMPRESSION MICRODISCECTOMY Right 03/16/2021  ? Procedure: Right Lumbar four-five Laminectomy/foraminotomy;  Surgeon: Eustace Moore, MD;  Location: [place];  Service: Neurosurgery;  Laterality: Right;  ? NASAL SEPTUM SURGERY    ? OTHER SURGICAL HISTORY    ? deviated septum surgery   ? ROBOT ASSISTED LAPAROSCOPIC RADICAL PROSTATECTOMY  04/20/2011  ? Procedure: ROBOTIC ASSISTED LAPAROSCOPIC RADICAL PROSTATECTOMY;  Surgeon: Malka So, MD;   [facility];  Service: Urology;  Laterality: N/A;  Bilateral lymph node dissection  ? TOTAL HIP ARTHROPLASTY Right 04/20/2013  ? Procedure: TOTAL HIP ARTHROPLASTY;  Surgeon: Carole Civil, MD;  [facility];  Service: Orthopedics;  Laterality: Right;  ? TOTAL KNEE ARTHROPLASTY Right 05/16/2018  ? Procedure: TOTAL KNEE ARTHROPLASTY;  Surgeon: Carole Civil, MD;  [facility];  Service: Orthopedics;  Laterality: Right;  ? ?Patient Active Problem List  ? Diagnosis Date Noted  ? Cellulitis of right knee 05/29/2018  ? Benign essential hypertension 05/22/2018  ? Status post right knee replacement 05/16/18 05/22/2018  ? Constipation due to opioid therapy 05/22/2018  ? Adult BMI 32.0-32.9 kg/sq m 05/22/2018  ? Primary osteoarthritis of knee 05/16/2018  ? Aortic atherosclerosis ([place]) 10/19/2016  ? Status post THR (total hip replacement) 04/20/13 05/01/2013  ? Prediabetes 03/26/2013  ? Hyperlipidemia 03/26/2013  ? Microproteinuria 03/26/2013  ? Gout 03/26/2013  ? Back pain 09/05/2012  ? Prostate cancer ([place]) 04/20/2011  ? ? ?PCP: Kathyrn Drown, MD ? ?REFERRING PROVIDER: Kathyrn Drown, MD ? ?REFERRING DIAG: M79.604,M79.605 (ICD-10-CM) - Pain in both lower extremities R26.81 (ICD-10-CM) - Difficulty standing R29.898 (ICD-10-CM) - Weakness of both lower extremities  ? ?THERAPY DIAG:  ?  Difficulty in walking, not elsewhere classified ? ?Difficulty standing ? ?Muscle weakness (generalized) ? ?ONSET DATE: March 2022 ? ?SUBJECTIVE:  ? ?SUBJECTIVE STATEMENT: ?Pt reports he has increased pain following walking upon rest.  Pain scale 3/10 Bil knees and shin.  No reports of pain in back today.  Pt stated he's been busy today, already went shopping at [facility], stated he wears knee brace while walking long periods of time. ? ?PAIN:  ?Are you having pain? Yes: NPRS scale: 3/10 ?Pain location: Bil knees ?Pain description: sharp ?Aggravating factors: upon rest following activities ?Relieving factors: sitting  tall ? ? ?PERTINENT HISTORY: ?R hip replaced Jan 2015 ?R knee replaced Feb 2020  ?L/S surgery laminectomy/foraminotomy L4-5 Mar 16, 2021 ?HTN - stable with meds ?DM - stable with meds ? ?PAIN:  ?Are you having pain? None at rest, with movement has pain Yes: NPRS scale: 2/10 ?Pain location: left knee ?Pain description: dull ?Aggravating factors: walking, transitioning, colder/wet weather ?Relieving factors: movement when able ? ?PRECAUTIONS: Fall ? ?WEIGHT BEARING RESTRICTIONS No ? ?FALLS:  ?Has patient fallen in last 6 months? No ? ?LIVING ENVIRONMENT: ?Lives with: lives with an adult companion ?Lives in: Mobile home ?Stairs: Yes: External: 6 steps; on left going up ?Has following equipment at home: Single point cane and sock aid; SPC for community use only  ? ?OCCUPATION: not working ? ?PLOF: Independent as of one year ago, today needs help from roommate for cleaning/laundry ? ?PATIENT GOALS to be able to walk and resume normal daily activities "bending, stooping" without pain and get rid of cane ? ? ?OBJECTIVE:  ? ?DIAGNOSTIC FINDINGS: 03/16/21 Right Lumbar four-five Laminectomy/foraminotomy reported under imaging  ? ?PATIENT SURVEYS:  ?None needing ? ?COGNITION: ? Overall cognitive status: Within functional limits for tasks assessed   ?  ?SENSATION: ?WFL for B LE grossly ? ?MUSCLE LENGTH: ?NT today secondary to time ? ?POSTURE:  ?Standing with B LE touching table "for safety" per patient report. Holding R knee flexed mod and L knee min flexed, R LE ER, with rounded shoulder and overall flexed posture ? ?PALPATION: ?TTP at left knee with popping during testing  ? ?LE ROM: ? ?  Lumbar spine quick test forward flexion limited 50% ? ?Active ROM Right ?06/19/2021 Left ?06/19/2021  ?Hip flexion 95 95  ?Hip extension 0 0  ?Hip abduction 30 20  ?Hip adduction 20 20  ?Hip internal rotation 10 0  ?Hip external rotation 45 20  ?Knee flexion 100 110  ?Knee extension 0 -10  ?Ankle dorsiflexion    ?Ankle plantarflexion    ?Ankle  inversion    ?Ankle eversion    ? (Blank rows = not tested) ? ?LE MMT: ? ?MMT Right ?06/19/2021 Left ?06/19/2021  ?Hip flexion 4 4  ?Hip extension 4 4  ?Hip abduction 4 4  ?Hip adduction 4+ 4+  ?Hip internal rotation 4- 4-  ?Hip external rotation 4- 4-  ?Knee flexion 4 4  ?Knee extension 4+ 4-  ?Ankle dorsiflexion    ?Ankle plantarflexion    ?Ankle inversion    ?Ankle eversion    ? (Blank rows = not tested) ? ?LOWER EXTREMITY SPECIAL TESTS:  ?NT today secondary to focus on functional testing needs ? ?FUNCTIONAL TESTS:  ?5 times sit to stand: 17 seconds ?2 minute walk test: 300 feet  with SPC in hand but not in use, see gait for comments ?Pick up trial - uses right hand support on countertop, then golfers pick up with right leg leg out  and reach with left hand ? ?GAIT: ?Distance walked: 300 feet ?Assistive device utilized: Single point cane ?Level of assistance: Complete Independence and Modified independence  walks with and without cane, did 2MWT without cane - trial of cane in left hand verse right ?Comments:  Antalgic patterning with decreased left LE weight bearing, decreased left stride length, trendelenburg patterning B; fair pace ? ? ? ?TODAY'S TREATMENT: ?06/19/21: ?Supine: bridge 2x 5 5" holds ? Quad set 10x 5" ? Rt knee AROM 4-118 ? Lt knee AROM 12-123 ? Bent knee raise with ab set 10x 5" ?Prone: ? Heel squeeze ? SLR 10x ?Seated: Seated posture ? LAQ ? Diaphragmatic breathing x 1' ? Breathing with ab set exhale x 1' ? [place] 10x RTB x 2  ?Gait training with SPC x 226 with change Rt UE, cueing for sequence ? ? ?Eval: ?Evaluation and education for cane use and given HEP  ? ? ?PATIENT EDUCATION:  ?Education details: Discussion on evaluation findings, POC, and HEP with education on core activation for stabilization and cane use for safety ?Person educated: Patient ?Education method: Explanation, Demonstration, Verbal cues, and Handouts ?Education comprehension: verbalized understanding and returned  demonstration ? ? ?HOME EXERCISE PROGRAM: ?Access Code: [email or web address] ?URL: [email or web address] ?Date: 06/16/2021 ?Prepared by: Jerilynn Som ? ?Exercises ?- Supine March  - 2 x daily - 7 x weekly - 2 sets - 10 rep

## 2021-06-22 ENCOUNTER — Encounter (HOSPITAL_COMMUNITY): Payer: Self-pay | Admitting: Physical Therapy

## 2021-06-22 ENCOUNTER — Other Ambulatory Visit: Payer: Self-pay | Admitting: Family Medicine

## 2021-06-22 ENCOUNTER — Ambulatory Visit (HOSPITAL_COMMUNITY): Payer: Medicare Other | Attending: Family Medicine | Admitting: Physical Therapy

## 2021-06-22 DIAGNOSIS — R2681 Unsteadiness on feet: Secondary | ICD-10-CM | POA: Insufficient documentation

## 2021-06-22 DIAGNOSIS — M6281 Muscle weakness (generalized): Secondary | ICD-10-CM | POA: Insufficient documentation

## 2021-06-22 DIAGNOSIS — R262 Difficulty in walking, not elsewhere classified: Secondary | ICD-10-CM | POA: Diagnosis not present

## 2021-06-22 NOTE — Therapy (Signed)
?OUTPATIENT PHYSICAL THERAPY LOWER EXTREMITY EVALUATION ? ? ?Patient Name: [name] [name] ?MRN: [id number] ?DOB:April 02, 1950, 71 y.o., male ?Today's Date: 06/22/2021 ? ? PT End of Session - 06/22/21 1042   ? ? Visit Number 3   ? Number of Visits 8   ? Date for PT Re-Evaluation 07/17/21   ? Authorization Type UHC medicare - no auth needed   ? Progress Note Due on Visit 10   ? PT Start Time 1035   ? PT Stop Time 1114   ? PT Time Calculation (min) 39 min   ? Equipment Utilized During Treatment --   SPC  ? Activity Tolerance Patient tolerated treatment well   ? Behavior During Therapy [facility] for tasks assessed/performed   ? ?  ?  ? ?  ? ? ? ? ?Past Medical History:  ?Diagnosis Date  ? Aortic atherosclerosis ([place]) 10/19/2016  ?  Seen on chest x-ray July 2018  ? Arthritis   ? right knee and hip   ? Cancer [facility])   ? prostate cancer   ? Chronic kidney disease   ? prostate cancer   ? Diabetes mellitus without complication ([place])   ? GERD (gastroesophageal reflux disease)   ? Gout   ? Gout   ? Hypercholesteremia   ? Hyperglycemia   ? Hyperlipidemia   ? Hypertension   ? Sleep apnea   ? Status post THR (total hip replacement) 04/20/13 05/01/2013  ? Right total hip 04/20/2013 Dr. Lydia Guiles implant   ? ?Past Surgical History:  ?Procedure Laterality Date  ? COLONOSCOPY    ? COLONOSCOPY N/A 10/22/2013  ? Procedure: COLONOSCOPY;  Surgeon: Danie Binder, MD;  [facility];  Service: Endoscopy;  Laterality: N/A;  1115  ? HERNIA REPAIR    ? right inguinal hernia repair   ? LUMBAR LAMINECTOMY/DECOMPRESSION MICRODISCECTOMY Right 03/16/2021  ? Procedure: Right Lumbar four-five Laminectomy/foraminotomy;  Surgeon: Eustace Moore, MD;  Location: [place];  Service: Neurosurgery;  Laterality: Right;  ? NASAL SEPTUM SURGERY    ? OTHER SURGICAL HISTORY    ? deviated septum surgery   ? ROBOT ASSISTED LAPAROSCOPIC RADICAL PROSTATECTOMY  04/20/2011  ? Procedure: ROBOTIC ASSISTED LAPAROSCOPIC RADICAL PROSTATECTOMY;  Surgeon: Malka So, MD;   [facility];  Service: Urology;  Laterality: N/A;  Bilateral lymph node dissection  ? TOTAL HIP ARTHROPLASTY Right 04/20/2013  ? Procedure: TOTAL HIP ARTHROPLASTY;  Surgeon: Carole Civil, MD;  [facility];  Service: Orthopedics;  Laterality: Right;  ? TOTAL KNEE ARTHROPLASTY Right 05/16/2018  ? Procedure: TOTAL KNEE ARTHROPLASTY;  Surgeon: Carole Civil, MD;  [facility];  Service: Orthopedics;  Laterality: Right;  ? ?Patient Active Problem List  ? Diagnosis Date Noted  ? Cellulitis of right knee 05/29/2018  ? Benign essential hypertension 05/22/2018  ? Status post right knee replacement 05/16/18 05/22/2018  ? Constipation due to opioid therapy 05/22/2018  ? Adult BMI 32.0-32.9 kg/sq m 05/22/2018  ? Primary osteoarthritis of knee 05/16/2018  ? Aortic atherosclerosis ([place]) 10/19/2016  ? Status post THR (total hip replacement) 04/20/13 05/01/2013  ? Prediabetes 03/26/2013  ? Hyperlipidemia 03/26/2013  ? Microproteinuria 03/26/2013  ? Gout 03/26/2013  ? Back pain 09/05/2012  ? Prostate cancer ([place]) 04/20/2011  ? ? ?PCP: Kathyrn Drown, MD ? ?REFERRING PROVIDER: Kathyrn Drown, MD ? ?REFERRING DIAG: M79.604,M79.605 (ICD-10-CM) - Pain in both lower extremities R26.81 (ICD-10-CM) - Difficulty standing R29.898 (ICD-10-CM) - Weakness of both lower extremities  ? ?THERAPY  DIAG:  ?Difficulty in walking, not elsewhere classified ? ?Difficulty standing ? ?Muscle weakness (generalized) ? ?ONSET DATE: March 2022 ? ?SUBJECTIVE:  ? ?SUBJECTIVE STATEMENT: ?Patient reports increased pain. Most of pain is around his knees. He has been trying home exercise but they are difficult. He does one set and is "wiped out".  ? ?PAIN:  ?Are you having pain? Yes: NPRS scale: 3/10 ?Pain location: Bil knees ?Pain description: sharp ?Aggravating factors: upon rest following activities ?Relieving factors: sitting tall ? ? ?PERTINENT HISTORY: ?R hip replaced Jan 2015 ?R knee replaced Feb 2020  ?L/S surgery  laminectomy/foraminotomy L4-5 Mar 16, 2021 ?HTN - stable with meds ?DM - stable with meds ? ?PRECAUTIONS: Fall ? ? ? ?OBJECTIVE:  ? ?DIAGNOSTIC FINDINGS: 03/16/21 Right Lumbar four-five Laminectomy/foraminotomy reported under imaging  ? ? ?TODAY'S TREATMENT: ?06/22/21: ?Ab march x20 ?SAQ x15 each ?Bridge 2 x 10 ?Hip abduction/ adduction iso 2 sets 10 x 5" each  ? ?Manual IASTM using thera gun lv 8 to bilateral quad (test retest with hip bridge, less pain afterward) ? ?06/19/21: ?Supine: bridge 2x 5 5" holds ? Quad set 10x 5" ? Rt knee AROM 4-118 ? Lt knee AROM 12-123 ? Bent knee raise with ab set 10x 5" ?Prone: ? Heel squeeze ? SLR 10x ?Seated: Seated posture ? LAQ ? Diaphragmatic breathing x 1' ? Breathing with ab set exhale x 1' ? Paloff 10x RTB x 2  ?Gait training with SPC x 226 with change Rt UE, cueing for sequence ? ? ?Eval: ?Evaluation and education for cane use and given HEP  ? ? ?PATIENT EDUCATION:  ?Education details: on manual intervention, exercise form and function  ?Person educated: Patient ?Education method: Explanation, Demonstration, Verbal cues, and Handouts ?Education comprehension: verbalized understanding and returned demonstration ? ? ?HOME EXERCISE PROGRAM: ?Access Code: [email or web address] ?URL: [email or web address] ?Date: 06/22/2021 ?Prepared by: Josue Hector ? ?Exercises ?- Hooklying Isometric Hip Abduction with Belt  - 2 x daily - 7 x weekly - 2 sets - 10 reps - 5 second hold ?- Supine Hip Adduction Isometric with Ball  - 2 x daily - 7 x weekly - 2 sets - 10 reps - 5 second holdAccess Code: [email or web address] ?URL: [email or web address] ?Date: 06/16/2021 ?Prepared by: Jerilynn Som ? ?Exercises ?- Supine March  - 2 x daily - 7 x weekly - 2 sets - 10 reps ?- Seated Long Arc Quad  - 2 x daily - 7 x weekly - 2 sets - 10 reps ? ?06/19/21: quad set ? Bridge ? Isometric core  ? ?ASSESSMENT: ? ?CLINICAL IMPRESSION: ?Patient noting increased thigh pain with most all activity. Introduced  manual intervention to reduce quad restriction/ address pain. Patient noting decreased pain following and improved ease with strengthening exercises. Added isometric hip abduction and adduction for LE strength progressions. Issued HEP handout. Patient will continue to benefit from skilled therapy services to reduce remaining deficits and improve functional ability.  ? ? ?OBJECTIVE IMPAIRMENTS Abnormal gait, decreased activity tolerance, decreased balance, decreased coordination, decreased endurance, decreased mobility, difficulty walking, decreased ROM, decreased strength, hypomobility, impaired flexibility, improper body mechanics, postural dysfunction, and pain.  ? ?ACTIVITY LIMITATIONS cleaning, community activity, laundry, yard work, shopping, and yard work.  ? ?PERSONAL FACTORS Fitness, Time since onset of injury/illness/exacerbation, and 3+ comorbidities: DM, HTN, L/S dysfuction and surgery Dec 2022, and prior R TKA, R THA  are also affecting patient's functional outcome.  ? ? ?REHAB POTENTIAL: Good ? ?CLINICAL DECISION MAKING: Evolving/moderate complexity ? ?EVALUATION COMPLEXITY: Moderate ? ? ?  GOALS: ?Goals reviewed with patient? No ? ?SHORT TERM GOALS: Target date: 07/06/2021 ? ?Patient will be independent with HEP  ?Baseline: new today ?Goal status: Ongoing ? ?2.  Patient will be able to demonstrate gross B LE strength to at least 4/5 for improved strength ?Baseline: 06/16/21 see objective, B hip rotation 4-/5 and L knee ext 4-/5 ?Goal status: Ongoing ? ?3.  Patient will be able to improve postural status with equal B LE weight bearing and neutral alignment.  ?Baseline: 06/16/21 - holding R knee flexed and R LE ER, overall flexed poster ?Goal status: Ongoing ? ? ? ?LONG TERM GOALS: Target date: 07/20/2021 ? ?Patient will be able to demonstrate gross B LE strength to at least 4+/5 for improved strength ?Baseline: 06/16/21 see objective, B hip rotation 4-/5 and L knee ext 4-/5 ?Goal status: Ongoing ? ?2.  Patient  will be able to demonstrate improved 2MWT for at least 400 feet with equal B LE weight bearing and stride length.  ?Baseline: 06/16/21 = 300 feet today ?Goal status: Ongoing ? ?3.  Patient will be able to demonstrate a 5 tim

## 2021-06-25 ENCOUNTER — Encounter (HOSPITAL_COMMUNITY): Payer: Self-pay

## 2021-06-25 ENCOUNTER — Ambulatory Visit (HOSPITAL_COMMUNITY): Payer: Medicare Other

## 2021-06-25 DIAGNOSIS — M6281 Muscle weakness (generalized): Secondary | ICD-10-CM

## 2021-06-25 DIAGNOSIS — R262 Difficulty in walking, not elsewhere classified: Secondary | ICD-10-CM

## 2021-06-25 DIAGNOSIS — R2681 Unsteadiness on feet: Secondary | ICD-10-CM | POA: Diagnosis not present

## 2021-06-25 NOTE — Therapy (Signed)
?OUTPATIENT PHYSICAL THERAPY LOWER EXTREMITY EVALUATION ? ? ?Patient Name: [name] [name] ?MRN: [id number] ?DOB:07-14-1950, 71 y.o., male ?Today's Date: 06/25/2021 ? ? PT End of Session - 06/25/21 9675   ? ? Visit Number 4   ? Number of Visits 8   ? Date for PT Re-Evaluation 07/17/21   ? Authorization Type [employer]HC medicare - no auth needed   ? Progress Note Due on Visit 10   ? PT Start Time 0815   ? PT Stop Time 0855   ? PT Time Calculation (min) 40 min   ? Equipment Utilized During Treatment --   SPC  ? Activity Tolerance Patient tolerated treatment well   ? Behavior During Therapy [facility] for tasks assessed/performed   ? ?  ?  ? ?  ? ? ? ? ?Past Medical History:  ?Diagnosis Date  ? Aortic atherosclerosis ([place]) 10/19/2016  ?  Seen on chest x-ray July 2018  ? Arthritis   ? right knee and hip   ? Cancer [facility])   ? prostate cancer   ? Chronic kidney disease   ? prostate cancer   ? Diabetes mellitus without complication ([place])   ? GERD (gastroesophageal reflux disease)   ? Gout   ? Gout   ? Hypercholesteremia   ? Hyperglycemia   ? Hyperlipidemia   ? Hypertension   ? Sleep apnea   ? Status post THR (total hip replacement) 04/20/13 05/01/2013  ? Right total hip 04/20/2013 Dr. Lydia Guiles implant   ? ?Past Surgical History:  ?Procedure Laterality Date  ? COLONOSCOPY    ? COLONOSCOPY N/A 10/22/2013  ? Procedure: COLONOSCOPY;  Surgeon: Danie Binder, MD;  [facility];  Service: Endoscopy;  Laterality: N/A;  1115  ? HERNIA REPAIR    ? right inguinal hernia repair   ? LUMBAR LAMINECTOMY/DECOMPRESSION MICRODISCECTOMY Right 03/16/2021  ? Procedure: Right Lumbar four-five Laminectomy/foraminotomy;  Surgeon: Eustace Moore, MD;  Location: [place];  Service: Neurosurgery;  Laterality: Right;  ? NASAL SEPTUM SURGERY    ? OTHER SURGICAL HISTORY    ? deviated septum surgery   ? ROBOT ASSISTED LAPAROSCOPIC RADICAL PROSTATECTOMY  04/20/2011  ? Procedure: ROBOTIC ASSISTED LAPAROSCOPIC RADICAL PROSTATECTOMY;  Surgeon: Malka So, MD;   [facility];  Service: Urology;  Laterality: N/A;  Bilateral lymph node dissection  ? TOTAL HIP ARTHROPLASTY Right 04/20/2013  ? Procedure: TOTAL HIP ARTHROPLASTY;  Surgeon: Carole Civil, MD;  [facility];  Service: Orthopedics;  Laterality: Right;  ? TOTAL KNEE ARTHROPLASTY Right 05/16/2018  ? Procedure: TOTAL KNEE ARTHROPLASTY;  Surgeon: Carole Civil, MD;  [facility];  Service: Orthopedics;  Laterality: Right;  ? ?Patient Active Problem List  ? Diagnosis Date Noted  ? Cellulitis of right knee 05/29/2018  ? Benign essential hypertension 05/22/2018  ? Status post right knee replacement 05/16/18 05/22/2018  ? Constipation due to opioid therapy 05/22/2018  ? Adult BMI 32.0-32.9 kg/sq m 05/22/2018  ? Primary osteoarthritis of knee 05/16/2018  ? Aortic atherosclerosis ([place]) 10/19/2016  ? Status post THR (total hip replacement) 04/20/13 05/01/2013  ? Prediabetes 03/26/2013  ? Hyperlipidemia 03/26/2013  ? Microproteinuria 03/26/2013  ? Gout 03/26/2013  ? Back pain 09/05/2012  ? Prostate cancer ([place]) 04/20/2011  ? ? ?PCP: Kathyrn Drown, MD ? ?REFERRING PROVIDER: Kathyrn Drown, MD ? ?REFERRING DIAG: M79.604,M79.605 (ICD-10-CM) - Pain in both lower extremities R26.81 (ICD-10-CM) - Difficulty standing R29.898 (ICD-10-CM) - Weakness of both lower extremities  ? ?THERAPY  DIAG:  ?Difficulty in walking, not elsewhere classified ? ?Difficulty standing ? ?Muscle weakness (generalized) ? ?ONSET DATE: March 2022 ? ?SUBJECTIVE:  ? ?SUBJECTIVE STATEMENT: ?Patient continues to have increased pain from activity. Did have good response to massage gun. He reports increased pain second day after exercise up to 6-7/10 that lingers to a third day a bit.  ? ?PAIN:  ?Are you having pain? Yes: NPRS scale: 4/10 ?Pain location: Bil knees ?Pain description: sharp ?Aggravating factors: upon rest following activities ?Relieving factors: sitting tall ? ? ?PERTINENT HISTORY: ?R hip replaced Jan 2015 ?R knee replaced Feb  2020  ?L/S surgery laminectomy/foraminotomy L4-5 Mar 16, 2021 ?HTN - stable with meds ?DM - stable with meds ? ?PRECAUTIONS: Fall ? ? ? ?OBJECTIVE:  ? ?DIAGNOSTIC FINDINGS: 03/16/21 Right Lumbar four-five Laminectomy/foraminotomy reported under imaging  ? ? ?TODAY'S TREATMENT: ?06/25/21: ?Supine =  ?-Ab march x20 cue for core activation without holding breath ?-SAQ x10 each ? - Bridge 2 x 10  cue for core and buttock activation and feet pushing down ? - trial of modified thomas stretch 2 x 30 second holds B  ? - trial of manual SLR hamstring stretch 2 x 30 second hold B with contract relax at initiation of stretch ?Seated = Hip abduction/ adduction iso 2 sets 10 x 5" each  ? ?Manual IASTM using thera gun lv 8 to bilateral quad (test retest with hip bridge, less pain afterward) ? ? ? ?06/22/21: ?Ab march x20 ?SAQ x15 each ?Bridge 2 x 10 ?Hip abduction/ adduction iso 2 sets 10 x 5" each  ? ?Manual IASTM using thera gun lv 8 to bilateral quad (test retest with hip bridge, less pain afterward) ? ?06/19/21: ?Supine: bridge 2x 5 5" holds ? Quad set 10x 5" ? Rt knee AROM 4-118 ? Lt knee AROM 12-123 ? Bent knee raise with ab set 10x 5" ?Prone: ? Heel squeeze ? SLR 10x ?Seated: Seated posture ? LAQ ? Diaphragmatic breathing x 1' ? Breathing with ab set exhale x 1' ? Paloff 10x RTB x 2  ?Gait training with SPC x 226 with change Rt UE, cueing for sequence ? ? ?Eval: ?Evaluation and education for cane use and given HEP  ? ? ?PATIENT EDUCATION:  ?Education details: on DOMS, pain relief with heat or topicals, massage use of tennis ball on thighs, and concept of possible overload and trial slight less reps ?Person educated: Patient ?Education method: Explanation, Demonstration, Verbal cues, and Handouts ?Education comprehension: verbalized understanding and returned demonstration ? ? ?HOME EXERCISE PROGRAM: ?06/25/21: all HEP exercises held the same, added stretching ? - Modified Thomas Stretch  - 2 x daily - 7 x weekly - 1 sets - 2-3  reps - 30 seconds hold ? ?Access Code: [email or web address] ?URL: [email or web address] ?Date: 06/22/2021 ?Prepared by: Josue Hector ? ?Exercises ?- Hooklying Isometric Hip Abduction with Belt  - 2 x daily - 7 x weekly - 2 sets - 10 reps - 5 second hold ?- Supine Hip Adduction Isometric with Ball  - 2 x daily - 7 x weekly - 2 sets - 10 reps - 5 second holdAccess Code: [email or web address] ?URL: [email or web address] ?Date: 06/16/2021 ?Prepared by: Jerilynn Som ? ?Exercises ?- Supine March  - 2 x daily - 7 x weekly - 2 sets - 10 reps ?- Seated Long Arc Quad  - 2 x daily - 7 x weekly - 2 sets - 10 reps ? ?06/19/21: quad set ? Bridge ? Isometric core  ? ? ?ASSESSMENT: ? ?  CLINICAL IMPRESSION: ?Today's session focused on continued repeat of exercises focusing on cueing proximal muscle activation to decrease distal muscle irritation.  Main patient complaint of increased pain seems to be DOMS effect and discussion of volume of activities to slightly less then build back up trialed as well today.  Additionally, trial of B LE stretching given for post activity with fair improvement, however patient showing guarding and difficulty relaxing.  Patient will continue to benefit from skilled therapy services to reduce remaining deficits and improve functional ability.  ? ? ?OBJECTIVE IMPAIRMENTS Abnormal gait, decreased activity tolerance, decreased balance, decreased coordination, decreased endurance, decreased mobility, difficulty walking, decreased ROM, decreased strength, hypomobility, impaired flexibility, improper body mechanics, postural dysfunction, and pain.  ? ?ACTIVITY LIMITATIONS cleaning, community activity, laundry, yard work, shopping, and yard work.  ? ?PERSONAL FACTORS Fitness, Time since onset of injury/illness/exacerbation, and 3+ comorbidities: DM, HTN, L/S dysfuction and surgery Dec 2022, and prior R TKA, R THA  are also affecting patient's functional outcome.  ? ? ?REHAB POTENTIAL: Good ? ?CLINICAL  DECISION MAKING: Evolving/moderate complexity ? ?EVALUATION COMPLEXITY: Moderate ? ? ?GOALS: ?Goals reviewed with patient? No ? ?SHORT TERM GOALS: Target date: 07/09/2021 ? ?Patient will be independent with

## 2021-06-26 ENCOUNTER — Encounter: Payer: Self-pay | Admitting: Family Medicine

## 2021-06-29 NOTE — Telephone Encounter (Signed)
Nurses ? ?He may continue gabapentin at nighttime to help him ? ?As for the discomfort in the legs we can utilize a short acting anti-inflammatory that can be taken twice daily as needed but it does not get along with meloxicam.  In other words meloxicam is a 24-hour anti-inflammatory.  If we utilize something different we would have to stop meloxicam. ? ?Alternative choices increasing the dose of meloxicam from 7.5 mg to a maximum of 15 mg once a day ? ?Please see what Pilar Plate would like to do then we move forward from there thank you ?

## 2021-06-30 MED ORDER — DICLOFENAC SODIUM 75 MG PO TBEC: DELAYED_RELEASE_TABLET | ORAL | 1 refills | Status: DC

## 2021-06-30 NOTE — Telephone Encounter (Signed)
Hi Pilar Plate ? ?You may continue the gabapentin at nighttime as previously discussed. ? ?As for the daytime I would recommend stopping meloxicam. ?Instead I would recommend diclofenac 1 tablet twice daily as needed to help with musculoskeletal pain. ?You could utilize 1 of these approximately 1 hour before your physical therapy-hopefully that will help to some degree with the leg pain you are having.  I imagine some of the leg pain is just from the amount of muscle use they are putting you through during physical therapy. ? ?Hopefully this will help.  We will send this into your pharmacy.  Feel free to give [place] an update within the next several weeks on how this is doing. ? ?This was sent to [facility].  If you want future prescription sent to your mail order let us know thank you-Dr. [name] ?

## 2021-07-01 ENCOUNTER — Encounter (HOSPITAL_COMMUNITY): Payer: Medicare Other | Admitting: Physical Therapy

## 2021-07-07 ENCOUNTER — Encounter (HOSPITAL_COMMUNITY): Payer: Medicare Other

## 2021-07-07 DIAGNOSIS — M6281 Muscle weakness (generalized): Secondary | ICD-10-CM

## 2021-07-07 DIAGNOSIS — R2681 Unsteadiness on feet: Secondary | ICD-10-CM

## 2021-07-07 DIAGNOSIS — R262 Difficulty in walking, not elsewhere classified: Secondary | ICD-10-CM

## 2021-07-10 ENCOUNTER — Encounter (HOSPITAL_COMMUNITY): Payer: Medicare Other

## 2021-07-13 ENCOUNTER — Encounter (HOSPITAL_COMMUNITY): Payer: Self-pay

## 2021-07-13 ENCOUNTER — Ambulatory Visit (HOSPITAL_COMMUNITY): Payer: Medicare Other

## 2021-07-13 DIAGNOSIS — R2681 Unsteadiness on feet: Secondary | ICD-10-CM | POA: Diagnosis not present

## 2021-07-13 DIAGNOSIS — M6281 Muscle weakness (generalized): Secondary | ICD-10-CM | POA: Diagnosis not present

## 2021-07-13 DIAGNOSIS — R262 Difficulty in walking, not elsewhere classified: Secondary | ICD-10-CM

## 2021-07-13 NOTE — Therapy (Signed)
?OUTPATIENT PHYSICAL THERAPY LOWER EXTREMITY EVALUATION ? ? ?Patient Name: [name] [name] ?MRN: [id number] ?DOB:06/28/1950, 71 y.o., male ?Today's Date: 07/13/2021 ? ? PT End of Session - 07/13/21 3212   ? ? Visit Number 5   ? Number of Visits 8   ? Date for PT Re-Evaluation 07/17/21   ? Authorization Type [employer]HC medicare - no auth needed   ? Progress Note Due on Visit 10   ? PT Start Time 0815   ? PT Stop Time 0855   ? PT Time Calculation (min) 40 min   ? Equipment Utilized During Treatment --   SPC  ? Activity Tolerance Patient tolerated treatment well   ? Behavior During Therapy [facility] for tasks assessed/performed   ? ?  ?  ? ?  ? ? ? ? ?Past Medical History:  ?Diagnosis Date  ? Aortic atherosclerosis ([place]) 10/19/2016  ?  Seen on chest x-ray July 2018  ? Arthritis   ? right knee and hip   ? Cancer [facility])   ? prostate cancer   ? Chronic kidney disease   ? prostate cancer   ? Diabetes mellitus without complication ([place])   ? GERD (gastroesophageal reflux disease)   ? Gout   ? Gout   ? Hypercholesteremia   ? Hyperglycemia   ? Hyperlipidemia   ? Hypertension   ? Sleep apnea   ? Status post THR (total hip replacement) 04/20/13 05/01/2013  ? Right total hip 04/20/2013 Dr. Lydia Guiles implant   ? ?Past Surgical History:  ?Procedure Laterality Date  ? COLONOSCOPY    ? COLONOSCOPY N/A 10/22/2013  ? Procedure: COLONOSCOPY;  Surgeon: Danie Binder, MD;  [facility];  Service: Endoscopy;  Laterality: N/A;  1115  ? HERNIA REPAIR    ? right inguinal hernia repair   ? LUMBAR LAMINECTOMY/DECOMPRESSION MICRODISCECTOMY Right 03/16/2021  ? Procedure: Right Lumbar four-five Laminectomy/foraminotomy;  Surgeon: Eustace Moore, MD;  Location: Campus;  Service: Neurosurgery;  Laterality: Right;  ? NASAL SEPTUM SURGERY    ? OTHER SURGICAL HISTORY    ? deviated septum surgery   ? ROBOT ASSISTED LAPAROSCOPIC RADICAL PROSTATECTOMY  04/20/2011  ? Procedure: ROBOTIC ASSISTED LAPAROSCOPIC RADICAL PROSTATECTOMY;  Surgeon: Malka So, MD;   [facility];  Service: Urology;  Laterality: N/A;  Bilateral lymph node dissection  ? TOTAL HIP ARTHROPLASTY Right 04/20/2013  ? Procedure: TOTAL HIP ARTHROPLASTY;  Surgeon: Carole Civil, MD;  Location: [facility];  Service: Orthopedics;  Laterality: Right;  ? TOTAL KNEE ARTHROPLASTY Right 05/16/2018  ? Procedure: TOTAL KNEE ARTHROPLASTY;  Surgeon: Carole Civil, MD;  Location: [facility];  Service: Orthopedics;  Laterality: Right;  ? ?Patient Active Problem List  ? Diagnosis Date Noted  ? Cellulitis of right knee 05/29/2018  ? Benign essential hypertension 05/22/2018  ? Status post right knee replacement 05/16/18 05/22/2018  ? Constipation due to opioid therapy 05/22/2018  ? Adult BMI 32.0-32.9 kg/sq m 05/22/2018  ? Primary osteoarthritis of knee 05/16/2018  ? Aortic atherosclerosis ([place]) 10/19/2016  ? Status post THR (total hip replacement) 04/20/13 05/01/2013  ? Prediabetes 03/26/2013  ? Hyperlipidemia 03/26/2013  ? Microproteinuria 03/26/2013  ? Gout 03/26/2013  ? Back pain 09/05/2012  ? Prostate cancer ([place]) 04/20/2011  ? ? ?PCP: Kathyrn Drown, MD ? ?REFERRING PROVIDER: Kathyrn Drown, MD ? ?REFERRING DIAG: M79.604,M79.605 (ICD-10-CM) - Pain in both lower extremities R26.81 (ICD-10-CM) - Difficulty standing R29.898 (ICD-10-CM) - Weakness of both lower extremities  ? ?THERAPY  DIAG:  ?Difficulty in walking, not elsewhere classified ? ?Difficulty standing ? ?Muscle weakness (generalized) ? ?ONSET DATE: March 2022 ? ?SUBJECTIVE:  ? ?SUBJECTIVE STATEMENT: ?Patient reports he was out of town and drove up to [place] with some knee stiffness then.  Over last few days states pain present around right knee cap.  MD gave new rx for pain meds for before therapy. Given gaba for before night. Reports HEP as able while gone, reports he feels some improvement and was able to get into garden which he hadn't been able to do for last year.  ? ?PAIN:  ?Are you having pain? Yes: NPRS scale: 1/10 ?Pain location: Bil knees ?Pain  description: sharp ?Aggravating factors: upon rest following activities ?Relieving factors: sitting tall, new pain relievers from MD ? ? ?PERTINENT HISTORY: ?R hip replaced Jan 2015 ?R knee replaced Feb 2020  ?L/S surgery laminectomy/foraminotomy L4-5 Mar 16, 2021 ?HTN - stable with meds ?DM - stable with meds ? ?PRECAUTIONS: Fall ? ? ? ?OBJECTIVE:  ? ?DIAGNOSTIC FINDINGS: 03/16/21 Right Lumbar four-five Laminectomy/foraminotomy reported under imaging  ? ? ?TODAY'S TREATMENT: ?07/13/21:   in this order  ? Supine =  ?-Hip abduction verse green band 1 sets x 10 reps ?-Hip abduction with green band hold iso with then 1 set x 10 reps bridge ?-Hip adduction iso 1 sets 10 x 5" each  ?-Hip adduction iso hold with then 1 set x 10 reps bridge ?-Ab march x20 cue for core activation without holding breath ?-Quad opening modified thomas stretch 1 x 30 second holds B  ? ?Standing at counter top=  ?- Tall posture x 60 seconds with cueing for gluteal activation and core activation ? -Balance narrow BOX x 30 seconds eyes open, x 10 seconds eyes closed  ? -Balance modified tandem L x 30 seconds eyes open, x 10 seconds eyes closed  ? -Balance modified tandem R x 30 seconds eyes open, x 10 seconds eyes closed  ?-Side step and return at countertop x 20  ?-Back step and return at countertop x 20  ? ?Supine= ?   -SLR hamstring stretch 2 x 30 second hold B ? - Manual IASTM using thera gun lv 8 to bilateral quad ?   -Quad opening modified thomas stretch 1 x 30 second holds B  ? ? ?06/25/21: ?Supine =  ?-Ab march x20 cue for core activation without holding breath ?-SAQ x10 each ? - Bridge 2 x 10  cue for core and buttock activation and feet pushing down ? - trial of modified thomas stretch 2 x 30 second holds B  ? - trial of manual SLR hamstring stretch 2 x 30 second hold B with contract relax at initiation of stretch ?Seated = Hip abduction/ adduction iso 2 sets 10 x 5" each  ? ?Manual IASTM using thera gun lv 8 to bilateral quad (test retest  with hip bridge, less pain afterward) ? ? ? ?06/22/21: ?Ab march x20 ?SAQ x15 each ?Bridge 2 x 10 ?Hip abduction/ adduction iso 2 sets 10 x 5" each  ? ?Manual IASTM using thera gun lv 8 to bilateral quad (test retest with hip bridge, less pain afterward) ? ?06/19/21: ?Supine: bridge 2x 5 5" holds ? Quad set 10x 5" ? Rt knee AROM 4-118 ? Lt knee AROM 12-123 ? Bent knee raise with ab set 10x 5" ?Prone: ? Heel squeeze ? SLR 10x ?Seated: Seated posture ? LAQ ? Diaphragmatic breathing x 1' ? Breathing with ab set exhale x  1' ? Paloff 10x RTB x 2  ?Gait training with SPC x 226 with change Rt UE, cueing for sequence ? ? ?Eval: ?Evaluation and education for cane use and given HEP  ? ? ?PATIENT EDUCATION:  ?Education details: on DOMS, pain relief with heat or topicals, massage use of tennis ball on thighs, and concept of possible overload and trial slight less reps ?Person educated: Patient ?Education method: Explanation, Demonstration, Verbal cues, and Handouts ?Education comprehension: verbalized understanding and returned demonstration ? ? ?HOME EXERCISE PROGRAM: ?Updated 07/13/21 ?Access Code: [email or web address] ?URL: [email or web address] ?Date: 07/13/2021 ?Prepared by: Jerilynn Som ? ?Exercises ?- Hooklying Isometric Hip Abduction with Belt  - 2 x daily - 7 x weekly - 2 sets - 10 reps - 5 second hold ?- Supine Hip Adduction Isometric with Ball  - 2 x daily - 7 x weekly - 2 sets - 10 reps - 5 second hold ?- Modified Thomas Stretch  - 2 x daily - 7 x weekly - 1 sets - 2-3 reps - 30 seconds hold ?- Standing Tandem Balance with Counter Support  - 1 x daily - 7 x weekly - 1 sets - 2 reps - 30 seconds hold ?- Standing Tandem Balance with Counter Support (Mirrored)  - 1 x daily - 7 x weekly - 1 sets - 2 reps - 30 seconds hold ?- Side Stepping with Counter Support  - 1 x daily - 7 x weekly - 2 sets - 10 reps ? ? ?ASSESSMENT: ? ?CLINICAL IMPRESSION: ?Today's session focused on overall review of strengthening and building  functional support to progress prior activities.  Demonstrating overall good body awareness, able to do well with new balance activities.  Overall, patient stated decreased symptoms during session support

## 2021-07-16 ENCOUNTER — Ambulatory Visit (HOSPITAL_COMMUNITY): Payer: Medicare Other | Admitting: Physical Therapy

## 2021-07-16 DIAGNOSIS — M6281 Muscle weakness (generalized): Secondary | ICD-10-CM | POA: Diagnosis not present

## 2021-07-16 DIAGNOSIS — R2681 Unsteadiness on feet: Secondary | ICD-10-CM | POA: Diagnosis not present

## 2021-07-16 DIAGNOSIS — R262 Difficulty in walking, not elsewhere classified: Secondary | ICD-10-CM

## 2021-07-16 NOTE — Therapy (Addendum)
?OUTPATIENT PHYSICAL THERAPY LOWER EXTREMITY RE-EVALUATION ? ?Progress Note ?Reporting Period 06/16/21 to 07/16/21 ? ?See note below for Objective Data and Assessment of Progress/Goals.  ? ?  ?Patient Name: [name] [name] ?MRN: [id number] ?DOB:02/10/1951, 71 y.o., male ?Today's Date: 07/16/2021 ? ? PT End of Session - 07/16/21 1109   ? ? Visit Number 6   ? Number of Visits 14   ? Date for PT Re-Evaluation 08/13/21   ? Authorization Type UHC medicare - no auth needed   ? Progress Note Due on Visit 16   ? PT Start Time 8676   ? PT Stop Time 1000   ? PT Time Calculation (min) 40 min   ? Equipment Utilized During Treatment --   SPC  ? Activity Tolerance Patient tolerated treatment well   ? Behavior During Therapy [facility] for tasks assessed/performed   ? ?  ?  ? ?  ? ? ? ? ? ?Past Medical History:  ?Diagnosis Date  ? Aortic atherosclerosis ([place]) 10/19/2016  ?  Seen on chest x-ray July 2018  ? Arthritis   ? right knee and hip   ? Cancer [facility])   ? prostate cancer   ? Chronic kidney disease   ? prostate cancer   ? Diabetes mellitus without complication ([place])   ? GERD (gastroesophageal reflux disease)   ? Gout   ? Gout   ? Hypercholesteremia   ? Hyperglycemia   ? Hyperlipidemia   ? Hypertension   ? Sleep apnea   ? Status post THR (total hip replacement) 04/20/13 05/01/2013  ? Right total hip 04/20/2013 Dr. Lydia Guiles implant   ? ?Past Surgical History:  ?Procedure Laterality Date  ? COLONOSCOPY    ? COLONOSCOPY N/A 10/22/2013  ? Procedure: COLONOSCOPY;  Surgeon: Danie Binder, MD;  [facility];  Service: Endoscopy;  Laterality: N/A;  1115  ? HERNIA REPAIR    ? right inguinal hernia repair   ? LUMBAR LAMINECTOMY/DECOMPRESSION MICRODISCECTOMY Right 03/16/2021  ? Procedure: Right Lumbar four-five Laminectomy/foraminotomy;  Surgeon: Eustace Moore, MD;  Location: [place];  Service: Neurosurgery;  Laterality: Right;  ? NASAL SEPTUM SURGERY    ? OTHER SURGICAL HISTORY    ? deviated septum surgery   ? ROBOT ASSISTED  LAPAROSCOPIC RADICAL PROSTATECTOMY  04/20/2011  ? Procedure: ROBOTIC ASSISTED LAPAROSCOPIC RADICAL PROSTATECTOMY;  Surgeon: Malka So, MD;  [facility];  Service: Urology;  Laterality: N/A;  Bilateral lymph node dissection  ? TOTAL HIP ARTHROPLASTY Right 04/20/2013  ? Procedure: TOTAL HIP ARTHROPLASTY;  Surgeon: Carole Civil, MD;  [facility];  Service: Orthopedics;  Laterality: Right;  ? TOTAL KNEE ARTHROPLASTY Right 05/16/2018  ? Procedure: TOTAL KNEE ARTHROPLASTY;  Surgeon: Carole Civil, MD;  Location: [facility];  Service: Orthopedics;  Laterality: Right;  ? ?Patient Active Problem List  ? Diagnosis Date Noted  ? Cellulitis of right knee 05/29/2018  ? Benign essential hypertension 05/22/2018  ? Status post right knee replacement 05/16/18 05/22/2018  ? Constipation due to opioid therapy 05/22/2018  ? Adult BMI 32.0-32.9 kg/sq m 05/22/2018  ? Primary osteoarthritis of knee 05/16/2018  ? Aortic atherosclerosis ([place]) 10/19/2016  ? Status post THR (total hip replacement) 04/20/13 05/01/2013  ? Prediabetes 03/26/2013  ? Hyperlipidemia 03/26/2013  ? Microproteinuria 03/26/2013  ? Gout 03/26/2013  ? Back pain 09/05/2012  ? Prostate cancer ([place]) 04/20/2011  ? ? ?PCP: Kathyrn Drown, MD ? ?REFERRING PROVIDER: Kathyrn Drown, MD ? ?REFERRING DIAG: M79.604,M79.605 (ICD-10-CM) -  Pain in both lower extremities R26.81 (ICD-10-CM) - Difficulty standing R29.898 (ICD-10-CM) - Weakness of both lower extremities  ? ?THERAPY DIAG:  ?Difficulty in walking, not elsewhere classified ? ?Difficulty standing ? ?Muscle weakness (generalized) ? ?ONSET DATE: March 2022 ? ?SUBJECTIVE:  ? ?SUBJECTIVE STATEMENT: ?Patient states he is still having a lot of pain.  States he upped his gabapentin and scheduled an appt with Dr Aline Brochure for May 8th regarding his joint pain and continued reduced function.  States he misses being able to work in his garden.  Reports he feels he is 30% improved from when he began therapy and continues  to complete his HEP 3X day.  States it takes about 30 minutes to complete his exercises as he needs to take breaks.  States he feels like his Rt knee is going to give away at times.  Pt also reports he feel Tuesday night as he was reaching for something, falling backward.  States he did not hurt himself by the fall. ? ?PAIN:  ?Are you having pain? Yes: NPRS scale: 3/10 ?Pain location: Bil knees and LE's ?Pain description: sharp ?Aggravating factors: upon rest following activities ?Relieving factors: sitting tall, new pain relievers from MD ? ? ?PERTINENT HISTORY: ?R hip replaced Jan 2015 ?R knee replaced Feb 2020  ?L/S surgery laminectomy/foraminotomy L4-5 Mar 16, 2021 ?HTN - stable with meds ?DM - stable with meds ? ?PRECAUTIONS: Fall ? ? ? ?OBJECTIVE:  ? ?DIAGNOSTIC FINDINGS: 03/16/21 Right Lumbar four-five Laminectomy/foraminotomy reported under imaging  ? ?LE ROM: ?  ?                      Lumbar spine quick test forward flexion limited 50% at evaluation ?  ?Active ROM Right ?06/16/21 Rt ?07/16/21 Left ?06/16/21 Lt ?07/16/21  ?Hip flexion 95 95 95 95  ?Hip extension 0 0 0 0  ?Hip abduction '30 30 20 25  '$ ?Hip adduction '20 20 20 20  '$ ?Hip internal rotation 10 10 0 0  ?Hip external rotation 45 45 20 20  ?Knee flexion 100 120 110 120  ?Knee extension 0 0 -10 -5  ?Ankle dorsiflexion        ?Ankle plantarflexion        ?Ankle inversion        ?Ankle eversion        ? (Blank rows = not tested) ?  ?LE MMT: ?  ?MMT Right ?06/16/21 Rt ?07/16/21 Left ?06/16/21 Lt ?07/16/21  ?Hip flexion 4 4- with pain 4 4  ?Hip extension '4 4 4 4  '$ ?Hip abduction 4 3- with pain 4 3+ with pain ?  ?Hip adduction 4+ 4 with pain 4+ 4 with pain  ?Hip internal rotation 4- 4-with pain 4- 4-with pain  ?Hip external rotation 4- 4-with pain 4- 4-with pain  ?Knee flexion 4 4+ 4 4+  ?Knee extension 4+ 5 4- 5  ?Ankle dorsiflexion        ?Ankle plantarflexion        ?Ankle inversion        ?Ankle eversion        ? (Blank rows = not tested) ?  ?FUNCTIONAL TESTS:  ?5  times sit to stand: 4/27: 10.7 seconds, at evaluation 17 seconds ?2 minute walk test: 4/27: no AD 338 feet, at evaluation 300 feet  with SPC in hand but not in use, see gait for comments ? ?Pick up trial - today and at evaluation: uses right hand support on countertop, then  golfers pick up with right leg leg out and reach with left hand ?  ?Gait Comments:  Antalgic patterning with decreased left LE weight bearing, decreased left stride length, trendelenburg patterning B; fair pace ?  ?  ?  ? ? ?TODAY'S TREATMENT: ?07/16/21 ?Re-evaluation; test measures and goal review (see above for updates) ? ?07/13/21:   in this order  ? Supine =  ?-Hip abduction verse green band 1 sets x 10 reps ? -Hip abduction with green band hold iso with then 1 set x 10 reps bridge ?-Hip adduction iso 1 sets 10 x 5" each  ?-Hip adduction iso hold with then 1 set x 10 reps bridge ?-Ab march x20 cue for core activation without holding breath ?-Quad opening modified thomas stretch 1 x 30 second holds B  ?Standing at counter top=  ?- Tall posture x 60 seconds with cueing for gluteal activation and core activation ? -Balance narrow BOX x 30 seconds eyes open, x 10 seconds eyes closed  ? -Balance modified tandem L x 30 seconds eyes open, x 10 seconds eyes closed  ? -Balance modified tandem R x 30 seconds eyes open, x 10 seconds eyes closed  ?-Side step and return at countertop x 20  ?-Back step and return at countertop x 20  ?Supine= ?   -SLR hamstring stretch 2 x 30 second hold B ? - Manual IASTM using thera gun lv 8 to bilateral quad ?   -Quad opening modified thomas stretch 1 x 30 second holds B  ? ? ?06/25/21: ?Supine =  ?-Ab march x20 cue for core activation without holding breath ?-SAQ x10 each ? - Bridge 2 x 10  cue for core and buttock activation and feet pushing down ? - trial of modified thomas stretch 2 x 30 second holds B  ? - trial of manual SLR hamstring stretch 2 x 30 second hold B with contract relax at initiation of stretch ?Seated =  Hip abduction/ adduction iso 2 sets 10 x 5" each  ? ?Manual IASTM using thera gun lv 8 to bilateral quad (test retest with hip bridge, less pain afterward) ? ? ? ?06/22/21: ?Ab march x20 ?SAQ x15 each ?Bridge 2

## 2021-07-16 NOTE — Addendum Note (Signed)
Addended by: Jamse Belfast on: 07/16/2021 12:15 PM ? ? Modules accepted: Orders ? ?

## 2021-07-20 ENCOUNTER — Emergency Department (HOSPITAL_COMMUNITY): Payer: Medicare Other

## 2021-07-20 ENCOUNTER — Encounter (HOSPITAL_COMMUNITY): Payer: Self-pay | Admitting: Emergency Medicine

## 2021-07-20 ENCOUNTER — Other Ambulatory Visit: Payer: Self-pay

## 2021-07-20 ENCOUNTER — Emergency Department (HOSPITAL_COMMUNITY)
Admission: EM | Admit: 2021-07-20 | Discharge: 2021-07-20 | Disposition: A | Discharge status: Home / Self Care | Payer: Medicare Other | Attending: Emergency Medicine | Admitting: Emergency Medicine

## 2021-07-20 DIAGNOSIS — M25561 Pain in right knee: Secondary | ICD-10-CM | POA: Diagnosis not present

## 2021-07-20 DIAGNOSIS — M7989 Other specified soft tissue disorders: Secondary | ICD-10-CM | POA: Diagnosis not present

## 2021-07-20 DIAGNOSIS — Z96651 Presence of right artificial knee joint: Secondary | ICD-10-CM | POA: Insufficient documentation

## 2021-07-20 DIAGNOSIS — M23342 Other meniscus derangements, anterior horn of lateral meniscus, left knee: Secondary | ICD-10-CM | POA: Diagnosis not present

## 2021-07-20 DIAGNOSIS — Z96641 Presence of right artificial hip joint: Secondary | ICD-10-CM | POA: Diagnosis not present

## 2021-07-20 DIAGNOSIS — M25461 Effusion, right knee: Secondary | ICD-10-CM | POA: Diagnosis not present

## 2021-07-20 DIAGNOSIS — S8981XA Other specified injuries of right lower leg, initial encounter: Secondary | ICD-10-CM | POA: Diagnosis not present

## 2021-07-20 MED ORDER — HYDROCODONE-ACETAMINOPHEN 5-325 MG PO TABS
1.0000 | ORAL_TABLET | Freq: Four times a day (QID) | ORAL | 0 refills | Status: DC | PRN
Start: 2021-07-20 — End: 2021-07-27

## 2021-07-20 NOTE — ED Triage Notes (Signed)
Pt to the ED with complaints of right leg pain after it popped during physical therapy. ? ?

## 2021-07-20 NOTE — Discharge Instructions (Signed)
As discussed, your right knee x-ray is stable in comparison to prior x-rays with no obvious bony or hardware injury noted.  It is possible that you have a soft tissue injury such as a ligament or tendon strain.  You may continue to use ice and elevation is much as is comfortable.  Wear the knee immobilizer to protect the knee joint while ambulating.  Do not drive within 4 hours of taking hydrocodone as this medication will make you drowsy.  Plan to see Dr. Aline Brochure on Monday as you have already arranged. ?

## 2021-07-20 NOTE — ED Provider Notes (Signed)
?[facility] ?Provider Note ? ? ?CSN: [id number] ?Arrival date & time: 07/20/21  4128 ? ?  ? ?History ? ?Chief Complaint  ?Patient presents with  ? Leg Pain  ? ? ?[name] [name] is a 71 y.o. male with a history significant for chronic low back pain secondary to degenerative disc disease having undergone lumbar laminectomy 5 months ago, also history of right total hip and knee arthroplasty presenting for evaluation of right knee pain.  He was undergoing his last physical therapy/rehab treatment last Thursday from his laminectomy surgery when they performed what is described as a straight leg raise on the right when patient felt a popping sensation at his right knee and since then has had significant pain with weightbearing.  He also describes having difficulty sleeping last night secondary to the pain despite treatment with diclofenac and gabapentin.  He denies any specific other injuries or falls but states he does feel like the knee is unstable and has been using his cane more consistently.  He describes having to walk in short steps as bending the knee is painful.  He has found no alleviators for symptoms.  He is scheduled to see his orthopedist Dr. Aline Brochure in 1 week. ? ?The history is provided by the patient.  ? ?  ? ?Home Medications ?Prior to Admission medications   ?Medication Sig Start Date End Date Taking? Authorizing Provider  ?HYDROcodone-acetaminophen (NORCO/VICODIN) 5-325 MG tablet Take 1 tablet by mouth every 6 (six) hours as needed. 07/20/21  Yes Keierra Nudo, Almyra Free, PA-C  ?allopurinol (ZYLOPRIM) 100 MG tablet Take 1 tablet (100 mg total) by mouth daily. 04/01/21   Kathyrn Drown, MD  ?AMBULATORY NON FORMULARY MEDICATION Medication Name: Testosterone 4% Gel ? ?Apply 7.86VE ( 3 clicks) topically daily as directed 05/09/20   Irine Seal, MD  ?amLODipine (NORVASC) 5 MG tablet TAKE 1 TABLET BY MOUTH DAILY 06/03/21   Kathyrn Drown, MD  ?aspirin 81 MG chewable tablet Chew 81 mg by mouth daily.      [provider]  ?atorvastatin (LIPITOR) 40 MG tablet TAKE 1 TABLET BY MOUTH DAILY 06/22/21   Kathyrn Drown, MD  ?B Complex-C (SUPER B COMPLEX PO) Take 1 tablet by mouth daily.    [provider]  ?buPROPion (WELLBUTRIN XL) 150 MG 24 hr tablet TAKE ONE TABLET BY MOUTH DAILY *STOP CITALOPRAM* 04/02/21   Kathyrn Drown, MD  ?diclofenac (VOLTAREN) 75 MG EC tablet 1 twice daily as needed for musculoskeletal pain 06/30/21   Kathyrn Drown, MD  ?gabapentin (NEURONTIN) 100 MG capsule 1 to 2 qhs as directed 06/02/21   Kathyrn Drown, MD  ?hydrochlorothiazide (HYDRODIURIL) 25 MG tablet Take 1 tablet (25 mg total) by mouth daily. 06/15/21   Kathyrn Drown, MD  ?mirabegron ER (MYRBETRIQ) 25 MG TB24 tablet Take 1 tablet (25 mg total) by mouth daily. 05/14/21   Irine Seal, MD  ?Multiple Vitamin (MULTIVITAMIN) tablet Take 1 tablet by mouth daily.    [provider]  ?potassium chloride (KLOR-CON M) 10 MEQ tablet Take 1 tablet (10 mEq total) by mouth daily. 04/01/21   Kathyrn Drown, MD  ?sildenafil (REVATIO) 20 MG tablet 1-5 po prn 11/06/20   Irine Seal, MD  ?   ? ?Allergies    ?Citalopram and Lisinopril   ? ?Review of Systems   ?Review of Systems  ?Constitutional:  Negative for fever.  ?Musculoskeletal:  Positive for arthralgias. Negative for joint swelling and myalgias.  ?Neurological:  Negative  for weakness and numbness.  ?All other systems reviewed and are negative. ? ?Physical Exam ?Updated Vital Signs ?BP (!) 174/88 (BP Location: Left Arm)   Pulse 85   Temp 97.7 ?F (36.5 ?C) (Oral)   Resp 16   Ht '5\' 8"'$  (1.727 m)   Wt 86.2 kg   SpO2 100%   BMI 28.89 kg/m?  ?Physical Exam ?Constitutional:   ?   Appearance: He is well-developed.  ?HENT:  ?   Head: Atraumatic.  ?Cardiovascular:  ?   Comments: Pulses equal bilaterally ?Musculoskeletal:     ?   General: Tenderness present. No swelling or deformity.  ?   Cervical back: Normal range of motion.  ?   Right knee: No swelling, deformity, effusion or  erythema. Decreased range of motion. No LCL laxity, MCL laxity, ACL laxity or PCL laxity. Normal meniscus. Normal pulse.  ?   Comments: Tender to palpation at the right patella and the patellar ligament without any obvious disruption.  He can straight leg raise without knee joint collapse although he does report increased pain with this maneuver.  No quadricep pain or deformity.  ?Skin: ?   General: Skin is warm and dry.  ?Neurological:  ?   Mental Status: He is alert.  ?   Sensory: No sensory deficit.  ?   Motor: No weakness.  ?   Deep Tendon Reflexes: Reflexes normal.  ? ? ?ED Results / Procedures / Treatments   ?Labs ?(all labs ordered are listed, but only abnormal results are displayed) ?Labs Reviewed - No data to display ? ?EKG ?None ? ?Radiology ?DG Knee Complete 4 Views Right ? ?Result Date: 07/20/2021 ?CLINICAL DATA:  Knee pain after patient heard popping noise during physical activity. EXAM: RIGHT KNEE - COMPLETE 4+ VIEW COMPARISON:  Radiograph May 12, 2020 FINDINGS: Total knee arthroplasty. Tubular lucency adjacent to the prosthesis in the lateral femoral condyle is stable dating back to May 09, 2019. No new areas suspicious for loosening. No periprosthetic fracture identified. Soft tissue swelling about the knee. Vascular calcifications. IMPRESSION: Total knee arthroplasty without convincing evidence for hardware complication. Electronically Signed   By: Dahlia Bailiff M.D.   On: 07/20/2021 11:06   ? ?Procedures ?Procedures  ? ? ?Medications Ordered in ED ?Medications - No data to display ? ?ED Course/ Medical Decision Making/ A&P ?  ?                        ?Medical Decision Making ?Patient with right knee pain following a total knee arthroplasty in 2020.  His exam is reassuring today with no obvious fractures on x-ray, he does not have a knee joint effusion and he has no laxity about the joint.  He is tender at his patellar tendon, there is no palpable disruption of the tendon.  We discussed  home treatment including ice and elevation, he may continue his home medications, have added a few hydrocodone tablets for additional pain relief, caution regarding sedation was given.  He was put in a knee immobilizer to use as needed.  Advised to keep his appointment with Dr. Aline Brochure next week as planned. ? ?Amount and/or Complexity of Data Reviewed ?Radiology: ordered and independent interpretation performed. ? ?Risk ?Prescription drug management. ? ? ? ? ? ? ? ? ? ? ?Final Clinical Impression(s) / ED Diagnoses ?Final diagnoses:  ?Acute pain of right knee  ? ? ?Rx / DC Orders ?ED Discharge Orders   ? ?  Ordered  ?  HYDROcodone-acetaminophen (NORCO/VICODIN) 5-325 MG tablet  Every 6 hours PRN       ? 07/20/21 1456  ? ?  ?  ? ?  ? ? ?  ?Evalee Jefferson, PA-C ?07/20/21 9969 ? ?  ?Milton Ferguson, MD ?07/21/21 1749 ? ?

## 2021-07-20 NOTE — ED Notes (Signed)
Pt ambulates with a cane normally ?

## 2021-07-27 ENCOUNTER — Ambulatory Visit (INDEPENDENT_AMBULATORY_CARE_PROVIDER_SITE_OTHER): Payer: Medicare Other | Admitting: Orthopedic Surgery

## 2021-07-27 ENCOUNTER — Encounter (HOSPITAL_COMMUNITY): Payer: Medicare Other

## 2021-07-27 DIAGNOSIS — M25561 Pain in right knee: Secondary | ICD-10-CM

## 2021-07-27 DIAGNOSIS — R6889 Other general symptoms and signs: Secondary | ICD-10-CM | POA: Diagnosis not present

## 2021-07-27 MED ORDER — HYDROCODONE-ACETAMINOPHEN 7.5-325 MG PO TABS: 1.0000 | ORAL_TABLET | ORAL | 0 refills | Status: AC | PRN

## 2021-07-27 NOTE — Addendum Note (Signed)
Addended by: Obie Dredge A on: 07/27/2021 11:35 AM ? ? Modules accepted: Orders ? ?

## 2021-07-27 NOTE — Progress Notes (Signed)
FOLLOW UP  ? ?Encounter Diagnosis  ?Name Primary?  ? Acute pain of right knee Yes  ? ? ? ?Chief Complaint  ?Patient presents with  ? Leg Pain  ?  Right, a week ago 07/17/21 therapist lifted it up and hyperextended it.   ? ? ? ?71 year old male with chronic right-sided lower back pain and sciatica status post right total knee in 2020 was in therapy the therapist hyperextended his leg he had acute pain he is not sure exactly where just around his knee.  He went to the ER for imaging and evaluation the x-rays of his knee replacement showed no change in position.  He thought he felt something shift ? ?His exam is unremarkable he has good power in flexion and extension he is tender around the joint there is no instability in flexion or extension his x-ray is negative ? ?I will place him in a hinged knee brace, get him a walker, get him a handicap sticker ? ?I guess we should reevaluate him in 3 weeks I am not really sure what happened ? ?I think he can be weightbearing as tolerated ? ?I have evaluated images that were taken at the hospital 4 views of the right knee right total knee replacement is seen this is compared to previous x-rays there is no fracture dislocation no change in position of the prosthesis there is no evidence of loosening ? ? ? ?Meds ordered this encounter  ?Medications  ? HYDROcodone-acetaminophen (NORCO) 7.5-325 MG tablet  ?  Sig: Take 1 tablet by mouth every 4 (four) hours as needed for up to 5 days for moderate pain.  ?  Dispense:  30 tablet  ?  Refill:  0  ? ?Encounter Diagnosis  ?Name Primary?  ? Acute pain of right knee Yes  ? ? ?Coding guidelines acute injury uncomplicated independent interpretation of x-ray by another physician prescription drug management ?

## 2021-07-28 ENCOUNTER — Encounter (HOSPITAL_COMMUNITY): Payer: Self-pay

## 2021-07-28 DIAGNOSIS — E139 Other specified diabetes mellitus without complications: Secondary | ICD-10-CM | POA: Diagnosis not present

## 2021-07-28 NOTE — Therapy (Signed)
PHYSICAL THERAPY DISCHARGE SUMMARY ? ?Visits from Start of Care: 6 ? ?Current functional level related to goals / functional outcomes: ?Unclear- self discharge, on last session with progress note from PTA of note was continued knee symptoms and reduced function; had reported a fall same week  ?  ?Remaining deficits: ?Unclear = self discharge, at last progress note statements of High pain, limited R LE functional use, continued use of cane ?  ?Education / Equipment: ?Had an HEP for core stabilization, B hip strengthening in supine, standing balance  ? ?Patient agrees to discharge. Patient goals were not met. Patient is being discharged due to the patient's request.  Patient also has returned to [facility] for follow up on continued high knee pain.   ? ? ?2:34 PM, 07/28/21 ? ?Margarette Asal Carlis Abbott, PT, DPT  ?Contract Physical Therapist at  ?[facility] ?[phone] ? ?

## 2021-07-29 ENCOUNTER — Other Ambulatory Visit: Payer: Self-pay | Admitting: Orthopedic Surgery

## 2021-07-29 ENCOUNTER — Other Ambulatory Visit: Payer: Self-pay | Admitting: Family Medicine

## 2021-07-30 ENCOUNTER — Other Ambulatory Visit: Payer: Self-pay | Admitting: Family Medicine

## 2021-07-30 NOTE — Telephone Encounter (Signed)
May have 6 months refill 

## 2021-08-04 ENCOUNTER — Encounter (HOSPITAL_COMMUNITY): Payer: Medicare Other | Admitting: Physical Therapy

## 2021-08-06 ENCOUNTER — Telehealth: Payer: Self-pay

## 2021-08-06 ENCOUNTER — Other Ambulatory Visit: Payer: Self-pay | Admitting: Orthopedic Surgery

## 2021-08-06 MED ORDER — HYDROCODONE-ACETAMINOPHEN 5-325 MG PO TABS: 1.0000 | ORAL_TABLET | Freq: Four times a day (QID) | ORAL | 0 refills | Status: DC | PRN

## 2021-08-06 NOTE — Progress Notes (Signed)
Meds ordered this encounter  Medications   HYDROcodone-acetaminophen (NORCO) 5-325 MG tablet    Sig: Take 1 tablet by mouth every 6 (six) hours as needed for up to 5 days.    Dispense:  20 tablet    Refill:  0

## 2021-08-06 NOTE — Telephone Encounter (Signed)
Patient is asking for a refill for Hydrocodone from Dr. Aline Brochure. I didn't see it listed in his med list.  [name] [name]

## 2021-08-06 NOTE — Telephone Encounter (Signed)
Patient requesting refill on pain medication.

## 2021-08-07 ENCOUNTER — Other Ambulatory Visit: Payer: Self-pay

## 2021-08-07 ENCOUNTER — Other Ambulatory Visit: Payer: Self-pay | Admitting: Orthopedic Surgery

## 2021-08-07 MED ORDER — HYDROCODONE-ACETAMINOPHEN 5-325 MG PO TABS: 1.0000 | ORAL_TABLET | Freq: Four times a day (QID) | ORAL | 0 refills | Status: AC | PRN

## 2021-08-07 MED ORDER — HYDROCODONE-ACETAMINOPHEN 5-325 MG PO TABS: 1.0000 | ORAL_TABLET | Freq: Four times a day (QID) | ORAL | 0 refills | Status: DC | PRN

## 2021-08-07 NOTE — Progress Notes (Signed)
Meds ordered this encounter  Medications   DISCONTD: HYDROcodone-acetaminophen (NORCO) 5-325 MG tablet    Sig: Take 1 tablet by mouth every 6 (six) hours as needed for up to 5 days.    Dispense:  20 tablet    Refill:  0   HYDROcodone-acetaminophen (NORCO) 5-325 MG tablet    Sig: Take 1 tablet by mouth every 6 (six) hours as needed for up to 5 days.    Dispense:  20 tablet    Refill:  0

## 2021-08-09 ENCOUNTER — Other Ambulatory Visit: Payer: Self-pay | Admitting: Family Medicine

## 2021-08-10 ENCOUNTER — Encounter (HOSPITAL_COMMUNITY): Payer: Medicare Other

## 2021-08-11 ENCOUNTER — Encounter (HOSPITAL_COMMUNITY): Payer: Medicare Other | Admitting: Physical Therapy

## 2021-08-14 ENCOUNTER — Encounter (HOSPITAL_COMMUNITY): Payer: Medicare Other

## 2021-08-19 ENCOUNTER — Ambulatory Visit: Payer: Medicare Other | Admitting: Orthopedic Surgery

## 2021-08-19 DIAGNOSIS — M25561 Pain in right knee: Secondary | ICD-10-CM

## 2021-08-19 MED ORDER — HYDROCODONE-ACETAMINOPHEN 7.5-325 MG PO TABS: 1.0000 | ORAL_TABLET | ORAL | 0 refills | Status: DC | PRN

## 2021-08-19 NOTE — Progress Notes (Signed)
FOLLOW UP   Encounter Diagnosis  Name Primary?   Acute pain of right knee Yes     Chief Complaint  Patient presents with   Knee Pain    RT knee DOI 07/16/21 injured during a PT session     [name] [name] is here today for follow-up.  On April 28 he was in therapy and his right leg was lifted up and it was hyperextended and since that time he has had increased pain on his right side with sciatica-like symptoms radiating down into the knee.  He went to the emergency room x-rays were negative in terms of any complications related to his knee replacement.  He says he thought he felt something shift in his leg.  I placed him in a hinged knee brace put him on a walker with weightbearing as tolerated.  I recommended he get a 3-week reevaluation.  I put him on hydrocodone he is called for 1 refill.  Today his symptoms are periarticular pain along the lateral joint and proximal tibia near the ITB  MOBILITY IS BETTER  BUT STILL HAS LATERAL JOINT PAIN   EXAM ITB SORE TENDER HE HAS ACTIVE ROM 0-110  Still unclear as to what actually happened here.  Recommend he continue with the hinged knee brace advance range of motion as tolerated use the walker  I refilled the pain medication follow-up in 5 to 6 weeks  BRACE  WALKER PAIN MEDS  FU 5 WEEKS  Meds ordered this encounter  Medications   HYDROcodone-acetaminophen (NORCO) 7.5-325 MG tablet    Sig: Take 1 tablet by mouth every 4 (four) hours as needed for up to 5 days for moderate pain.    Dispense:  30 tablet    Refill:  0

## 2021-08-31 ENCOUNTER — Other Ambulatory Visit: Payer: Self-pay | Admitting: Orthopedic Surgery

## 2021-08-31 DIAGNOSIS — M25561 Pain in right knee: Secondary | ICD-10-CM

## 2021-09-07 ENCOUNTER — Other Ambulatory Visit: Payer: Self-pay | Admitting: Family Medicine

## 2021-09-25 ENCOUNTER — Ambulatory Visit: Payer: Medicare Other | Admitting: Orthopedic Surgery

## 2021-09-25 ENCOUNTER — Encounter: Payer: Self-pay | Admitting: Orthopedic Surgery

## 2021-09-25 DIAGNOSIS — M25561 Pain in right knee: Secondary | ICD-10-CM

## 2021-09-25 DIAGNOSIS — M48062 Spinal stenosis, lumbar region with neurogenic claudication: Secondary | ICD-10-CM

## 2021-09-25 MED ORDER — HYDROCODONE-ACETAMINOPHEN 5-325 MG PO TABS
1.0000 | ORAL_TABLET | Freq: Four times a day (QID) | ORAL | 0 refills | Status: DC | PRN
Start: 2021-09-25 — End: 2021-10-16

## 2021-09-25 NOTE — Patient Instructions (Addendum)
The number to [facility] on [address] in [place] [phone] please call Dr Ronnald Ramp to make an appointment      Take hydrocodone as needed   See neurosurgeon for evaluation

## 2021-09-25 NOTE — Progress Notes (Signed)
FOLLOW UP   Encounter Diagnosis  Name Primary?   Acute pain of right knee Yes     Chief Complaint  Patient presents with   Knee Pain    Bilateral / right is painful, but left has started to increase in pain also    Gait Problem    Has a difficult time walking / stooped forward with ambulation    Meds: related  Meloxicam  Voltaren Hydrococodone  Gabapentin  [name] [name] continues to have difficulty with his lower extremities and gait  It is still unclear what actually happened here  However we note today on exam that his hip flexors knee extensors are very weak  He had a lumbar disc procedure back in December at L4-5  His MRI at that time showed some mild spinal stenosis at higher levels.  Based on examination and that MRI I am recommending that he see neurosurgery again to reassess his proximal neuroanatomy for possible progressive spinal stenosis   Meds ordered this encounter  Medications   HYDROcodone-acetaminophen (NORCO/VICODIN) 5-325 MG tablet    Sig: Take 1 tablet by mouth every 6 (six) hours as needed for up to 5 days for moderate pain.    Dispense:  20 tablet    Refill:  0

## 2021-10-15 ENCOUNTER — Other Ambulatory Visit: Payer: Self-pay | Admitting: Orthopedic Surgery

## 2021-10-20 ENCOUNTER — Telehealth: Payer: Self-pay | Admitting: Orthopedic Surgery

## 2021-10-20 NOTE — Telephone Encounter (Signed)
Patient left a voice message relaying that he "was not able to go to his appointment with Dr Ronnald Ramp today"; said also that he almost fell and that his caregiver had to help him back into the house."  Patient is asking for "something stronger to be prescribed". Presently: HYDROcodone-acetaminophen (NORCO/VICODIN) 5-325 MG tablet - uses [facility].

## 2021-10-21 NOTE — Telephone Encounter (Signed)
I advised him can't do anything stronger. He voiced understanding. I also encouraged him to get in with Dr Ronnald Ramp as soon as possible, especially since the weakness is getting worse. He told me he would RS

## 2021-10-22 ENCOUNTER — Other Ambulatory Visit: Payer: Medicare Other

## 2021-10-22 ENCOUNTER — Other Ambulatory Visit: Payer: Self-pay | Admitting: Orthopedic Surgery

## 2021-10-26 ENCOUNTER — Other Ambulatory Visit: Payer: Self-pay

## 2021-10-26 ENCOUNTER — Other Ambulatory Visit: Payer: Medicare Other

## 2021-10-26 DIAGNOSIS — E291 Testicular hypofunction: Secondary | ICD-10-CM

## 2021-10-27 LAB — TESTOSTERONE: Testosterone: 194 ng/dL — ABNORMAL LOW (ref 264–916)

## 2021-10-27 LAB — PSA: Prostate Specific Ag, Serum: <0.1 ng/mL (ref 0.0–4.0)

## 2021-10-28 ENCOUNTER — Other Ambulatory Visit: Payer: Self-pay | Admitting: Orthopedic Surgery

## 2021-10-29 ENCOUNTER — Ambulatory Visit: Payer: Medicare Other | Admitting: Urology

## 2021-11-02 ENCOUNTER — Other Ambulatory Visit: Payer: Self-pay | Admitting: Orthopedic Surgery

## 2021-11-05 ENCOUNTER — Encounter: Payer: Medicare Other | Admitting: Orthopedic Surgery

## 2021-11-05 NOTE — Telephone Encounter (Signed)
Patient called via voice message 4pm, received after 5pm today, 11/05/21, to follow up on interface electronic refill request which appears to have been sent by [facility] electronically "interface") for  HYDROcodone-acetaminophen (NORCO/VICODIN) 5-325 MG tablet 20 tablet

## 2021-11-09 ENCOUNTER — Other Ambulatory Visit: Payer: Self-pay | Admitting: Orthopedic Surgery

## 2021-11-11 ENCOUNTER — Other Ambulatory Visit: Payer: Self-pay

## 2021-11-11 MED ORDER — HYDROCODONE-ACETAMINOPHEN 5-325 MG PO TABS: ORAL_TABLET | ORAL | 0 refills | Status: DC

## 2021-11-11 NOTE — Telephone Encounter (Signed)
Hydrocodone-Acetaminophen 5/325 MG  Qty 20 Tablets  TAKE 1 TABLET BY MOUTH EVERY 6 HOURS AS NEEDED FOR MODERATE PAIN.  PATIENT USES [facility]

## 2021-11-17 DIAGNOSIS — M5416 Radiculopathy, lumbar region: Secondary | ICD-10-CM | POA: Diagnosis not present

## 2021-11-19 ENCOUNTER — Encounter: Payer: Self-pay | Admitting: Orthopedic Surgery

## 2021-11-19 ENCOUNTER — Other Ambulatory Visit: Payer: Self-pay | Admitting: Orthopedic Surgery

## 2021-11-19 ENCOUNTER — Ambulatory Visit: Payer: Medicare Other | Admitting: Orthopedic Surgery

## 2021-11-19 DIAGNOSIS — G894 Chronic pain syndrome: Secondary | ICD-10-CM | POA: Diagnosis not present

## 2021-11-19 DIAGNOSIS — M48062 Spinal stenosis, lumbar region with neurogenic claudication: Secondary | ICD-10-CM

## 2021-11-19 MED ORDER — HYDROCODONE-ACETAMINOPHEN 5-325 MG PO TABS: ORAL_TABLET | ORAL | 0 refills | Status: DC

## 2021-11-19 NOTE — Patient Instructions (Signed)
While we are working on your approval please go ahead and call to schedule your appointment with [facility] in at least 2 weeks.    Central Scheduling [phone]  AFTER you have made your imaging appointment, please call our office back at [phone] to schedule an appointment to review your results. Your follow up appointment will need to be 3-4 days after your imaging is performed to allow [place] time to get the report. If you are having your imaging performed in a facility not associated with [facility], you will need to ask for a CD with your images on them.

## 2021-11-19 NOTE — Addendum Note (Signed)
Addended by: Elizabeth Sauer on: 11/19/2021 10:28 AM   Modules accepted: Orders

## 2021-11-19 NOTE — Progress Notes (Signed)
Chief Complaint  Patient presents with   Knee Pain    Bilateral   Gait Problem    Has seen neurosurgery, they wanted MRI wants you to ok this before he goes for MRI scan    Medication Refill    Hydrocodone, has ? About filling on Monday its a holiday    Encounter Diagnosis  Name Primary?   Chronic pain syndrome Yes   Meds ordered this encounter  Medications   HYDROcodone-acetaminophen (NORCO/VICODIN) 5-325 MG tablet    Sig: TAKE 1 TABLET BY MOUTH EVERY (6) HOURS AS NEEDED FOR MODERATE PAIN.    Dispense:  20 tablet    Refill:  0    NEXT FILL 11/11/21   [name] [name] saw neurosurgery they want an MRI of his back which is fine he still has his proximal weakness he says his left knee pain is 2 out of 10 but it is aggravating and it causes him to have difficulty walking the next day he is having some grinding on the left side he says it is a nuisance but the pain is not that bad  The right knee is doing better with the bracing and we will continue that  He is having some external rotation of his right foot when he walks which again is somewhat unclear why he is having that  He is status post right total hip right total knee  MRI lumbar spine to evaluate hip girdle weakness  2 refills on his medication 1 today and 1 for Tuesday, September 5  Follow-up after MRI

## 2021-11-25 ENCOUNTER — Other Ambulatory Visit: Payer: Self-pay | Admitting: Orthopedic Surgery

## 2021-11-25 DIAGNOSIS — G894 Chronic pain syndrome: Secondary | ICD-10-CM

## 2021-11-26 ENCOUNTER — Ambulatory Visit: Payer: Medicare Other | Admitting: Urology

## 2021-11-26 VITALS — BP 160/66 | HR 121

## 2021-11-26 DIAGNOSIS — E291 Testicular hypofunction: Secondary | ICD-10-CM | POA: Diagnosis not present

## 2021-11-26 DIAGNOSIS — N5201 Erectile dysfunction due to arterial insufficiency: Secondary | ICD-10-CM

## 2021-11-26 DIAGNOSIS — R3129 Other microscopic hematuria: Secondary | ICD-10-CM

## 2021-11-26 DIAGNOSIS — N3941 Urge incontinence: Secondary | ICD-10-CM

## 2021-11-26 DIAGNOSIS — Z8546 Personal history of malignant neoplasm of prostate: Secondary | ICD-10-CM

## 2021-11-26 LAB — URINALYSIS, ROUTINE W REFLEX MICROSCOPIC
Bilirubin, UA: NEGATIVE
Glucose, UA: NEGATIVE
Ketones, UA: NEGATIVE
Leukocytes,UA: NEGATIVE
Nitrite, UA: NEGATIVE
Protein,UA: NEGATIVE
Specific Gravity, UA: 1.02 (ref 1.005–1.030)
Urobilinogen, Ur: 0.2 mg/dL (ref 0.2–1.0)
pH, UA: 6 (ref 5.0–7.5)

## 2021-11-26 LAB — MICROSCOPIC EXAMINATION
Bacteria, UA: NONE SEEN
Renal Epithel, UA: NONE SEEN /hpf
WBC, UA: NONE SEEN /hpf (ref 0–5)

## 2021-11-26 NOTE — Progress Notes (Signed)
Subjective:  1. Hypogonadism in male   2. Erectile dysfunction due to arterial insufficiency   3. History of prostate cancer   4. Urge incontinence   5. Microhematuria       Mr. Cavitt returns today in f/u for his history of hypogonadism and prostate cancer. He is s/p a RALP on 04/20/11. His PSA was <0.1 on 10/26/21. He had Gleason 7(3+4) T2c N0 disease.   He had a right knee injury in therapy and hasn't been able to be active or get out much.   He has MUI and was given Myrbetriq samples at his last visit.   It didn't help.   He rarely wear pads.  He has switched his diuretic to bedtime and does better with the  nocturia since he has a delayed response.  He has no SUI but can have UUI but that is rare if he is careful and has noted that once he gets the urge he can only hold it q15-20 min.  He has no hematuria or dysuria. He has a good stream. His IPSS is 11.  UA has 3-10 RBC's today.   He has had some erectile dysfunction that he manages with sildenafil '20mg'$ .   He has not been active in the last 2 months.   He remains on compounded testosterone 4% 3 clicks daily but he is only using it intermittently because of his mobility issues. His T level is 194 and his last Hgb on 04/29/21 was 15.2 which was stable.   He is doing well with improved energy.     IPSS     Row Name 11/26/21 1300         International Prostate Symptom Score   How often have you had the sensation of not emptying your bladder? Less than 1 in 5     How often have you had to urinate less than every two hours? Not at All     How often have you found you stopped and started again several times when you urinated? Not at All     How often have you found it difficult to postpone urination? About half the time     How often have you had a weak urinary stream? Less than 1 in 5 times     How often have you had to strain to start urination? Not at All     How many times did you typically get up at night to urinate? 3 Times      Total IPSS Score 8       Quality of Life due to urinary symptoms   If you were to spend the rest of your life with your urinary condition just the way it is now how would you feel about that? Mostly Satisfied                        ROS:  ROS:  A complete review of systems was performed.  All systems are negative except for pertinent findings as noted.   Review of Systems  Endo/Heme/Allergies:  Bruises/bleeds easily.  Psychiatric/Behavioral:  Positive for depression. The patient is nervous/anxious.   All other systems reviewed and are negative.   Allergies  Allergen Reactions   Citalopram     Fatigue headache    Lisinopril Cough    Dry cough    Outpatient Encounter Medications as of 11/26/2021  Medication Sig   allopurinol (ZYLOPRIM) 100 MG tablet TAKE 1 TABLET BY MOUTH DAILY  AMBULATORY NON FORMULARY MEDICATION Medication Name: Testosterone 4% Gel  Apply 0.25KY ( 3 clicks) topically daily as directed   amLODipine (NORVASC) 5 MG tablet TAKE 1 TABLET BY MOUTH DAILY   aspirin 81 MG chewable tablet Chew 81 mg by mouth daily.    atorvastatin (LIPITOR) 40 MG tablet TAKE 1 TABLET BY MOUTH DAILY   B Complex-C (SUPER B COMPLEX PO) Take 1 tablet by mouth daily.   buPROPion (WELLBUTRIN XL) 150 MG 24 hr tablet TAKE 1 TABLET BY MOUTH DAILY -  STOP CITALOPRAM   hydrochlorothiazide (HYDRODIURIL) 25 MG tablet TAKE 1 TABLET BY MOUTH DAILY   HYDROcodone-acetaminophen (NORCO/VICODIN) 5-325 MG tablet TAKE 1 TABLET BY MOUTH EVERY (6) HOURS AS NEEDED FOR MODERATE PAIN.   meloxicam (MOBIC) 7.5 MG tablet TAKE 1 TABLET BY MOUTH DAILY   Multiple Vitamin (MULTIVITAMIN) tablet Take 1 tablet by mouth daily.   potassium chloride (KLOR-CON M) 10 MEQ tablet TAKE 1 TABLET BY MOUTH DAILY   sildenafil (REVATIO) 20 MG tablet 1-5 po prn   [DISCONTINUED] diclofenac (VOLTAREN) 75 MG EC tablet 1 twice daily as needed for musculoskeletal pain (Patient not taking: Reported on 11/26/2021)    [DISCONTINUED] gabapentin (NEURONTIN) 100 MG capsule 1 to 2 qhs as directed (Patient not taking: Reported on 11/26/2021)   [DISCONTINUED] mirabegron ER (MYRBETRIQ) 25 MG TB24 tablet Take 1 tablet (25 mg total) by mouth daily.   No facility-administered encounter medications on file as of 11/26/2021.    Past Medical History:  Diagnosis Date   Aortic atherosclerosis ([place]) 10/19/2016    Seen on chest x-ray July 2018   Arthritis    right knee and hip    Cancer [facility])    prostate cancer    Chronic kidney disease    prostate cancer    Diabetes mellitus without complication ([place])    GERD (gastroesophageal reflux disease)    Gout    Gout    Hypercholesteremia    Hyperglycemia    Hyperlipidemia    Hypertension    Sleep apnea    Status post THR (total hip replacement) 04/20/13 05/01/2013   Right total hip 04/20/2013 Dr. Lydia Guiles implant     Past Surgical History:  Procedure Laterality Date   COLONOSCOPY     COLONOSCOPY N/A 10/22/2013   Procedure: COLONOSCOPY;  Surgeon: Danie Binder, MD;  Location: [facility];  Service: Endoscopy;  Laterality: N/A;  1115   HERNIA REPAIR     right inguinal hernia repair    LUMBAR LAMINECTOMY/DECOMPRESSION MICRODISCECTOMY Right 03/16/2021   Procedure: Right Lumbar four-five Laminectomy/foraminotomy;  Surgeon: Eustace Moore, MD;  Location: [facility];  Service: Neurosurgery;  Laterality: Right;   NASAL SEPTUM SURGERY     OTHER SURGICAL HISTORY     deviated septum surgery    ROBOT ASSISTED LAPAROSCOPIC RADICAL PROSTATECTOMY  04/20/2011   Procedure: ROBOTIC ASSISTED LAPAROSCOPIC RADICAL PROSTATECTOMY;  Surgeon: Malka So, MD;  Location: [facility];  Service: Urology;  Laterality: N/A;  Bilateral lymph node dissection   TOTAL HIP ARTHROPLASTY Right 04/20/2013   Procedure: TOTAL HIP ARTHROPLASTY;  Surgeon: Carole Civil, MD;  Location: [facility];  Service: Orthopedics;  Laterality: Right;   TOTAL KNEE ARTHROPLASTY Right 05/16/2018   Procedure: TOTAL KNEE  ARTHROPLASTY;  Surgeon: Carole Civil, MD;  Location: [facility];  Service: Orthopedics;  Laterality: Right;    Social History   Socioeconomic History   Marital status: Single    Spouse name: Not on file   Number of  children: Not on file   Years of education: Not on file   Highest education level: Not on file  Occupational History   Not on file  Tobacco Use   Smoking status: Former    Packs/day: 0.50    Years: 8.00    Total pack years: 4.00    Types: Cigarettes    Quit date: 03/22/1984    Years since quitting: 37.7   Smokeless tobacco: Never  Vaping Use   Vaping Use: Never used  Substance and Sexual Activity   Alcohol use: Yes    Alcohol/week: 1.0 standard drink of alcohol    Types: 1 Cans of beer per week    Comment: occasional    Drug use: No   Sexual activity: Yes    Birth control/protection: None  Other Topics Concern   Not on file  Social History Narrative   Not on file   Social Determinants of Health   Financial Resource Strain: Low Risk  (12/14/2020)   Overall Financial Resource Strain (CARDIA)    Difficulty of Paying Living Expenses: Not hard at all  Food Insecurity: No Food Insecurity (12/14/2020)   Hunger Vital Sign    Worried About Running Out of Food in the Last Year: Never true    Ran Out of Food in the Last Year: Never true  Transportation Needs: No Transportation Needs (12/14/2020)   PRAPARE - Hydrologist (Medical): No    Lack of Transportation (Non-Medical): No  Physical Activity: Insufficiently Active (12/14/2020)   Exercise Vital Sign    Days of Exercise per Week: 2 days    Minutes of Exercise per Session: 10 min  Stress: No Stress Concern Present (12/14/2020)   [place]    Feeling of Stress : Not at all  Social Connections: Moderately Isolated (12/14/2020)   Social Connection and Isolation Panel [NHANES]    Frequency of Communication with Friends and  Family: More than three times a week    Frequency of Social Gatherings with Friends and Family: More than three times a week    Attends Religious Services: Never    Marine scientist or Organizations: No    Attends Archivist Meetings: Never    Marital Status: Living with partner  Intimate Partner Violence: Not At Risk (12/14/2020)   Humiliation, Afraid, Rape, and Kick questionnaire    Fear of Current or Ex-Partner: No    Emotionally Abused: No    Physically Abused: No    Sexually Abused: No    Family History  Problem Relation Age of Onset   Diabetes Mother    Hypertension Mother    Hypertension Brother        Objective: BP (!) 160/66   Pulse (!) 121     Physical Exam Vitals reviewed.  Constitutional:      Appearance: Normal appearance.  Neurological:     Mental Status: He is alert.     Lab Results:  Results for orders placed or performed in visit on 11/26/21 (from the past 24 hour(s))  Urinalysis, Routine w reflex microscopic     Status: Abnormal   Collection Time: 11/26/21  1:44 PM  Result Value Ref Range   Specific Gravity, UA 1.020 1.005 - 1.030   pH, UA 6.0 5.0 - 7.5   Color, UA Yellow Yellow   Appearance Ur Clear Clear   Leukocytes,UA Negative Negative   Protein,UA Negative Negative/Trace   Glucose, UA  Negative Negative   Ketones, UA Negative Negative   RBC, UA 1+ (A) Negative   Bilirubin, UA Negative Negative   Urobilinogen, Ur 0.2 0.2 - 1.0 mg/dL   Nitrite, UA Negative Negative   Microscopic Examination See below:    Narrative   Performed at:  [facility] [address], [place], [place]  [id number] Lab Director: Mina Marble MT, Phone:  [phone]  Microscopic Examination     Status: Abnormal   Collection Time: 11/26/21  1:44 PM   Urine  Result Value Ref Range   WBC, UA None seen 0 - 5 /hpf   RBC, Urine 3-10 (A) 0 - 2 /hpf   Epithelial Cells (non renal) 0-10 0 - 10 /hpf   Renal Epithel, UA None seen None seen  /hpf   Bacteria, UA None seen None seen/Few   Narrative   Performed at:  [facility] [address], [place], [place]  [id number] Lab Director: Al[place]ra [place], Phone:  [phone]    UA has 3-10 RBC's.     BMET No results for input(s): "NA", "K", "CL", "CO2", "GLUCOSE", "BUN", "CREATININE", "CALCIUM" in the last 72 hours. PSA PSA  Date Value Ref Range Status  05/09/2019 <0.1 < OR = 4.0 ng/mL Final    Comment:    The total PSA value from this assay system is  standardized against the WHO standard. The test  result will be approximately 20% lower when compared  to the equimolar-standardized total PSA (Beckman  Coulter). Comparison of serial PSA results should be  interpreted with this fact in mind. . This test was performed using the Siemens  chemiluminescent method. Values obtained from  different assay methods cannot be used interchangeably. PSA levels, regardless of value, should not be interpreted as absolute evidence of the presence or absence of disease.    Testosterone  Date Value Ref Range Status  10/26/2021 194 (L) 264 - 916 ng/dL Final    Comment:    Adult male reference interval is based on a population of healthy nonobese males (BMI <30) between 46 and 34 years old. [place], [place] [phone]. PMID: 38756433.   04/29/2021 272 264 - 916 ng/dL Final    Comment:    Adult male reference interval is based on a population of healthy nonobese males (BMI <30) between 68 and 61 years old. [place], [place] [phone]. PMID: 60109323.   10/30/2020 285 264 - 916 ng/dL Final    Comment:    Adult male reference interval is based on a population of healthy nonobese males (BMI <30) between 69 and 75 years old. [place], [place] [phone]. PMID: [id number].    Hgb is 14.4.  UA is clear.  Results for orders placed or performed in visit on 11/26/21 (from the past 24 hour(s))  Urinalysis, Routine w reflex  microscopic     Status: Abnormal   Collection Time: 11/26/21  1:44 PM  Result Value Ref Range   Specific Gravity, UA 1.020 1.005 - 1.030   pH, UA 6.0 5.0 - 7.5   Color, UA Yellow Yellow   Appearance Ur Clear Clear   Leukocytes,UA Negative Negative   Protein,UA Negative Negative/Trace   Glucose, UA Negative Negative   Ketones, UA Negative Negative   RBC, UA 1+ (A) Negative   Bilirubin, UA Negative Negative   Urobilinogen, Ur 0.2 0.2 - 1.0 mg/dL   Nitrite, UA Negative Negative   Microscopic Examination See below:    Narrative   Performed  at:  [facility] [address], [place], [place]  [id number] Lab Director: Mina Marble MT, Phone:  [phone]  Microscopic Examination     Status: Abnormal   Collection Time: 11/26/21  1:44 PM   Urine  Result Value Ref Range   WBC, UA None seen 0 - 5 /hpf   RBC, Urine 3-10 (A) 0 - 2 /hpf   Epithelial Cells (non renal) 0-10 0 - 10 /hpf   Renal Epithel, UA None seen None seen /hpf   Bacteria, UA None seen None seen/Few   Narrative   Performed at:  [facility] [address], [place], [place]  [id number] Lab Director: [place], Phone:  [phone]       Studies/Results: No results found.    Assessment & Plan: Hypogonadism.  He is doing well on therapy.  Med refilled with hand written script.  I will get labs in 6 months prior to f/u.  ED.  Sildenafil remains effective.  Hx of prostate cancer.  PSA remains undetectible.  Urgency with UUI and nocturia.   I will try him on Myrbetriq '25mg'$  and gave him samples.   Instructions and side effects reviewed.   His BP is generally lower at home.   He will call if he would like a script.     No orders of the defined types were placed in this encounter.     Orders Placed This Encounter  Procedures   Microscopic Examination   CT HEMATURIA WORKUP    Standing Status:   Future    Standing Expiration Date:   11/27/2022    Order Specific Question:   Reason  for Exam (SYMPTOM  OR DIAGNOSIS REQUIRED)    Answer:   microhematuria    Order Specific Question:   Preferred imaging location?    Answer:   [facility]    Order Specific Question:   Radiology Contrast Protocol - do NOT remove file path    Answer:   [email or web address]   Urinalysis, Routine w reflex microscopic   PSA    Standing Status:   Future    Standing Expiration Date:   11/27/2022   Testosterone    Standing Status:   Future    Standing Expiration Date:   11/27/2022   Hemoglobin and hematocrit, blood    Standing Status:   Future    Standing Expiration Date:   11/27/2022      Return for Next available with CT for possible cystoscopy and then he will need 6 month f/u with labs. .   CC: Kathyrn Drown, MD      Irine Seal 11/27/2021 Patient ID: Augustin Schooling, male   DOB: 07-11-1950, 71 y.o.   MRN: [id number]

## 2021-11-27 ENCOUNTER — Encounter: Payer: Self-pay | Admitting: Urology

## 2021-11-30 ENCOUNTER — Other Ambulatory Visit: Payer: Self-pay | Admitting: Orthopedic Surgery

## 2021-11-30 ENCOUNTER — Telehealth: Payer: Self-pay | Admitting: *Deleted

## 2021-11-30 DIAGNOSIS — Z79899 Other long term (current) drug therapy: Secondary | ICD-10-CM

## 2021-11-30 DIAGNOSIS — G894 Chronic pain syndrome: Secondary | ICD-10-CM

## 2021-11-30 DIAGNOSIS — E785 Hyperlipidemia, unspecified: Secondary | ICD-10-CM

## 2021-11-30 DIAGNOSIS — R7303 Prediabetes: Secondary | ICD-10-CM

## 2021-11-30 MED ORDER — HYDROCODONE-ACETAMINOPHEN 5-325 MG PO TABS: ORAL_TABLET | ORAL | 0 refills | Status: DC

## 2021-11-30 NOTE — Telephone Encounter (Signed)
Blood work ordered in Epic. Patient notified. 

## 2021-11-30 NOTE — Telephone Encounter (Signed)
Patient called to request refill: HYDROcodone-acetaminophen (NORCO/VICODIN) 5-325 MG tablet 20 tablet      [employer]

## 2021-11-30 NOTE — Telephone Encounter (Signed)
Patient has appt Thursday and wonders if he needs labs for appt. Patient had routine labs 3/23 and has future labs ordered from Urology(testosterone, PSA and hemoglobing/Hematocrt)

## 2021-11-30 NOTE — Telephone Encounter (Signed)
Metabolic 7, lipid, liver Hyperlipidemia, hypertension, diuretic use

## 2021-12-02 ENCOUNTER — Telehealth: Payer: Self-pay

## 2021-12-02 DIAGNOSIS — E785 Hyperlipidemia, unspecified: Secondary | ICD-10-CM | POA: Diagnosis not present

## 2021-12-02 DIAGNOSIS — Z79899 Other long term (current) drug therapy: Secondary | ICD-10-CM | POA: Diagnosis not present

## 2021-12-02 DIAGNOSIS — R7303 Prediabetes: Secondary | ICD-10-CM | POA: Diagnosis not present

## 2021-12-02 NOTE — Telephone Encounter (Signed)
Patient left a voice message on 12-01-2021.  Wanted to discuss is CT scan.  Call back:  [phone]  Thanks, Helene Kelp

## 2021-12-03 ENCOUNTER — Encounter: Payer: Self-pay | Admitting: Family Medicine

## 2021-12-03 ENCOUNTER — Ambulatory Visit (INDEPENDENT_AMBULATORY_CARE_PROVIDER_SITE_OTHER): Payer: Medicare Other | Admitting: Family Medicine

## 2021-12-03 VITALS — BP 130/70 | Wt 186.2 lb

## 2021-12-03 DIAGNOSIS — R748 Abnormal levels of other serum enzymes: Secondary | ICD-10-CM | POA: Diagnosis not present

## 2021-12-03 DIAGNOSIS — I872 Venous insufficiency (chronic) (peripheral): Secondary | ICD-10-CM | POA: Diagnosis not present

## 2021-12-03 DIAGNOSIS — Z23 Encounter for immunization: Secondary | ICD-10-CM | POA: Diagnosis not present

## 2021-12-03 DIAGNOSIS — I10 Essential (primary) hypertension: Secondary | ICD-10-CM

## 2021-12-03 DIAGNOSIS — G8929 Other chronic pain: Secondary | ICD-10-CM | POA: Diagnosis not present

## 2021-12-03 DIAGNOSIS — M25561 Pain in right knee: Secondary | ICD-10-CM

## 2021-12-03 LAB — BASIC METABOLIC PANEL
BUN/Creatinine Ratio: 16 (ref 10–24)
BUN: 16 mg/dL (ref 8–27)
CO2: 22 mmol/L (ref 20–29)
Calcium: 10 mg/dL (ref 8.6–10.2)
Chloride: 95 mmol/L — ABNORMAL LOW (ref 96–106)
Creatinine, Ser: 1.01 mg/dL (ref 0.76–1.27)
Glucose: 117 mg/dL — ABNORMAL HIGH (ref 70–99)
Potassium: 4 mmol/L (ref 3.5–5.2)
Sodium: 140 mmol/L (ref 134–144)
eGFR: 79 mL/min/{1.73_m2} (ref >59)

## 2021-12-03 LAB — LIPID PANEL
Chol/HDL Ratio: 3.6 ratio (ref 0.0–5.0)
Cholesterol, Total: 187 mg/dL (ref 100–199)
HDL: 52 mg/dL (ref >39)
LDL Chol Calc (NIH): 112 mg/dL — ABNORMAL HIGH (ref 0–99)
Triglycerides: 127 mg/dL (ref 0–149)
VLDL Cholesterol Cal: 23 mg/dL (ref 5–40)

## 2021-12-03 LAB — HEPATIC FUNCTION PANEL
ALT: 13 IU/L (ref 0–44)
AST: 18 IU/L (ref 0–40)
Albumin: 4.9 g/dL — ABNORMAL HIGH (ref 3.8–4.8)
Alkaline Phosphatase: 156 IU/L — ABNORMAL HIGH (ref 44–121)
Bilirubin Total: 1.1 mg/dL (ref 0.0–1.2)
Bilirubin, Direct: 0.22 mg/dL (ref 0.00–0.40)
Total Protein: 7.5 g/dL (ref 6.0–8.5)

## 2021-12-03 NOTE — Telephone Encounter (Signed)
Received message from patient regarding discussion of CT scan. Patient have not had CT scan done as of yet and will need to call to schedule CT scan. Made patient aware to call [facility] Radiology dept to schedule his CT scan. Patient voiced understanding

## 2021-12-03 NOTE — Progress Notes (Signed)
   Subjective:    Patient ID: [name] [name], male    DOB: 1951-03-06, 71 y.o.   MRN: [id number]  HPI Pt arrives for follow up on HTN.   Pt went to Dr.Wrenn last week and blood pressure was elevated. Pt checks blood pressure occasionally at home.   Pt having some redness in feet and legs and pt is afraid he may have a circulation issue due to not being as mobile.   Pt has MRI Lumbar Spine scheduled for Tuesday.    Review of Systems     Objective:   Physical Exam  Lungs clear heart regular pulse normal BP good  Greater than 30 minutes spent in total with the patient as well as charting as well as documenting multiple issues    Assessment & Plan:  1. Benign essential hypertension Blood pressure good control continue current measures  2. Venous stasis dermatitis of both lower extremities This is causing some dermatitis if he gets worse I recommend knee-high surgical support stockings  3. Elevated serum GGT level We will check a GGT because of elevated liver alkaline phos  4. Need for vaccination Today - Flu Vaccine QUAD High Dose(Fluad)  5. Chronic pain of right knee Having chronic pain in the knee Currently Dr. Aline Brochure is prescribing hydrocodone 5 mg he is taking approximately 21-3 or 4 pills/day he states at times he needs 10 mg in the morning and 5 mg later in the day we did discuss chronic pain management expectations and parameters if he decides to do that he will let us know he will talk with Dr. Aline Brochure first

## 2021-12-07 ENCOUNTER — Other Ambulatory Visit: Payer: Self-pay

## 2021-12-07 ENCOUNTER — Ambulatory Visit (HOSPITAL_COMMUNITY)
Admission: RE | Admit: 2021-12-07 | Discharge: 2021-12-07 | Disposition: A | Discharge status: Home / Self Care | Payer: Medicare Other | Source: Ambulatory Visit | Attending: Orthopedic Surgery | Admitting: Orthopedic Surgery

## 2021-12-07 ENCOUNTER — Ambulatory Visit (HOSPITAL_COMMUNITY)
Admission: RE | Admit: 2021-12-07 | Discharge: 2021-12-07 | Disposition: A | Discharge status: Home / Self Care | Payer: Medicare Other | Source: Ambulatory Visit | Attending: Urology | Admitting: Urology

## 2021-12-07 ENCOUNTER — Encounter (HOSPITAL_COMMUNITY): Payer: Self-pay | Admitting: Radiology

## 2021-12-07 DIAGNOSIS — G894 Chronic pain syndrome: Secondary | ICD-10-CM | POA: Insufficient documentation

## 2021-12-07 DIAGNOSIS — R3129 Other microscopic hematuria: Secondary | ICD-10-CM | POA: Diagnosis not present

## 2021-12-07 DIAGNOSIS — M5126 Other intervertebral disc displacement, lumbar region: Secondary | ICD-10-CM | POA: Diagnosis not present

## 2021-12-07 DIAGNOSIS — M48062 Spinal stenosis, lumbar region with neurogenic claudication: Secondary | ICD-10-CM | POA: Insufficient documentation

## 2021-12-07 DIAGNOSIS — M47816 Spondylosis without myelopathy or radiculopathy, lumbar region: Secondary | ICD-10-CM | POA: Diagnosis not present

## 2021-12-07 DIAGNOSIS — R319 Hematuria, unspecified: Secondary | ICD-10-CM | POA: Diagnosis not present

## 2021-12-07 MED ORDER — HYDROCODONE-ACETAMINOPHEN 5-325 MG PO TABS: ORAL_TABLET | ORAL | 0 refills | Status: DC

## 2021-12-07 MED ORDER — IOHEXOL 350 MG/ML SOLN
100.0000 mL | Freq: Once | INTRAVENOUS | Status: AC | PRN
  Administered 2021-12-07: 100 mL via INTRAVENOUS

## 2021-12-07 NOTE — Telephone Encounter (Signed)
Hydrocodone-Acetaminophen 5/325 MG  Qty 20 Tablets  One q 6 hrs prn pain  PATIENT USES [facility]

## 2021-12-08 ENCOUNTER — Ambulatory Visit (HOSPITAL_COMMUNITY): Payer: Medicare Other

## 2021-12-08 ENCOUNTER — Other Ambulatory Visit: Payer: Self-pay | Admitting: Family Medicine

## 2021-12-10 LAB — SPECIMEN STATUS REPORT

## 2021-12-11 ENCOUNTER — Ambulatory Visit (INDEPENDENT_AMBULATORY_CARE_PROVIDER_SITE_OTHER): Payer: Medicare Other

## 2021-12-11 ENCOUNTER — Ambulatory Visit: Payer: Medicare Other | Admitting: Orthopedic Surgery

## 2021-12-11 ENCOUNTER — Encounter: Payer: Self-pay | Admitting: Orthopedic Surgery

## 2021-12-11 DIAGNOSIS — Z96651 Presence of right artificial knee joint: Secondary | ICD-10-CM | POA: Diagnosis not present

## 2021-12-11 DIAGNOSIS — M25551 Pain in right hip: Secondary | ICD-10-CM

## 2021-12-11 DIAGNOSIS — Z96641 Presence of right artificial hip joint: Secondary | ICD-10-CM | POA: Diagnosis not present

## 2021-12-11 DIAGNOSIS — S73004A Unspecified dislocation of right hip, initial encounter: Secondary | ICD-10-CM | POA: Diagnosis not present

## 2021-12-11 MED ORDER — HYDROCODONE-ACETAMINOPHEN 5-325 MG PO TABS: 1.0000 | ORAL_TABLET | ORAL | 0 refills | Status: DC | PRN

## 2021-12-11 NOTE — Patient Instructions (Signed)
Dr Frederik Pear Address: [address], [place], [place] [address] Phone: [phone]

## 2021-12-11 NOTE — Progress Notes (Signed)
Follow-up  Chief Complaint  Patient presents with   Knee Pain    Bilateral    Results    Review MRI lumbar spine    71 year old male who had a right total hip in 2015 did well with no issues with his hip  He also had a right total knee 2020 and did well.  He did have some issues with his lumbar spine and was seeing neurosurgery  He was in physical therapy back in late April and while doing some exercises he felt something shift in his leg and presented to [place] with acute right knee pain which was worked up and thought to be secondary to leg strain or hamstring injury.  Patient never complained of any hip or back pain but upon seeing urology for hematuria was found to have dislocated hip.  He has been ambulatory with a walker and a knee brace, he did require some medication but only hydrocodone 5 mg every 4-6 hours  Hip implant: [employer] Accolade 227 degree stem 35 neck 36/5 head with TriTanium solid 56 shell and a 36 polyethylene cup  Review of Systems  Musculoskeletal:        Left hip pain and left knee pain        Past Medical History:  Diagnosis Date   Aortic atherosclerosis ([place]) 10/19/2016    Seen on chest x-ray July 2018   Arthritis    right knee and hip    Cancer ([place])    prostate cancer    Chronic kidney disease    prostate cancer    Diabetes mellitus without complication ([place])    GERD (gastroesophageal reflux disease)    Gout    Gout    Hypercholesteremia    Hyperglycemia    Hyperlipidemia    Hypertension    Sleep apnea    Status post THR (total hip replacement) 04/20/13 05/01/2013   Right total hip 04/20/2013 Dr. Lydia Guiles implant    Past Surgical History:  Procedure Laterality Date   COLONOSCOPY     COLONOSCOPY N/A 10/22/2013   Procedure: COLONOSCOPY;  Surgeon: Danie Binder, MD;  [facility];  Service: Endoscopy;  Laterality: N/A;  1115   HERNIA REPAIR     right inguinal hernia repair    LUMBAR LAMINECTOMY/DECOMPRESSION MICRODISCECTOMY  Right 03/16/2021   Procedure: Right Lumbar four-five Laminectomy/foraminotomy;  Surgeon: Eustace Moore, MD;  Location: [facility];  Service: Neurosurgery;  Laterality: Right;   NASAL SEPTUM SURGERY     OTHER SURGICAL HISTORY     deviated septum surgery    ROBOT ASSISTED LAPAROSCOPIC RADICAL PROSTATECTOMY  04/20/2011   Procedure: ROBOTIC ASSISTED LAPAROSCOPIC RADICAL PROSTATECTOMY;  Surgeon: Malka So, MD;  [facility];  Service: Urology;  Laterality: N/A;  Bilateral lymph node dissection   TOTAL HIP ARTHROPLASTY Right 04/20/2013   Procedure: TOTAL HIP ARTHROPLASTY;  Surgeon: Carole Civil, MD;  [facility];  Service: Orthopedics;  Laterality: Right;   TOTAL KNEE ARTHROPLASTY Right 05/16/2018   Procedure: TOTAL KNEE ARTHROPLASTY;  Surgeon: Carole Civil, MD;  [facility];  Service: Orthopedics;  Laterality: Right;   Family History  Problem Relation Age of Onset   Diabetes Mother    Hypertension Mother    Hypertension Brother    Social History   Tobacco Use   Smoking status: Former    Packs/day: 0.50    Years: 8.00    Total pack years: 4.00    Types: Cigarettes    Quit  date: 03/22/1984    Years since quitting: 37.7   Smokeless tobacco: Never  Vaping Use   Vaping Use: Never used  Substance Use Topics   Alcohol use: Yes    Alcohol/week: 1.0 standard drink of alcohol    Types: 1 Cans of beer per week    Comment: occasional    Drug use: No    Weight 186 pounds  He is awake and alert he is oriented mood and affect are normal  He is ambulatory with a walker.  He walks with a severe slumping of his gait and severely slumped over  He has dorsiflexion plantarflexion of the right foot extension of the right knee but weak hip flexion.  He has no pain that he reports in the groin or the back  The right leg is slightly shorter than the left (I think his hip OA has caused shortening of the left hip causing the right hip to appear less short than it really is    Imaging shows dislodging of the acetabular cup with rotation and then proximal migration of the femoral head prosthesis with projection into the acetabulum on the pelvic x-ray  I ve discussed with him the severe nature of this problem and he will need a referral to a joint reconstructive specialist   I'LL try [place] first.  Meds ordered this encounter  Medications   HYDROcodone-acetaminophen (NORCO/VICODIN) 5-325 MG tablet    Sig: Take 1 tablet by mouth every 4 (four) hours as needed for moderate pain.    Dispense:  30 tablet    Refill:  0    Addendum:  I read this as a LUMBAR 4/5 moderate bilateral foraminal stenosis and multilevel degenerative disc disease  T12-L1: No significant disc bulge. No neural foraminal stenosis. No central canal stenosis.   L1-L2: No significant disc bulge. No neural foraminal stenosis. No central canal stenosis. Mild bilateral facet arthropathy.   L2-L3: Mild broad-based disc bulge. Mild bilateral facet arthropathy. Mild left foraminal stenosis. No right foraminal stenosis. No spinal stenosis.   L3-L4: Mild broad-based disc bulge. Moderate bilateral facet arthropathy with small right facet effusion. Bilateral lateral recess narrowing. No foraminal or central canal stenosis.   L4-L5: Broad-based disc bulge. Moderate bilateral facet arthropathy. Mild spinal stenosis. Severe bilateral foraminal stenosis.   L5-S1: Broad-based disc bulge. Moderate right and mild left facet arthropathy. Mild right foraminal stenosis. No left foraminal stenosis.   IMPRESSION: 1. Diffuse lumbar spine spondylosis as described above. 2. No acute osseous injury of the lumbar spine.     Electronically Signed   By: Kathreen Devoid M.D.   On: 12/08/2021 14:56

## 2021-12-11 NOTE — Progress Notes (Signed)
Dg hip 

## 2021-12-12 DIAGNOSIS — M25551 Pain in right hip: Secondary | ICD-10-CM | POA: Diagnosis not present

## 2021-12-17 ENCOUNTER — Encounter: Payer: Self-pay | Admitting: Urology

## 2021-12-17 ENCOUNTER — Other Ambulatory Visit: Payer: Self-pay | Admitting: Orthopedic Surgery

## 2021-12-17 ENCOUNTER — Ambulatory Visit: Payer: Medicare Other | Admitting: Urology

## 2021-12-17 VITALS — BP 125/69 | HR 123

## 2021-12-17 DIAGNOSIS — Z8546 Personal history of malignant neoplasm of prostate: Secondary | ICD-10-CM

## 2021-12-17 DIAGNOSIS — S73004A Unspecified dislocation of right hip, initial encounter: Secondary | ICD-10-CM

## 2021-12-17 DIAGNOSIS — M25551 Pain in right hip: Secondary | ICD-10-CM

## 2021-12-17 DIAGNOSIS — R3129 Other microscopic hematuria: Secondary | ICD-10-CM

## 2021-12-17 MED ORDER — HYDROCODONE-ACETAMINOPHEN 5-325 MG PO TABS: 1.0000 | ORAL_TABLET | ORAL | 0 refills | Status: DC | PRN

## 2021-12-17 MED ORDER — CIPROFLOXACIN HCL 500 MG PO TABS
500.0000 mg | ORAL_TABLET | Freq: Once | ORAL | Status: AC
  Administered 2021-12-17: 500 mg via ORAL

## 2021-12-17 NOTE — Progress Notes (Signed)
Subjective:  1. Microhematuria   2. History of prostate cancer       [name] [name] returns today in f/u for his history of hypogonadism and prostate cancer. He is s/p a RALP on 04/20/11. His PSA was <0.1 on 10/26/21. He had Gleason 7(3+4) T2c N0 disease.   He had a right knee injury in therapy and hasn't been able to be active or get out much.   He has MUI and was given Myrbetriq samples at his last visit.   It didn't help.   He rarely wear pads.  He has switched his diuretic to bedtime and does better with the  nocturia since he has a delayed response.  He has no SUI but can have UUI but that is rare if he is careful and has noted that once he gets the urge he can only hold it q15-20 min.  He has no hematuria or dysuria. He has a good stream. His IPSS is 11.  UA has 3-10 RBC's today.   He has had some erectile dysfunction that he manages with sildenafil '20mg'$ .   He has not been active in the last 2 months.   He remains on compounded testosterone 4% 3 clicks daily but he is only using it intermittently because of his mobility issues. His T level is 194 and his last Hgb on 04/29/21 was 15.2 which was stable.   He is doing well with improved energy.   12/17/21: [name] [name] returns today for cystoscopy to complete his hematuria w/u.  The CT showed no GU findings but he had a dislocation of the right hip prosthesis.                ROS:  ROS:  A complete review of systems was performed.  All systems are negative except for pertinent findings as noted.   ROS  Allergies  Allergen Reactions   Citalopram     Fatigue headache    Lisinopril Cough    Dry cough    Outpatient Encounter Medications as of 12/17/2021  Medication Sig   atorvastatin (LIPITOR) 40 MG tablet TAKE 1 TABLET BY MOUTH DAILY   allopurinol (ZYLOPRIM) 100 MG tablet TAKE 1 TABLET BY MOUTH DAILY   AMBULATORY NON FORMULARY MEDICATION Medication Name: Testosterone 4% Gel  Apply 3.29JM ( 3 clicks) topically daily as directed    amLODipine (NORVASC) 5 MG tablet TAKE 1 TABLET BY MOUTH DAILY   aspirin 81 MG chewable tablet Chew 81 mg by mouth daily.    B Complex-C (SUPER B COMPLEX PO) Take 1 tablet by mouth daily.   buPROPion (WELLBUTRIN XL) 150 MG 24 hr tablet TAKE 1 TABLET BY MOUTH DAILY -  STOP CITALOPRAM   hydrochlorothiazide (HYDRODIURIL) 25 MG tablet TAKE 1 TABLET BY MOUTH DAILY   Multiple Vitamin (MULTIVITAMIN) tablet Take 1 tablet by mouth daily.   potassium chloride (KLOR-CON M) 10 MEQ tablet TAKE 1 TABLET BY MOUTH DAILY   sildenafil (REVATIO) 20 MG tablet 1-5 po prn   [DISCONTINUED] HYDROcodone-acetaminophen (NORCO/VICODIN) 5-325 MG tablet Take 1 tablet by mouth every 4 (four) hours as needed for moderate pain.   [EXPIRED] ciprofloxacin (CIPRO) tablet 500 mg    No facility-administered encounter medications on file as of 12/17/2021.    Past Medical History:  Diagnosis Date   Aortic atherosclerosis ([place]) 10/19/2016    Seen on chest x-ray July 2018   Arthritis    right knee and hip    Cancer [facility])    prostate cancer  Chronic kidney disease    prostate cancer    Diabetes mellitus without complication ([facility])    GERD (gastroesophageal reflux disease)    Gout    Gout    Hypercholesteremia    Hyperglycemia    Hyperlipidemia    Hypertension    Sleep apnea    Status post THR (total hip replacement) 04/20/13 05/01/2013   Right total hip 04/20/2013 Dr. Lydia Guiles implant     Past Surgical History:  Procedure Laterality Date   COLONOSCOPY     COLONOSCOPY N/A 10/22/2013   Procedure: COLONOSCOPY;  Surgeon: Danie Binder, MD;  [facility];  Service: Endoscopy;  Laterality: N/A;  1115   HERNIA REPAIR     right inguinal hernia repair    LUMBAR LAMINECTOMY/DECOMPRESSION MICRODISCECTOMY Right 03/16/2021   Procedure: Right Lumbar four-five Laminectomy/foraminotomy;  Surgeon: Eustace Moore, MD;  Location: [facility];  Service: Neurosurgery;  Laterality: Right;   NASAL SEPTUM SURGERY     OTHER  SURGICAL HISTORY     deviated septum surgery    ROBOT ASSISTED LAPAROSCOPIC RADICAL PROSTATECTOMY  04/20/2011   Procedure: ROBOTIC ASSISTED LAPAROSCOPIC RADICAL PROSTATECTOMY;  Surgeon: Malka So, MD;  [facility];  Service: Urology;  Laterality: N/A;  Bilateral lymph node dissection   TOTAL HIP ARTHROPLASTY Right 04/20/2013   Procedure: TOTAL HIP ARTHROPLASTY;  Surgeon: Carole Civil, MD;  [facility];  Service: Orthopedics;  Laterality: Right;   TOTAL KNEE ARTHROPLASTY Right 05/16/2018   Procedure: TOTAL KNEE ARTHROPLASTY;  Surgeon: Carole Civil, MD;  Location[facility];  Service: Orthopedics;  Laterality: Right;    Social History   Socioeconomic History   Marital status: Single    Spouse name: Not on file   Number of children: Not on file   Years of education: Not on file   Highest education level: Not on file  Occupational History   Not on file  Tobacco Use   Smoking status: Former    Packs/day: 0.50    Years: 8.00    Total pack years: 4.00    Types: Cigarettes    Quit date: 03/22/1984    Years since quitting: 37.7   Smokeless tobacco: Never  Vaping Use   Vaping Use: Never used  Substance and Sexual Activity   Alcohol use: Yes    Alcohol/week: 1.0 standard drink of alcohol    Types: 1 Cans of beer per week    Comment: occasional    Drug use: No   Sexual activity: Yes    Birth control/protection: None  Other Topics Concern   Not on file  Social History Narrative   Not on file   Social Determinants of Health   Financial Resource Strain: Low Risk  (12/14/2020)   Overall Financial Resource Strain (CARDIA)    Difficulty of Paying Living Expenses: Not hard at all  Food Insecurity: No Food Insecurity (12/14/2020)   Hunger Vital Sign    Worried About Running Out of Food in the Last Year: Never true    Ran Out of Food in the Last Year: Never true  Transportation Needs: No Transportation Needs (12/14/2020)   PRAPARE - Radiographer, therapeutic (Medical): No    Lack of Transportation (Non-Medical): No  Physical Activity: Insufficiently Active (12/14/2020)   Exercise Vital Sign    Days of Exercise per Week: 2 days    Minutes of Exercise per Session: 10 min  Stress: No Stress Concern Present (12/14/2020)  [employer] - Occupational Stress Questionnaire    Feeling of Stress : Not at all  Social Connections: Moderately Isolated (12/14/2020)   Social Connection and Isolation Panel [NHANES]    Frequency of Communication with Friends and Family: More than three times a week    Frequency of Social Gatherings with Friends and Family: More than three times a week    Attends Religious Services: Never    Marine scientist or Organizations: No    Attends Archivist Meetings: Never    Marital Status: Living with partner  Intimate Partner Violence: Not At Risk (12/14/2020)   Humiliation, Afraid, Rape, and Kick questionnaire    Fear of Current or Ex-Partner: No    Emotionally Abused: No    Physically Abused: No    Sexually Abused: No    Family History  Problem Relation Age of Onset   Diabetes Mother    Hypertension Mother    Hypertension Brother        Objective: BP 125/69   Pulse (!) 123     Physical Exam  Lab Results:  No results found for this or any previous visit (from the past 24 hour(s)).   UA has 3-10 RBC's.     BMET No results for input(s): "NA", "K", "CL", "CO2", "GLUCOSE", "BUN", "CREATININE", "CALCIUM" in the last 72 hours. PSA PSA  Date Value Ref Range Status  05/09/2019 <0.1 < OR = 4.0 ng/mL Final    Comment:    The total PSA value from this assay system is  standardized against the WHO standard. The test  result will be approximately 20% lower when compared  to the equimolar-standardized total PSA (Beckman  Coulter). Comparison of serial PSA results should be  interpreted with this fact in mind. . This test was performed using the Siemens   chemiluminescent method. Values obtained from  different assay methods cannot be used interchangeably. PSA levels, regardless of value, should not be interpreted as absolute evidence of the presence or absence of disease.    Testosterone  Date Value Ref Range Status  10/26/2021 194 (L) 264 - 916 ng/dL Final    Comment:    Adult male reference interval is based on a population of healthy nonobese males (BMI <30) between 61 and 3 years old. [place], [place] [phone]. PMID: [id number].   04/29/2021 272 264 - 916 ng/dL Final    Comment:    Adult male reference interval is based on a population of healthy nonobese males (BMI <30) between 70 and 60 years old. [place], [place] [phone]. PMID: [id number].   10/30/2020 285 264 - 916 ng/dL Final    Comment:    Adult male reference interval is based on a population of healthy nonobese males (BMI <30) between 75 and 67 years old. [place], [place] [phone]. PMID: [id number].    Hgb is 14.4.  UA is clear.  No results found for this or any previous visit (from the past 24 hour(s)).      Studies/Results: No results found. DG HIP UNILAT WITH PELVIS 2-3 VIEWS RIGHT  Result Date: 12/15/2021 Pelvis AP lateral right hip Right hip dislocation Right hip femoral prosthesis is intact with normal alignment no signs of loosening Acetabular cup is completely loose and rotated There appears to be false acetabulum superior to the normal position and there may be some erosion into the pelvis Impression Dislocated hip Dislodged acetabular component Femoral component Poss blood protrusio and false acetabulum in the pelvis  CT HEMATURIA WORKUP  Result Date: 12/08/2021 CLINICAL DATA:  Remote history of prostate cancer now with microscopic hematuria EXAM: CT ABDOMEN AND PELVIS WITHOUT AND WITH CONTRAST TECHNIQUE: Multidetector CT imaging of the abdomen and pelvis was performed following the standard protocol before  and following the bolus administration of intravenous contrast. Patient unable to lie prone. RADIATION DOSE REDUCTION: This exam was performed according to the departmental dose-optimization program which includes automated exposure control, adjustment of the mA and/or kV according to patient size and/or use of iterative reconstruction technique. CONTRAST:  154m OMNIPAQUE IOHEXOL 350 MG/ML SOLN COMPARISON:  None Available. FINDINGS: Lower chest: Multifocal ground-glass, tree-in-bud, and solid pulmonary nodules throughout the visualized lung bases measuring up to 5 mm in the perifissural basilar right lower lobe (9:25). 3 mm calcified granuloma in the right lobe and lingula. No pleural effusion or pneumothorax demonstrated. Partially imaged heart size is normal. Coronary artery ossifications. Hepatobiliary: No focal hepatic lesions. No intra or extrahepatic biliary ductal dilation. Normal gallbladder. Pancreas: No focal lesions or main ductal dilation. Spleen: Normal in size without focal abnormality. Adrenals/Urinary Tract: No adrenal nodules. No focal renal lesions, calculi, or hydronephrosis. No focal bladder wall thickening, noting the bladder is somewhat suboptimally evaluated due to artifact from the adjacent right hip arthroplasty. Stomach/Bowel: Normal appearance of the stomach. No evidence of bowel wall thickening, distention, or inflammatory changes. Colonic diverticulosis without acute diverticulitis. Normal appendix. Vascular/Lymphatic: Aortic atherosclerosis without aneurysmal dilation. Smooth contour of the IVC. No enlarged abdominal lymph nodes. Elongated, ovoid structure along the right pelvic sidewall measures 12 mm in short axis dimension (7:74) and may represent an obturator lymph node. Reproductive: Prior prostatectomy. The prostate bed is suboptimally evaluated due to artifact from the adjacent right hip arthroplasty. Other: No free fluid, fluid collection, or free air. Musculoskeletal: No acute  or abnormal lytic or blastic osseous lesions. Postsurgical changes from right hip arthroplasty. The acetabular component is malpositioned and appears rotated 180 degrees relative to the bony acetabulum. The femoral component is posterosuperior dislocated articulates again the posterior right iliac wing. There are severe bilateral degenerative changes of the hips. Multilevel degenerative changes of the partially imaged thoracic and lumbar spine. Small fat-containing paraumbilical hernia. IMPRESSION: 1. No nephrolithiasis or focal renal, ureteral, or bladder lesion, noting the bladder is somewhat suboptimally evaluated due to artifact from the adjacent right hip arthroplasty. 2. Right hip arthroplasty with malpositioning of the acetabular component and posterosuperior dislocation of the femoral component. 3. Elongated ovoid structure along the right pelvic sidewall may represent obturator lymphadenopathy, likely reactive. 4. Scattered multifocal nodules in the lung bases are likely infectious/inflammatory. 5.  Aortic Atherosclerosis (ICD10-I70.0). These results will be called to the ordering clinician or representative by the Radiologist Assistant, and communication documented in the PACS or [employer] Electronically Signed   By: LDarrin NipperM.D.   On: 12/08/2021 15:03   MR Lumbar Spine Wo Contrast  Result Date: 12/08/2021 CLINICAL DATA:  Lower leg weakness for 4 months EXAM: MRI LUMBAR SPINE WITHOUT CONTRAST TECHNIQUE: Multiplanar, multisequence MR imaging of the lumbar spine was performed. No intravenous contrast was administered. COMPARISON:  09/03/2020 FINDINGS: Segmentation:  Standard. Alignment:  Physiologic. Vertebrae: No acute fracture, evidence of discitis, or aggressive bone lesion. Conus medullaris and cauda equina: Conus extends to the L1 level. Conus and cauda equina appear normal. Paraspinal and other soft tissues: No acute paraspinal abnormality. Disc levels: Disc spaces: Degenerative disease  with disc height loss at L4-5. T12-L1: No significant disc bulge. No neural foraminal  stenosis. No central canal stenosis. L1-L2: No significant disc bulge. No neural foraminal stenosis. No central canal stenosis. Mild bilateral facet arthropathy. L2-L3: Mild broad-based disc bulge. Mild bilateral facet arthropathy. Mild left foraminal stenosis. No right foraminal stenosis. No spinal stenosis. L3-L4: Mild broad-based disc bulge. Moderate bilateral facet arthropathy with small right facet effusion. Bilateral lateral recess narrowing. No foraminal or central canal stenosis. L4-L5: Broad-based disc bulge. Moderate bilateral facet arthropathy. Mild spinal stenosis. Severe bilateral foraminal stenosis. L5-S1: Broad-based disc bulge. Moderate right and mild left facet arthropathy. Mild right foraminal stenosis. No left foraminal stenosis. IMPRESSION: 1. Diffuse lumbar spine spondylosis as described above. 2. No acute osseous injury of the lumbar spine. Electronically Signed   By: Kathreen Devoid M.D.   On: 12/08/2021 14:56    Procedure: Cystoscopy.  He was prepped with betadine and 2% lidocaine jelly and was given Cipro '500mg'$ .  The scope passed easily and the urethra is normal.  The external sphincter is intact, The prostate is absent and there is no BNC.  There is mild trabeculation without mucosal lesions. The UO's are normal.      Assessment & Plan: Microhematuria:  Negative w/u.   Hypogonadism.  He is doing well on therapy.    ED.  Sildenafil remains effective.  Hx of prostate cancer.  PSA remains undetectible.  Urgency with UUI and nocturia.   I will try him on Myrbetriq '25mg'$  and gave him samples.   Instructions and side effects reviewed.   His BP is generally lower at home.   He will call if he would like a script.     Meds ordered this encounter  Medications   ciprofloxacin (CIPRO) tablet 500 mg      No orders of the defined types were placed in this encounter.     Return for Keep f/u in  6 months with labs. .   CC: Kathyrn Drown, MD      Irine Seal 12/17/2021 Patient ID: [name] [name], male   DOB: [birth year], 71 y.o.   MRN: [id number]

## 2021-12-17 NOTE — Telephone Encounter (Signed)
Patient called for refill:  HYDROcodone-acetaminophen (NORCO/VICODIN) 5-325 MG tablet 30 tablet       [facility]

## 2021-12-24 ENCOUNTER — Ambulatory Visit (INDEPENDENT_AMBULATORY_CARE_PROVIDER_SITE_OTHER): Payer: Medicare Other

## 2021-12-24 ENCOUNTER — Other Ambulatory Visit: Payer: Self-pay

## 2021-12-24 VITALS — Wt 186.0 lb

## 2021-12-24 DIAGNOSIS — S73004A Unspecified dislocation of right hip, initial encounter: Secondary | ICD-10-CM

## 2021-12-24 DIAGNOSIS — M25551 Pain in right hip: Secondary | ICD-10-CM

## 2021-12-24 DIAGNOSIS — Z0000 Encounter for general adult medical examination without abnormal findings: Secondary | ICD-10-CM

## 2021-12-24 MED ORDER — HYDROCODONE-ACETAMINOPHEN 5-325 MG PO TABS: 1.0000 | ORAL_TABLET | ORAL | 0 refills | Status: DC | PRN

## 2021-12-24 NOTE — Progress Notes (Signed)
Virtual Visit via Telephone Note  I connected with  [name] [name] on 12/24/21 at 10:00 AM EDT by telephone and verified that I am speaking with the correct person using two identifiers.  Location: Patient: home Provider: [facility] Persons participating in the virtual visit: patient/Nurse Health Advisor   I discussed the limitations, risks, security and privacy concerns of performing an evaluation and management service by telephone and the availability of in person appointments. The patient expressed understanding and agreed to proceed.  Interactive audio and video telecommunications were attempted between this nurse and patient, however failed, due to patient having technical difficulties OR patient did not have access to video capability.  We continued and completed visit with audio only.  Some vital signs may be absent or patient reported.   Dionisio David, LPN  Subjective:   [name] [name] is a 71 y.o. male who presents for Medicare Annual/Subsequent preventive examination.  Review of Systems     Cardiac Risk Factors include: advanced age (>54mn, >>34women);male gender;smoking/ tobacco exposure;hypertension     Objective:    There were no vitals filed for this visit. There is no height or weight on file to calculate BMI.     12/24/2021   10:02 AM 07/20/2021   10:44 AM 06/16/2021    9:18 AM 03/16/2021    8:14 AM 12/14/2020    8:21 AM 06/23/2018    2:57 PM 06/01/2018    3:48 PM  Advanced Directives  Does Patient Have a Medical Advance Directive? No No No No No No No  Would patient like information on creating a medical advance directive? No - Patient declined No - Patient declined No - Patient declined No - Patient declined No - Patient declined  No - Patient declined    Current Medications (verified) Outpatient Encounter Medications as of 12/24/2021  Medication Sig   allopurinol (ZYLOPRIM) 100 MG tablet TAKE 1 TABLET BY MOUTH DAILY   AMBULATORY NON FORMULARY MEDICATION  Medication Name: Testosterone 4% Gel  Apply 00.25EN( 3 clicks) topically daily as directed   amLODipine (NORVASC) 5 MG tablet TAKE 1 TABLET BY MOUTH DAILY   aspirin 81 MG chewable tablet Chew 81 mg by mouth daily.    atorvastatin (LIPITOR) 40 MG tablet TAKE 1 TABLET BY MOUTH DAILY   B Complex-C (SUPER B COMPLEX PO) Take 1 tablet by mouth daily.   buPROPion (WELLBUTRIN XL) 150 MG 24 hr tablet TAKE 1 TABLET BY MOUTH DAILY -  STOP CITALOPRAM   hydrochlorothiazide (HYDRODIURIL) 25 MG tablet TAKE 1 TABLET BY MOUTH DAILY   HYDROcodone-acetaminophen (NORCO/VICODIN) 5-325 MG tablet Take 1 tablet by mouth every 4 (four) hours as needed for moderate pain.   Multiple Vitamin (MULTIVITAMIN) tablet Take 1 tablet by mouth daily.   potassium chloride (KLOR-CON M) 10 MEQ tablet TAKE 1 TABLET BY MOUTH DAILY   sildenafil (REVATIO) 20 MG tablet 1-5 po prn   No facility-administered encounter medications on file as of 12/24/2021.    Allergies (verified) Citalopram and Lisinopril   History: Past Medical History:  Diagnosis Date   Aortic atherosclerosis ([place] 10/19/2016    Seen on chest x-ray July 2018   Arthritis    right knee and hip    Cancer ([facility]    prostate cancer    Chronic kidney disease    prostate cancer    Diabetes mellitus without complication ([facility]    GERD (gastroesophageal reflux disease)    Gout    Gout    Hypercholesteremia  Hyperglycemia    Hyperlipidemia    Hypertension    Sleep apnea    Status post THR (total hip replacement) 04/20/13 05/01/2013   Right total hip 04/20/2013 Dr. Lydia Guiles implant    Past Surgical History:  Procedure Laterality Date   COLONOSCOPY     COLONOSCOPY N/A 10/22/2013   Procedure: COLONOSCOPY;  Surgeon: Danie Binder, MD;  [facility];  Service: Endoscopy;  Laterality: N/A;  1115   HERNIA REPAIR     right inguinal hernia repair    LUMBAR LAMINECTOMY/DECOMPRESSION MICRODISCECTOMY Right 03/16/2021   Procedure: Right Lumbar  four-five Laminectomy/foraminotomy;  Surgeon: Eustace Moore, MD;  Location: [facility];  Service: Neurosurgery;  Laterality: Right;   NASAL SEPTUM SURGERY     OTHER SURGICAL HISTORY     deviated septum surgery    ROBOT ASSISTED LAPAROSCOPIC RADICAL PROSTATECTOMY  04/20/2011   Procedure: ROBOTIC ASSISTED LAPAROSCOPIC RADICAL PROSTATECTOMY;  Surgeon: Malka So, MD;  Location: [facility];  Service: Urology;  Laterality: N/A;  Bilateral lymph node dissection   TOTAL HIP ARTHROPLASTY Right 04/20/2013   Procedure: TOTAL HIP ARTHROPLASTY;  Surgeon: Carole Civil, MD;  Location: [facility];  Service: Orthopedics;  Laterality: Right;   TOTAL KNEE ARTHROPLASTY Right 05/16/2018   Procedure: TOTAL KNEE ARTHROPLASTY;  Surgeon: Carole Civil, MD;  Location: [facility];  Service: Orthopedics;  Laterality: Right;   Family History  Problem Relation Age of Onset   Diabetes Mother    Hypertension Mother    Hypertension Brother    Social History   Socioeconomic History   Marital status: Single    Spouse name: Not on file   Number of children: Not on file   Years of education: Not on file   Highest education level: Not on file  Occupational History   Not on file  Tobacco Use   Smoking status: Former    Packs/day: 0.50    Years: 8.00    Total pack years: 4.00    Types: Cigarettes    Quit date: 03/22/1984    Years since quitting: 37.7   Smokeless tobacco: Never  Vaping Use   Vaping Use: Never used  Substance and Sexual Activity   Alcohol use: Yes    Alcohol/week: 1.0 standard drink of alcohol    Types: 1 Cans of beer per week    Comment: occasional    Drug use: No   Sexual activity: Yes    Birth control/protection: None  Other Topics Concern   Not on file  Social History Narrative   Not on file   Social Determinants of Health   Financial Resource Strain: Low Risk  (12/24/2021)   Overall Financial Resource Strain (CARDIA)    Difficulty of Paying Living Expenses: Not hard at all  Food  Insecurity: No Food Insecurity (12/24/2021)   Hunger Vital Sign    Worried About Running Out of Food in the Last Year: Never true    Ran Out of Food in the Last Year: Never true  Transportation Needs: No Transportation Needs (12/24/2021)   PRAPARE - Hydrologist (Medical): No    Lack of Transportation (Non-Medical): No  Physical Activity: Insufficiently Active (12/24/2021)   Exercise Vital Sign    Days of Exercise per Week: 4 days    Minutes of Exercise per Session: 20 min  Stress: No Stress Concern Present (12/24/2021)   [place]    Feeling of Stress :  Not at all  Social Connections: Moderately Integrated (12/24/2021)   Social Connection and Isolation Panel [NHANES]    Frequency of Communication with Friends and Family: Three times a week    Frequency of Social Gatherings with Friends and Family: Once a week    Attends Religious Services: More than 4 times per year    Active Member of [employer] or Organizations: No    Attends Music therapist: Never    Marital Status: Living with partner    Tobacco Counseling Counseling given: Not Answered   Clinical Intake:  Pre-visit preparation completed: Yes  Pain : No/denies pain     Nutritional Risks: None Diabetes: No  How often do you need to have someone help you when you read instructions, pamphlets, or other written materials from your doctor or pharmacy?: 1 - Never  Diabetic?no  Interpreter Needed?: No  Information entered by :: Kirke Shaggy, LPN   Activities of Daily Living    12/24/2021   10:03 AM 03/16/2021    8:22 AM  In your present state of health, do you have any difficulty performing the following activities:  Hearing? 0   Vision? 0   Difficulty concentrating or making decisions? 0   Walking or climbing stairs? 1   Dressing or bathing? 0   Doing errands, shopping? 0 0  Preparing Food and eating ? Y    Using the Toilet? N   In the past six months, have you accidently leaked urine? N   Do you have problems with loss of bowel control? N   Managing your Medications? N   Managing your Finances? N   Housekeeping or managing your Housekeeping? Y     Patient Care Team: Kathyrn Drown, MD as PCP - General (Family Medicine) Josue Hector, MD as PCP - Cardiology (Cardiology)  Indicate any recent Medical Services you may have received from other than [facility] providers in the past year (date may be approximate).     Assessment:   This is a routine wellness examination for [place].  Hearing/Vision screen Hearing Screening - Comments:: No aids Vision Screening - Comments:: Wears glasses- [facility] in [place] issues and exercise activities discussed: Current Exercise Habits: Home exercise routine, Type of exercise: walking, Time (Minutes): 20, Frequency (Times/Week): 4, Weekly Exercise (Minutes/Week): 80, Intensity: Mild   Goals Addressed             This Visit's Progress    DIET - EAT MORE FRUITS AND VEGETABLES         Depression Screen    12/24/2021   10:01 AM 06/02/2021   10:31 AM 12/14/2020    8:19 AM 12/03/2020   10:14 AM 06/02/2020    3:06 PM 11/01/2019    3:33 PM 10/30/2018    4:25 PM  PHQ 2/9 Scores  PHQ - 2 Score 2 5 0 0 0 5 3  PHQ- 9 Score '4 9   2 8 5    '$ Fall Risk    12/24/2021   10:03 AM 12/14/2020    8:21 AM 12/03/2020   10:14 AM 06/02/2020    1:10 PM 11/01/2019    2:31 PM  Fall Risk   Falls in the past year? 0 0 0  0  Number falls in past yr: 0 0 0 0   Injury with Fall? 0 0 0    Risk for fall due to : No Fall Risks No Fall Risks No Fall Risks    Follow  up Falls prevention discussed;Falls evaluation completed Falls evaluation completed Falls evaluation completed Falls evaluation completed Falls evaluation completed    FALL RISK PREVENTION PERTAINING TO THE HOME:  Any stairs in or around the home? No  If so, are there any without handrails?  Yes  Home free of loose throw rugs in walkways, pet beds, electrical cords, etc? Yes  Adequate lighting in your home to reduce risk of falls? Yes   ASSISTIVE DEVICES UTILIZED TO PREVENT FALLS:  Life alert? No  Use of a cane, walker or w/c? Yes  Grab bars in the bathroom? Yes  Shower chair or bench in shower? No  Elevated toilet seat or a handicapped toilet? No    Cognitive Function:        12/24/2021   10:04 AM 12/14/2020    8:23 AM  6CIT Screen  What Year? 0 points 0 points  What month? 0 points 0 points  What time? 0 points 0 points  Count back from 20 0 points 0 points  Months in reverse 0 points 0 points  Repeat phrase 0 points 0 points  Total Score 0 points 0 points    Immunizations Immunization History  Administered Date(s) Administered   Fluad Quad(high Dose 65+) 12/03/2020, 12/03/2021   Influenza,inj,Quad PF,6+ Mos 01/17/2014, 12/12/2014, 01/19/2016, 01/18/2017, 01/19/2018, 01/09/2019   Influenza-Unspecified 01/04/2020   Moderna Sars-Covid-2 Vaccination 05/03/2019, 06/01/2019, 01/19/2020   Pneumococcal Conjugate-13 01/17/2014   Pneumococcal Polysaccharide-23 03/04/2015   Zoster Recombinat (Shingrix) 11/11/2017, 04/12/2018   Zoster, Live 03/20/2014    TDAP status: Due, Education has been provided regarding the importance of this vaccine. Advised may receive this vaccine at local pharmacy or Health Dept. Aware to provide a copy of the vaccination record if obtained from local pharmacy or Health Dept. Verbalized acceptance and understanding.  Flu Vaccine status: Up to date  Pneumococcal vaccine status: Up to date  Covid-19 vaccine status: Completed vaccines  Qualifies for Shingles Vaccine? Yes   Zostavax completed Yes   Shingrix Completed?: Yes  Screening Tests Health Maintenance  Topic Date Due   TETANUS/TDAP  Never done   OPHTHALMOLOGY EXAM  04/22/2018   COVID-19 Vaccine (4 - Moderna risk series) 03/15/2020   HEMOGLOBIN A1C  05/26/2021   FOOT EXAM   12/03/2021   Diabetic kidney evaluation - Urine ACR  05/29/2022   Diabetic kidney evaluation - GFR measurement  12/03/2022   COLONOSCOPY (Pts 45-86yr Insurance coverage will need to be confirmed)  10/23/2023   Pneumonia Vaccine 71 Years old  Completed   INFLUENZA VACCINE  Completed   Hepatitis C Screening  Completed   Zoster Vaccines- Shingrix  Completed   HPV VACCINES  Aged Out    Health Maintenance  Health Maintenance Due  Topic Date Due   TETANUS/TDAP  Never done   OPHTHALMOLOGY EXAM  04/22/2018   COVID-19 Vaccine (4 - Moderna risk series) 03/15/2020   HEMOGLOBIN A1C  05/26/2021   FOOT EXAM  12/03/2021    Colorectal cancer screening: Type of screening: Colonoscopy. Completed 10/22/13. Repeat every 10 years  Lung Cancer Screening: (Low Dose CT Chest recommended if Age 73-80years, 30 pack-year currently smoking OR have quit w/in 15years.) does not qualify.   Additional Screening:  Hepatitis C Screening: does qualify; Completed 03/26/13  Vision Screening: Recommended annual ophthalmology exams for early detection of glaucoma and other disorders of the eye. Is the patient up to date with their annual eye exam?  Yes  Who is the provider or what is the name of  the office in which the patient attends annual eye exams? [facility] If pt is not established with a provider, would they like to be referred to a provider to establish care? No .   Dental Screening: Recommended annual dental exams for proper oral hygiene  Community Resource Referral / Chronic Care Management: CRR required this visit?  No   CCM required this visit?  No      Plan:     I have personally reviewed and noted the following in the patient's chart:   Medical and social history Use of alcohol, tobacco or illicit drugs  Current medications and supplements including opioid prescriptions. Patient is currently taking opioid prescriptions. Information provided to patient regarding non-opioid alternatives.  Patient advised to discuss non-opioid treatment plan with their provider. Functional ability and status Nutritional status Physical activity Advanced directives List of other physicians Hospitalizations, surgeries, and ER visits in previous 12 months Vitals Screenings to include cognitive, depression, and falls Referrals and appointments  In addition, I have reviewed and discussed with patient certain preventive protocols, quality metrics, and best practice recommendations. A written personalized care plan for preventive services as well as general preventive health recommendations were provided to patient.     Dionisio David, LPN   [id number]   Nurse Notes: none

## 2021-12-24 NOTE — Telephone Encounter (Signed)
He has appointment at [facility] for next week  Needs refill on Hydrocodone

## 2021-12-24 NOTE — Telephone Encounter (Signed)
Hydrocodone-Acetaminophen 5/325 MG Qty 30 Tablets    Take 1 tablet by mouth every 4 (four) hours as needed for moderate pain.  PATIENT USES [facility]                             

## 2021-12-24 NOTE — Patient Instructions (Signed)
[name] , Thank you for taking time to come for your Medicare Wellness Visit. I appreciate your ongoing commitment to your health goals. Please review the following plan we discussed and let me know if I can assist you in the future.   Screening recommendations/referrals: Colonoscopy: 10/22/13 Recommended yearly ophthalmology/optometry visit for glaucoma screening and checkup Recommended yearly dental visit for hygiene and checkup  Vaccinations: Influenza vaccine: 12/03/21 Pneumococcal vaccine: 03/04/15 Tdap vaccine: n/d Shingles vaccine: Zostavax 03/20/14   Shingrix 11/11/17, 04/12/18   Covid-19: 05/03/19, 06/01/19, 01/19/20  Advanced directives: no  Conditions/risks identified: none  Next appointment: Follow up in one year for your annual wellness visit. 12/28/22 @ 1:30 pm by phone  Preventive Care 65 Years and Older, Male Preventive care refers to lifestyle choices and visits with your health care provider that can promote health and wellness. What does preventive care include? A yearly physical exam. This is also called an annual well check. Dental exams once or twice a year. Routine eye exams. Ask your health care provider how often you should have your eyes checked. Personal lifestyle choices, including: Daily care of your teeth and gums. Regular physical activity. Eating a healthy diet. Avoiding tobacco and drug use. Limiting alcohol use. Practicing safe sex. Taking low doses of aspirin every day. Taking vitamin and mineral supplements as recommended by your health care provider. What happens during an annual well check? The services and screenings done by your health care provider during your annual well check will depend on your age, overall health, lifestyle risk factors, and family history of disease. Counseling  Your health care provider may ask you questions about your: Alcohol use. Tobacco use. Drug use. Emotional well-being. Home and relationship  well-being. Sexual activity. Eating habits. History of falls. Memory and ability to understand (cognition). Work and work Statistician. Screening  You may have the following tests or measurements: Height, weight, and BMI. Blood pressure. Lipid and cholesterol levels. These may be checked every 5 years, or more frequently if you are over 42 years old. Skin check. Lung cancer screening. You may have this screening every year starting at age 52 if you have a 30-pack-year history of smoking and currently smoke or have quit within the past 15 years. Fecal occult blood test (FOBT) of the stool. You may have this test every year starting at age 71. Flexible sigmoidoscopy or colonoscopy. You may have a sigmoidoscopy every 5 years or a colonoscopy every 10 years starting at age 35. Prostate cancer screening. Recommendations will vary depending on your family history and other risks. Hepatitis C blood test. Hepatitis B blood test. Sexually transmitted disease (STD) testing. Diabetes screening. This is done by checking your blood sugar (glucose) after you have not eaten for a while (fasting). You may have this done every 1-3 years. Abdominal aortic aneurysm (AAA) screening. You may need this if you are a current or former smoker. Osteoporosis. You may be screened starting at age 59 if you are at high risk. Talk with your health care provider about your test results, treatment options, and if necessary, the need for more tests. Vaccines  Your health care provider may recommend certain vaccines, such as: Influenza vaccine. This is recommended every year. Tetanus, diphtheria, and acellular pertussis (Tdap, Td) vaccine. You may need a Td booster every 10 years. Zoster vaccine. You may need this after age 25. Pneumococcal 13-valent conjugate (PCV13) vaccine. One dose is recommended after age 51. Pneumococcal polysaccharide (PPSV23) vaccine. One dose is recommended after age  59. Talk to your health care  provider about which screenings and vaccines you need and how often you need them. This information is not intended to replace advice given to you by your health care provider. Make sure you discuss any questions you have with your health care provider. Document Released: 04/04/2015 Document Revised: 11/26/2015 Document Reviewed: 01/07/2015 Elsevier Interactive Patient Education  2017 [place] Prevention in the Home Falls can cause injuries. They can happen to people of all ages. There are many things you can do to make your home safe and to help prevent falls. What can I do on the outside of my home? Regularly fix the edges of walkways and driveways and fix any cracks. Remove anything that might make you trip as you walk through a door, such as a raised step or threshold. Trim any bushes or trees on the path to your home. Use bright outdoor lighting. Clear any walking paths of anything that might make someone trip, such as rocks or tools. Regularly check to see if handrails are loose or broken. Make sure that both sides of any steps have handrails. Any raised decks and porches should have guardrails on the edges. Have any leaves, snow, or ice cleared regularly. Use sand or salt on walking paths during winter. Clean up any spills in your garage right away. This includes oil or grease spills. What can I do in the bathroom? Use night lights. Install grab bars by the toilet and in the tub and shower. Do not use towel bars as grab bars. Use non-skid mats or decals in the tub or shower. If you need to sit down in the shower, use a plastic, non-slip stool. Keep the floor dry. Clean up any water that spills on the floor as soon as it happens. Remove soap buildup in the tub or shower regularly. Attach bath mats securely with double-sided non-slip rug tape. Do not have throw rugs and other things on the floor that can make you trip. What can I do in the bedroom? Use night lights. Make  sure that you have a light by your bed that is easy to reach. Do not use any sheets or blankets that are too big for your bed. They should not hang down onto the floor. Have a firm chair that has side arms. You can use this for support while you get dressed. Do not have throw rugs and other things on the floor that can make you trip. What can I do in the kitchen? Clean up any spills right away. Avoid walking on wet floors. Keep items that you use a lot in easy-to-reach places. If you need to reach something above you, use a strong step stool that has a grab bar. Keep electrical cords out of the way. Do not use floor polish or wax that makes floors slippery. If you must use wax, use non-skid floor wax. Do not have throw rugs and other things on the floor that can make you trip. What can I do with my stairs? Do not leave any items on the stairs. Make sure that there are handrails on both sides of the stairs and use them. Fix handrails that are broken or loose. Make sure that handrails are as long as the stairways. Check any carpeting to make sure that it is firmly attached to the stairs. Fix any carpet that is loose or worn. Avoid having throw rugs at the top or bottom of the stairs. If you do have  throw rugs, attach them to the floor with carpet tape. Make sure that you have a light switch at the top of the stairs and the bottom of the stairs. If you do not have them, ask someone to add them for you. What else can I do to help prevent falls? Wear shoes that: Do not have high heels. Have rubber bottoms. Are comfortable and fit you well. Are closed at the toe. Do not wear sandals. If you use a stepladder: Make sure that it is fully opened. Do not climb a closed stepladder. Make sure that both sides of the stepladder are locked into place. Ask someone to hold it for you, if possible. Clearly mark and make sure that you can see: Any grab bars or handrails. First and last steps. Where the  edge of each step is. Use tools that help you move around (mobility aids) if they are needed. These include: Canes. Walkers. Scooters. Crutches. Turn on the lights when you go into a dark area. Replace any light bulbs as soon as they burn out. Set up your furniture so you have a clear path. Avoid moving your furniture around. If any of your floors are uneven, fix them. If there are any pets around you, be aware of where they are. Review your medicines with your doctor. Some medicines can make you feel dizzy. This can increase your chance of falling. Ask your doctor what other things that you can do to help prevent falls. This information is not intended to replace advice given to you by your health care provider. Make sure you discuss any questions you have with your health care provider. Document Released: 01/02/2009 Document Revised: 08/14/2015 Document Reviewed: 04/12/2014 Elsevier Interactive Patient Education  2017 [employer].

## 2021-12-27 ENCOUNTER — Encounter: Payer: Self-pay | Admitting: Family Medicine

## 2021-12-30 DIAGNOSIS — Z96641 Presence of right artificial hip joint: Secondary | ICD-10-CM | POA: Diagnosis not present

## 2021-12-30 DIAGNOSIS — M1612 Unilateral primary osteoarthritis, left hip: Secondary | ICD-10-CM | POA: Diagnosis not present

## 2021-12-30 DIAGNOSIS — T84010A Broken internal right hip prosthesis, initial encounter: Secondary | ICD-10-CM | POA: Diagnosis not present

## 2021-12-30 DIAGNOSIS — Z471 Aftercare following joint replacement surgery: Secondary | ICD-10-CM | POA: Diagnosis not present

## 2021-12-31 ENCOUNTER — Other Ambulatory Visit: Payer: Self-pay | Admitting: Orthopedic Surgery

## 2021-12-31 DIAGNOSIS — M25551 Pain in right hip: Secondary | ICD-10-CM

## 2021-12-31 DIAGNOSIS — S73004A Unspecified dislocation of right hip, initial encounter: Secondary | ICD-10-CM

## 2021-12-31 MED ORDER — HYDROCODONE-ACETAMINOPHEN 5-325 MG PO TABS: 1.0000 | ORAL_TABLET | ORAL | 0 refills | Status: DC | PRN

## 2021-12-31 NOTE — Telephone Encounter (Signed)
Patient requests refill  HYDROcodone-acetaminophen (NORCO/VICODIN) 5-325 MG tablet 30 tablet      [facility]

## 2022-01-01 DIAGNOSIS — M1612 Unilateral primary osteoarthritis, left hip: Secondary | ICD-10-CM | POA: Insufficient documentation

## 2022-01-01 DIAGNOSIS — Z96649 Presence of unspecified artificial hip joint: Secondary | ICD-10-CM | POA: Insufficient documentation

## 2022-01-07 ENCOUNTER — Other Ambulatory Visit: Payer: Self-pay | Admitting: Orthopedic Surgery

## 2022-01-07 DIAGNOSIS — M25551 Pain in right hip: Secondary | ICD-10-CM

## 2022-01-07 DIAGNOSIS — S73004A Unspecified dislocation of right hip, initial encounter: Secondary | ICD-10-CM

## 2022-01-07 MED ORDER — HYDROCODONE-ACETAMINOPHEN 5-325 MG PO TABS: 1.0000 | ORAL_TABLET | ORAL | 0 refills | Status: DC | PRN

## 2022-01-07 NOTE — Telephone Encounter (Signed)
Call received from patient via voice message for refill  HYDROcodone-acetaminophen (NORCO/VICODIN) 5-325 MG tablet 30 tablet       [facility]

## 2022-01-12 ENCOUNTER — Telehealth: Payer: Self-pay | Admitting: Family Medicine

## 2022-01-12 DIAGNOSIS — R6889 Other general symptoms and signs: Secondary | ICD-10-CM

## 2022-01-12 NOTE — Telephone Encounter (Signed)
Nurses-please talk with patient let them know that the home nurse did a peripheral arterial test and it rated as low.  We would recommend doing a ABI test at the hospital to validate or make sure that this is a true finding If patient is open to this please set him up for ABIs of both legs related to peripheral artery disease Depending on the results we may need to do other intervention thank you

## 2022-01-12 NOTE — Telephone Encounter (Signed)
[employer] Nurse did a home visit today . She did a PAD test right foot was 0.57 and left foot was 0.54

## 2022-01-13 ENCOUNTER — Other Ambulatory Visit: Payer: Self-pay

## 2022-01-13 DIAGNOSIS — Z471 Aftercare following joint replacement surgery: Secondary | ICD-10-CM | POA: Diagnosis not present

## 2022-01-13 DIAGNOSIS — Z96641 Presence of right artificial hip joint: Secondary | ICD-10-CM | POA: Diagnosis not present

## 2022-01-13 NOTE — Telephone Encounter (Signed)
Left message for patient to return the call for additional details and recommendations.   

## 2022-01-14 ENCOUNTER — Telehealth: Payer: Self-pay | Admitting: Orthopedic Surgery

## 2022-01-14 DIAGNOSIS — S73004A Unspecified dislocation of right hip, initial encounter: Secondary | ICD-10-CM

## 2022-01-14 DIAGNOSIS — M25551 Pain in right hip: Secondary | ICD-10-CM

## 2022-01-14 MED ORDER — HYDROCODONE-ACETAMINOPHEN 5-325 MG PO TABS: 1.0000 | ORAL_TABLET | ORAL | 0 refills | Status: DC | PRN

## 2022-01-14 NOTE — Telephone Encounter (Signed)
Patient called to request refill - aware that while Dr Aline Brochure is out of clinic that requests are being reviewed by our other providers.  HYDROcodone-acetaminophen (NORCO/VICODIN) 5-325 MG tablet 30 tablet      Pharmacy: [facility]  *patient also asked about a form for handicapped placard - form placed in Dr Ruthe Mannan box for review and signing.

## 2022-01-15 NOTE — Telephone Encounter (Signed)
Reason to get the ABI and hopefully before surgery so if we see abnormal results we can at least inform Dr. Aline Brochure thank you

## 2022-01-15 NOTE — Telephone Encounter (Signed)
Patient notified of provider's recommendations and agree to further testing. ABI ordered in Epic. Patient also wanted to let you know he has surgery coming up soon with Dr Aline Brochure for a hip dislocation

## 2022-01-21 ENCOUNTER — Other Ambulatory Visit: Payer: Self-pay | Admitting: Radiology

## 2022-01-21 ENCOUNTER — Telehealth: Payer: Self-pay | Admitting: Family Medicine

## 2022-01-21 DIAGNOSIS — M25551 Pain in right hip: Secondary | ICD-10-CM

## 2022-01-21 DIAGNOSIS — S73004A Unspecified dislocation of right hip, initial encounter: Secondary | ICD-10-CM

## 2022-01-21 MED ORDER — HYDROCODONE-ACETAMINOPHEN 5-325 MG PO TABS: 1.0000 | ORAL_TABLET | ORAL | 0 refills | Status: DC | PRN

## 2022-01-21 NOTE — Addendum Note (Signed)
Addended by: Brand Males E on: 01/21/2022 10:51 AM   Modules accepted: Orders

## 2022-01-21 NOTE — Telephone Encounter (Signed)
[place] ABI scheduled for 01/25/22 @ [facility]; arrive at 10:15 am. Pt contacted and verbalized understanding.

## 2022-01-21 NOTE — Telephone Encounter (Signed)
Patient requests refill hydrocodone to [facility].

## 2022-01-22 ENCOUNTER — Other Ambulatory Visit: Payer: Self-pay | Admitting: Family Medicine

## 2022-01-25 ENCOUNTER — Ambulatory Visit (HOSPITAL_COMMUNITY)
Admission: RE | Admit: 2022-01-25 | Discharge: 2022-01-25 | Disposition: A | Discharge status: Home / Self Care | Payer: Medicare Other | Source: Ambulatory Visit | Attending: Family Medicine | Admitting: Family Medicine

## 2022-01-25 DIAGNOSIS — R6889 Other general symptoms and signs: Secondary | ICD-10-CM | POA: Insufficient documentation

## 2022-01-25 DIAGNOSIS — Z0389 Encounter for observation for other suspected diseases and conditions ruled out: Secondary | ICD-10-CM | POA: Diagnosis not present

## 2022-01-28 ENCOUNTER — Other Ambulatory Visit: Payer: Self-pay | Admitting: Orthopedic Surgery

## 2022-01-28 DIAGNOSIS — M25551 Pain in right hip: Secondary | ICD-10-CM

## 2022-01-28 DIAGNOSIS — S73004A Unspecified dislocation of right hip, initial encounter: Secondary | ICD-10-CM

## 2022-01-28 MED ORDER — HYDROCODONE-ACETAMINOPHEN 5-325 MG PO TABS: 1.0000 | ORAL_TABLET | ORAL | 0 refills | Status: DC | PRN

## 2022-01-28 NOTE — Telephone Encounter (Signed)
Patient lvm requesting a refill on his medication.  I called him back to confirm what medication he was requesting, he would like a refill for Hydrocodone '5mg'$ , quantity 30, one every four hours to be sent to [facility].  Pt's # [phone]

## 2022-01-28 NOTE — Progress Notes (Signed)
Meds ordered this encounter  Medications   HYDROcodone-acetaminophen (NORCO/VICODIN) 5-325 MG tablet    Sig: Take 1 tablet by mouth every 4 (four) hours as needed for moderate pain.    Dispense:  30 tablet    Refill:  0  '

## 2022-02-02 NOTE — Addendum Note (Signed)
Addended by: Dairl Ponder on: 02/02/2022 09:39 AM   Modules accepted: Orders

## 2022-02-04 ENCOUNTER — Other Ambulatory Visit: Payer: Self-pay

## 2022-02-04 DIAGNOSIS — S73004A Unspecified dislocation of right hip, initial encounter: Secondary | ICD-10-CM

## 2022-02-04 DIAGNOSIS — M25551 Pain in right hip: Secondary | ICD-10-CM

## 2022-02-04 MED ORDER — HYDROCODONE-ACETAMINOPHEN 5-325 MG PO TABS: 1.0000 | ORAL_TABLET | ORAL | 0 refills | Status: DC | PRN

## 2022-02-04 NOTE — Telephone Encounter (Signed)
Hydrocodone-Acetaminophen 5/325 MG Qty 30 Tablets    Take 1 tablet by mouth every 4 (four) hours as needed for moderate pain.  PATIENT USES [facility]

## 2022-02-15 ENCOUNTER — Other Ambulatory Visit: Payer: Self-pay | Admitting: Family Medicine

## 2022-02-15 ENCOUNTER — Telehealth: Payer: Self-pay | Admitting: Orthopedic Surgery

## 2022-02-15 NOTE — Telephone Encounter (Signed)
Patient lvm to check on a refill request.  I have lvm for the patient to call back.  Pt's # [phone]

## 2022-02-16 ENCOUNTER — Other Ambulatory Visit: Payer: Self-pay | Admitting: Orthopedic Surgery

## 2022-02-16 DIAGNOSIS — S73004A Unspecified dislocation of right hip, initial encounter: Secondary | ICD-10-CM

## 2022-02-16 DIAGNOSIS — M25551 Pain in right hip: Secondary | ICD-10-CM

## 2022-02-16 NOTE — Telephone Encounter (Signed)
Patient called, lvm stating that his pharmacy does not have the medication in store.  He would like it called to [facility] on [address].  Pt's # [phone]

## 2022-02-17 MED ORDER — HYDROCODONE-ACETAMINOPHEN 5-325 MG PO TABS: 1.0000 | ORAL_TABLET | ORAL | 0 refills | Status: DC | PRN

## 2022-02-23 ENCOUNTER — Other Ambulatory Visit: Payer: Self-pay | Admitting: Orthopedic Surgery

## 2022-02-23 DIAGNOSIS — S73004A Unspecified dislocation of right hip, initial encounter: Secondary | ICD-10-CM

## 2022-02-23 DIAGNOSIS — M25551 Pain in right hip: Secondary | ICD-10-CM

## 2022-02-23 MED ORDER — HYDROCODONE-ACETAMINOPHEN 5-325 MG PO TABS: 1.0000 | ORAL_TABLET | ORAL | 0 refills | Status: DC | PRN

## 2022-02-23 NOTE — Telephone Encounter (Signed)
Patient called, lvm requesting a refill on his Hydrocodone 5-325, 30 tablets, one every four hours called to [facility] on [address].  Pt's # [phone]

## 2022-02-24 DIAGNOSIS — M1612 Unilateral primary osteoarthritis, left hip: Secondary | ICD-10-CM | POA: Diagnosis not present

## 2022-02-24 DIAGNOSIS — M25551 Pain in right hip: Secondary | ICD-10-CM | POA: Diagnosis not present

## 2022-02-24 DIAGNOSIS — T84010D Broken internal right hip prosthesis, subsequent encounter: Secondary | ICD-10-CM | POA: Diagnosis not present

## 2022-02-24 DIAGNOSIS — M25552 Pain in left hip: Secondary | ICD-10-CM | POA: Diagnosis not present

## 2022-02-24 DIAGNOSIS — Z96641 Presence of right artificial hip joint: Secondary | ICD-10-CM | POA: Diagnosis not present

## 2022-03-03 ENCOUNTER — Encounter: Payer: Medicare Other | Admitting: Vascular Surgery

## 2022-03-04 ENCOUNTER — Other Ambulatory Visit: Payer: Self-pay | Admitting: Orthopedic Surgery

## 2022-03-04 DIAGNOSIS — M25551 Pain in right hip: Secondary | ICD-10-CM

## 2022-03-04 DIAGNOSIS — S73004A Unspecified dislocation of right hip, initial encounter: Secondary | ICD-10-CM

## 2022-03-04 MED ORDER — HYDROCODONE-ACETAMINOPHEN 5-325 MG PO TABS: 1.0000 | ORAL_TABLET | ORAL | 0 refills | Status: DC | PRN

## 2022-03-04 NOTE — Telephone Encounter (Signed)
Patient called requesting a refill for his pain medicine    HYDROcodone-acetaminophen (NORCO/VICODIN) 5-325 MG tablet    Pharmacy:  [facility]

## 2022-03-08 ENCOUNTER — Encounter: Payer: Self-pay | Admitting: Orthopedic Surgery

## 2022-03-08 ENCOUNTER — Ambulatory Visit: Payer: Medicare Other | Admitting: Orthopedic Surgery

## 2022-03-08 VITALS — BP 154/60 | HR 68 | Ht 67.0 in | Wt 185.0 lb

## 2022-03-08 DIAGNOSIS — T84498D Other mechanical complication of other internal orthopedic devices, implants and grafts, subsequent encounter: Secondary | ICD-10-CM

## 2022-03-08 DIAGNOSIS — M1612 Unilateral primary osteoarthritis, left hip: Secondary | ICD-10-CM

## 2022-03-08 NOTE — Progress Notes (Signed)
Chief Complaint  Patient presents with   Hip Pain    LT//seen by Dr.Wilson for RT hip//needs THA RT but can't bear wt x 6 weeks after surgery. Dr. Redmond Pulling advised him to have LT hip done first then return to him for RT THA   Encounter Diagnoses  Name Primary?   Arthritis of left hip Yes   Failed orthopedic implant, subsequent encounter. right hip dislocation    He saw  Marygrace Drought, MD  [address]  [place], [place] [address]  [phone] (Work)  [phone] (Fax)   I discussed this with Mr. Naaman Plummer.  His left hip needs extensive reconstruction  This is out of my wheelhouse so to speak; I explained this to Mr. Bare.  His main issue is that he does not have a ride and cannot drive to [place]   I showed him some acetabular cages which will probably be needed for both hips  He understands that.  I called Dr. Dois Davenport office to try to get a call back to discuss this with him further.  Hopefully he can do both hips

## 2022-03-10 ENCOUNTER — Encounter: Payer: Self-pay | Admitting: Vascular Surgery

## 2022-03-10 ENCOUNTER — Ambulatory Visit: Payer: Medicare Other | Admitting: Vascular Surgery

## 2022-03-10 VITALS — BP 163/81 | HR 95 | Temp 98.4°F | Ht 67.0 in | Wt 185.0 lb

## 2022-03-10 DIAGNOSIS — I739 Peripheral vascular disease, unspecified: Secondary | ICD-10-CM | POA: Diagnosis not present

## 2022-03-10 NOTE — Progress Notes (Signed)
Vascular and Vein Specialist of [place]  Patient name: [name] [name] MRN: [id number] DOB: Dec 01, 1950 Sex: male  REASON FOR CONSULT: Evaluation lower extremity arterial insufficiency  HPI: [name] [name] is a 71 y.o. male, who is here today for evaluation of lower extremity arterial insufficiency and discussion of recent noninvasive studies in his lower extremities.  Has an extensive history of degenerative joint disease.  He has had total knee replacement on the right and hip replacement on the right.  He apparently has indications for left hip replacement and also left knee replacement.  He has had complication in the right hip with dislocation of the prosthesis and is contemplating the timing of left hip replacement and redo right hip replacement.  He recently underwent noninvasive study showing moderate lower extremity arterial insufficiency and is here today for discussion of this.  He does not have any history of arterial rest pain.  His only leg pain is when he attempts to walk and is not particularly rising from sitting in his joints.  He has no history of lower extremity tissue loss.  Does have dependent rubor in both feet with some mild swelling bilaterally.  He is a former cigarette smoker quit many years ago.  Past Medical History:  Diagnosis Date   Aortic atherosclerosis ([place]) 10/19/2016    Seen on chest x-ray July 2018   Arthritis    right knee and hip    Cancer ([place])    prostate cancer    Chronic kidney disease    prostate cancer    Diabetes mellitus without complication (HCC)    GERD (gastroesophageal reflux disease)    Gout    Gout    Hypercholesteremia    Hyperglycemia    Hyperlipidemia    Hypertension    Sleep apnea    Status post THR (total hip replacement) 04/20/13 05/01/2013   Right total hip 04/20/2013 Dr. Lydia Guiles implant     Family History  Problem Relation Age of Onset   Diabetes Mother    Hypertension  Mother    Hypertension Brother     SOCIAL HISTORY: Social History   Socioeconomic History   Marital status: Single    Spouse name: Not on file   Number of children: Not on file   Years of education: Not on file   Highest education level: Not on file  Occupational History   Not on file  Tobacco Use   Smoking status: Former    Packs/day: 0.50    Years: 8.00    Total pack years: 4.00    Types: Cigarettes    Quit date: 03/22/1984    Years since quitting: 37.9   Smokeless tobacco: Never  Vaping Use   Vaping Use: Never used  Substance and Sexual Activity   Alcohol use: Yes    Alcohol/week: 1.0 standard drink of alcohol    Types: 1 Cans of beer per week    Comment: occasional    Drug use: No   Sexual activity: Yes    Birth control/protection: None  Other Topics Concern   Not on file  Social History Narrative   Not on file   Social Determinants of Health   Financial Resource Strain: Low Risk  (12/24/2021)   Overall Financial Resource Strain (CARDIA)    Difficulty of Paying Living Expenses: Not hard at all  Food Insecurity: No Food Insecurity (12/24/2021)   Hunger Vital Sign    Worried About Running Out of Food in the Last Year:  Never true    Ran Out of Food in the Last Year: Never true  Transportation Needs: No Transportation Needs (12/24/2021)   PRAPARE - Hydrologist (Medical): No    Lack of Transportation (Non-Medical): No  Physical Activity: Insufficiently Active (12/24/2021)   Exercise Vital Sign    Days of Exercise per Week: 4 days    Minutes of Exercise per Session: 20 min  Stress: No Stress Concern Present (12/24/2021)   [place]    Feeling of Stress : Not at all  Social Connections: Moderately Integrated (12/24/2021)   Social Connection and Isolation Panel [NHANES]    Frequency of Communication with Friends and Family: Three times a week    Frequency of Social  Gatherings with Friends and Family: Once a week    Attends Religious Services: More than 4 times per year    Active Member of [employer] or Organizations: No    Attends Archivist Meetings: Never    Marital Status: Living with partner  Intimate Partner Violence: Not At Risk (12/24/2021)   Humiliation, Afraid, Rape, and Kick questionnaire    Fear of Current or Ex-Partner: No    Emotionally Abused: No    Physically Abused: No    Sexually Abused: No    Allergies  Allergen Reactions   Citalopram     Fatigue headache    Lisinopril Cough    Dry cough    Current Outpatient Medications  Medication Sig Dispense Refill   allopurinol (ZYLOPRIM) 100 MG tablet TAKE 1 TABLET BY MOUTH DAILY 90 tablet 3   AMBULATORY NON FORMULARY MEDICATION Medication Name: Testosterone 4% Gel  Apply 5.10CH ( 3 clicks) topically daily as directed 30 g 3   amLODipine (NORVASC) 5 MG tablet TAKE 1 TABLET BY MOUTH DAILY 100 tablet 2   aspirin 81 MG chewable tablet Chew 81 mg by mouth daily.      atorvastatin (LIPITOR) 40 MG tablet TAKE 1 TABLET BY MOUTH DAILY 100 tablet 1   B Complex-C (SUPER B COMPLEX PO) Take 1 tablet by mouth daily.     buPROPion (WELLBUTRIN XL) 150 MG 24 hr tablet TAKE 1 TABLET BY MOUTH DAILY -  STOP CITALOPRAM 90 tablet 3   hydrochlorothiazide (HYDRODIURIL) 25 MG tablet TAKE 1 TABLET BY MOUTH DAILY 100 tablet 2   HYDROcodone-acetaminophen (NORCO/VICODIN) 5-325 MG tablet Take 1 tablet by mouth every 4 (four) hours as needed for moderate pain. 60 tablet 0   Multiple Vitamin (MULTIVITAMIN) tablet Take 1 tablet by mouth daily.     potassium chloride (KLOR-CON M) 10 MEQ tablet TAKE 1 TABLET BY MOUTH DAILY 100 tablet 2   sildenafil (REVATIO) 20 MG tablet 1-5 po prn 20 tablet 11   No current facility-administered medications for this visit.    REVIEW OF SYSTEMS:  '[X]'$  denotes positive finding, '[ ]'$  denotes negative finding Cardiac  Comments:  Chest pain or chest pressure:    Shortness of  breath upon exertion:    Short of breath when lying flat:    Irregular heart rhythm:        Vascular    Pain in calf, thigh, or hip brought on by ambulation:    Pain in feet at night that wakes you up from your sleep:     Blood clot in your veins:    Leg swelling:         Pulmonary    Oxygen at home:  Productive cough:     Wheezing:         Neurologic    Sudden weakness in arms or legs:     Sudden numbness in arms or legs:     Sudden onset of difficulty speaking or slurred speech:    Temporary loss of vision in one eye:     Problems with dizziness:         Gastrointestinal    Blood in stool:     Vomited blood:         Genitourinary    Burning when urinating:     Blood in urine:        Psychiatric    Major depression:         Hematologic    Bleeding problems:    Problems with blood clotting too easily:        Skin    Rashes or ulcers:        Constitutional    Fever or chills:      PHYSICAL EXAM: Vitals:   03/10/22 0935  BP: (!) 163/81  Pulse: 95  Temp: 98.4 F (36.9 C)  TempSrc: Temporal  SpO2: 97%  Weight: 185 lb (83.9 kg)  Height: '5\' 7"'$  (1.702 m)    GENERAL: The patient is a well-nourished male, in no acute distress. The vital signs are documented above. CARDIOVASCULAR: 2+ radial and 2+ femoral pulses.  Absent popliteal and distal pulses bilaterally. PULMONARY: There is good air exchange  MUSCULOSKELETAL: There are no major deformities or cyanosis. NEUROLOGIC: No focal weakness or paresthesias are detected. SKIN: There are no ulcers or rashes noted.  Moderate bilateral lower extremity edema and dependent rubor PSYCHIATRIC: The patient has a normal affect.  DATA:  Noninvasive lower extremity studies from 01/25/2022 were reviewed with the patient.  This does show arm index of 0.67 on the right and 0.59 on the left  MEDICAL ISSUES: I discussed this with the patient.  In all likelihood he does have superficial femoral occlusive disease causing  moderate lower extremity arterial insufficiency.  I feel that his dependent rubor is more of a venous congestion than arterial insufficiency.  He does not walk to the level of claudication due to his orthopedic issues.  Since he is asymptomatic and does not have any evidence of critical limb ischemia I would not recommend any further workup or treatment at this time.  I did explain the need to continue to keep a close eye on his feet and to seek attention should he develop any nonhealing wounds.  Otherwise he will see [place] again on an as-needed basis   Rosetta Posner, MD [facility] V[facility] and Vein Specialists of [facility] Tel [phone] Pager [phone]  Note: Portions of this report may have been transcribed using voice recognition software.  Every effort has been made to ensure accuracy; however, inadvertent computerized transcription errors may still be present.

## 2022-03-17 ENCOUNTER — Telehealth: Payer: Self-pay | Admitting: Orthopedic Surgery

## 2022-03-17 NOTE — Telephone Encounter (Signed)
I spoke to [place] today.  I called the doctor in [place] and I also spoke to Dr. Ninfa Linden in [facility]  Dr. Adrian Blackwater says he is willing to do the left hip surgery.  Dr. Ninfa Linden graciously message me back and felt that surgery is probably best done in [place] and he was of the opinion that the right shot should be done first

## 2022-03-18 MED ORDER — HYDROCODONE-ACETAMINOPHEN 5-325 MG PO TABS: 1.0000 | ORAL_TABLET | ORAL | 0 refills | Status: DC | PRN

## 2022-03-18 NOTE — Telephone Encounter (Signed)
Patient called and is requesting a refill on Hydrocodone 5-325, 60 quantity, 1 every 4 hours prn to be sent to [facility].

## 2022-03-31 DIAGNOSIS — Z87891 Personal history of nicotine dependence: Secondary | ICD-10-CM | POA: Diagnosis not present

## 2022-03-31 DIAGNOSIS — Z79899 Other long term (current) drug therapy: Secondary | ICD-10-CM | POA: Diagnosis not present

## 2022-03-31 DIAGNOSIS — Z7982 Long term (current) use of aspirin: Secondary | ICD-10-CM | POA: Diagnosis not present

## 2022-03-31 DIAGNOSIS — M1612 Unilateral primary osteoarthritis, left hip: Secondary | ICD-10-CM | POA: Diagnosis not present

## 2022-04-01 ENCOUNTER — Other Ambulatory Visit: Payer: Self-pay | Admitting: Orthopedic Surgery

## 2022-04-01 DIAGNOSIS — M25551 Pain in right hip: Secondary | ICD-10-CM

## 2022-04-01 DIAGNOSIS — S73004A Unspecified dislocation of right hip, initial encounter: Secondary | ICD-10-CM

## 2022-04-01 DIAGNOSIS — M1612 Unilateral primary osteoarthritis, left hip: Secondary | ICD-10-CM | POA: Diagnosis not present

## 2022-04-01 MED ORDER — HYDROCODONE-ACETAMINOPHEN 5-325 MG PO TABS: 1.0000 | ORAL_TABLET | ORAL | 0 refills | Status: DC | PRN

## 2022-04-01 NOTE — Telephone Encounter (Signed)
Patient called requesting a refill on his Hydrocodone 5-'325mg'$ , 60 quantity, 1 every 4 hours to be sent to [facility].  Pt's # [phone]

## 2022-04-05 ENCOUNTER — Telehealth: Payer: Self-pay | Admitting: Family Medicine

## 2022-04-05 ENCOUNTER — Other Ambulatory Visit: Payer: Self-pay | Admitting: *Deleted

## 2022-04-05 DIAGNOSIS — E041 Nontoxic single thyroid nodule: Secondary | ICD-10-CM

## 2022-04-05 DIAGNOSIS — R911 Solitary pulmonary nodule: Secondary | ICD-10-CM

## 2022-04-05 NOTE — Telephone Encounter (Signed)
Pt calling in to see if PCP needed to see him before hip surgery in Feb. Pt does have appt scheduled for 06/04/22. Should pt move appt up or keep March appt. Please advise. Thank you.

## 2022-04-07 NOTE — Telephone Encounter (Signed)
Pt contacted and verbalized understanding. Pt scheduled for appt on 04/12/22. Pt will reach out to cardiology

## 2022-04-07 NOTE — Telephone Encounter (Signed)
Nurses I do think it is a good idea to see Pilar Plate I could work him in before the end of the month on one of my open slots preferably in the morning hours Also to though I would also advise for him to touch base with cardiology to make sure that they feel everything is in line for his surgery as well If he needs our help connecting with cardiology please let me know

## 2022-04-12 ENCOUNTER — Ambulatory Visit (INDEPENDENT_AMBULATORY_CARE_PROVIDER_SITE_OTHER): Payer: Medicare Other | Admitting: Family Medicine

## 2022-04-12 ENCOUNTER — Encounter: Payer: Self-pay | Admitting: Family Medicine

## 2022-04-12 VITALS — BP 149/79 | HR 84 | Temp 98.1°F | Ht 67.0 in | Wt 194.0 lb

## 2022-04-12 DIAGNOSIS — I10 Essential (primary) hypertension: Secondary | ICD-10-CM

## 2022-04-12 DIAGNOSIS — R7303 Prediabetes: Secondary | ICD-10-CM | POA: Diagnosis not present

## 2022-04-12 NOTE — Progress Notes (Signed)
   Subjective:    Patient ID: [name] [name], male    DOB: [birth year], 72 y.o.   MRN: [id number]  HPI Routine check up , patient states has an upcoming surgery in February 2024 for hip reconstruction at [facility] Patient will be having hip reconstruction A1c shows prediabetes Overall patient doing well Denies any chest tightness pressure pain shortness of breath States his energy level overall reasonable Patient is taking his blood pressure medicine regular basis. Also taking his cholesterol medicine regular basis. Recent labs from [facility] was reviewed with the patient   Review of Systems     Objective:   Physical Exam  General-in no acute distress Eyes-no discharge Lungs-respiratory rate normal, CTA CV-no murmurs,RRR Extremities skin warm dry no edema Neuro grossly normal Behavior normal, alert  EKG no acute changes are noted     Assessment & Plan:  Hip reconstruction surgery-overall he should do well. He is not having any cardiac symptoms.  We will reach out to his cardiologist regarding if they need to see him before surgery.  His previous CT coronary flow overall looked good except for 1 area which was felt best managed by medicines  Prediabetes controlled with diet  Will reach out to his cardiologist Dr.Nishan-we have sent communication to him

## 2022-04-13 ENCOUNTER — Encounter: Payer: Self-pay | Admitting: Orthopedic Surgery

## 2022-04-15 ENCOUNTER — Other Ambulatory Visit: Payer: Self-pay | Admitting: Orthopedic Surgery

## 2022-04-15 DIAGNOSIS — S73004A Unspecified dislocation of right hip, initial encounter: Secondary | ICD-10-CM

## 2022-04-15 DIAGNOSIS — M25551 Pain in right hip: Secondary | ICD-10-CM

## 2022-04-15 MED ORDER — HYDROCODONE-ACETAMINOPHEN 5-325 MG PO TABS: 1.0000 | ORAL_TABLET | ORAL | 0 refills | Status: DC | PRN

## 2022-04-15 NOTE — Telephone Encounter (Signed)
Patient lvm requesting a refill on Hydrocodone 5-325, 60 quantity, 1 every 4 hours to be sent to [facility].  Pt's # [phone]

## 2022-04-23 ENCOUNTER — Ambulatory Visit (HOSPITAL_COMMUNITY)
Admission: RE | Admit: 2022-04-23 | Discharge: 2022-04-23 | Disposition: A | Discharge status: Home / Self Care | Payer: Medicare Other | Source: Ambulatory Visit | Attending: Family Medicine | Admitting: Family Medicine

## 2022-04-23 DIAGNOSIS — R911 Solitary pulmonary nodule: Secondary | ICD-10-CM | POA: Insufficient documentation

## 2022-04-23 DIAGNOSIS — R918 Other nonspecific abnormal finding of lung field: Secondary | ICD-10-CM | POA: Diagnosis not present

## 2022-04-29 ENCOUNTER — Other Ambulatory Visit: Payer: Self-pay | Admitting: Orthopedic Surgery

## 2022-04-29 DIAGNOSIS — S73004A Unspecified dislocation of right hip, initial encounter: Secondary | ICD-10-CM

## 2022-04-29 DIAGNOSIS — M25551 Pain in right hip: Secondary | ICD-10-CM

## 2022-04-29 MED ORDER — HYDROCODONE-ACETAMINOPHEN 5-325 MG PO TABS: 1.0000 | ORAL_TABLET | ORAL | 0 refills | Status: DC | PRN

## 2022-04-29 NOTE — Telephone Encounter (Signed)
Patient lvm requesting a refill on his Hydrocodone 5-325, quantity 60, every 4hrs to be sent to [facility].

## 2022-04-29 NOTE — Addendum Note (Signed)
Addended by: Dairl Ponder on: 04/29/2022 11:18 AM   Modules accepted: Orders

## 2022-05-04 ENCOUNTER — Ambulatory Visit (HOSPITAL_COMMUNITY)
Admission: RE | Admit: 2022-05-04 | Discharge: 2022-05-04 | Disposition: A | Discharge status: Home / Self Care | Payer: Medicare Other | Source: Ambulatory Visit | Attending: Family Medicine | Admitting: Family Medicine

## 2022-05-04 DIAGNOSIS — E041 Nontoxic single thyroid nodule: Secondary | ICD-10-CM | POA: Insufficient documentation

## 2022-05-06 DIAGNOSIS — M109 Gout, unspecified: Secondary | ICD-10-CM | POA: Diagnosis not present

## 2022-05-06 DIAGNOSIS — I10 Essential (primary) hypertension: Secondary | ICD-10-CM | POA: Diagnosis not present

## 2022-05-06 DIAGNOSIS — Z87891 Personal history of nicotine dependence: Secondary | ICD-10-CM | POA: Diagnosis not present

## 2022-05-06 DIAGNOSIS — G8918 Other acute postprocedural pain: Secondary | ICD-10-CM | POA: Diagnosis not present

## 2022-05-06 DIAGNOSIS — I739 Peripheral vascular disease, unspecified: Secondary | ICD-10-CM | POA: Diagnosis not present

## 2022-05-06 DIAGNOSIS — E785 Hyperlipidemia, unspecified: Secondary | ICD-10-CM | POA: Diagnosis not present

## 2022-05-06 DIAGNOSIS — M1612 Unilateral primary osteoarthritis, left hip: Secondary | ICD-10-CM | POA: Diagnosis not present

## 2022-05-06 DIAGNOSIS — F32A Depression, unspecified: Secondary | ICD-10-CM | POA: Diagnosis not present

## 2022-05-06 DIAGNOSIS — R7303 Prediabetes: Secondary | ICD-10-CM | POA: Diagnosis not present

## 2022-05-06 DIAGNOSIS — G4733 Obstructive sleep apnea (adult) (pediatric): Secondary | ICD-10-CM | POA: Diagnosis not present

## 2022-05-06 DIAGNOSIS — T84020D Dislocation of internal right hip prosthesis, subsequent encounter: Secondary | ICD-10-CM | POA: Diagnosis not present

## 2022-05-06 HISTORY — PX: TOTAL HIP ARTHROPLASTY: SHX124

## 2022-05-07 DIAGNOSIS — F32A Depression, unspecified: Secondary | ICD-10-CM | POA: Diagnosis not present

## 2022-05-07 DIAGNOSIS — I10 Essential (primary) hypertension: Secondary | ICD-10-CM | POA: Diagnosis not present

## 2022-05-07 DIAGNOSIS — I739 Peripheral vascular disease, unspecified: Secondary | ICD-10-CM | POA: Diagnosis not present

## 2022-05-07 DIAGNOSIS — M109 Gout, unspecified: Secondary | ICD-10-CM | POA: Diagnosis not present

## 2022-05-07 DIAGNOSIS — M1612 Unilateral primary osteoarthritis, left hip: Secondary | ICD-10-CM | POA: Diagnosis not present

## 2022-05-07 DIAGNOSIS — E785 Hyperlipidemia, unspecified: Secondary | ICD-10-CM | POA: Diagnosis not present

## 2022-05-07 DIAGNOSIS — G4733 Obstructive sleep apnea (adult) (pediatric): Secondary | ICD-10-CM | POA: Diagnosis not present

## 2022-05-07 DIAGNOSIS — R7303 Prediabetes: Secondary | ICD-10-CM | POA: Diagnosis not present

## 2022-05-07 DIAGNOSIS — Z87891 Personal history of nicotine dependence: Secondary | ICD-10-CM | POA: Diagnosis not present

## 2022-05-10 ENCOUNTER — Ambulatory Visit: Payer: Medicare Other | Admitting: Orthopedic Surgery

## 2022-05-10 ENCOUNTER — Other Ambulatory Visit: Payer: Self-pay | Admitting: Orthopedic Surgery

## 2022-05-10 ENCOUNTER — Other Ambulatory Visit: Payer: Self-pay | Admitting: Family Medicine

## 2022-05-11 ENCOUNTER — Other Ambulatory Visit: Payer: Self-pay | Admitting: Family Medicine

## 2022-05-11 NOTE — Addendum Note (Signed)
Addended by: Dairl Ponder on: 05/11/2022 01:33 PM   Modules accepted: Orders

## 2022-05-12 ENCOUNTER — Ambulatory Visit: Payer: Medicare HMO | Admitting: Orthopedic Surgery

## 2022-05-14 DIAGNOSIS — I739 Peripheral vascular disease, unspecified: Secondary | ICD-10-CM | POA: Diagnosis not present

## 2022-05-14 DIAGNOSIS — Z7982 Long term (current) use of aspirin: Secondary | ICD-10-CM | POA: Diagnosis not present

## 2022-05-14 DIAGNOSIS — Z96642 Presence of left artificial hip joint: Secondary | ICD-10-CM | POA: Diagnosis not present

## 2022-05-14 DIAGNOSIS — Z87891 Personal history of nicotine dependence: Secondary | ICD-10-CM | POA: Diagnosis not present

## 2022-05-14 DIAGNOSIS — Z9181 History of falling: Secondary | ICD-10-CM | POA: Diagnosis not present

## 2022-05-14 DIAGNOSIS — Z96651 Presence of right artificial knee joint: Secondary | ICD-10-CM | POA: Diagnosis not present

## 2022-05-14 DIAGNOSIS — E785 Hyperlipidemia, unspecified: Secondary | ICD-10-CM | POA: Diagnosis not present

## 2022-05-14 DIAGNOSIS — Z79891 Long term (current) use of opiate analgesic: Secondary | ICD-10-CM | POA: Diagnosis not present

## 2022-05-14 DIAGNOSIS — G8929 Other chronic pain: Secondary | ICD-10-CM | POA: Diagnosis not present

## 2022-05-14 DIAGNOSIS — G4733 Obstructive sleep apnea (adult) (pediatric): Secondary | ICD-10-CM | POA: Diagnosis not present

## 2022-05-14 DIAGNOSIS — I10 Essential (primary) hypertension: Secondary | ICD-10-CM | POA: Diagnosis not present

## 2022-05-14 DIAGNOSIS — F32A Depression, unspecified: Secondary | ICD-10-CM | POA: Diagnosis not present

## 2022-05-14 DIAGNOSIS — Z556 Problems related to health literacy: Secondary | ICD-10-CM | POA: Diagnosis not present

## 2022-05-14 DIAGNOSIS — M109 Gout, unspecified: Secondary | ICD-10-CM | POA: Diagnosis not present

## 2022-05-14 DIAGNOSIS — Z471 Aftercare following joint replacement surgery: Secondary | ICD-10-CM | POA: Diagnosis not present

## 2022-05-19 ENCOUNTER — Telehealth: Payer: Self-pay | Admitting: Family Medicine

## 2022-05-19 DIAGNOSIS — Z4800 Encounter for change or removal of nonsurgical wound dressing: Secondary | ICD-10-CM | POA: Diagnosis not present

## 2022-05-19 DIAGNOSIS — E041 Nontoxic single thyroid nodule: Secondary | ICD-10-CM

## 2022-05-19 NOTE — Telephone Encounter (Signed)
Nurses This message below is what [facility] sent regarding biopsy Please make sure that we order the proper order to do fine-needle aspiration ultrasound biopsy.  Please also coordinate with [facility].  If any questions please ask.  Thanks-Dr. [place]ina Gravel, Leota Sauers, Elayne Snare, MD Cc: Orvan Seen, CMA; Vicente Males, LPN; Dairl Ponder, RN; Anastasio Auerbach R, LPN Good Morning :) Following back up on this Patient, at this time I do not see an order for a [place] BX.

## 2022-05-20 ENCOUNTER — Other Ambulatory Visit: Payer: Medicare Other

## 2022-05-20 ENCOUNTER — Encounter: Payer: Self-pay | Admitting: Radiology

## 2022-05-20 DIAGNOSIS — E291 Testicular hypofunction: Secondary | ICD-10-CM

## 2022-05-20 DIAGNOSIS — Z556 Problems related to health literacy: Secondary | ICD-10-CM | POA: Diagnosis not present

## 2022-05-20 DIAGNOSIS — E785 Hyperlipidemia, unspecified: Secondary | ICD-10-CM | POA: Diagnosis not present

## 2022-05-20 DIAGNOSIS — Z87891 Personal history of nicotine dependence: Secondary | ICD-10-CM | POA: Diagnosis not present

## 2022-05-20 DIAGNOSIS — Z79891 Long term (current) use of opiate analgesic: Secondary | ICD-10-CM | POA: Diagnosis not present

## 2022-05-20 DIAGNOSIS — F32A Depression, unspecified: Secondary | ICD-10-CM | POA: Diagnosis not present

## 2022-05-20 DIAGNOSIS — Z7982 Long term (current) use of aspirin: Secondary | ICD-10-CM | POA: Diagnosis not present

## 2022-05-20 DIAGNOSIS — I739 Peripheral vascular disease, unspecified: Secondary | ICD-10-CM | POA: Diagnosis not present

## 2022-05-20 DIAGNOSIS — Z471 Aftercare following joint replacement surgery: Secondary | ICD-10-CM | POA: Diagnosis not present

## 2022-05-20 DIAGNOSIS — Z9181 History of falling: Secondary | ICD-10-CM | POA: Diagnosis not present

## 2022-05-20 DIAGNOSIS — G8929 Other chronic pain: Secondary | ICD-10-CM | POA: Diagnosis not present

## 2022-05-20 DIAGNOSIS — G4733 Obstructive sleep apnea (adult) (pediatric): Secondary | ICD-10-CM | POA: Diagnosis not present

## 2022-05-20 DIAGNOSIS — M109 Gout, unspecified: Secondary | ICD-10-CM | POA: Diagnosis not present

## 2022-05-20 DIAGNOSIS — Z8546 Personal history of malignant neoplasm of prostate: Secondary | ICD-10-CM

## 2022-05-20 DIAGNOSIS — Z96651 Presence of right artificial knee joint: Secondary | ICD-10-CM | POA: Diagnosis not present

## 2022-05-20 DIAGNOSIS — I10 Essential (primary) hypertension: Secondary | ICD-10-CM | POA: Diagnosis not present

## 2022-05-20 DIAGNOSIS — Z96642 Presence of left artificial hip joint: Secondary | ICD-10-CM | POA: Diagnosis not present

## 2022-05-20 NOTE — Telephone Encounter (Signed)
Fine-needle aspiration ultrasound biopsy of thyroid nodule ordered in EPIC

## 2022-05-21 ENCOUNTER — Encounter: Payer: Self-pay | Admitting: Family Medicine

## 2022-05-21 ENCOUNTER — Encounter: Payer: Self-pay | Admitting: Orthopedic Surgery

## 2022-05-21 DIAGNOSIS — Z471 Aftercare following joint replacement surgery: Secondary | ICD-10-CM | POA: Diagnosis not present

## 2022-05-21 DIAGNOSIS — F32A Depression, unspecified: Secondary | ICD-10-CM | POA: Diagnosis not present

## 2022-05-21 DIAGNOSIS — E785 Hyperlipidemia, unspecified: Secondary | ICD-10-CM | POA: Diagnosis not present

## 2022-05-21 DIAGNOSIS — Z9181 History of falling: Secondary | ICD-10-CM | POA: Diagnosis not present

## 2022-05-21 DIAGNOSIS — G8929 Other chronic pain: Secondary | ICD-10-CM | POA: Diagnosis not present

## 2022-05-21 DIAGNOSIS — I10 Essential (primary) hypertension: Secondary | ICD-10-CM | POA: Diagnosis not present

## 2022-05-21 DIAGNOSIS — Z79891 Long term (current) use of opiate analgesic: Secondary | ICD-10-CM | POA: Diagnosis not present

## 2022-05-21 DIAGNOSIS — Z556 Problems related to health literacy: Secondary | ICD-10-CM | POA: Diagnosis not present

## 2022-05-21 DIAGNOSIS — Z96651 Presence of right artificial knee joint: Secondary | ICD-10-CM | POA: Diagnosis not present

## 2022-05-21 DIAGNOSIS — Z7982 Long term (current) use of aspirin: Secondary | ICD-10-CM | POA: Diagnosis not present

## 2022-05-21 DIAGNOSIS — Z87891 Personal history of nicotine dependence: Secondary | ICD-10-CM | POA: Diagnosis not present

## 2022-05-21 DIAGNOSIS — I739 Peripheral vascular disease, unspecified: Secondary | ICD-10-CM | POA: Diagnosis not present

## 2022-05-21 DIAGNOSIS — Z96642 Presence of left artificial hip joint: Secondary | ICD-10-CM | POA: Diagnosis not present

## 2022-05-21 DIAGNOSIS — M109 Gout, unspecified: Secondary | ICD-10-CM | POA: Diagnosis not present

## 2022-05-21 DIAGNOSIS — G4733 Obstructive sleep apnea (adult) (pediatric): Secondary | ICD-10-CM | POA: Diagnosis not present

## 2022-05-21 LAB — TESTOSTERONE: Testosterone: 261 ng/dL — ABNORMAL LOW (ref 264–916)

## 2022-05-21 LAB — HEMOGLOBIN AND HEMATOCRIT, BLOOD
Hematocrit: 34.8 % — ABNORMAL LOW (ref 37.5–51.0)
Hemoglobin: 11.2 g/dL — ABNORMAL LOW (ref 13.0–17.7)

## 2022-05-21 LAB — PSA: Prostate Specific Ag, Serum: <0.1 ng/mL (ref 0.0–4.0)

## 2022-05-24 DIAGNOSIS — Z79891 Long term (current) use of opiate analgesic: Secondary | ICD-10-CM | POA: Diagnosis not present

## 2022-05-24 DIAGNOSIS — G8929 Other chronic pain: Secondary | ICD-10-CM | POA: Diagnosis not present

## 2022-05-24 DIAGNOSIS — Z96642 Presence of left artificial hip joint: Secondary | ICD-10-CM | POA: Diagnosis not present

## 2022-05-24 DIAGNOSIS — Z471 Aftercare following joint replacement surgery: Secondary | ICD-10-CM | POA: Diagnosis not present

## 2022-05-24 DIAGNOSIS — I739 Peripheral vascular disease, unspecified: Secondary | ICD-10-CM | POA: Diagnosis not present

## 2022-05-24 DIAGNOSIS — M109 Gout, unspecified: Secondary | ICD-10-CM | POA: Diagnosis not present

## 2022-05-24 DIAGNOSIS — Z87891 Personal history of nicotine dependence: Secondary | ICD-10-CM | POA: Diagnosis not present

## 2022-05-24 DIAGNOSIS — Z96651 Presence of right artificial knee joint: Secondary | ICD-10-CM | POA: Diagnosis not present

## 2022-05-24 DIAGNOSIS — I10 Essential (primary) hypertension: Secondary | ICD-10-CM | POA: Diagnosis not present

## 2022-05-24 DIAGNOSIS — G4733 Obstructive sleep apnea (adult) (pediatric): Secondary | ICD-10-CM | POA: Diagnosis not present

## 2022-05-24 DIAGNOSIS — E785 Hyperlipidemia, unspecified: Secondary | ICD-10-CM | POA: Diagnosis not present

## 2022-05-24 DIAGNOSIS — F32A Depression, unspecified: Secondary | ICD-10-CM | POA: Diagnosis not present

## 2022-05-24 DIAGNOSIS — Z9181 History of falling: Secondary | ICD-10-CM | POA: Diagnosis not present

## 2022-05-24 DIAGNOSIS — Z7982 Long term (current) use of aspirin: Secondary | ICD-10-CM | POA: Diagnosis not present

## 2022-05-24 DIAGNOSIS — Z556 Problems related to health literacy: Secondary | ICD-10-CM | POA: Diagnosis not present

## 2022-05-27 ENCOUNTER — Ambulatory Visit (INDEPENDENT_AMBULATORY_CARE_PROVIDER_SITE_OTHER): Payer: Medicare Other | Admitting: Urology

## 2022-05-27 ENCOUNTER — Encounter: Payer: Self-pay | Admitting: Urology

## 2022-05-27 VITALS — BP 159/76 | HR 102 | Ht 67.0 in | Wt 194.0 lb

## 2022-05-27 DIAGNOSIS — Z556 Problems related to health literacy: Secondary | ICD-10-CM | POA: Diagnosis not present

## 2022-05-27 DIAGNOSIS — N5201 Erectile dysfunction due to arterial insufficiency: Secondary | ICD-10-CM | POA: Diagnosis not present

## 2022-05-27 DIAGNOSIS — Z7982 Long term (current) use of aspirin: Secondary | ICD-10-CM | POA: Diagnosis not present

## 2022-05-27 DIAGNOSIS — E291 Testicular hypofunction: Secondary | ICD-10-CM

## 2022-05-27 DIAGNOSIS — Z8546 Personal history of malignant neoplasm of prostate: Secondary | ICD-10-CM | POA: Diagnosis not present

## 2022-05-27 DIAGNOSIS — I739 Peripheral vascular disease, unspecified: Secondary | ICD-10-CM | POA: Diagnosis not present

## 2022-05-27 DIAGNOSIS — G8929 Other chronic pain: Secondary | ICD-10-CM | POA: Diagnosis not present

## 2022-05-27 DIAGNOSIS — E785 Hyperlipidemia, unspecified: Secondary | ICD-10-CM | POA: Diagnosis not present

## 2022-05-27 DIAGNOSIS — Z96651 Presence of right artificial knee joint: Secondary | ICD-10-CM | POA: Diagnosis not present

## 2022-05-27 DIAGNOSIS — N3941 Urge incontinence: Secondary | ICD-10-CM | POA: Diagnosis not present

## 2022-05-27 DIAGNOSIS — Z96642 Presence of left artificial hip joint: Secondary | ICD-10-CM | POA: Diagnosis not present

## 2022-05-27 DIAGNOSIS — I10 Essential (primary) hypertension: Secondary | ICD-10-CM | POA: Diagnosis not present

## 2022-05-27 DIAGNOSIS — M109 Gout, unspecified: Secondary | ICD-10-CM | POA: Diagnosis not present

## 2022-05-27 DIAGNOSIS — G4733 Obstructive sleep apnea (adult) (pediatric): Secondary | ICD-10-CM | POA: Diagnosis not present

## 2022-05-27 DIAGNOSIS — F32A Depression, unspecified: Secondary | ICD-10-CM | POA: Diagnosis not present

## 2022-05-27 DIAGNOSIS — Z87891 Personal history of nicotine dependence: Secondary | ICD-10-CM | POA: Diagnosis not present

## 2022-05-27 DIAGNOSIS — Z9181 History of falling: Secondary | ICD-10-CM | POA: Diagnosis not present

## 2022-05-27 DIAGNOSIS — Z79891 Long term (current) use of opiate analgesic: Secondary | ICD-10-CM | POA: Diagnosis not present

## 2022-05-27 DIAGNOSIS — Z471 Aftercare following joint replacement surgery: Secondary | ICD-10-CM | POA: Diagnosis not present

## 2022-05-27 DIAGNOSIS — R351 Nocturia: Secondary | ICD-10-CM

## 2022-05-27 LAB — URINALYSIS, ROUTINE W REFLEX MICROSCOPIC
Bilirubin, UA: NEGATIVE
Glucose, UA: NEGATIVE
Ketones, UA: NEGATIVE
Leukocytes,UA: NEGATIVE
Nitrite, UA: NEGATIVE
Protein,UA: NEGATIVE
RBC, UA: NEGATIVE
Specific Gravity, UA: 1.01 (ref 1.005–1.030)
Urobilinogen, Ur: 0.2 mg/dL (ref 0.2–1.0)
pH, UA: 7 (ref 5.0–7.5)

## 2022-05-27 MED ORDER — AMBULATORY NON FORMULARY MEDICATION: 5 refills | Status: DC

## 2022-05-27 MED ORDER — SILDENAFIL CITRATE 20 MG PO TABS: ORAL_TABLET | ORAL | 11 refills | Status: DC

## 2022-05-27 NOTE — Progress Notes (Signed)
Subjective:  1. Hypogonadism male   2. History of prostate cancer   3. Erectile dysfunction due to arterial insufficiency   4. Urge incontinence       [name] [name] returns today in f/u for his history of hypogonadism and prostate cancer. He had a left THA on 05/06/22 at [facility] and is still rehabing.  He is has to have a revision on the right when he is recovered from the left.  He is s/p a RALP on 04/20/11. His PSA was <0.1 on 05/20/22. He had Gleason 7(3+4) T2c N0 disease.   He has MUI and had some increased frequency on HCTZ but is still having the frequency and nocturia after stopping it.   He rarely wear pads.  He has no SUI but can have UUI but that is rare if he is careful and has noted that once he gets the urge he can only hold it q15-20 min.  He has no hematuria or dysuria. He has a good stream. His IPSS is 11.  UA is clear today.  He had a negative hematuria w/u in 9/23.   He has had some erectile dysfunction that he manages with sildenafil '20mg'$  but he hasn't been active with his orthopedic issues but he wants a refill.  He remains on compounded testosterone 4% 3 clicks daily but he is only using it intermittently with none in the last 2 weeks.  He has had a lot of other expenses.  His T level is 261 and his last Hgb is 11.2 which is down from 15.2 last year but consistent with a postop anemia.   He is doing well with good energy level.     IPSS     Row Name 05/27/22 1000         International Prostate Symptom Score   How often have you had the sensation of not emptying your bladder? Less than 1 in 5     How often have you had to urinate less than every two hours? Almost always     How often have you found you stopped and started again several times when you urinated? Not at All     How often have you found it difficult to postpone urination? More than half the time     How often have you had a weak urinary stream? Not at All     How often have you had to strain to start urination?  Not at All     How many times did you typically get up at night to urinate? 4 Times     Total IPSS Score 14       Quality of Life due to urinary symptoms   If you were to spend the rest of your life with your urinary condition just the way it is now how would you feel about that? Unhappy                        ROS:  ROS:  A complete review of systems was performed.  All systems are negative except for pertinent findings as noted.   Review of Systems  Endo/Heme/Allergies:  Bruises/bleeds easily.  Psychiatric/Behavioral:  Positive for depression. The patient is nervous/anxious.   All other systems reviewed and are negative.   Allergies  Allergen Reactions   Citalopram     Fatigue headache    Lisinopril Cough    Dry cough    Outpatient Encounter Medications as of 05/27/2022  Medication Sig   allopurinol (ZYLOPRIM) 100 MG tablet TAKE 1 TABLET BY MOUTH DAILY   amLODipine (NORVASC) 5 MG tablet TAKE 1 TABLET BY MOUTH DAILY   aspirin 81 MG chewable tablet Chew 81 mg by mouth daily.    atorvastatin (LIPITOR) 40 MG tablet TAKE 1 TABLET BY MOUTH DAILY   B Complex-C (SUPER B COMPLEX PO) Take 1 tablet by mouth daily.   buPROPion (WELLBUTRIN XL) 150 MG 24 hr tablet TAKE 1 TABLET BY MOUTH DAILY -  STOP CITALOPRAM   hydrochlorothiazide (HYDRODIURIL) 25 MG tablet TAKE 1 TABLET BY MOUTH DAILY   HYDROcodone-acetaminophen (NORCO/VICODIN) 5-325 MG tablet Take 1 tablet by mouth every 4 (four) hours as needed for moderate pain.   Multiple Vitamin (MULTIVITAMIN) tablet Take 1 tablet by mouth daily.   [EXPIRED] oxyCODONE-acetaminophen (PERCOCET/ROXICET) 5-325 MG tablet Take 1 tablet by mouth.   potassium chloride (KLOR-CON M) 10 MEQ tablet TAKE 1 TABLET BY MOUTH DAILY   [DISCONTINUED] AMBULATORY NON FORMULARY MEDICATION Medication Name: Testosterone 4% Gel  Apply 123XX123 ( 3 clicks) topically daily as directed   [DISCONTINUED] sildenafil (REVATIO) 20 MG tablet 1-5 po prn   AMBULATORY  NON FORMULARY MEDICATION Medication Name: Testosterone 4% Gel  Apply 123XX123 ( 3 clicks) topically daily as directed   sildenafil (REVATIO) 20 MG tablet 1-5 po prn   [DISCONTINUED] meloxicam (MOBIC) 7.5 MG tablet TAKE 1 TABLET BY MOUTH DAILY (Patient not taking: Reported on 05/27/2022)   No facility-administered encounter medications on file as of 05/27/2022.    Past Medical History:  Diagnosis Date   Aortic atherosclerosis ([place]) 10/19/2016    Seen on chest x-ray July 2018   Arthritis    right knee and hip    Cancer [facility])    prostate cancer    Chronic kidney disease    prostate cancer    Diabetes mellitus without complication ([place])    GERD (gastroesophageal reflux disease)    Gout    Gout    Hypercholesteremia    Hyperglycemia    Hyperlipidemia    Hypertension    Sleep apnea    Status post THR (total hip replacement) 04/20/13 05/01/2013   Right total hip 04/20/2013 Dr. Lydia Guiles implant     Past Surgical History:  Procedure Laterality Date   COLONOSCOPY     COLONOSCOPY N/A 10/22/2013   Procedure: COLONOSCOPY;  Surgeon: Danie Binder, MD;  Location: [facility];  Service: Endoscopy;  Laterality: N/A;  1115   HERNIA REPAIR     right inguinal hernia repair    LUMBAR LAMINECTOMY/DECOMPRESSION MICRODISCECTOMY Right 03/16/2021   Procedure: Right Lumbar four-five Laminectomy/foraminotomy;  Surgeon: Eustace Moore, MD;  Location: [place];  Service: Neurosurgery;  Laterality: Right;   NASAL SEPTUM SURGERY     OTHER SURGICAL HISTORY     deviated septum surgery    ROBOT ASSISTED LAPAROSCOPIC RADICAL PROSTATECTOMY  04/20/2011   Procedure: ROBOTIC ASSISTED LAPAROSCOPIC RADICAL PROSTATECTOMY;  Surgeon: Malka So, MD;  Location: [facility];  Service: Urology;  Laterality: N/A;  Bilateral lymph node dissection   TOTAL HIP ARTHROPLASTY Right 04/20/2013   Procedure: TOTAL HIP ARTHROPLASTY;  Surgeon: Carole Civil, MD;  Location: [facility];  Service: Orthopedics;  Laterality: Right;   TOTAL  KNEE ARTHROPLASTY Right 05/16/2018   Procedure: TOTAL KNEE ARTHROPLASTY;  Surgeon: Carole Civil, MD;  Location: [facility];  Service: Orthopedics;  Laterality: Right;    Social History   Socioeconomic History   Marital status: Single  Spouse name: Not on file   Number of children: Not on file   Years of education: Not on file   Highest education level: Not on file  Occupational History   Not on file  Tobacco Use   Smoking status: Former    Packs/day: 0.50    Years: 8.00    Total pack years: 4.00    Types: Cigarettes    Quit date: 03/22/1984    Years since quitting: 38.2   Smokeless tobacco: Never  Vaping Use   Vaping Use: Never used  Substance and Sexual Activity   Alcohol use: Yes    Alcohol/week: 1.0 standard drink of alcohol    Types: 1 Cans of beer per week    Comment: occasional    Drug use: No   Sexual activity: Yes    Birth control/protection: None  Other Topics Concern   Not on file  Social History Narrative   Not on file   Social Determinants of Health   Financial Resource Strain: Low Risk  (12/24/2021)   Overall Financial Resource Strain (CARDIA)    Difficulty of Paying Living Expenses: Not hard at all  Food Insecurity: No Food Insecurity (12/24/2021)   Hunger Vital Sign    Worried About Running Out of Food in the Last Year: Never true    Ran Out of Food in the Last Year: Never true  Transportation Needs: No Transportation Needs (12/24/2021)   PRAPARE - Hydrologist (Medical): No    Lack of Transportation (Non-Medical): No  Physical Activity: Insufficiently Active (12/24/2021)   Exercise Vital Sign    Days of Exercise per Week: 4 days    Minutes of Exercise per Session: 20 min  Stress: No Stress Concern Present (12/24/2021)   [place]    Feeling of Stress : Not at all  Social Connections: Moderately Integrated (12/24/2021)   Social Connection and Isolation  Panel [NHANES]    Frequency of Communication with Friends and Family: Three times a week    Frequency of Social Gatherings with Friends and Family: Once a week    Attends Religious Services: More than 4 times per year    Active Member of [employer] or Organizations: No    Attends Archivist Meetings: Never    Marital Status: Living with partner  Intimate Partner Violence: Not At Risk (12/24/2021)   Humiliation, Afraid, Rape, and Kick questionnaire    Fear of Current or Ex-Partner: No    Emotionally Abused: No    Physically Abused: No    Sexually Abused: No    Family History  Problem Relation Age of Onset   Diabetes Mother    Hypertension Mother    Hypertension Brother        Objective: BP (!) 159/76   Pulse (!) 102   Ht '5\' 7"'$  (1.702 m)   Wt 194 lb (88 kg)   BMI 30.38 kg/m     Physical Exam Vitals reviewed.  Constitutional:      Appearance: Normal appearance.  Neurological:     Mental Status: He is alert.     Lab Results:  Results for orders placed or performed in visit on 05/27/22 (from the past 24 hour(s))  Urinalysis, Routine w reflex microscopic     Status: None   Collection Time: 05/27/22 10:31 AM  Result Value Ref Range   Specific Gravity, UA 1.010 1.005 - 1.030   pH, UA 7.0 5.0 -  7.5   Color, UA Yellow Yellow   Appearance Ur Clear Clear   Leukocytes,UA Negative Negative   Protein,UA Negative Negative/Trace   Glucose, UA Negative Negative   Ketones, UA Negative Negative   RBC, UA Negative Negative   Bilirubin, UA Negative Negative   Urobilinogen, Ur 0.2 0.2 - 1.0 mg/dL   Nitrite, UA Negative Negative   Microscopic Examination Comment    Narrative   Performed at:  [facility] [address], [place], [place]  [id number] Lab Director: [place], Phone:  [phone]     UA has 3-10 RBC's.     BMET No results for input(s): "NA", "K", "CL", "CO2", "GLUCOSE", "BUN", "CREATININE", "CALCIUM" in the last 72 hours. PSA PSA   Date Value Ref Range Status  05/09/2019 <0.1 < OR = 4.0 ng/mL Final    Comment:    The total PSA value from this assay system is  standardized against the WHO standard. The test  result will be approximately 20% lower when compared  to the equimolar-standardized total PSA (Beckman  Coulter). Comparison of serial PSA results should be  interpreted with this fact in mind. . This test was performed using the Siemens  chemiluminescent method. Values obtained from  different assay methods cannot be used interchangeably. PSA levels, regardless of value, should not be interpreted as absolute evidence of the presence or absence of disease.    Testosterone  Date Value Ref Range Status  05/20/2022 261 (L) 264 - 916 ng/dL Final    Comment:    Adult male reference interval is based on a population of healthy nonobese males (BMI <30) between 19 and 83 years old. [place], [place] [phone]. PMID: [id number].   10/26/2021 194 (L) 264 - 916 ng/dL Final    Comment:    Adult male reference interval is based on a population of healthy nonobese males (BMI <30) between 63 and 39 years old. [place], [place] [phone]. PMID: [id number].   04/29/2021 272 264 - 916 ng/dL Final    Comment:    Adult male reference interval is based on a population of healthy nonobese males (BMI <30) between 42 and 75 years old. [place], [place] [phone]. PMID: [id number].    Hgb is 14.4.  UA is clear.  Results for orders placed or performed in visit on 05/27/22 (from the past 24 hour(s))  Urinalysis, Routine w reflex microscopic     Status: None   Collection Time: 05/27/22 10:31 AM  Result Value Ref Range   Specific Gravity, UA 1.010 1.005 - 1.030   pH, UA 7.0 5.0 - 7.5   Color, UA Yellow Yellow   Appearance Ur Clear Clear   Leukocytes,UA Negative Negative   Protein,UA Negative Negative/Trace   Glucose, UA Negative Negative   Ketones, UA Negative Negative   RBC, UA  Negative Negative   Bilirubin, UA Negative Negative   Urobilinogen, Ur 0.2 0.2 - 1.0 mg/dL   Nitrite, UA Negative Negative   Microscopic Examination Comment    Narrative   Performed at:  [facility] [address] [place], [place]  [id number] Lab Director: [place], Phone:  [phone]        Studies/Results: No results found.    Assessment & Plan: Hypogonadism.  He is doing well on therapy.  Med refilled with hand written script.  I will get labs in 6 months prior to f/u.  ED.  Sildenafil remains effective.  Hx of prostate cancer.  PSA  remains undetectible.  Urgency with UUI and nocturia.   I will try him on Myrbetriq '25mg'$  and gave him samples.   Instructions and side effects reviewed.   His BP is generally lower at home.   He will call if he would like a script.     Meds ordered this encounter  Medications   AMBULATORY NON FORMULARY MEDICATION    Sig: Medication Name: Testosterone 4% Gel  Apply 123XX123 ( 3 clicks) topically daily as directed    Dispense:  30 g    Refill:  5   sildenafil (REVATIO) 20 MG tablet    Sig: 1-5 po prn    Dispense:  20 tablet    Refill:  11      Orders Placed This Encounter  Procedures   Urinalysis, Routine w reflex microscopic   Testosterone    Standing Status:   Future    Standing Expiration Date:   05/27/2023   Hemoglobin and hematocrit, blood    Standing Status:   Future    Standing Expiration Date:   05/27/2023      Return in about 6 months (around 11/27/2022) for with labs.   CC: Kathyrn Drown, MD      Irine Seal 05/28/2022 Patient ID: [name] [name], male   DOB: 1950/07/09, 72 y.o.   MRN: [id number]

## 2022-05-28 ENCOUNTER — Encounter: Payer: Self-pay | Admitting: Urology

## 2022-05-31 DIAGNOSIS — Z96642 Presence of left artificial hip joint: Secondary | ICD-10-CM | POA: Diagnosis not present

## 2022-05-31 DIAGNOSIS — Z87891 Personal history of nicotine dependence: Secondary | ICD-10-CM | POA: Diagnosis not present

## 2022-05-31 DIAGNOSIS — E785 Hyperlipidemia, unspecified: Secondary | ICD-10-CM | POA: Diagnosis not present

## 2022-05-31 DIAGNOSIS — I10 Essential (primary) hypertension: Secondary | ICD-10-CM | POA: Diagnosis not present

## 2022-05-31 DIAGNOSIS — G4733 Obstructive sleep apnea (adult) (pediatric): Secondary | ICD-10-CM | POA: Diagnosis not present

## 2022-05-31 DIAGNOSIS — Z556 Problems related to health literacy: Secondary | ICD-10-CM | POA: Diagnosis not present

## 2022-05-31 DIAGNOSIS — Z79891 Long term (current) use of opiate analgesic: Secondary | ICD-10-CM | POA: Diagnosis not present

## 2022-05-31 DIAGNOSIS — Z471 Aftercare following joint replacement surgery: Secondary | ICD-10-CM | POA: Diagnosis not present

## 2022-05-31 DIAGNOSIS — F32A Depression, unspecified: Secondary | ICD-10-CM | POA: Diagnosis not present

## 2022-05-31 DIAGNOSIS — M109 Gout, unspecified: Secondary | ICD-10-CM | POA: Diagnosis not present

## 2022-05-31 DIAGNOSIS — G8929 Other chronic pain: Secondary | ICD-10-CM | POA: Diagnosis not present

## 2022-05-31 DIAGNOSIS — Z7982 Long term (current) use of aspirin: Secondary | ICD-10-CM | POA: Diagnosis not present

## 2022-05-31 DIAGNOSIS — Z9181 History of falling: Secondary | ICD-10-CM | POA: Diagnosis not present

## 2022-05-31 DIAGNOSIS — I739 Peripheral vascular disease, unspecified: Secondary | ICD-10-CM | POA: Diagnosis not present

## 2022-05-31 DIAGNOSIS — Z96651 Presence of right artificial knee joint: Secondary | ICD-10-CM | POA: Diagnosis not present

## 2022-06-02 ENCOUNTER — Encounter (HOSPITAL_COMMUNITY): Payer: Self-pay

## 2022-06-02 ENCOUNTER — Ambulatory Visit (HOSPITAL_COMMUNITY)
Admission: RE | Admit: 2022-06-02 | Discharge: 2022-06-02 | Disposition: A | Discharge status: Home / Self Care | Payer: Medicare Other | Source: Ambulatory Visit | Attending: Family Medicine | Admitting: Family Medicine

## 2022-06-02 DIAGNOSIS — E041 Nontoxic single thyroid nodule: Secondary | ICD-10-CM | POA: Insufficient documentation

## 2022-06-02 MED ORDER — LIDOCAINE HCL (PF) 2 % IJ SOLN
INTRAMUSCULAR | Status: AC
  Administered 2022-06-02: 10 mL
  Filled 2022-06-02: qty 10

## 2022-06-02 NOTE — Progress Notes (Signed)
PT tolerated thyroid biopsy procedure well today. Labs and afirma obtained and taking to lab for processing by Kristen at [phone]. PT ambulatory with walker at discharge with no acute distress noted and verbalized understanding of discharge instructions.

## 2022-06-03 ENCOUNTER — Ambulatory Visit: Payer: Medicare Other | Admitting: Family Medicine

## 2022-06-03 DIAGNOSIS — Z9181 History of falling: Secondary | ICD-10-CM | POA: Diagnosis not present

## 2022-06-03 DIAGNOSIS — Z7982 Long term (current) use of aspirin: Secondary | ICD-10-CM | POA: Diagnosis not present

## 2022-06-03 DIAGNOSIS — Z87891 Personal history of nicotine dependence: Secondary | ICD-10-CM | POA: Diagnosis not present

## 2022-06-03 DIAGNOSIS — F32A Depression, unspecified: Secondary | ICD-10-CM | POA: Diagnosis not present

## 2022-06-03 DIAGNOSIS — Z96651 Presence of right artificial knee joint: Secondary | ICD-10-CM | POA: Diagnosis not present

## 2022-06-03 DIAGNOSIS — Z471 Aftercare following joint replacement surgery: Secondary | ICD-10-CM | POA: Diagnosis not present

## 2022-06-03 DIAGNOSIS — Z556 Problems related to health literacy: Secondary | ICD-10-CM | POA: Diagnosis not present

## 2022-06-03 DIAGNOSIS — I739 Peripheral vascular disease, unspecified: Secondary | ICD-10-CM | POA: Diagnosis not present

## 2022-06-03 DIAGNOSIS — G4733 Obstructive sleep apnea (adult) (pediatric): Secondary | ICD-10-CM | POA: Diagnosis not present

## 2022-06-03 DIAGNOSIS — Z96642 Presence of left artificial hip joint: Secondary | ICD-10-CM | POA: Diagnosis not present

## 2022-06-03 DIAGNOSIS — E785 Hyperlipidemia, unspecified: Secondary | ICD-10-CM | POA: Diagnosis not present

## 2022-06-03 DIAGNOSIS — G8929 Other chronic pain: Secondary | ICD-10-CM | POA: Diagnosis not present

## 2022-06-03 DIAGNOSIS — I10 Essential (primary) hypertension: Secondary | ICD-10-CM | POA: Diagnosis not present

## 2022-06-03 DIAGNOSIS — Z79891 Long term (current) use of opiate analgesic: Secondary | ICD-10-CM | POA: Diagnosis not present

## 2022-06-03 DIAGNOSIS — M109 Gout, unspecified: Secondary | ICD-10-CM | POA: Diagnosis not present

## 2022-06-04 LAB — CYTOLOGY - NON PAP

## 2022-06-07 DIAGNOSIS — G4733 Obstructive sleep apnea (adult) (pediatric): Secondary | ICD-10-CM | POA: Diagnosis not present

## 2022-06-07 DIAGNOSIS — Z556 Problems related to health literacy: Secondary | ICD-10-CM | POA: Diagnosis not present

## 2022-06-07 DIAGNOSIS — Z96651 Presence of right artificial knee joint: Secondary | ICD-10-CM | POA: Diagnosis not present

## 2022-06-07 DIAGNOSIS — M109 Gout, unspecified: Secondary | ICD-10-CM | POA: Diagnosis not present

## 2022-06-07 DIAGNOSIS — Z9181 History of falling: Secondary | ICD-10-CM | POA: Diagnosis not present

## 2022-06-07 DIAGNOSIS — I739 Peripheral vascular disease, unspecified: Secondary | ICD-10-CM | POA: Diagnosis not present

## 2022-06-07 DIAGNOSIS — E785 Hyperlipidemia, unspecified: Secondary | ICD-10-CM | POA: Diagnosis not present

## 2022-06-07 DIAGNOSIS — Z471 Aftercare following joint replacement surgery: Secondary | ICD-10-CM | POA: Diagnosis not present

## 2022-06-07 DIAGNOSIS — Z7982 Long term (current) use of aspirin: Secondary | ICD-10-CM | POA: Diagnosis not present

## 2022-06-07 DIAGNOSIS — Z96642 Presence of left artificial hip joint: Secondary | ICD-10-CM | POA: Diagnosis not present

## 2022-06-07 DIAGNOSIS — I10 Essential (primary) hypertension: Secondary | ICD-10-CM | POA: Diagnosis not present

## 2022-06-07 DIAGNOSIS — F32A Depression, unspecified: Secondary | ICD-10-CM | POA: Diagnosis not present

## 2022-06-07 DIAGNOSIS — G8929 Other chronic pain: Secondary | ICD-10-CM | POA: Diagnosis not present

## 2022-06-07 DIAGNOSIS — Z79891 Long term (current) use of opiate analgesic: Secondary | ICD-10-CM | POA: Diagnosis not present

## 2022-06-07 DIAGNOSIS — Z87891 Personal history of nicotine dependence: Secondary | ICD-10-CM | POA: Diagnosis not present

## 2022-06-10 DIAGNOSIS — F32A Depression, unspecified: Secondary | ICD-10-CM | POA: Diagnosis not present

## 2022-06-10 DIAGNOSIS — G4733 Obstructive sleep apnea (adult) (pediatric): Secondary | ICD-10-CM | POA: Diagnosis not present

## 2022-06-10 DIAGNOSIS — Z96642 Presence of left artificial hip joint: Secondary | ICD-10-CM | POA: Diagnosis not present

## 2022-06-10 DIAGNOSIS — I10 Essential (primary) hypertension: Secondary | ICD-10-CM | POA: Diagnosis not present

## 2022-06-10 DIAGNOSIS — Z79891 Long term (current) use of opiate analgesic: Secondary | ICD-10-CM | POA: Diagnosis not present

## 2022-06-10 DIAGNOSIS — Z556 Problems related to health literacy: Secondary | ICD-10-CM | POA: Diagnosis not present

## 2022-06-10 DIAGNOSIS — Z7982 Long term (current) use of aspirin: Secondary | ICD-10-CM | POA: Diagnosis not present

## 2022-06-10 DIAGNOSIS — Z87891 Personal history of nicotine dependence: Secondary | ICD-10-CM | POA: Diagnosis not present

## 2022-06-10 DIAGNOSIS — Z9181 History of falling: Secondary | ICD-10-CM | POA: Diagnosis not present

## 2022-06-10 DIAGNOSIS — G8929 Other chronic pain: Secondary | ICD-10-CM | POA: Diagnosis not present

## 2022-06-10 DIAGNOSIS — M109 Gout, unspecified: Secondary | ICD-10-CM | POA: Diagnosis not present

## 2022-06-10 DIAGNOSIS — Z96651 Presence of right artificial knee joint: Secondary | ICD-10-CM | POA: Diagnosis not present

## 2022-06-10 DIAGNOSIS — Z471 Aftercare following joint replacement surgery: Secondary | ICD-10-CM | POA: Diagnosis not present

## 2022-06-10 DIAGNOSIS — I739 Peripheral vascular disease, unspecified: Secondary | ICD-10-CM | POA: Diagnosis not present

## 2022-06-10 DIAGNOSIS — E785 Hyperlipidemia, unspecified: Secondary | ICD-10-CM | POA: Diagnosis not present

## 2022-06-14 DIAGNOSIS — Z556 Problems related to health literacy: Secondary | ICD-10-CM | POA: Diagnosis not present

## 2022-06-14 DIAGNOSIS — Z471 Aftercare following joint replacement surgery: Secondary | ICD-10-CM | POA: Diagnosis not present

## 2022-06-14 DIAGNOSIS — M109 Gout, unspecified: Secondary | ICD-10-CM | POA: Diagnosis not present

## 2022-06-14 DIAGNOSIS — F32A Depression, unspecified: Secondary | ICD-10-CM | POA: Diagnosis not present

## 2022-06-14 DIAGNOSIS — E785 Hyperlipidemia, unspecified: Secondary | ICD-10-CM | POA: Diagnosis not present

## 2022-06-14 DIAGNOSIS — Z96651 Presence of right artificial knee joint: Secondary | ICD-10-CM | POA: Diagnosis not present

## 2022-06-14 DIAGNOSIS — Z87891 Personal history of nicotine dependence: Secondary | ICD-10-CM | POA: Diagnosis not present

## 2022-06-14 DIAGNOSIS — Z96642 Presence of left artificial hip joint: Secondary | ICD-10-CM | POA: Diagnosis not present

## 2022-06-14 DIAGNOSIS — I10 Essential (primary) hypertension: Secondary | ICD-10-CM | POA: Diagnosis not present

## 2022-06-14 DIAGNOSIS — I739 Peripheral vascular disease, unspecified: Secondary | ICD-10-CM | POA: Diagnosis not present

## 2022-06-14 DIAGNOSIS — G8929 Other chronic pain: Secondary | ICD-10-CM | POA: Diagnosis not present

## 2022-06-14 DIAGNOSIS — G4733 Obstructive sleep apnea (adult) (pediatric): Secondary | ICD-10-CM | POA: Diagnosis not present

## 2022-06-14 DIAGNOSIS — Z7982 Long term (current) use of aspirin: Secondary | ICD-10-CM | POA: Diagnosis not present

## 2022-06-14 DIAGNOSIS — Z79891 Long term (current) use of opiate analgesic: Secondary | ICD-10-CM | POA: Diagnosis not present

## 2022-06-14 DIAGNOSIS — Z9181 History of falling: Secondary | ICD-10-CM | POA: Diagnosis not present

## 2022-06-15 ENCOUNTER — Other Ambulatory Visit: Payer: Self-pay | Admitting: *Deleted

## 2022-06-15 DIAGNOSIS — E041 Nontoxic single thyroid nodule: Secondary | ICD-10-CM

## 2022-06-16 ENCOUNTER — Other Ambulatory Visit: Payer: Self-pay | Admitting: Family Medicine

## 2022-06-18 DIAGNOSIS — Z471 Aftercare following joint replacement surgery: Secondary | ICD-10-CM | POA: Diagnosis not present

## 2022-06-18 DIAGNOSIS — Z7982 Long term (current) use of aspirin: Secondary | ICD-10-CM | POA: Diagnosis not present

## 2022-06-18 DIAGNOSIS — Z9181 History of falling: Secondary | ICD-10-CM | POA: Diagnosis not present

## 2022-06-18 DIAGNOSIS — Z96642 Presence of left artificial hip joint: Secondary | ICD-10-CM | POA: Diagnosis not present

## 2022-06-18 DIAGNOSIS — Z556 Problems related to health literacy: Secondary | ICD-10-CM | POA: Diagnosis not present

## 2022-06-18 DIAGNOSIS — I739 Peripheral vascular disease, unspecified: Secondary | ICD-10-CM | POA: Diagnosis not present

## 2022-06-18 DIAGNOSIS — I10 Essential (primary) hypertension: Secondary | ICD-10-CM | POA: Diagnosis not present

## 2022-06-18 DIAGNOSIS — G4733 Obstructive sleep apnea (adult) (pediatric): Secondary | ICD-10-CM | POA: Diagnosis not present

## 2022-06-18 DIAGNOSIS — E785 Hyperlipidemia, unspecified: Secondary | ICD-10-CM | POA: Diagnosis not present

## 2022-06-18 DIAGNOSIS — M109 Gout, unspecified: Secondary | ICD-10-CM | POA: Diagnosis not present

## 2022-06-18 DIAGNOSIS — G8929 Other chronic pain: Secondary | ICD-10-CM | POA: Diagnosis not present

## 2022-06-18 DIAGNOSIS — Z96651 Presence of right artificial knee joint: Secondary | ICD-10-CM | POA: Diagnosis not present

## 2022-06-18 DIAGNOSIS — Z79891 Long term (current) use of opiate analgesic: Secondary | ICD-10-CM | POA: Diagnosis not present

## 2022-06-18 DIAGNOSIS — F32A Depression, unspecified: Secondary | ICD-10-CM | POA: Diagnosis not present

## 2022-06-18 DIAGNOSIS — Z87891 Personal history of nicotine dependence: Secondary | ICD-10-CM | POA: Diagnosis not present

## 2022-06-23 DIAGNOSIS — Z79891 Long term (current) use of opiate analgesic: Secondary | ICD-10-CM | POA: Diagnosis not present

## 2022-06-23 DIAGNOSIS — Z96641 Presence of right artificial hip joint: Secondary | ICD-10-CM | POA: Diagnosis not present

## 2022-06-23 DIAGNOSIS — T84020A Dislocation of internal right hip prosthesis, initial encounter: Secondary | ICD-10-CM | POA: Diagnosis not present

## 2022-06-23 DIAGNOSIS — Z9181 History of falling: Secondary | ICD-10-CM | POA: Diagnosis not present

## 2022-06-23 DIAGNOSIS — Z96642 Presence of left artificial hip joint: Secondary | ICD-10-CM | POA: Diagnosis not present

## 2022-06-23 DIAGNOSIS — G8929 Other chronic pain: Secondary | ICD-10-CM | POA: Diagnosis not present

## 2022-06-23 DIAGNOSIS — Z7982 Long term (current) use of aspirin: Secondary | ICD-10-CM | POA: Diagnosis not present

## 2022-06-23 DIAGNOSIS — Z87891 Personal history of nicotine dependence: Secondary | ICD-10-CM | POA: Diagnosis not present

## 2022-06-23 DIAGNOSIS — G4733 Obstructive sleep apnea (adult) (pediatric): Secondary | ICD-10-CM | POA: Diagnosis not present

## 2022-06-23 DIAGNOSIS — E785 Hyperlipidemia, unspecified: Secondary | ICD-10-CM | POA: Diagnosis not present

## 2022-06-23 DIAGNOSIS — I739 Peripheral vascular disease, unspecified: Secondary | ICD-10-CM | POA: Diagnosis not present

## 2022-06-23 DIAGNOSIS — Z471 Aftercare following joint replacement surgery: Secondary | ICD-10-CM | POA: Diagnosis not present

## 2022-06-23 DIAGNOSIS — F32A Depression, unspecified: Secondary | ICD-10-CM | POA: Diagnosis not present

## 2022-06-23 DIAGNOSIS — M109 Gout, unspecified: Secondary | ICD-10-CM | POA: Diagnosis not present

## 2022-06-23 DIAGNOSIS — Z556 Problems related to health literacy: Secondary | ICD-10-CM | POA: Diagnosis not present

## 2022-06-23 DIAGNOSIS — I10 Essential (primary) hypertension: Secondary | ICD-10-CM | POA: Diagnosis not present

## 2022-06-23 DIAGNOSIS — Z96651 Presence of right artificial knee joint: Secondary | ICD-10-CM | POA: Diagnosis not present

## 2022-06-23 DIAGNOSIS — T84010D Broken internal right hip prosthesis, subsequent encounter: Secondary | ICD-10-CM | POA: Diagnosis not present

## 2022-06-25 DIAGNOSIS — Z9181 History of falling: Secondary | ICD-10-CM | POA: Diagnosis not present

## 2022-06-25 DIAGNOSIS — Z556 Problems related to health literacy: Secondary | ICD-10-CM | POA: Diagnosis not present

## 2022-06-25 DIAGNOSIS — F32A Depression, unspecified: Secondary | ICD-10-CM | POA: Diagnosis not present

## 2022-06-25 DIAGNOSIS — I10 Essential (primary) hypertension: Secondary | ICD-10-CM | POA: Diagnosis not present

## 2022-06-25 DIAGNOSIS — I739 Peripheral vascular disease, unspecified: Secondary | ICD-10-CM | POA: Diagnosis not present

## 2022-06-25 DIAGNOSIS — Z87891 Personal history of nicotine dependence: Secondary | ICD-10-CM | POA: Diagnosis not present

## 2022-06-25 DIAGNOSIS — Z79891 Long term (current) use of opiate analgesic: Secondary | ICD-10-CM | POA: Diagnosis not present

## 2022-06-25 DIAGNOSIS — Z471 Aftercare following joint replacement surgery: Secondary | ICD-10-CM | POA: Diagnosis not present

## 2022-06-25 DIAGNOSIS — M109 Gout, unspecified: Secondary | ICD-10-CM | POA: Diagnosis not present

## 2022-06-25 DIAGNOSIS — Z7982 Long term (current) use of aspirin: Secondary | ICD-10-CM | POA: Diagnosis not present

## 2022-06-25 DIAGNOSIS — Z96642 Presence of left artificial hip joint: Secondary | ICD-10-CM | POA: Diagnosis not present

## 2022-06-25 DIAGNOSIS — E785 Hyperlipidemia, unspecified: Secondary | ICD-10-CM | POA: Diagnosis not present

## 2022-06-25 DIAGNOSIS — G4733 Obstructive sleep apnea (adult) (pediatric): Secondary | ICD-10-CM | POA: Diagnosis not present

## 2022-06-25 DIAGNOSIS — G8929 Other chronic pain: Secondary | ICD-10-CM | POA: Diagnosis not present

## 2022-06-25 DIAGNOSIS — Z96651 Presence of right artificial knee joint: Secondary | ICD-10-CM | POA: Diagnosis not present

## 2022-06-26 ENCOUNTER — Other Ambulatory Visit: Payer: Self-pay | Admitting: Family Medicine

## 2022-06-27 ENCOUNTER — Encounter: Payer: Self-pay | Admitting: Urology

## 2022-06-28 NOTE — Telephone Encounter (Signed)
Last OV says Myrbetriq 25 samples were given.  Is it ok to send in Gemtesa 75 rx, I didn't see an rx for Gemtesa samples?

## 2022-07-02 ENCOUNTER — Other Ambulatory Visit: Payer: Self-pay

## 2022-07-02 MED ORDER — GEMTESA 75 MG PO TABS: 75.0000 mg | ORAL_TABLET | Freq: Every day | ORAL | 11 refills | Status: DC

## 2022-07-06 DIAGNOSIS — G4733 Obstructive sleep apnea (adult) (pediatric): Secondary | ICD-10-CM | POA: Diagnosis not present

## 2022-07-06 DIAGNOSIS — Z556 Problems related to health literacy: Secondary | ICD-10-CM | POA: Diagnosis not present

## 2022-07-06 DIAGNOSIS — Z471 Aftercare following joint replacement surgery: Secondary | ICD-10-CM | POA: Diagnosis not present

## 2022-07-06 DIAGNOSIS — Z96642 Presence of left artificial hip joint: Secondary | ICD-10-CM | POA: Diagnosis not present

## 2022-07-06 DIAGNOSIS — G8929 Other chronic pain: Secondary | ICD-10-CM | POA: Diagnosis not present

## 2022-07-06 DIAGNOSIS — M109 Gout, unspecified: Secondary | ICD-10-CM | POA: Diagnosis not present

## 2022-07-06 DIAGNOSIS — Z96651 Presence of right artificial knee joint: Secondary | ICD-10-CM | POA: Diagnosis not present

## 2022-07-06 DIAGNOSIS — E785 Hyperlipidemia, unspecified: Secondary | ICD-10-CM | POA: Diagnosis not present

## 2022-07-06 DIAGNOSIS — I739 Peripheral vascular disease, unspecified: Secondary | ICD-10-CM | POA: Diagnosis not present

## 2022-07-06 DIAGNOSIS — F32A Depression, unspecified: Secondary | ICD-10-CM | POA: Diagnosis not present

## 2022-07-06 DIAGNOSIS — Z9181 History of falling: Secondary | ICD-10-CM | POA: Diagnosis not present

## 2022-07-06 DIAGNOSIS — Z7982 Long term (current) use of aspirin: Secondary | ICD-10-CM | POA: Diagnosis not present

## 2022-07-06 DIAGNOSIS — I10 Essential (primary) hypertension: Secondary | ICD-10-CM | POA: Diagnosis not present

## 2022-07-06 DIAGNOSIS — Z79891 Long term (current) use of opiate analgesic: Secondary | ICD-10-CM | POA: Diagnosis not present

## 2022-07-06 DIAGNOSIS — Z87891 Personal history of nicotine dependence: Secondary | ICD-10-CM | POA: Diagnosis not present

## 2022-07-08 DIAGNOSIS — Z79891 Long term (current) use of opiate analgesic: Secondary | ICD-10-CM | POA: Diagnosis not present

## 2022-07-08 DIAGNOSIS — Z556 Problems related to health literacy: Secondary | ICD-10-CM | POA: Diagnosis not present

## 2022-07-08 DIAGNOSIS — Z96651 Presence of right artificial knee joint: Secondary | ICD-10-CM | POA: Diagnosis not present

## 2022-07-08 DIAGNOSIS — I739 Peripheral vascular disease, unspecified: Secondary | ICD-10-CM | POA: Diagnosis not present

## 2022-07-08 DIAGNOSIS — G4733 Obstructive sleep apnea (adult) (pediatric): Secondary | ICD-10-CM | POA: Diagnosis not present

## 2022-07-08 DIAGNOSIS — F32A Depression, unspecified: Secondary | ICD-10-CM | POA: Diagnosis not present

## 2022-07-08 DIAGNOSIS — Z471 Aftercare following joint replacement surgery: Secondary | ICD-10-CM | POA: Diagnosis not present

## 2022-07-08 DIAGNOSIS — Z9181 History of falling: Secondary | ICD-10-CM | POA: Diagnosis not present

## 2022-07-08 DIAGNOSIS — Z7982 Long term (current) use of aspirin: Secondary | ICD-10-CM | POA: Diagnosis not present

## 2022-07-08 DIAGNOSIS — I10 Essential (primary) hypertension: Secondary | ICD-10-CM | POA: Diagnosis not present

## 2022-07-08 DIAGNOSIS — E785 Hyperlipidemia, unspecified: Secondary | ICD-10-CM | POA: Diagnosis not present

## 2022-07-08 DIAGNOSIS — Z96642 Presence of left artificial hip joint: Secondary | ICD-10-CM | POA: Diagnosis not present

## 2022-07-08 DIAGNOSIS — G8929 Other chronic pain: Secondary | ICD-10-CM | POA: Diagnosis not present

## 2022-07-08 DIAGNOSIS — M109 Gout, unspecified: Secondary | ICD-10-CM | POA: Diagnosis not present

## 2022-07-08 DIAGNOSIS — Z87891 Personal history of nicotine dependence: Secondary | ICD-10-CM | POA: Diagnosis not present

## 2022-08-02 ENCOUNTER — Telehealth: Payer: Self-pay

## 2022-08-02 DIAGNOSIS — E041 Nontoxic single thyroid nodule: Secondary | ICD-10-CM | POA: Diagnosis not present

## 2022-08-02 NOTE — Telephone Encounter (Signed)
Pt needs blood work ordered for up coming appt

## 2022-08-03 ENCOUNTER — Other Ambulatory Visit: Payer: Self-pay

## 2022-08-03 DIAGNOSIS — R7303 Prediabetes: Secondary | ICD-10-CM

## 2022-08-03 DIAGNOSIS — I10 Essential (primary) hypertension: Secondary | ICD-10-CM

## 2022-08-03 DIAGNOSIS — E785 Hyperlipidemia, unspecified: Secondary | ICD-10-CM

## 2022-08-03 NOTE — Telephone Encounter (Signed)
Last labs 06/2022: CMP, HgbA1c, CBC

## 2022-08-03 NOTE — Telephone Encounter (Signed)
Lipid, metabolic 7, urine ACR Diagnosis hyperlipidemia, hypertension  (Should be noted that he did have labs done at outside facility for other issues therefore these are the only test we need currently)

## 2022-08-03 NOTE — Telephone Encounter (Signed)
Orders placed and mychart message sent to patient

## 2022-08-06 DIAGNOSIS — I10 Essential (primary) hypertension: Secondary | ICD-10-CM | POA: Diagnosis not present

## 2022-08-06 DIAGNOSIS — E785 Hyperlipidemia, unspecified: Secondary | ICD-10-CM | POA: Diagnosis not present

## 2022-08-06 DIAGNOSIS — R7303 Prediabetes: Secondary | ICD-10-CM | POA: Diagnosis not present

## 2022-08-07 LAB — LIPID PANEL
Chol/HDL Ratio: 3.7 ratio (ref 0.0–5.0)
Cholesterol, Total: 169 mg/dL (ref 100–199)
HDL: 46 mg/dL (ref >39)
LDL Chol Calc (NIH): 102 mg/dL — ABNORMAL HIGH (ref 0–99)
Triglycerides: 117 mg/dL (ref 0–149)
VLDL Cholesterol Cal: 21 mg/dL (ref 5–40)

## 2022-08-07 LAB — MICROALBUMIN / CREATININE URINE RATIO
Creatinine, Urine: 125.8 mg/dL
Microalb/Creat Ratio: 11 mg/g creat (ref 0–29)
Microalbumin, Urine: 13.9 ug/mL

## 2022-08-07 LAB — COMPREHENSIVE METABOLIC PANEL
ALT: 16 IU/L (ref 0–44)
AST: 18 IU/L (ref 0–40)
Albumin/Globulin Ratio: 1.8 (ref 1.2–2.2)
Albumin: 4.4 g/dL (ref 3.8–4.8)
Alkaline Phosphatase: 144 IU/L — ABNORMAL HIGH (ref 44–121)
BUN/Creatinine Ratio: 13 (ref 10–24)
BUN: 14 mg/dL (ref 8–27)
Bilirubin Total: 0.8 mg/dL (ref 0.0–1.2)
CO2: 22 mmol/L (ref 20–29)
Calcium: 9.1 mg/dL (ref 8.6–10.2)
Chloride: 102 mmol/L (ref 96–106)
Creatinine, Ser: 1.1 mg/dL (ref 0.76–1.27)
Globulin, Total: 2.5 g/dL (ref 1.5–4.5)
Glucose: 102 mg/dL — ABNORMAL HIGH (ref 70–99)
Potassium: 4.7 mmol/L (ref 3.5–5.2)
Sodium: 142 mmol/L (ref 134–144)
Total Protein: 6.9 g/dL (ref 6.0–8.5)
eGFR: 70 mL/min/{1.73_m2} (ref >59)

## 2022-08-11 ENCOUNTER — Ambulatory Visit (INDEPENDENT_AMBULATORY_CARE_PROVIDER_SITE_OTHER): Payer: Medicare Other | Admitting: Family Medicine

## 2022-08-11 VITALS — BP 122/62 | Ht 67.0 in | Wt 189.0 lb

## 2022-08-11 DIAGNOSIS — I10 Essential (primary) hypertension: Secondary | ICD-10-CM | POA: Diagnosis not present

## 2022-08-11 DIAGNOSIS — E041 Nontoxic single thyroid nodule: Secondary | ICD-10-CM

## 2022-08-11 DIAGNOSIS — E785 Hyperlipidemia, unspecified: Secondary | ICD-10-CM

## 2022-08-11 NOTE — Progress Notes (Unsigned)
   Subjective:    Patient ID: [name] [name], male    DOB: December 24, 1950, 72 y.o.   MRN: [id number]  Hypertension This is a chronic problem. The current episode started more than 1 year ago. Risk factors for coronary artery disease include dyslipidemia. Treatments tried: norvasc. There are no compliance problems.    Patient recently had surgery Getting over this Will be having surgery of his right hip coming up as well He is debilitated using a walker and a cane not able to get around well   Review of Systems     Objective:   Physical Exam General-in no acute distress Eyes-no discharge Lungs-respiratory rate normal, CTA CV-no murmurs,RRR Extremities skin warm dry no edema Neuro grossly normal Behavior normal, alert  Patient had recent thyroid nodule biopsy and was seen by ENT and they basically told him to follow-up in 1 year      Assessment & Plan:  1. Hyperlipidemia, unspecified hyperlipidemia type Continue current measures  2. Essential hypertension Blood pressure under good control will monitor if he starts becoming lightheaded will cut down on medicine  3. Thyroid nodule Will communicate with his ENT doctor We just want to get guidance on whether or not this needs to be followed or removed?  Or rebiopsied in the future  Letter is being sent to the ENT Dr. Malon Kindle [facility] Office number [phone] Fax number [phone]

## 2022-08-12 ENCOUNTER — Telehealth: Payer: Self-pay | Admitting: Family Medicine

## 2022-08-12 ENCOUNTER — Encounter: Payer: Self-pay | Admitting: Family Medicine

## 2022-08-12 NOTE — Telephone Encounter (Signed)
error 

## 2022-08-12 NOTE — Telephone Encounter (Signed)
Front  I dictated a letter regarding this patient to his ENT doctor Please fax it to them Please also fax his thyroid ultrasound report and thyroid fine-needle aspiration biopsy report with this letter  Thank you so much-Dr. Lilyan Punt  Letter is being sent to the ENT Dr. Malon Kindle [facility] Office number [phone] Fax number [phone]

## 2022-08-25 ENCOUNTER — Other Ambulatory Visit: Payer: Self-pay | Admitting: Family Medicine

## 2022-08-27 DIAGNOSIS — T84090A Other mechanical complication of internal right hip prosthesis, initial encounter: Secondary | ICD-10-CM | POA: Diagnosis not present

## 2022-08-27 DIAGNOSIS — G4733 Obstructive sleep apnea (adult) (pediatric): Secondary | ICD-10-CM | POA: Diagnosis not present

## 2022-08-27 DIAGNOSIS — E7849 Other hyperlipidemia: Secondary | ICD-10-CM | POA: Diagnosis not present

## 2022-08-27 DIAGNOSIS — Z01818 Encounter for other preprocedural examination: Secondary | ICD-10-CM | POA: Diagnosis not present

## 2022-08-27 DIAGNOSIS — R7303 Prediabetes: Secondary | ICD-10-CM | POA: Diagnosis not present

## 2022-09-10 DIAGNOSIS — Z87891 Personal history of nicotine dependence: Secondary | ICD-10-CM | POA: Diagnosis not present

## 2022-09-10 DIAGNOSIS — T84021A Dislocation of internal left hip prosthesis, initial encounter: Secondary | ICD-10-CM | POA: Diagnosis not present

## 2022-09-10 DIAGNOSIS — M6281 Muscle weakness (generalized): Secondary | ICD-10-CM | POA: Diagnosis not present

## 2022-09-10 DIAGNOSIS — T84090D Other mechanical complication of internal right hip prosthesis, subsequent encounter: Secondary | ICD-10-CM | POA: Diagnosis not present

## 2022-09-10 DIAGNOSIS — R262 Difficulty in walking, not elsewhere classified: Secondary | ICD-10-CM | POA: Diagnosis not present

## 2022-09-10 DIAGNOSIS — M109 Gout, unspecified: Secondary | ICD-10-CM | POA: Diagnosis not present

## 2022-09-10 DIAGNOSIS — E441 Mild protein-calorie malnutrition: Secondary | ICD-10-CM | POA: Diagnosis not present

## 2022-09-10 DIAGNOSIS — G8918 Other acute postprocedural pain: Secondary | ICD-10-CM | POA: Diagnosis not present

## 2022-09-10 DIAGNOSIS — E785 Hyperlipidemia, unspecified: Secondary | ICD-10-CM | POA: Diagnosis not present

## 2022-09-10 DIAGNOSIS — Z7982 Long term (current) use of aspirin: Secondary | ICD-10-CM | POA: Diagnosis not present

## 2022-09-10 DIAGNOSIS — F32A Depression, unspecified: Secondary | ICD-10-CM | POA: Diagnosis not present

## 2022-09-10 DIAGNOSIS — T84090A Other mechanical complication of internal right hip prosthesis, initial encounter: Secondary | ICD-10-CM | POA: Diagnosis not present

## 2022-09-10 DIAGNOSIS — T84030A Mechanical loosening of internal right hip prosthetic joint, initial encounter: Secondary | ICD-10-CM | POA: Diagnosis not present

## 2022-09-10 DIAGNOSIS — I10 Essential (primary) hypertension: Secondary | ICD-10-CM | POA: Diagnosis not present

## 2022-09-10 DIAGNOSIS — Z96642 Presence of left artificial hip joint: Secondary | ICD-10-CM | POA: Diagnosis not present

## 2022-09-10 DIAGNOSIS — Z743 Need for continuous supervision: Secondary | ICD-10-CM | POA: Diagnosis not present

## 2022-09-10 DIAGNOSIS — G4733 Obstructive sleep apnea (adult) (pediatric): Secondary | ICD-10-CM | POA: Diagnosis not present

## 2022-09-10 DIAGNOSIS — Z96651 Presence of right artificial knee joint: Secondary | ICD-10-CM | POA: Diagnosis not present

## 2022-09-10 DIAGNOSIS — R531 Weakness: Secondary | ICD-10-CM | POA: Diagnosis not present

## 2022-09-10 DIAGNOSIS — R7303 Prediabetes: Secondary | ICD-10-CM | POA: Diagnosis not present

## 2022-09-10 DIAGNOSIS — R29898 Other symptoms and signs involving the musculoskeletal system: Secondary | ICD-10-CM | POA: Diagnosis not present

## 2022-09-10 DIAGNOSIS — R279 Unspecified lack of coordination: Secondary | ICD-10-CM | POA: Diagnosis not present

## 2022-09-10 DIAGNOSIS — T84031A Mechanical loosening of internal left hip prosthetic joint, initial encounter: Secondary | ICD-10-CM | POA: Diagnosis not present

## 2022-09-10 DIAGNOSIS — Z471 Aftercare following joint replacement surgery: Secondary | ICD-10-CM | POA: Diagnosis not present

## 2022-09-10 DIAGNOSIS — T84028S Dislocation of other internal joint prosthesis, sequela: Secondary | ICD-10-CM | POA: Diagnosis not present

## 2022-09-10 DIAGNOSIS — Z96641 Presence of right artificial hip joint: Secondary | ICD-10-CM | POA: Diagnosis not present

## 2022-09-10 DIAGNOSIS — M25551 Pain in right hip: Secondary | ICD-10-CM | POA: Diagnosis not present

## 2022-09-10 HISTORY — PX: REVISION TOTAL HIP ARTHROPLASTY: SHX766

## 2022-09-14 ENCOUNTER — Other Ambulatory Visit: Payer: Self-pay | Admitting: Family Medicine

## 2022-09-14 DIAGNOSIS — R262 Difficulty in walking, not elsewhere classified: Secondary | ICD-10-CM | POA: Diagnosis not present

## 2022-09-14 DIAGNOSIS — R6889 Other general symptoms and signs: Secondary | ICD-10-CM | POA: Diagnosis not present

## 2022-09-14 DIAGNOSIS — E785 Hyperlipidemia, unspecified: Secondary | ICD-10-CM | POA: Diagnosis not present

## 2022-09-14 DIAGNOSIS — I10 Essential (primary) hypertension: Secondary | ICD-10-CM | POA: Diagnosis not present

## 2022-09-14 DIAGNOSIS — E559 Vitamin D deficiency, unspecified: Secondary | ICD-10-CM | POA: Diagnosis not present

## 2022-09-14 DIAGNOSIS — R110 Nausea: Secondary | ICD-10-CM | POA: Diagnosis not present

## 2022-09-14 DIAGNOSIS — G8918 Other acute postprocedural pain: Secondary | ICD-10-CM | POA: Diagnosis not present

## 2022-09-14 DIAGNOSIS — Z743 Need for continuous supervision: Secondary | ICD-10-CM | POA: Diagnosis not present

## 2022-09-14 DIAGNOSIS — G4733 Obstructive sleep apnea (adult) (pediatric): Secondary | ICD-10-CM | POA: Diagnosis not present

## 2022-09-14 DIAGNOSIS — M6281 Muscle weakness (generalized): Secondary | ICD-10-CM | POA: Diagnosis not present

## 2022-09-14 DIAGNOSIS — D649 Anemia, unspecified: Secondary | ICD-10-CM | POA: Diagnosis not present

## 2022-09-14 DIAGNOSIS — R5381 Other malaise: Secondary | ICD-10-CM | POA: Diagnosis not present

## 2022-09-14 DIAGNOSIS — I214 Non-ST elevation (NSTEMI) myocardial infarction: Secondary | ICD-10-CM | POA: Diagnosis not present

## 2022-09-14 DIAGNOSIS — K922 Gastrointestinal hemorrhage, unspecified: Secondary | ICD-10-CM | POA: Diagnosis not present

## 2022-09-14 DIAGNOSIS — R279 Unspecified lack of coordination: Secondary | ICD-10-CM | POA: Diagnosis not present

## 2022-09-14 DIAGNOSIS — I499 Cardiac arrhythmia, unspecified: Secondary | ICD-10-CM | POA: Diagnosis not present

## 2022-09-14 DIAGNOSIS — E860 Dehydration: Secondary | ICD-10-CM | POA: Diagnosis not present

## 2022-09-14 DIAGNOSIS — T84090D Other mechanical complication of internal right hip prosthesis, subsequent encounter: Secondary | ICD-10-CM | POA: Diagnosis not present

## 2022-09-14 DIAGNOSIS — E441 Mild protein-calorie malnutrition: Secondary | ICD-10-CM | POA: Diagnosis not present

## 2022-09-14 DIAGNOSIS — T84028S Dislocation of other internal joint prosthesis, sequela: Secondary | ICD-10-CM | POA: Diagnosis not present

## 2022-09-14 DIAGNOSIS — Z96641 Presence of right artificial hip joint: Secondary | ICD-10-CM | POA: Diagnosis not present

## 2022-09-14 DIAGNOSIS — R29898 Other symptoms and signs involving the musculoskeletal system: Secondary | ICD-10-CM | POA: Diagnosis not present

## 2022-09-14 DIAGNOSIS — R7889 Finding of other specified substances, not normally found in blood: Secondary | ICD-10-CM | POA: Diagnosis not present

## 2022-09-14 DIAGNOSIS — M109 Gout, unspecified: Secondary | ICD-10-CM | POA: Diagnosis not present

## 2022-09-14 DIAGNOSIS — M25551 Pain in right hip: Secondary | ICD-10-CM | POA: Diagnosis not present

## 2022-09-15 DIAGNOSIS — I10 Essential (primary) hypertension: Secondary | ICD-10-CM | POA: Diagnosis not present

## 2022-09-15 DIAGNOSIS — T84028S Dislocation of other internal joint prosthesis, sequela: Secondary | ICD-10-CM | POA: Diagnosis not present

## 2022-09-15 DIAGNOSIS — G4733 Obstructive sleep apnea (adult) (pediatric): Secondary | ICD-10-CM | POA: Diagnosis not present

## 2022-09-15 DIAGNOSIS — E785 Hyperlipidemia, unspecified: Secondary | ICD-10-CM | POA: Diagnosis not present

## 2022-09-15 DIAGNOSIS — Z96641 Presence of right artificial hip joint: Secondary | ICD-10-CM | POA: Diagnosis not present

## 2022-09-15 DIAGNOSIS — M6281 Muscle weakness (generalized): Secondary | ICD-10-CM | POA: Diagnosis not present

## 2022-09-17 DIAGNOSIS — I10 Essential (primary) hypertension: Secondary | ICD-10-CM | POA: Diagnosis not present

## 2022-09-17 DIAGNOSIS — E559 Vitamin D deficiency, unspecified: Secondary | ICD-10-CM | POA: Diagnosis not present

## 2022-09-20 DIAGNOSIS — G4733 Obstructive sleep apnea (adult) (pediatric): Secondary | ICD-10-CM | POA: Diagnosis not present

## 2022-09-20 DIAGNOSIS — D649 Anemia, unspecified: Secondary | ICD-10-CM | POA: Diagnosis not present

## 2022-09-20 DIAGNOSIS — T84028S Dislocation of other internal joint prosthesis, sequela: Secondary | ICD-10-CM | POA: Diagnosis not present

## 2022-09-20 DIAGNOSIS — E785 Hyperlipidemia, unspecified: Secondary | ICD-10-CM | POA: Diagnosis not present

## 2022-09-20 DIAGNOSIS — Z96641 Presence of right artificial hip joint: Secondary | ICD-10-CM | POA: Diagnosis not present

## 2022-09-20 DIAGNOSIS — M6281 Muscle weakness (generalized): Secondary | ICD-10-CM | POA: Diagnosis not present

## 2022-09-20 DIAGNOSIS — E559 Vitamin D deficiency, unspecified: Secondary | ICD-10-CM | POA: Diagnosis not present

## 2022-09-20 DIAGNOSIS — I10 Essential (primary) hypertension: Secondary | ICD-10-CM | POA: Diagnosis not present

## 2022-09-21 DIAGNOSIS — R5381 Other malaise: Secondary | ICD-10-CM | POA: Diagnosis not present

## 2022-09-21 DIAGNOSIS — Z96641 Presence of right artificial hip joint: Secondary | ICD-10-CM | POA: Diagnosis not present

## 2022-09-24 DIAGNOSIS — M25551 Pain in right hip: Secondary | ICD-10-CM | POA: Diagnosis not present

## 2022-09-24 DIAGNOSIS — I499 Cardiac arrhythmia, unspecified: Secondary | ICD-10-CM | POA: Diagnosis not present

## 2022-09-24 DIAGNOSIS — R6889 Other general symptoms and signs: Secondary | ICD-10-CM | POA: Diagnosis not present

## 2022-09-24 DIAGNOSIS — G8918 Other acute postprocedural pain: Secondary | ICD-10-CM | POA: Diagnosis not present

## 2022-09-24 DIAGNOSIS — E860 Dehydration: Secondary | ICD-10-CM | POA: Diagnosis not present

## 2022-09-24 DIAGNOSIS — R7889 Finding of other specified substances, not normally found in blood: Secondary | ICD-10-CM | POA: Diagnosis not present

## 2022-09-24 DIAGNOSIS — Z743 Need for continuous supervision: Secondary | ICD-10-CM | POA: Diagnosis not present

## 2022-09-24 DIAGNOSIS — R110 Nausea: Secondary | ICD-10-CM | POA: Diagnosis not present

## 2022-09-24 DIAGNOSIS — I214 Non-ST elevation (NSTEMI) myocardial infarction: Secondary | ICD-10-CM | POA: Diagnosis not present

## 2022-09-24 DIAGNOSIS — D649 Anemia, unspecified: Secondary | ICD-10-CM | POA: Diagnosis not present

## 2022-09-24 DIAGNOSIS — I10 Essential (primary) hypertension: Secondary | ICD-10-CM | POA: Diagnosis not present

## 2022-09-24 DIAGNOSIS — K922 Gastrointestinal hemorrhage, unspecified: Secondary | ICD-10-CM | POA: Diagnosis not present

## 2022-09-25 DIAGNOSIS — F32A Depression, unspecified: Secondary | ICD-10-CM | POA: Diagnosis not present

## 2022-09-25 DIAGNOSIS — M6281 Muscle weakness (generalized): Secondary | ICD-10-CM | POA: Diagnosis not present

## 2022-09-25 DIAGNOSIS — K922 Gastrointestinal hemorrhage, unspecified: Secondary | ICD-10-CM | POA: Diagnosis not present

## 2022-09-25 DIAGNOSIS — E860 Dehydration: Secondary | ICD-10-CM | POA: Diagnosis not present

## 2022-09-25 DIAGNOSIS — I10 Essential (primary) hypertension: Secondary | ICD-10-CM | POA: Diagnosis not present

## 2022-09-25 DIAGNOSIS — R278 Other lack of coordination: Secondary | ICD-10-CM | POA: Diagnosis not present

## 2022-09-25 DIAGNOSIS — M25551 Pain in right hip: Secondary | ICD-10-CM | POA: Diagnosis not present

## 2022-09-25 DIAGNOSIS — Z96651 Presence of right artificial knee joint: Secondary | ICD-10-CM | POA: Diagnosis not present

## 2022-09-25 DIAGNOSIS — I110 Hypertensive heart disease with heart failure: Secondary | ICD-10-CM | POA: Diagnosis not present

## 2022-09-25 DIAGNOSIS — R69 Illness, unspecified: Secondary | ICD-10-CM | POA: Diagnosis not present

## 2022-09-25 DIAGNOSIS — G4733 Obstructive sleep apnea (adult) (pediatric): Secondary | ICD-10-CM | POA: Diagnosis not present

## 2022-09-25 DIAGNOSIS — I5022 Chronic systolic (congestive) heart failure: Secondary | ICD-10-CM | POA: Diagnosis not present

## 2022-09-25 DIAGNOSIS — K921 Melena: Secondary | ICD-10-CM | POA: Diagnosis not present

## 2022-09-25 DIAGNOSIS — I214 Non-ST elevation (NSTEMI) myocardial infarction: Secondary | ICD-10-CM | POA: Diagnosis not present

## 2022-09-25 DIAGNOSIS — I509 Heart failure, unspecified: Secondary | ICD-10-CM | POA: Diagnosis not present

## 2022-09-25 DIAGNOSIS — Z4789 Encounter for other orthopedic aftercare: Secondary | ICD-10-CM | POA: Diagnosis not present

## 2022-09-25 DIAGNOSIS — I252 Old myocardial infarction: Secondary | ICD-10-CM | POA: Diagnosis not present

## 2022-09-25 DIAGNOSIS — Z96649 Presence of unspecified artificial hip joint: Secondary | ICD-10-CM | POA: Diagnosis not present

## 2022-09-25 DIAGNOSIS — T39395A Adverse effect of other nonsteroidal anti-inflammatory drugs [NSAID], initial encounter: Secondary | ICD-10-CM | POA: Diagnosis not present

## 2022-09-25 DIAGNOSIS — I2489 Other forms of acute ischemic heart disease: Secondary | ICD-10-CM | POA: Diagnosis not present

## 2022-09-25 DIAGNOSIS — D62 Acute posthemorrhagic anemia: Secondary | ICD-10-CM | POA: Diagnosis not present

## 2022-09-25 DIAGNOSIS — I5021 Acute systolic (congestive) heart failure: Secondary | ICD-10-CM | POA: Diagnosis not present

## 2022-09-25 DIAGNOSIS — Z743 Need for continuous supervision: Secondary | ICD-10-CM | POA: Diagnosis not present

## 2022-09-25 DIAGNOSIS — K264 Chronic or unspecified duodenal ulcer with hemorrhage: Secondary | ICD-10-CM | POA: Diagnosis not present

## 2022-09-25 DIAGNOSIS — Z87891 Personal history of nicotine dependence: Secondary | ICD-10-CM | POA: Diagnosis not present

## 2022-09-25 DIAGNOSIS — G8918 Other acute postprocedural pain: Secondary | ICD-10-CM | POA: Diagnosis not present

## 2022-09-25 DIAGNOSIS — M1612 Unilateral primary osteoarthritis, left hip: Secondary | ICD-10-CM | POA: Diagnosis not present

## 2022-09-25 DIAGNOSIS — K3189 Other diseases of stomach and duodenum: Secondary | ICD-10-CM | POA: Diagnosis not present

## 2022-09-25 DIAGNOSIS — D649 Anemia, unspecified: Secondary | ICD-10-CM | POA: Diagnosis not present

## 2022-09-25 DIAGNOSIS — Z96641 Presence of right artificial hip joint: Secondary | ICD-10-CM | POA: Diagnosis not present

## 2022-09-25 DIAGNOSIS — I493 Ventricular premature depolarization: Secondary | ICD-10-CM | POA: Diagnosis not present

## 2022-09-25 DIAGNOSIS — Z8679 Personal history of other diseases of the circulatory system: Secondary | ICD-10-CM | POA: Diagnosis not present

## 2022-09-25 DIAGNOSIS — Z96643 Presence of artificial hip joint, bilateral: Secondary | ICD-10-CM | POA: Diagnosis not present

## 2022-09-25 DIAGNOSIS — E785 Hyperlipidemia, unspecified: Secondary | ICD-10-CM | POA: Diagnosis not present

## 2022-09-25 DIAGNOSIS — T84028S Dislocation of other internal joint prosthesis, sequela: Secondary | ICD-10-CM | POA: Diagnosis not present

## 2022-09-30 DIAGNOSIS — M6281 Muscle weakness (generalized): Secondary | ICD-10-CM | POA: Diagnosis not present

## 2022-09-30 DIAGNOSIS — R531 Weakness: Secondary | ICD-10-CM | POA: Diagnosis not present

## 2022-09-30 DIAGNOSIS — K922 Gastrointestinal hemorrhage, unspecified: Secondary | ICD-10-CM | POA: Diagnosis not present

## 2022-09-30 DIAGNOSIS — M1612 Unilateral primary osteoarthritis, left hip: Secondary | ICD-10-CM | POA: Diagnosis not present

## 2022-09-30 DIAGNOSIS — Z4789 Encounter for other orthopedic aftercare: Secondary | ICD-10-CM | POA: Diagnosis not present

## 2022-09-30 DIAGNOSIS — I214 Non-ST elevation (NSTEMI) myocardial infarction: Secondary | ICD-10-CM | POA: Diagnosis not present

## 2022-09-30 DIAGNOSIS — I5022 Chronic systolic (congestive) heart failure: Secondary | ICD-10-CM | POA: Diagnosis not present

## 2022-09-30 DIAGNOSIS — G4733 Obstructive sleep apnea (adult) (pediatric): Secondary | ICD-10-CM | POA: Diagnosis not present

## 2022-09-30 DIAGNOSIS — Z8719 Personal history of other diseases of the digestive system: Secondary | ICD-10-CM | POA: Diagnosis not present

## 2022-09-30 DIAGNOSIS — D62 Acute posthemorrhagic anemia: Secondary | ICD-10-CM | POA: Diagnosis not present

## 2022-09-30 DIAGNOSIS — F32A Depression, unspecified: Secondary | ICD-10-CM | POA: Diagnosis not present

## 2022-09-30 DIAGNOSIS — I10 Essential (primary) hypertension: Secondary | ICD-10-CM | POA: Diagnosis not present

## 2022-09-30 DIAGNOSIS — I252 Old myocardial infarction: Secondary | ICD-10-CM | POA: Diagnosis not present

## 2022-09-30 DIAGNOSIS — E785 Hyperlipidemia, unspecified: Secondary | ICD-10-CM | POA: Diagnosis not present

## 2022-09-30 DIAGNOSIS — Z96641 Presence of right artificial hip joint: Secondary | ICD-10-CM | POA: Diagnosis not present

## 2022-09-30 DIAGNOSIS — D649 Anemia, unspecified: Secondary | ICD-10-CM | POA: Diagnosis not present

## 2022-09-30 DIAGNOSIS — R278 Other lack of coordination: Secondary | ICD-10-CM | POA: Diagnosis not present

## 2022-09-30 DIAGNOSIS — Z743 Need for continuous supervision: Secondary | ICD-10-CM | POA: Diagnosis not present

## 2022-09-30 DIAGNOSIS — Z8679 Personal history of other diseases of the circulatory system: Secondary | ICD-10-CM | POA: Diagnosis not present

## 2022-09-30 DIAGNOSIS — T84028S Dislocation of other internal joint prosthesis, sequela: Secondary | ICD-10-CM | POA: Diagnosis not present

## 2022-09-30 DIAGNOSIS — Z96649 Presence of unspecified artificial hip joint: Secondary | ICD-10-CM | POA: Diagnosis not present

## 2022-09-30 DIAGNOSIS — R69 Illness, unspecified: Secondary | ICD-10-CM | POA: Diagnosis not present

## 2022-10-01 DIAGNOSIS — I214 Non-ST elevation (NSTEMI) myocardial infarction: Secondary | ICD-10-CM | POA: Diagnosis not present

## 2022-10-01 DIAGNOSIS — K922 Gastrointestinal hemorrhage, unspecified: Secondary | ICD-10-CM | POA: Diagnosis not present

## 2022-10-01 DIAGNOSIS — I10 Essential (primary) hypertension: Secondary | ICD-10-CM | POA: Diagnosis not present

## 2022-10-01 DIAGNOSIS — Z96641 Presence of right artificial hip joint: Secondary | ICD-10-CM | POA: Diagnosis not present

## 2022-10-01 DIAGNOSIS — D649 Anemia, unspecified: Secondary | ICD-10-CM | POA: Diagnosis not present

## 2022-10-01 DIAGNOSIS — I5022 Chronic systolic (congestive) heart failure: Secondary | ICD-10-CM | POA: Diagnosis not present

## 2022-10-04 DIAGNOSIS — D649 Anemia, unspecified: Secondary | ICD-10-CM | POA: Diagnosis not present

## 2022-10-04 DIAGNOSIS — R531 Weakness: Secondary | ICD-10-CM | POA: Diagnosis not present

## 2022-10-04 DIAGNOSIS — K922 Gastrointestinal hemorrhage, unspecified: Secondary | ICD-10-CM | POA: Diagnosis not present

## 2022-10-04 DIAGNOSIS — Z96641 Presence of right artificial hip joint: Secondary | ICD-10-CM | POA: Diagnosis not present

## 2022-10-06 DIAGNOSIS — Z96641 Presence of right artificial hip joint: Secondary | ICD-10-CM | POA: Diagnosis not present

## 2022-10-06 DIAGNOSIS — I5022 Chronic systolic (congestive) heart failure: Secondary | ICD-10-CM | POA: Diagnosis not present

## 2022-10-06 DIAGNOSIS — Z8719 Personal history of other diseases of the digestive system: Secondary | ICD-10-CM | POA: Diagnosis not present

## 2022-10-06 DIAGNOSIS — D62 Acute posthemorrhagic anemia: Secondary | ICD-10-CM | POA: Diagnosis not present

## 2022-10-06 DIAGNOSIS — I10 Essential (primary) hypertension: Secondary | ICD-10-CM | POA: Diagnosis not present

## 2022-10-08 DIAGNOSIS — I10 Essential (primary) hypertension: Secondary | ICD-10-CM | POA: Diagnosis not present

## 2022-10-08 DIAGNOSIS — I5022 Chronic systolic (congestive) heart failure: Secondary | ICD-10-CM | POA: Diagnosis not present

## 2022-10-08 DIAGNOSIS — Z96641 Presence of right artificial hip joint: Secondary | ICD-10-CM | POA: Diagnosis not present

## 2022-10-08 DIAGNOSIS — Z8719 Personal history of other diseases of the digestive system: Secondary | ICD-10-CM | POA: Diagnosis not present

## 2022-10-08 DIAGNOSIS — D62 Acute posthemorrhagic anemia: Secondary | ICD-10-CM | POA: Diagnosis not present

## 2022-10-11 DIAGNOSIS — I10 Essential (primary) hypertension: Secondary | ICD-10-CM | POA: Diagnosis not present

## 2022-10-11 DIAGNOSIS — K922 Gastrointestinal hemorrhage, unspecified: Secondary | ICD-10-CM | POA: Diagnosis not present

## 2022-10-11 DIAGNOSIS — Z96641 Presence of right artificial hip joint: Secondary | ICD-10-CM | POA: Diagnosis not present

## 2022-10-11 DIAGNOSIS — D62 Acute posthemorrhagic anemia: Secondary | ICD-10-CM | POA: Diagnosis not present

## 2022-10-11 DIAGNOSIS — Z8719 Personal history of other diseases of the digestive system: Secondary | ICD-10-CM | POA: Diagnosis not present

## 2022-10-11 DIAGNOSIS — I5022 Chronic systolic (congestive) heart failure: Secondary | ICD-10-CM | POA: Diagnosis not present

## 2022-10-13 DIAGNOSIS — Z96641 Presence of right artificial hip joint: Secondary | ICD-10-CM | POA: Diagnosis not present

## 2022-10-13 DIAGNOSIS — K922 Gastrointestinal hemorrhage, unspecified: Secondary | ICD-10-CM | POA: Diagnosis not present

## 2022-10-13 DIAGNOSIS — D62 Acute posthemorrhagic anemia: Secondary | ICD-10-CM | POA: Diagnosis not present

## 2022-10-13 DIAGNOSIS — Z8719 Personal history of other diseases of the digestive system: Secondary | ICD-10-CM | POA: Diagnosis not present

## 2022-10-13 DIAGNOSIS — I5022 Chronic systolic (congestive) heart failure: Secondary | ICD-10-CM | POA: Diagnosis not present

## 2022-10-13 DIAGNOSIS — I10 Essential (primary) hypertension: Secondary | ICD-10-CM | POA: Diagnosis not present

## 2022-10-15 DIAGNOSIS — I10 Essential (primary) hypertension: Secondary | ICD-10-CM | POA: Diagnosis not present

## 2022-10-15 DIAGNOSIS — Z96641 Presence of right artificial hip joint: Secondary | ICD-10-CM | POA: Diagnosis not present

## 2022-10-15 DIAGNOSIS — K922 Gastrointestinal hemorrhage, unspecified: Secondary | ICD-10-CM | POA: Diagnosis not present

## 2022-10-15 DIAGNOSIS — R531 Weakness: Secondary | ICD-10-CM | POA: Diagnosis not present

## 2022-10-18 DIAGNOSIS — R531 Weakness: Secondary | ICD-10-CM | POA: Diagnosis not present

## 2022-10-18 DIAGNOSIS — I10 Essential (primary) hypertension: Secondary | ICD-10-CM | POA: Diagnosis not present

## 2022-10-18 DIAGNOSIS — E785 Hyperlipidemia, unspecified: Secondary | ICD-10-CM | POA: Diagnosis not present

## 2022-10-19 ENCOUNTER — Telehealth: Payer: Self-pay

## 2022-10-19 DIAGNOSIS — K922 Gastrointestinal hemorrhage, unspecified: Secondary | ICD-10-CM | POA: Diagnosis not present

## 2022-10-19 DIAGNOSIS — Z791 Long term (current) use of non-steroidal anti-inflammatories (NSAID): Secondary | ICD-10-CM | POA: Diagnosis not present

## 2022-10-19 DIAGNOSIS — I5022 Chronic systolic (congestive) heart failure: Secondary | ICD-10-CM | POA: Diagnosis not present

## 2022-10-19 DIAGNOSIS — E785 Hyperlipidemia, unspecified: Secondary | ICD-10-CM | POA: Diagnosis not present

## 2022-10-19 DIAGNOSIS — I252 Old myocardial infarction: Secondary | ICD-10-CM | POA: Diagnosis not present

## 2022-10-19 DIAGNOSIS — Z471 Aftercare following joint replacement surgery: Secondary | ICD-10-CM | POA: Diagnosis not present

## 2022-10-19 DIAGNOSIS — Z8719 Personal history of other diseases of the digestive system: Secondary | ICD-10-CM | POA: Diagnosis not present

## 2022-10-19 DIAGNOSIS — Z7982 Long term (current) use of aspirin: Secondary | ICD-10-CM | POA: Diagnosis not present

## 2022-10-19 DIAGNOSIS — F32A Depression, unspecified: Secondary | ICD-10-CM | POA: Diagnosis not present

## 2022-10-19 DIAGNOSIS — G4733 Obstructive sleep apnea (adult) (pediatric): Secondary | ICD-10-CM | POA: Diagnosis not present

## 2022-10-19 DIAGNOSIS — I110 Hypertensive heart disease with heart failure: Secondary | ICD-10-CM | POA: Diagnosis not present

## 2022-10-19 DIAGNOSIS — D62 Acute posthemorrhagic anemia: Secondary | ICD-10-CM | POA: Diagnosis not present

## 2022-10-19 DIAGNOSIS — Z96643 Presence of artificial hip joint, bilateral: Secondary | ICD-10-CM | POA: Diagnosis not present

## 2022-10-19 NOTE — Telephone Encounter (Signed)
Stacey (PT) [facility] (# [phone]) Needs Verbal order for PT 2x a week for 3 weeks, 1x week 5 weeks.

## 2022-10-19 NOTE — Telephone Encounter (Signed)
Called and patient left message for patient to call office. Nurse Note* Need verify statins BP medications.

## 2022-10-19 NOTE — Telephone Encounter (Signed)
Called Misty Stanley (PT) [facility] [phone]) advised per Dr Lorin Picket It would be fine to give her the verbal order thank you. Misty Stanley (PT) verbalized understanding.

## 2022-10-19 NOTE — Telephone Encounter (Signed)
It would be fine to give her the verbal order thank you

## 2022-10-19 NOTE — Telephone Encounter (Signed)
Error

## 2022-10-25 ENCOUNTER — Other Ambulatory Visit: Payer: Self-pay | Admitting: Family Medicine

## 2022-10-26 ENCOUNTER — Ambulatory Visit (INDEPENDENT_AMBULATORY_CARE_PROVIDER_SITE_OTHER): Payer: Medicare Other | Admitting: Family Medicine

## 2022-10-26 VITALS — BP 121/74 | HR 118 | Wt 163.4 lb

## 2022-10-26 DIAGNOSIS — E785 Hyperlipidemia, unspecified: Secondary | ICD-10-CM | POA: Diagnosis not present

## 2022-10-26 DIAGNOSIS — E611 Iron deficiency: Secondary | ICD-10-CM | POA: Diagnosis not present

## 2022-10-26 DIAGNOSIS — R9430 Abnormal result of cardiovascular function study, unspecified: Secondary | ICD-10-CM | POA: Diagnosis not present

## 2022-10-26 DIAGNOSIS — K269 Duodenal ulcer, unspecified as acute or chronic, without hemorrhage or perforation: Secondary | ICD-10-CM

## 2022-10-26 DIAGNOSIS — Z79899 Other long term (current) drug therapy: Secondary | ICD-10-CM | POA: Diagnosis not present

## 2022-10-26 DIAGNOSIS — I10 Essential (primary) hypertension: Secondary | ICD-10-CM

## 2022-10-26 DIAGNOSIS — R931 Abnormal findings on diagnostic imaging of heart and coronary circulation: Secondary | ICD-10-CM

## 2022-10-26 MED ORDER — PANTOPRAZOLE SODIUM 40 MG PO TBEC: 40.0000 mg | DELAYED_RELEASE_TABLET | Freq: Two times a day (BID) | ORAL | 1 refills | Status: DC

## 2022-10-26 NOTE — Progress Notes (Unsigned)
   Subjective:    Patient ID: LOY MARETT, male    DOB: 31-Oct-1950, 72 y.o.   MRN: [id number]  HPI Patient arrives for hospital follow up. Patient states he has questions about medications which ones should he take or discontinue.  Very difficult situation Patient had recent surgery Then he suffered a GI bleed Was in the hospital thanks transfusion Was transferred from [facility] to [facility] in [place].  Now has been released.  While he was in the hospital his ejection fraction was low.  Diagnosed with CHF.  Was placed on losartan as well as spironolactone and pantoprazole patient has not started these medicines Patient somewhat frustrated by his predicament but he is trying to make the best of it denies any chest tightness pressure pain shortness of breath Patient is debilitated walking with a walker Review of Systems     Objective:   Physical Exam  General-in no acute distress Eyes-no discharge Lungs-respiratory rate normal, CTA CV-no murmurs,RRR Extremities skin warm dry no edema Neuro grossly normal Behavior normal, alert       Assessment & Plan:  I 1. Hyperlipidemia, unspecified hyperlipidemia type He currently is on atorvastatin he should continue this  2. Benign essential hypertension We will check lab work.  Await the results - CBC with Differential - Basic Metabolic Panel (7)  3. High risk medication use Order labs - Hepatic Function Panel  4. Iron deficiency Iron deficiency anemia check lab work await results - Iron, TIBC and Ferritin Panel  5. Low left ventricular ejection fraction Will touch base with cardiology to see if they want [place] to repeat his echo Start losartan 25 mg half tablet daily Hold off on spironolactone Have patient follow-up in a few weeks time for recheck Metoprolol ER 1/2 tablet daily Patient with duodenal ulcer seen on EGD Needs pantoprazole twice daily When the patient follows up we will discuss setting him  up with gastroenterology for follow-up EGD Message was sent to his cardiologist regarding whether or not to repeat echo

## 2022-10-27 DIAGNOSIS — Z471 Aftercare following joint replacement surgery: Secondary | ICD-10-CM | POA: Diagnosis not present

## 2022-10-27 DIAGNOSIS — M5136 Other intervertebral disc degeneration, lumbar region: Secondary | ICD-10-CM | POA: Diagnosis not present

## 2022-10-27 DIAGNOSIS — Z96643 Presence of artificial hip joint, bilateral: Secondary | ICD-10-CM | POA: Diagnosis not present

## 2022-10-27 DIAGNOSIS — I708 Atherosclerosis of other arteries: Secondary | ICD-10-CM | POA: Diagnosis not present

## 2022-10-27 DIAGNOSIS — M533 Sacrococcygeal disorders, not elsewhere classified: Secondary | ICD-10-CM | POA: Diagnosis not present

## 2022-10-27 DIAGNOSIS — Z96641 Presence of right artificial hip joint: Secondary | ICD-10-CM | POA: Diagnosis not present

## 2022-10-28 ENCOUNTER — Telehealth: Payer: Self-pay | Admitting: Internal Medicine

## 2022-10-28 ENCOUNTER — Telehealth: Payer: Self-pay | Admitting: Family Medicine

## 2022-10-28 DIAGNOSIS — E785 Hyperlipidemia, unspecified: Secondary | ICD-10-CM | POA: Diagnosis not present

## 2022-10-28 DIAGNOSIS — G4733 Obstructive sleep apnea (adult) (pediatric): Secondary | ICD-10-CM | POA: Diagnosis not present

## 2022-10-28 DIAGNOSIS — Z8719 Personal history of other diseases of the digestive system: Secondary | ICD-10-CM | POA: Diagnosis not present

## 2022-10-28 DIAGNOSIS — K922 Gastrointestinal hemorrhage, unspecified: Secondary | ICD-10-CM | POA: Diagnosis not present

## 2022-10-28 DIAGNOSIS — D62 Acute posthemorrhagic anemia: Secondary | ICD-10-CM | POA: Diagnosis not present

## 2022-10-28 DIAGNOSIS — F32A Depression, unspecified: Secondary | ICD-10-CM | POA: Diagnosis not present

## 2022-10-28 DIAGNOSIS — I5022 Chronic systolic (congestive) heart failure: Secondary | ICD-10-CM | POA: Diagnosis not present

## 2022-10-28 DIAGNOSIS — I110 Hypertensive heart disease with heart failure: Secondary | ICD-10-CM | POA: Diagnosis not present

## 2022-10-28 DIAGNOSIS — Z791 Long term (current) use of non-steroidal anti-inflammatories (NSAID): Secondary | ICD-10-CM | POA: Diagnosis not present

## 2022-10-28 DIAGNOSIS — I252 Old myocardial infarction: Secondary | ICD-10-CM | POA: Diagnosis not present

## 2022-10-28 DIAGNOSIS — Z471 Aftercare following joint replacement surgery: Secondary | ICD-10-CM | POA: Diagnosis not present

## 2022-10-28 DIAGNOSIS — Z96643 Presence of artificial hip joint, bilateral: Secondary | ICD-10-CM | POA: Diagnosis not present

## 2022-10-28 DIAGNOSIS — Z7982 Long term (current) use of aspirin: Secondary | ICD-10-CM | POA: Diagnosis not present

## 2022-10-28 NOTE — Telephone Encounter (Signed)
It would be fine to go ahead and give the verbal order thank you

## 2022-10-28 NOTE — Telephone Encounter (Signed)
Called [name] [name] left message for her to call office.

## 2022-10-28 NOTE — Telephone Encounter (Signed)
Please arrange office visit for this patient with me, new patient, diagnosis peptic ulcer disease.  Thank you

## 2022-10-28 NOTE — Telephone Encounter (Signed)
Malori called and advised per Dr Lorin Picket  It would be fine to go ahead and give the verbal order thank you        [facility] -OT verbalized understanding

## 2022-10-28 NOTE — Telephone Encounter (Signed)
Home Health -[name] [name] needing verbal order for once a week for 2 weeks OT. [name] [name]-[phone]

## 2022-10-29 ENCOUNTER — Encounter: Payer: Self-pay | Admitting: Family Medicine

## 2022-10-29 ENCOUNTER — Telehealth: Payer: Self-pay

## 2022-10-29 ENCOUNTER — Telehealth: Payer: Self-pay | Admitting: Family Medicine

## 2022-10-29 DIAGNOSIS — K269 Duodenal ulcer, unspecified as acute or chronic, without hemorrhage or perforation: Secondary | ICD-10-CM

## 2022-10-29 DIAGNOSIS — Z471 Aftercare following joint replacement surgery: Secondary | ICD-10-CM | POA: Diagnosis not present

## 2022-10-29 DIAGNOSIS — K264 Chronic or unspecified duodenal ulcer with hemorrhage: Secondary | ICD-10-CM

## 2022-10-29 DIAGNOSIS — E785 Hyperlipidemia, unspecified: Secondary | ICD-10-CM | POA: Diagnosis not present

## 2022-10-29 DIAGNOSIS — Z96643 Presence of artificial hip joint, bilateral: Secondary | ICD-10-CM | POA: Diagnosis not present

## 2022-10-29 DIAGNOSIS — F32A Depression, unspecified: Secondary | ICD-10-CM | POA: Diagnosis not present

## 2022-10-29 DIAGNOSIS — D62 Acute posthemorrhagic anemia: Secondary | ICD-10-CM | POA: Diagnosis not present

## 2022-10-29 DIAGNOSIS — Z8719 Personal history of other diseases of the digestive system: Secondary | ICD-10-CM | POA: Diagnosis not present

## 2022-10-29 DIAGNOSIS — I509 Heart failure, unspecified: Secondary | ICD-10-CM

## 2022-10-29 DIAGNOSIS — I252 Old myocardial infarction: Secondary | ICD-10-CM | POA: Diagnosis not present

## 2022-10-29 DIAGNOSIS — K922 Gastrointestinal hemorrhage, unspecified: Secondary | ICD-10-CM | POA: Diagnosis not present

## 2022-10-29 DIAGNOSIS — Z7982 Long term (current) use of aspirin: Secondary | ICD-10-CM | POA: Diagnosis not present

## 2022-10-29 DIAGNOSIS — G4733 Obstructive sleep apnea (adult) (pediatric): Secondary | ICD-10-CM | POA: Diagnosis not present

## 2022-10-29 DIAGNOSIS — R9430 Abnormal result of cardiovascular function study, unspecified: Secondary | ICD-10-CM

## 2022-10-29 DIAGNOSIS — I5022 Chronic systolic (congestive) heart failure: Secondary | ICD-10-CM | POA: Diagnosis not present

## 2022-10-29 DIAGNOSIS — Z791 Long term (current) use of non-steroidal anti-inflammatories (NSAID): Secondary | ICD-10-CM | POA: Diagnosis not present

## 2022-10-29 DIAGNOSIS — I110 Hypertensive heart disease with heart failure: Secondary | ICD-10-CM | POA: Diagnosis not present

## 2022-10-29 NOTE — Telephone Encounter (Signed)
Order for echo placed per providers request. Message sent to scheduling team to schedule pt for echo/f-u visit.

## 2022-10-29 NOTE — Telephone Encounter (Signed)
-----   Message from Charlton Haws sent at 10/28/2022  3:16 PM EDT ----- Lukisha/Ashely   can you order an echo on him for CHF and have him see PA up in [place] after echo He has seen Lenze in past ----- Message ----- From: Babs Sciara, MD Sent: 10/28/2022   2:14 PM EDT To: Wendall Stade, MD  Hi Peter-this patient was [facility] with severe GI bleed hemoglobin dropped to 8.  While there they did a echo which showed ejection fraction approximately 35% where previous echo showed 55-60.  My question he is now doing better his hemoglobin is back up to normal would not be wise to do a repeat echo to see if it reduce ejection fraction was partly related to the severity of his health issue/blood loss?  If so I will repeat the echo and if it is still low we will make sure we refer him back to you.  Thank you for your input-Scott

## 2022-10-29 NOTE — Telephone Encounter (Signed)
Nurses Please put in a couple referrals Referral #1-gastroenterology-Dr. [facility]-diagnosis GI bleed, duodenal ulcer  Referral #2-Dr.Nishan-diagnosis CHF They will be setting him up for echo and office visit in the [place] office with the PA  Patient is aware of the referrals I sent him a MyChart message regarding this

## 2022-10-29 NOTE — Telephone Encounter (Signed)
Referrals ordered in [facility]

## 2022-11-03 ENCOUNTER — Ambulatory Visit: Payer: Medicare Other | Admitting: Internal Medicine

## 2022-11-03 DIAGNOSIS — K922 Gastrointestinal hemorrhage, unspecified: Secondary | ICD-10-CM | POA: Diagnosis not present

## 2022-11-03 DIAGNOSIS — E785 Hyperlipidemia, unspecified: Secondary | ICD-10-CM | POA: Diagnosis not present

## 2022-11-03 DIAGNOSIS — I252 Old myocardial infarction: Secondary | ICD-10-CM | POA: Diagnosis not present

## 2022-11-03 DIAGNOSIS — G4733 Obstructive sleep apnea (adult) (pediatric): Secondary | ICD-10-CM | POA: Diagnosis not present

## 2022-11-03 DIAGNOSIS — D62 Acute posthemorrhagic anemia: Secondary | ICD-10-CM | POA: Diagnosis not present

## 2022-11-03 DIAGNOSIS — Z791 Long term (current) use of non-steroidal anti-inflammatories (NSAID): Secondary | ICD-10-CM | POA: Diagnosis not present

## 2022-11-03 DIAGNOSIS — F32A Depression, unspecified: Secondary | ICD-10-CM | POA: Diagnosis not present

## 2022-11-03 DIAGNOSIS — Z96643 Presence of artificial hip joint, bilateral: Secondary | ICD-10-CM | POA: Diagnosis not present

## 2022-11-03 DIAGNOSIS — Z471 Aftercare following joint replacement surgery: Secondary | ICD-10-CM | POA: Diagnosis not present

## 2022-11-03 DIAGNOSIS — Z7982 Long term (current) use of aspirin: Secondary | ICD-10-CM | POA: Diagnosis not present

## 2022-11-03 DIAGNOSIS — I110 Hypertensive heart disease with heart failure: Secondary | ICD-10-CM | POA: Diagnosis not present

## 2022-11-03 DIAGNOSIS — Z8719 Personal history of other diseases of the digestive system: Secondary | ICD-10-CM | POA: Diagnosis not present

## 2022-11-03 DIAGNOSIS — I5022 Chronic systolic (congestive) heart failure: Secondary | ICD-10-CM | POA: Diagnosis not present

## 2022-11-04 ENCOUNTER — Encounter: Payer: Self-pay | Admitting: Internal Medicine

## 2022-11-04 ENCOUNTER — Ambulatory Visit (INDEPENDENT_AMBULATORY_CARE_PROVIDER_SITE_OTHER): Payer: Medicare Other | Admitting: Internal Medicine

## 2022-11-04 VITALS — BP 137/64 | HR 94 | Temp 98.7°F | Ht 68.0 in | Wt 192.4 lb

## 2022-11-04 DIAGNOSIS — I5022 Chronic systolic (congestive) heart failure: Secondary | ICD-10-CM | POA: Diagnosis not present

## 2022-11-04 DIAGNOSIS — D500 Iron deficiency anemia secondary to blood loss (chronic): Secondary | ICD-10-CM

## 2022-11-04 DIAGNOSIS — F32A Depression, unspecified: Secondary | ICD-10-CM | POA: Diagnosis not present

## 2022-11-04 DIAGNOSIS — Z8719 Personal history of other diseases of the digestive system: Secondary | ICD-10-CM | POA: Diagnosis not present

## 2022-11-04 DIAGNOSIS — E785 Hyperlipidemia, unspecified: Secondary | ICD-10-CM | POA: Diagnosis not present

## 2022-11-04 DIAGNOSIS — I252 Old myocardial infarction: Secondary | ICD-10-CM | POA: Diagnosis not present

## 2022-11-04 DIAGNOSIS — Z471 Aftercare following joint replacement surgery: Secondary | ICD-10-CM | POA: Diagnosis not present

## 2022-11-04 DIAGNOSIS — Z1211 Encounter for screening for malignant neoplasm of colon: Secondary | ICD-10-CM

## 2022-11-04 DIAGNOSIS — K274 Chronic or unspecified peptic ulcer, site unspecified, with hemorrhage: Secondary | ICD-10-CM

## 2022-11-04 DIAGNOSIS — D62 Acute posthemorrhagic anemia: Secondary | ICD-10-CM | POA: Diagnosis not present

## 2022-11-04 DIAGNOSIS — I110 Hypertensive heart disease with heart failure: Secondary | ICD-10-CM | POA: Diagnosis not present

## 2022-11-04 DIAGNOSIS — Z7982 Long term (current) use of aspirin: Secondary | ICD-10-CM | POA: Diagnosis not present

## 2022-11-04 DIAGNOSIS — K922 Gastrointestinal hemorrhage, unspecified: Secondary | ICD-10-CM | POA: Diagnosis not present

## 2022-11-04 DIAGNOSIS — Z791 Long term (current) use of non-steroidal anti-inflammatories (NSAID): Secondary | ICD-10-CM | POA: Diagnosis not present

## 2022-11-04 DIAGNOSIS — Z96643 Presence of artificial hip joint, bilateral: Secondary | ICD-10-CM | POA: Diagnosis not present

## 2022-11-04 DIAGNOSIS — G4733 Obstructive sleep apnea (adult) (pediatric): Secondary | ICD-10-CM | POA: Diagnosis not present

## 2022-11-04 MED ORDER — PANTOPRAZOLE SODIUM 40 MG PO TBEC: 40.0000 mg | DELAYED_RELEASE_TABLET | Freq: Two times a day (BID) | ORAL | 11 refills | Status: DC

## 2022-11-04 NOTE — Progress Notes (Signed)
Primary Care Physician:  Babs Sciara, MD Primary Gastroenterologist:  Dr. Marletta Lor  Chief Complaint  Patient presents with   Hospitalization Follow-up    Was in hospital at [facility] where they found an ulcer and GI bleed back on May 21    HPI:   [name] [name] is a 72 y.o. male who presents to clinic today by referral from his PCP Dr. Gerda Diss for evaluation.  He has a history of hypertension, sleep apnea, prostate cancer.  Admitted to [facility] 09/25/2022 after initially presenting with maroon-colored stools, acute blood loss anemia.  Initial hemoglobin 4.7 responded to PRBCs.  Recent hip arthroplasty 09/10/2022, discharged on aspirin prophylaxis.  Also history of taking Mobic.  EGD 09/27/2022, normal esophagus, normal stomach, 3 small superficial benign-appearing ulcers in the duodenal bulb, clean-based.  Gastric biopsies negative for H. pylori.  H. pylori stool negative.  Placed on twice daily PPI and discharged home.  Recent hemoglobin 10/26/22 was improved at 11.8.  Cardiology was consulted for elevated troponin, peaked at 1342. Echo with newly reduced EF to 45% and new wall motion abnormality. Plan for [facility] for ischemic evaluation but wanted to ensure patient was able to tolerate DAPT from a bleeding standpoint. Initiated on metoprolol, losartan and spironolactone for GDMT. Recommend titration as BP, HR, and renal function allow.  Patient has repeat echocardiogram 12/06/2022 cardiology follow-up arranged 12/13/22.  Today, patient states he is doing well.  Denies any melena hematochezia.  Appetite is good.  Past Medical History:  Diagnosis Date   Aortic atherosclerosis (HCC) 10/19/2016    Seen on chest x-ray July 2018   Arthritis    right knee and hip    Cancer [facility])    prostate cancer    Chronic kidney disease    prostate cancer    Diabetes mellitus without complication (HCC)    GERD (gastroesophageal reflux disease)    Gout    Gout    Hypercholesteremia     Hyperglycemia    Hyperlipidemia    Hypertension    Sleep apnea    Status post THR (total hip replacement) 04/20/13 05/01/2013   Right total hip 04/20/2013 Dr. Randol Kern implant     Past Surgical History:  Procedure Laterality Date   COLONOSCOPY     COLONOSCOPY N/A 10/22/2013   Procedure: COLONOSCOPY;  Surgeon: West Bali, MD;  [facility];  Service: Endoscopy;  Laterality: N/A;  1115   HERNIA REPAIR     right inguinal hernia repair    LUMBAR LAMINECTOMY/DECOMPRESSION MICRODISCECTOMY Right 03/16/2021   Procedure: Right Lumbar four-five Laminectomy/foraminotomy;  Surgeon: Tia Alert, MD;  Location: [facility] OR;  Service: Neurosurgery;  Laterality: Right;   NASAL SEPTUM SURGERY     OTHER SURGICAL HISTORY     deviated septum surgery    ROBOT ASSISTED LAPAROSCOPIC RADICAL PROSTATECTOMY  04/20/2011   Procedure: ROBOTIC ASSISTED LAPAROSCOPIC RADICAL PROSTATECTOMY;  Surgeon: Anner Crete, MD;  Location: [facility] ORS;  Service: Urology;  Laterality: N/A;  Bilateral lymph node dissection   TOTAL HIP ARTHROPLASTY Right 04/20/2013   Procedure: TOTAL HIP ARTHROPLASTY;  Surgeon: Vickki Hearing, MD;  Location: [facility] ORS;  Service: Orthopedics;  Laterality: Right;   TOTAL KNEE ARTHROPLASTY Right 05/16/2018   Procedure: TOTAL KNEE ARTHROPLASTY;  Surgeon: Vickki Hearing, MD;  Location: [facility] ORS;  Service: Orthopedics;  Laterality: Right;    Current Outpatient Medications  Medication Sig Dispense Refill   allopurinol (ZYLOPRIM) 100 MG tablet TAKE 1 TABLET  BY MOUTH DAILY 100 tablet 2   amLODipine (NORVASC) 5 MG tablet TAKE 1 TABLET BY MOUTH DAILY 100 tablet 2   aspirin 81 MG chewable tablet Chew 81 mg by mouth daily.      B Complex-C (SUPER B COMPLEX PO) Take 1 tablet by mouth daily.     buPROPion (WELLBUTRIN XL) 150 MG 24 hr tablet TAKE 1 TABLET BY MOUTH DAILY .  STOP CITALOPRAM. 90 tablet 1   Cholecalciferol 125 MCG (5000 UT) TABS Take by mouth.     ferrous sulfate 325 (65 FE) MG tablet  Take by mouth.     losartan (COZAAR) 25 MG tablet Take 25 mg by mouth daily.     meloxicam (MOBIC) 7.5 MG tablet Take 7.5 mg by mouth daily.     metoprolol succinate (TOPROL-XL) 25 MG 24 hr tablet Take 12.5 mg by mouth daily.     Multiple Vitamin (MULTIVITAMIN) tablet Take 1 tablet by mouth daily.     pantoprazole (PROTONIX) 40 MG tablet Take 1 tablet (40 mg total) by mouth 2 (two) times daily. 60 tablet 1   potassium chloride (KLOR-CON M) 10 MEQ tablet TAKE 1 TABLET BY MOUTH DAILY 100 tablet 2   rosuvastatin (CRESTOR) 10 MG tablet Take 10 mg by mouth daily.     Vibegron (GEMTESA) 75 MG TABS Take 1 tablet (75 mg total) by mouth daily. 30 tablet 11   No current facility-administered medications for this visit.    Allergies as of 11/04/2022 - Review Complete 11/04/2022  Allergen Reaction Noted   Citalopram  11/01/2019   Lisinopril Cough 07/19/2018    Family History  Problem Relation Age of Onset   Diabetes Mother    Hypertension Mother    Hypertension Brother     Social History   Socioeconomic History   Marital status: Single    Spouse name: Not on file   Number of children: Not on file   Years of education: Not on file   Highest education level: Not on file  Occupational History   Not on file  Tobacco Use   Smoking status: Former    Current packs/day: 0.00    Average packs/day: 0.5 packs/day for 8.0 years (4.0 ttl pk-yrs)    Types: Cigarettes    Start date: 03/22/1976    Quit date: 03/22/1984    Years since quitting: 38.6   Smokeless tobacco: Never  Vaping Use   Vaping status: Never Used  Substance and Sexual Activity   Alcohol use: Yes    Alcohol/week: 1.0 standard drink of alcohol    Types: 1 Cans of beer per week    Comment: occasional    Drug use: No   Sexual activity: Yes    Birth control/protection: None  Other Topics Concern   Not on file  Social History Narrative   Not on file   Social Determinants of Health   Financial Resource Strain: Low Risk   (12/24/2021)   Overall Financial Resource Strain (CARDIA)    Difficulty of Paying Living Expenses: Not hard at all  Food Insecurity: Low Risk  (09/25/2022)   Received from [facility]   Food vital sign    Within the past 12 months, you worried that your food would run out before you got money to buy more: Never true    Within the past 12 months, the food you bought just didn't last and you didn't have money to get more. : Never true  Transportation Needs: Not on file (09/25/2022)  Physical Activity: Insufficiently Active (12/24/2021)   Exercise Vital Sign    Days of Exercise per Week: 4 days    Minutes of Exercise per Session: 20 min  Stress: No Stress Concern Present (12/24/2021)   [employer] of Occupational Health - Occupational Stress Questionnaire    Feeling of Stress : Not at all  Social Connections: Moderately Integrated (12/24/2021)   Social Connection and Isolation Panel [NHANES]    Frequency of Communication with Friends and Family: Three times a week    Frequency of Social Gatherings with Friends and Family: Once a week    Attends Religious Services: More than 4 times per year    Active Member of [employer] or Organizations: No    Attends Banker Meetings: Never    Marital Status: Living with partner  Intimate Partner Violence: Not At Risk (12/24/2021)   Humiliation, Afraid, Rape, and Kick questionnaire    Fear of Current or Ex-Partner: No    Emotionally Abused: No    Physically Abused: No    Sexually Abused: No    Subjective: Review of Systems  Constitutional:  Negative for chills and fever.  HENT:  Negative for congestion and hearing loss.   Eyes:  Negative for blurred vision and double vision.  Respiratory:  Negative for cough and shortness of breath.   Cardiovascular:  Negative for chest pain and palpitations.  Gastrointestinal:  Negative for abdominal pain, blood in stool, constipation, diarrhea, heartburn, melena and vomiting.  Genitourinary:  Negative  for dysuria and urgency.  Musculoskeletal:  Negative for joint pain and myalgias.  Skin:  Negative for itching and rash.  Neurological:  Negative for dizziness and headaches.  Psychiatric/Behavioral:  Negative for depression. The patient is not nervous/anxious.        Objective: BP 137/64 (BP Location: Right Arm, Patient Position: Sitting, Cuff Size: Large)   Pulse 94   Temp 98.7 F (37.1 C) (Oral)   Ht 5\' 8"  (1.727 m)   Wt 192 lb 6.4 oz (87.3 kg)   SpO2 97%   BMI 29.25 kg/m  Physical Exam Constitutional:      Appearance: Normal appearance.  HENT:     Head: Normocephalic and atraumatic.  Eyes:     Extraocular Movements: Extraocular movements intact.     Conjunctiva/sclera: Conjunctivae normal.  Cardiovascular:     Rate and Rhythm: Normal rate and regular rhythm.  Pulmonary:     Effort: Pulmonary effort is normal.     Breath sounds: Normal breath sounds.  Abdominal:     General: Bowel sounds are normal.     Palpations: Abdomen is soft.  Musculoskeletal:        General: Normal range of motion.     Cervical back: Normal range of motion and neck supple.  Skin:    General: Skin is warm.  Neurological:     General: No focal deficit present.     Mental Status: He is alert and oriented to person, place, and time.  Psychiatric:        Mood and Affect: Mood normal.        Behavior: Behavior normal.      Assessment: *Peptic ulcer disease with hemorrhage *Iron deficiency anemia due to above-improving *Colon cancer screening  Plan: Patient appears to be doing very well in office today.  Hemoglobin improved.  Ulcers likely NSAID induced in the setting of aspirin and previous Mobic use.  Recommend twice daily pantoprazole for 8 more weeks at which point he can decrease down  to once daily.  Limit NSAID use.  Continue oral iron, monitor hemoglobin.  Patient has follow-up arranged with cardiology, okay from GI standpoint to start on DAPT if need be.  Colonoscopy recall  2025 for colon cancer screening purposes.  Follow-up in 3 months.  Thank you Dr. Gerda Diss for the kind referral  11/04/2022 1:22 PM   Disclaimer: This note was dictated with voice recognition software. Similar sounding words can inadvertently be transcribed and may not be corrected upon review.

## 2022-11-04 NOTE — Patient Instructions (Addendum)
I am happy to hear that you are doing better.  Continue on pantoprazole twice daily for at least 8 more weeks.  At that point you can likely go down to once a day.  Limit NSAID use as best as you can.  We will tentatively plan on colonoscopy next year for colon cancer screening purposes.  Continue oral iron.  Follow-up in 3 months.  It was very nice meeting you today.  Dr. Marletta Lor

## 2022-11-05 DIAGNOSIS — G4733 Obstructive sleep apnea (adult) (pediatric): Secondary | ICD-10-CM | POA: Diagnosis not present

## 2022-11-05 DIAGNOSIS — Z8719 Personal history of other diseases of the digestive system: Secondary | ICD-10-CM | POA: Diagnosis not present

## 2022-11-05 DIAGNOSIS — Z96643 Presence of artificial hip joint, bilateral: Secondary | ICD-10-CM | POA: Diagnosis not present

## 2022-11-05 DIAGNOSIS — I110 Hypertensive heart disease with heart failure: Secondary | ICD-10-CM | POA: Diagnosis not present

## 2022-11-05 DIAGNOSIS — I5022 Chronic systolic (congestive) heart failure: Secondary | ICD-10-CM | POA: Diagnosis not present

## 2022-11-05 DIAGNOSIS — Z471 Aftercare following joint replacement surgery: Secondary | ICD-10-CM | POA: Diagnosis not present

## 2022-11-05 DIAGNOSIS — K922 Gastrointestinal hemorrhage, unspecified: Secondary | ICD-10-CM | POA: Diagnosis not present

## 2022-11-05 DIAGNOSIS — Z7982 Long term (current) use of aspirin: Secondary | ICD-10-CM | POA: Diagnosis not present

## 2022-11-05 DIAGNOSIS — E785 Hyperlipidemia, unspecified: Secondary | ICD-10-CM | POA: Diagnosis not present

## 2022-11-05 DIAGNOSIS — D62 Acute posthemorrhagic anemia: Secondary | ICD-10-CM | POA: Diagnosis not present

## 2022-11-05 DIAGNOSIS — Z791 Long term (current) use of non-steroidal anti-inflammatories (NSAID): Secondary | ICD-10-CM | POA: Diagnosis not present

## 2022-11-05 DIAGNOSIS — I252 Old myocardial infarction: Secondary | ICD-10-CM | POA: Diagnosis not present

## 2022-11-05 DIAGNOSIS — F32A Depression, unspecified: Secondary | ICD-10-CM

## 2022-11-05 NOTE — Telephone Encounter (Signed)
Pt is aware of OV with Marletta Lor

## 2022-11-09 ENCOUNTER — Other Ambulatory Visit: Payer: Self-pay | Admitting: Family Medicine

## 2022-11-11 ENCOUNTER — Ambulatory Visit (INDEPENDENT_AMBULATORY_CARE_PROVIDER_SITE_OTHER): Payer: Self-pay | Admitting: Internal Medicine

## 2022-11-11 VITALS — BP 132/72 | HR 75 | Temp 97.8°F | Ht 67.0 in | Wt 192.2 lb

## 2022-11-11 DIAGNOSIS — E785 Hyperlipidemia, unspecified: Secondary | ICD-10-CM | POA: Diagnosis not present

## 2022-11-11 DIAGNOSIS — I252 Old myocardial infarction: Secondary | ICD-10-CM | POA: Diagnosis not present

## 2022-11-11 DIAGNOSIS — K922 Gastrointestinal hemorrhage, unspecified: Secondary | ICD-10-CM | POA: Diagnosis not present

## 2022-11-11 DIAGNOSIS — Z96643 Presence of artificial hip joint, bilateral: Secondary | ICD-10-CM | POA: Diagnosis not present

## 2022-11-11 DIAGNOSIS — D62 Acute posthemorrhagic anemia: Secondary | ICD-10-CM | POA: Diagnosis not present

## 2022-11-11 DIAGNOSIS — I110 Hypertensive heart disease with heart failure: Secondary | ICD-10-CM | POA: Diagnosis not present

## 2022-11-11 DIAGNOSIS — Z791 Long term (current) use of non-steroidal anti-inflammatories (NSAID): Secondary | ICD-10-CM | POA: Diagnosis not present

## 2022-11-11 DIAGNOSIS — Z8719 Personal history of other diseases of the digestive system: Secondary | ICD-10-CM | POA: Diagnosis not present

## 2022-11-11 DIAGNOSIS — Z471 Aftercare following joint replacement surgery: Secondary | ICD-10-CM | POA: Diagnosis not present

## 2022-11-11 DIAGNOSIS — I5022 Chronic systolic (congestive) heart failure: Secondary | ICD-10-CM | POA: Diagnosis not present

## 2022-11-11 DIAGNOSIS — G4733 Obstructive sleep apnea (adult) (pediatric): Secondary | ICD-10-CM | POA: Diagnosis not present

## 2022-11-11 DIAGNOSIS — K274 Chronic or unspecified peptic ulcer, site unspecified, with hemorrhage: Secondary | ICD-10-CM

## 2022-11-11 DIAGNOSIS — Z7982 Long term (current) use of aspirin: Secondary | ICD-10-CM | POA: Diagnosis not present

## 2022-11-11 NOTE — Progress Notes (Signed)
Patient presented for clinic visit today, I saw him last week in the office.  Doing well.  Does not need to be evaluated by GI today.  No charge for this visit.  Follow-up with me in 2 to 3 months.  Continue current medications.

## 2022-11-11 NOTE — Patient Instructions (Signed)
We will not charge you for this visit today.  Continue on pantoprazole twice daily for 7 more weeks at which point you can decrease down to once daily.  Follow-up with me in 2 to 3 months.

## 2022-11-16 DIAGNOSIS — D62 Acute posthemorrhagic anemia: Secondary | ICD-10-CM | POA: Diagnosis not present

## 2022-11-16 DIAGNOSIS — Z8719 Personal history of other diseases of the digestive system: Secondary | ICD-10-CM | POA: Diagnosis not present

## 2022-11-16 DIAGNOSIS — K922 Gastrointestinal hemorrhage, unspecified: Secondary | ICD-10-CM | POA: Diagnosis not present

## 2022-11-16 DIAGNOSIS — I110 Hypertensive heart disease with heart failure: Secondary | ICD-10-CM | POA: Diagnosis not present

## 2022-11-16 DIAGNOSIS — I252 Old myocardial infarction: Secondary | ICD-10-CM | POA: Diagnosis not present

## 2022-11-16 DIAGNOSIS — Z791 Long term (current) use of non-steroidal anti-inflammatories (NSAID): Secondary | ICD-10-CM | POA: Diagnosis not present

## 2022-11-16 DIAGNOSIS — E785 Hyperlipidemia, unspecified: Secondary | ICD-10-CM | POA: Diagnosis not present

## 2022-11-16 DIAGNOSIS — Z7982 Long term (current) use of aspirin: Secondary | ICD-10-CM | POA: Diagnosis not present

## 2022-11-16 DIAGNOSIS — I5022 Chronic systolic (congestive) heart failure: Secondary | ICD-10-CM | POA: Diagnosis not present

## 2022-11-16 DIAGNOSIS — Z96643 Presence of artificial hip joint, bilateral: Secondary | ICD-10-CM | POA: Diagnosis not present

## 2022-11-16 DIAGNOSIS — Z471 Aftercare following joint replacement surgery: Secondary | ICD-10-CM | POA: Diagnosis not present

## 2022-11-16 DIAGNOSIS — G4733 Obstructive sleep apnea (adult) (pediatric): Secondary | ICD-10-CM | POA: Diagnosis not present

## 2022-11-19 ENCOUNTER — Other Ambulatory Visit: Payer: Self-pay | Admitting: Family Medicine

## 2022-11-25 ENCOUNTER — Ambulatory Visit (INDEPENDENT_AMBULATORY_CARE_PROVIDER_SITE_OTHER): Payer: Medicare Other | Admitting: Urology

## 2022-11-25 VITALS — BP 152/74 | HR 76 | Ht 67.0 in | Wt 192.0 lb

## 2022-11-25 DIAGNOSIS — E291 Testicular hypofunction: Secondary | ICD-10-CM | POA: Diagnosis not present

## 2022-11-25 DIAGNOSIS — Z96643 Presence of artificial hip joint, bilateral: Secondary | ICD-10-CM | POA: Diagnosis not present

## 2022-11-25 DIAGNOSIS — Z8546 Personal history of malignant neoplasm of prostate: Secondary | ICD-10-CM

## 2022-11-25 DIAGNOSIS — I110 Hypertensive heart disease with heart failure: Secondary | ICD-10-CM | POA: Diagnosis not present

## 2022-11-25 DIAGNOSIS — Z7982 Long term (current) use of aspirin: Secondary | ICD-10-CM | POA: Diagnosis not present

## 2022-11-25 DIAGNOSIS — N3941 Urge incontinence: Secondary | ICD-10-CM | POA: Diagnosis not present

## 2022-11-25 DIAGNOSIS — G4733 Obstructive sleep apnea (adult) (pediatric): Secondary | ICD-10-CM | POA: Diagnosis not present

## 2022-11-25 DIAGNOSIS — N5201 Erectile dysfunction due to arterial insufficiency: Secondary | ICD-10-CM

## 2022-11-25 DIAGNOSIS — I5022 Chronic systolic (congestive) heart failure: Secondary | ICD-10-CM | POA: Diagnosis not present

## 2022-11-25 DIAGNOSIS — K922 Gastrointestinal hemorrhage, unspecified: Secondary | ICD-10-CM | POA: Diagnosis not present

## 2022-11-25 DIAGNOSIS — Z471 Aftercare following joint replacement surgery: Secondary | ICD-10-CM | POA: Diagnosis not present

## 2022-11-25 DIAGNOSIS — Z8719 Personal history of other diseases of the digestive system: Secondary | ICD-10-CM | POA: Diagnosis not present

## 2022-11-25 DIAGNOSIS — Z791 Long term (current) use of non-steroidal anti-inflammatories (NSAID): Secondary | ICD-10-CM | POA: Diagnosis not present

## 2022-11-25 DIAGNOSIS — D62 Acute posthemorrhagic anemia: Secondary | ICD-10-CM | POA: Diagnosis not present

## 2022-11-25 DIAGNOSIS — I252 Old myocardial infarction: Secondary | ICD-10-CM | POA: Diagnosis not present

## 2022-11-25 DIAGNOSIS — E785 Hyperlipidemia, unspecified: Secondary | ICD-10-CM | POA: Diagnosis not present

## 2022-11-25 LAB — URINALYSIS, ROUTINE W REFLEX MICROSCOPIC
Bilirubin, UA: NEGATIVE
Glucose, UA: NEGATIVE
Ketones, UA: NEGATIVE
Leukocytes,UA: NEGATIVE
Nitrite, UA: NEGATIVE
Protein,UA: NEGATIVE
Specific Gravity, UA: 1.025 (ref 1.005–1.030)
Urobilinogen, Ur: 0.2 mg/dL (ref 0.2–1.0)
pH, UA: 5.5 (ref 5.0–7.5)

## 2022-11-25 LAB — MICROSCOPIC EXAMINATION
Bacteria, UA: NONE SEEN
WBC, UA: NONE SEEN /HPF (ref 0–5)

## 2022-11-25 MED ORDER — TESTOSTERONE POWD
5 refills | Status: DC
Start: 2022-11-25 — End: 2023-09-01

## 2022-11-25 NOTE — Progress Notes (Signed)
Subjective:  1. Hypogonadism male   2. History of prostate cancer   3. Erectile dysfunction due to arterial insufficiency   4. Urge incontinence       [name] [name] returns today in f/u for his history of hypogonadism and prostate cancer. He had a left THA on 05/06/22 and a right THA in 6/24 at [facility] and is still rehabing and is doing well.   He is s/p a RALP on 04/20/11. His PSA was <0.1 on 05/20/22. He had Gleason 7(3+4) T2c N0 disease.   He has MUI and had some increased frequency on HCTZ but is still having the frequency and nocturia after stopping it.   He rarely wear pads.  He has no SUI but continues to have UUI but that is rare if he is careful and has noted that once he gets the urge he can only hold it q15-20 min.  He is not on the [place] because of cost but did get some lasting benefit after an initial course.  He has no hematuria or dysuria. He has a good stream. His IPSS is 7.  UA has trace blood but is otherwise clear today.  He had a negative hematuria w/u in 9/23.   He has had some erectile dysfunction that he manages with sildenafil 20mg  but he hasn't been active with his orthopedic issues.   He remains on compounded testosterone 4% 3 clicks daily but he is only using it intermittently with intermittent use in the last 2 weeks.   He is doing well with good energy level.   He didn't get labs yet.     IPSS     Row Name 11/25/22 1000         International Prostate Symptom Score   How often have you had the sensation of not emptying your bladder? Less than 1 in 5     How often have you had to urinate less than every two hours? Less than 1 in 5 times     How often have you found you stopped and started again several times when you urinated? Not at All     How often have you found it difficult to postpone urination? Less than half the time     How often have you had a weak urinary stream? Not at All     How often have you had to strain to start urination? Not at All     How many  times did you typically get up at night to urinate? 3 Times     Total IPSS Score 7       Quality of Life due to urinary symptoms   If you were to spend the rest of your life with your urinary condition just the way it is now how would you feel about that? Pleased                         ROS:  ROS:  A complete review of systems was performed.  All systems are negative except for pertinent findings as noted.   Review of Systems  Musculoskeletal:  Positive for joint pain.  Neurological:  Positive for dizziness.  Psychiatric/Behavioral:  Positive for depression. The patient is nervous/anxious.   All other systems reviewed and are negative.   Allergies  Allergen Reactions   Citalopram     Fatigue headache    Lisinopril Cough    Dry cough    Outpatient Encounter Medications as of  11/25/2022  Medication Sig   allopurinol (ZYLOPRIM) 100 MG tablet TAKE 1 TABLET BY MOUTH DAILY   amLODipine (NORVASC) 5 MG tablet TAKE 1 TABLET BY MOUTH DAILY   aspirin 81 MG chewable tablet Chew 81 mg by mouth daily.    B Complex-C (SUPER B COMPLEX PO) Take 1 tablet by mouth daily.   buPROPion (WELLBUTRIN XL) 150 MG 24 hr tablet TAKE 1 TABLET BY MOUTH DAILY  STOP CITALOPRAM   Cholecalciferol 125 MCG (5000 UT) TABS Take by mouth.   ferrous sulfate 325 (65 FE) MG tablet Take by mouth.   losartan (COZAAR) 25 MG tablet Take 25 mg by mouth daily.   meloxicam (MOBIC) 7.5 MG tablet Take 7.5 mg by mouth daily.   metoprolol succinate (TOPROL-XL) 25 MG 24 hr tablet Take 12.5 mg by mouth daily.   Multiple Vitamin (MULTIVITAMIN) tablet Take 1 tablet by mouth daily.   pantoprazole (PROTONIX) 40 MG tablet Take 1 tablet (40 mg total) by mouth 2 (two) times daily.   potassium chloride (KLOR-CON M) 10 MEQ tablet TAKE 1 TABLET BY MOUTH DAILY   rosuvastatin (CRESTOR) 10 MG tablet Take 10 mg by mouth daily.   Testosterone POWD Testosterone 4% cream, 3 clicks (0.63ml) topically qam   [DISCONTINUED] Vibegron  (GEMTESA) 75 MG TABS Take 1 tablet (75 mg total) by mouth daily.   No facility-administered encounter medications on file as of 11/25/2022.    Past Medical History:  Diagnosis Date   Aortic atherosclerosis (HCC) 10/19/2016    Seen on chest x-ray July 2018   Arthritis    right knee and hip    Cancer [facility])    prostate cancer    Chronic kidney disease    prostate cancer    Diabetes mellitus without complication (HCC)    GERD (gastroesophageal reflux disease)    Gout    Gout    Hypercholesteremia    Hyperglycemia    Hyperlipidemia    Hypertension    Sleep apnea    Status post THR (total hip replacement) 04/20/13 05/01/2013   Right total hip 04/20/2013 Dr. Randol Kern implant     Past Surgical History:  Procedure Laterality Date   COLONOSCOPY     COLONOSCOPY N/A 10/22/2013   Procedure: COLONOSCOPY;  Surgeon: West Bali, MD;  [facility];  Service: Endoscopy;  Laterality: N/A;  1115   HERNIA REPAIR     right inguinal hernia repair    LUMBAR LAMINECTOMY/DECOMPRESSION MICRODISCECTOMY Right 03/16/2021   Procedure: Right Lumbar four-five Laminectomy/foraminotomy;  Surgeon: Tia Alert, MD;  Location: [facility] OR;  Service: Neurosurgery;  Laterality: Right;   NASAL SEPTUM SURGERY     OTHER SURGICAL HISTORY     deviated septum surgery    ROBOT ASSISTED LAPAROSCOPIC RADICAL PROSTATECTOMY  04/20/2011   Procedure: ROBOTIC ASSISTED LAPAROSCOPIC RADICAL PROSTATECTOMY;  Surgeon: Anner Crete, MD;  Location: [facility]S;  Service: Urology;  Laterality: N/A;  Bilateral lymph node dissection   TOTAL HIP ARTHROPLASTY Right 04/20/2013   Procedure: TOTAL HIP ARTHROPLASTY;  Surgeon: Vickki Hearing, MD;  Location: [facility];  Service: Orthopedics;  Laterality: Right;   TOTAL KNEE ARTHROPLASTY Right 05/16/2018   Procedure: TOTAL KNEE ARTHROPLASTY;  Surgeon: Vickki Hearing, MD;  Location: [facility];  Service: Orthopedics;  Laterality: Right;    Social History   Socioeconomic History    Marital status: Single    Spouse name: Not on file   Number of children: Not on file   Years of  education: Not on file   Highest education level: Not on file  Occupational History   Not on file  Tobacco Use   Smoking status: Former    Current packs/day: 0.00    Average packs/day: 0.5 packs/day for 8.0 years (4.0 ttl pk-yrs)    Types: Cigarettes    Start date: 03/22/1976    Quit date: 03/22/1984    Years since quitting: 38.7   Smokeless tobacco: Never  Vaping Use   Vaping status: Never Used  Substance and Sexual Activity   Alcohol use: Yes    Alcohol/week: 1.0 standard drink of alcohol    Types: 1 Cans of beer per week    Comment: occasional    Drug use: No   Sexual activity: Yes    Birth control/protection: None  Other Topics Concern   Not on file  Social History Narrative   Not on file   Social Determinants of Health   Financial Resource Strain: Low Risk  (12/24/2021)   Overall Financial Resource Strain (CARDIA)    Difficulty of Paying Living Expenses: Not hard at all  Food Insecurity: Low Risk  (09/25/2022)   Received from [facility]   Hunger Vital Sign    Worried About Running Out of Food in the Last Year: Never true    Ran Out of Food in the Last Year: Never true  Transportation Needs: Not on file (09/25/2022)  Physical Activity: Insufficiently Active (12/24/2021)   Exercise Vital Sign    Days of Exercise per Week: 4 days    Minutes of Exercise per Session: 20 min  Stress: No Stress Concern Present (12/24/2021)   [employer] of Occupational Health - Occupational Stress Questionnaire    Feeling of Stress : Not at all  Social Connections: Moderately Integrated (12/24/2021)   Social Connection and Isolation Panel [NHANES]    Frequency of Communication with Friends and Family: Three times a week    Frequency of Social Gatherings with Friends and Family: Once a week    Attends Religious Services: More than 4 times per year    Active Member of [employer] or Organizations:  No    Attends Banker Meetings: Never    Marital Status: Living with partner  Intimate Partner Violence: Not At Risk (12/24/2021)   Humiliation, Afraid, Rape, and Kick questionnaire    Fear of Current or Ex-Partner: No    Emotionally Abused: No    Physically Abused: No    Sexually Abused: No    Family History  Problem Relation Age of Onset   Diabetes Mother    Hypertension Mother    Hypertension Brother        Objective: BP (!) 152/74   Pulse 76   Ht 5\' 7"  (1.702 m)   Wt 192 lb (87.1 kg)   BMI 30.07 kg/m     Physical Exam Vitals reviewed.  Constitutional:      Appearance: Normal appearance.  Neurological:     Mental Status: He is alert.     Lab Results:  Results for orders placed or performed in visit on 11/25/22 (from the past 24 hour(s))  Hemoglobin and hematocrit, blood     Status: Abnormal   Collection Time: 11/25/22 12:53 PM  Result Value Ref Range   Hemoglobin 12.4 (L) 13.0 - 17.7 g/dL   Hematocrit 81.1 91.4 - 51.0 %   Narrative   Performed at:  [address], [place], [place]  [id number] Lab Director: Jolene Schimke MD, Phone:  [phone]  Testosterone     Status: None   Collection Time: 11/25/22 12:53 PM  Result Value Ref Range   Testosterone 265 264 - 916 ng/dL   Narrative   Performed at:  [address] [place] [address] [place], [place]  [id number] Lab Director: Jolene Schimke MD, Phone:  [phone]      UA has 0-2 RBC's.     BMET No results for input(s): "NA", "K", "CL", "CO2", "GLUCOSE", "BUN", "CREATININE", "CALCIUM" in the last 72 hours. PSA PSA  Date Value Ref Range Status  05/09/2019 <0.1 < OR = 4.0 ng/mL Final    Comment:    The total PSA value from this assay system is  standardized against the WHO standard. The test  result will be approximately 20% lower when compared  to the equimolar-standardized total PSA (Beckman  Coulter). Comparison of serial PSA results should be   interpreted with this fact in mind. . This test was performed using the Siemens  chemiluminescent method. Values obtained from  different assay methods cannot be used interchangeably. PSA levels, regardless of value, should not be interpreted as absolute evidence of the presence or absence of disease.    Testosterone  Date Value Ref Range Status  11/25/2022 265 264 - 916 ng/dL Final    Comment:    Adult male reference interval is based on a population of healthy nonobese males (BMI <30) between 24 and 61 years old. Travison, et.al. JCEM [phone]. PMID: 73220254.   05/20/2022 261 (L) 264 - 916 ng/dL Final    Comment:    Adult male reference interval is based on a population of healthy nonobese males (BMI <30) between 66 and 81 years old. Travison, et.al. JCEM [phone]. PMID: 61607371.   10/26/2021 194 (L) 264 - 916 ng/dL Final    Comment:    Adult male reference interval is based on a population of healthy nonobese males (BMI <30) between 72 and 50 years old. Travison, et.al. JCEM [phone]. PMID: 00938182.      UA is clear.  Results for orders placed or performed in visit on 11/25/22 (from the past 24 hour(s))  Hemoglobin and hematocrit, blood     Status: Abnormal   Collection Time: 11/25/22 12:53 PM  Result Value Ref Range   Hemoglobin 12.4 (L) 13.0 - 17.7 g/dL   Hematocrit 99.3 71.6 - 51.0 %   Narrative   Performed at:  [address] [place], [place]  [id number] Lab Director: Jolene Schimke MD, Phone:  [phone]  Testosterone     Status: None   Collection Time: 11/25/22 12:53 PM  Result Value Ref Range   Testosterone 265 264 - 916 ng/dL   Narrative   Performed at:  [address] [place] [address] [place], [place]  [id number] Lab Director: Jolene Schimke MD, Phone:  [phone]         Studies/Results: No results found.    Assessment & Plan: Hypogonadism.  He is doing well on therapy.  Med  refilled with hand written script.  I will get labs in 6 months prior to f/u.  ED.  Sildenafil remains effective but he has not been active.   Hx of prostate cancer.  PSA has remained undetectible.  Urgency with UUI and nocturia.   He is doing ok off meds.  Meds ordered this encounter  Medications   Testosterone POWD    Sig: Testosterone 4% cream, 3 clicks (0.8ml) topically qam    Dispense:  30 g  Refill:  5      Orders Placed This Encounter  Procedures   Microscopic Examination   Urinalysis, Routine w reflex microscopic   Testosterone    Standing Status:   Future    Standing Expiration Date:   11/25/2023   Hemoglobin and hematocrit, blood    Standing Status:   Future    Standing Expiration Date:   11/25/2023   Hemoglobin and hematocrit, blood   PSA    Standing Status:   Future    Standing Expiration Date:   11/25/2023   Testosterone      Return in about 6 months (around 05/25/2023).   CC: Babs Sciara, MD      [name] [name] 11/26/2022 Patient ID: [name] [name], male   DOB: 02/17/1951, 72 y.o.   MRN: [id number]

## 2022-11-26 LAB — TESTOSTERONE: Testosterone: 265 ng/dL (ref 264–916)

## 2022-11-26 LAB — HEMOGLOBIN AND HEMATOCRIT, BLOOD
Hematocrit: 40 % (ref 37.5–51.0)
Hemoglobin: 12.4 g/dL — ABNORMAL LOW (ref 13.0–17.7)

## 2022-11-29 ENCOUNTER — Ambulatory Visit (INDEPENDENT_AMBULATORY_CARE_PROVIDER_SITE_OTHER): Payer: Medicare Other | Admitting: Family Medicine

## 2022-11-29 VITALS — BP 126/74 | HR 80 | Temp 98.2°F | Ht 67.0 in | Wt 194.6 lb

## 2022-11-29 DIAGNOSIS — E785 Hyperlipidemia, unspecified: Secondary | ICD-10-CM

## 2022-11-29 DIAGNOSIS — I10 Essential (primary) hypertension: Secondary | ICD-10-CM | POA: Diagnosis not present

## 2022-11-29 DIAGNOSIS — Z23 Encounter for immunization: Secondary | ICD-10-CM | POA: Diagnosis not present

## 2022-11-29 DIAGNOSIS — K269 Duodenal ulcer, unspecified as acute or chronic, without hemorrhage or perforation: Secondary | ICD-10-CM

## 2022-11-29 MED ORDER — ATORVASTATIN CALCIUM 40 MG PO TABS: 40.0000 mg | ORAL_TABLET | Freq: Every day | ORAL | 3 refills | Status: DC

## 2022-11-29 NOTE — Progress Notes (Signed)
   Subjective:    Patient ID: [name] [name], male    DOB: [birth year], 72 y.o.   MRN: [id number]  HPI Pt is here today for a 4 week follow up visit, pt would like to discuss a couple of his meds and when to take them. Patient with mild fatigue tiredness He is gaining strength Heme-onc hemoglobin is coming up He denies any rectal bleeding Activity level fair but limited because of his knee using a walker.  Blood pressure elevated when he came in but blood pressure was good after resting  Review of Systems     Objective:   Physical Exam General-in no acute distress Eyes-no discharge Lungs-respiratory rate normal, CTA CV-no murmurs,RRR Extremities skin warm dry no edema Neuro grossly normal Behavior normal, alert        Assessment & Plan:   Blood pressure very good Lungs doing well DOE improving with his physical therapy Echo later this month with visit with cardiology Holding off on rosuvastatin and spironolactone currently Taking atorvastatin currently Follow-up again in 3 months time Vaccine today As for the duodenal ulcer more than likely healing up fine he will follow-up with gastroenterology later this month and discussed with them whether or not he needs another EGD

## 2022-11-30 DIAGNOSIS — E785 Hyperlipidemia, unspecified: Secondary | ICD-10-CM | POA: Diagnosis not present

## 2022-11-30 DIAGNOSIS — Z8719 Personal history of other diseases of the digestive system: Secondary | ICD-10-CM | POA: Diagnosis not present

## 2022-11-30 DIAGNOSIS — Z96643 Presence of artificial hip joint, bilateral: Secondary | ICD-10-CM | POA: Diagnosis not present

## 2022-11-30 DIAGNOSIS — Z7982 Long term (current) use of aspirin: Secondary | ICD-10-CM | POA: Diagnosis not present

## 2022-11-30 DIAGNOSIS — Z471 Aftercare following joint replacement surgery: Secondary | ICD-10-CM | POA: Diagnosis not present

## 2022-11-30 DIAGNOSIS — I5022 Chronic systolic (congestive) heart failure: Secondary | ICD-10-CM | POA: Diagnosis not present

## 2022-11-30 DIAGNOSIS — G4733 Obstructive sleep apnea (adult) (pediatric): Secondary | ICD-10-CM | POA: Diagnosis not present

## 2022-11-30 DIAGNOSIS — D62 Acute posthemorrhagic anemia: Secondary | ICD-10-CM | POA: Diagnosis not present

## 2022-11-30 DIAGNOSIS — I252 Old myocardial infarction: Secondary | ICD-10-CM | POA: Diagnosis not present

## 2022-11-30 DIAGNOSIS — I110 Hypertensive heart disease with heart failure: Secondary | ICD-10-CM | POA: Diagnosis not present

## 2022-11-30 DIAGNOSIS — K922 Gastrointestinal hemorrhage, unspecified: Secondary | ICD-10-CM | POA: Diagnosis not present

## 2022-11-30 DIAGNOSIS — Z791 Long term (current) use of non-steroidal anti-inflammatories (NSAID): Secondary | ICD-10-CM | POA: Diagnosis not present

## 2022-12-06 ENCOUNTER — Ambulatory Visit (HOSPITAL_COMMUNITY)
Admission: RE | Admit: 2022-12-06 | Discharge: 2022-12-06 | Disposition: A | Discharge status: Home / Self Care | Payer: Medicare Other | Source: Ambulatory Visit | Attending: Cardiovascular Disease | Admitting: Cardiovascular Disease

## 2022-12-06 DIAGNOSIS — I509 Heart failure, unspecified: Secondary | ICD-10-CM | POA: Diagnosis not present

## 2022-12-06 LAB — ECHOCARDIOGRAM COMPLETE
Area-P 1/2: 4.39 cm2
Calc EF: 59.6 %
S' Lateral: 2.6 cm
Single Plane A2C EF: 55 %
Single Plane A4C EF: 62.1 %

## 2022-12-06 NOTE — Progress Notes (Signed)
*  PRELIMINARY RESULTS* Echocardiogram 2D Echocardiogram has been performed.  Stacey Drain 12/06/2022, 11:34 AM

## 2022-12-07 DIAGNOSIS — Z471 Aftercare following joint replacement surgery: Secondary | ICD-10-CM | POA: Diagnosis not present

## 2022-12-07 DIAGNOSIS — Z7982 Long term (current) use of aspirin: Secondary | ICD-10-CM | POA: Diagnosis not present

## 2022-12-07 DIAGNOSIS — G4733 Obstructive sleep apnea (adult) (pediatric): Secondary | ICD-10-CM | POA: Diagnosis not present

## 2022-12-07 DIAGNOSIS — E785 Hyperlipidemia, unspecified: Secondary | ICD-10-CM | POA: Diagnosis not present

## 2022-12-07 DIAGNOSIS — I252 Old myocardial infarction: Secondary | ICD-10-CM | POA: Diagnosis not present

## 2022-12-07 DIAGNOSIS — D62 Acute posthemorrhagic anemia: Secondary | ICD-10-CM | POA: Diagnosis not present

## 2022-12-07 DIAGNOSIS — I5022 Chronic systolic (congestive) heart failure: Secondary | ICD-10-CM | POA: Diagnosis not present

## 2022-12-07 DIAGNOSIS — K922 Gastrointestinal hemorrhage, unspecified: Secondary | ICD-10-CM | POA: Diagnosis not present

## 2022-12-07 DIAGNOSIS — Z96643 Presence of artificial hip joint, bilateral: Secondary | ICD-10-CM | POA: Diagnosis not present

## 2022-12-07 DIAGNOSIS — I110 Hypertensive heart disease with heart failure: Secondary | ICD-10-CM | POA: Diagnosis not present

## 2022-12-07 DIAGNOSIS — Z8719 Personal history of other diseases of the digestive system: Secondary | ICD-10-CM | POA: Diagnosis not present

## 2022-12-07 DIAGNOSIS — Z791 Long term (current) use of non-steroidal anti-inflammatories (NSAID): Secondary | ICD-10-CM | POA: Diagnosis not present

## 2022-12-13 ENCOUNTER — Encounter: Payer: Self-pay | Admitting: Medical

## 2022-12-13 ENCOUNTER — Ambulatory Visit: Payer: Medicare Other | Attending: Medical | Admitting: Medical

## 2022-12-13 ENCOUNTER — Telehealth: Payer: Self-pay | Admitting: *Deleted

## 2022-12-13 VITALS — BP 130/62 | HR 80 | Ht 68.0 in | Wt 195.8 lb

## 2022-12-13 DIAGNOSIS — I10 Essential (primary) hypertension: Secondary | ICD-10-CM | POA: Diagnosis not present

## 2022-12-13 DIAGNOSIS — E118 Type 2 diabetes mellitus with unspecified complications: Secondary | ICD-10-CM | POA: Diagnosis not present

## 2022-12-13 DIAGNOSIS — I2510 Atherosclerotic heart disease of native coronary artery without angina pectoris: Secondary | ICD-10-CM

## 2022-12-13 DIAGNOSIS — I5032 Chronic diastolic (congestive) heart failure: Secondary | ICD-10-CM

## 2022-12-13 DIAGNOSIS — E782 Mixed hyperlipidemia: Secondary | ICD-10-CM

## 2022-12-13 NOTE — Addendum Note (Signed)
Addended by: Kerney Elbe on: 12/13/2022 03:50 PM   Modules accepted: Orders

## 2022-12-13 NOTE — Progress Notes (Signed)
Cardiology Office Note:    Date:  12/13/2022   ID:  [name] [name], DOB Dec 12, 1950, MRN [id number]  PCP:  [name] Sciara, MD  [facility] Cardiologist:  Charlton Haws, MD  [name] Electrophysiologist:  None   Referring MD: [name] Sciara, MD   Chief Complaint: Echo follow-up  History of Present Illness:    [name] [name] is a 72 y.o. male with a hx of hypertension, hyperlipidemia, diabetes, chronic lower extremity edema, HFimpEF, CAD who presents for follow-up.  In 2020 patient had chest pain and Myoview Lexiscan was ordered, which showed no ischemia, overall low risk.  Echo in 2022 showed pump function 65 to 70%, normal RV function.  Patient was last seen a January 2023 reporting exertional fatigue and diaphoresis.  Cardiac CTA was ordered, which showed mild to moderate mixed CAD including left main, LAD and left circumflex, coronary calcium score 760 was 78th percentile for age and sex matched.  It was sent for FFR, which did not show clinically significant stenosis.  It did showed reduced FFR and small D1 branch at 0.72, however this was not amenable to intervention.   Patient was admitted at [facility] for acute anemia with hemoglobin of 4.7.  He was transfused with posttransfusion hemoglobin 8.3.  GI was consulted.  EGD showed 3 small nonbleeding ulcers.  No additional transfusions were required and hemoglobin remained stable.  Cardiology was consulted for elevated troponin, peaked at 1342.  Echo with newly reduced EF to 45% and new wall motion abnormality.  Plan was for left heart cath for ischemic evaluation but wanted to ensure patient was able to tolerate DAPT from a bleeding standpoint.  [name] [name] was for outpatient follow-up in 4 to 6 weeks.  She was started on Toprol, losartan, and spironolactone.  The cardiology office was notified of recent admission with low EF.  Repeat echo was ordered. Echo showed improved LVEF 55 to 60%, grade 1 diastolic dysfunction, normal RV  function, trivial AI.  Recent hemoglobin 11.8.  Today, the patient reports he is overall doing well.[name] [name] He denies any acute heart failure symptoms. NO LLE, orthopnea, pnd, palpitations, chest pain or shortness of breath. He had both hips replaced in the last 6 to 8 months, and is using a walker. Recovery is going as expected. He used to take hydrochlorothiazide for swelling, but stopped this due to frequent urination at night. But he has been good since that time. He elevated feet and eats low salt.  It does not appear he is taking spironolactone.  Past Medical History:  Diagnosis Date   Aortic atherosclerosis (HCC) 10/19/2016    Seen on chest x-ray July 2018   Arthritis    right knee and hip    Cancer [facility])    prostate cancer    Chronic kidney disease    prostate cancer    Diabetes mellitus without complication (HCC)    GERD (gastroesophageal reflux disease)    Gout    Gout    Hypercholesteremia    Hyperglycemia    Hyperlipidemia    Hypertension    Sleep apnea    Status post THR (total hip replacement) 04/20/13 05/01/2013   Right total hip 04/20/2013 Dr. Randol Kern implant     Past Surgical History:  Procedure Laterality Date   COLONOSCOPY     COLONOSCOPY N/A 10/22/2013   Procedure: COLONOSCOPY;  Surgeon: West Bali, MD;  Location: [facility] SUITE;  Service: Endoscopy;  Laterality: N/A;  1115   HERNIA REPAIR  right inguinal hernia repair    LUMBAR LAMINECTOMY/DECOMPRESSION MICRODISCECTOMY Right 03/16/2021   Procedure: Right Lumbar four-five Laminectomy/foraminotomy;  Surgeon: Tia Alert, MD;  Location: [facility] OR;  Service: Neurosurgery;  Laterality: Right;   NASAL SEPTUM SURGERY     OTHER SURGICAL HISTORY     deviated septum surgery    ROBOT ASSISTED LAPAROSCOPIC RADICAL PROSTATECTOMY  04/20/2011   Procedure: ROBOTIC ASSISTED LAPAROSCOPIC RADICAL PROSTATECTOMY;  Surgeon: Anner Crete, MD;  Location: [facility] ORS;  Service: Urology;  Laterality: N/A;  Bilateral lymph node  dissection   TOTAL HIP ARTHROPLASTY Right 04/20/2013   Procedure: TOTAL HIP ARTHROPLASTY;  Surgeon: Vickki Hearing, MD;  Location: [facility] ORS;  Service: Orthopedics;  Laterality: Right;   TOTAL KNEE ARTHROPLASTY Right 05/16/2018   Procedure: TOTAL KNEE ARTHROPLASTY;  Surgeon: Vickki Hearing, MD;  Location: [facility] ORS;  Service: Orthopedics;  Laterality: Right;    Current Medications: Current Meds  Medication Sig   allopurinol (ZYLOPRIM) 100 MG tablet TAKE 1 TABLET BY MOUTH DAILY   amLODipine (NORVASC) 5 MG tablet TAKE 1 TABLET BY MOUTH DAILY   aspirin 81 MG chewable tablet Chew 81 mg by mouth daily.    atorvastatin (LIPITOR) 40 MG tablet Take 1 tablet (40 mg total) by mouth daily.   B Complex-C (SUPER B COMPLEX PO) Take 1 tablet by mouth daily.   buPROPion (WELLBUTRIN XL) 150 MG 24 hr tablet TAKE 1 TABLET BY MOUTH DAILY  STOP CITALOPRAM   Cholecalciferol 125 MCG (5000 UT) TABS Take by mouth.   ferrous sulfate 325 (65 FE) MG tablet Take by mouth.   losartan (COZAAR) 25 MG tablet Take 25 mg by mouth daily.   meloxicam (MOBIC) 7.5 MG tablet Take 7.5 mg by mouth daily.   metoprolol succinate (TOPROL-XL) 25 MG 24 hr tablet Take 12.5 mg by mouth daily.   Multiple Vitamin (MULTIVITAMIN) tablet Take 1 tablet by mouth daily.   pantoprazole (PROTONIX) 40 MG tablet Take 1 tablet (40 mg total) by mouth 2 (two) times daily.   Testosterone POWD Testosterone 4% cream, 3 clicks (0.49ml) topically qam     Allergies:   Citalopram and Lisinopril   Social History   Socioeconomic History   Marital status: Single    Spouse name: Not on file   Number of children: Not on file   Years of education: Not on file   Highest education level: Not on file  Occupational History   Not on file  Tobacco Use   Smoking status: Former    Current packs/day: 0.00    Average packs/day: 0.5 packs/day for 8.0 years (4.0 ttl pk-yrs)    Types: Cigarettes    Start date: 03/22/1976    Quit date: 03/22/1984    Years since  quitting: 38.7   Smokeless tobacco: Never  Vaping Use   Vaping status: Never Used  Substance and Sexual Activity   Alcohol use: Not Currently    Alcohol/week: 1.0 standard drink of alcohol    Types: 1 Cans of beer per week    Comment: occasional    Drug use: No   Sexual activity: Yes    Birth control/protection: None  Other Topics Concern   Not on file  Social History Narrative   Not on file   Social Determinants of Health   Financial Resource Strain: Low Risk  (12/24/2021)   Overall Financial Resource Strain (CARDIA)    Difficulty of Paying Living Expenses: Not hard at all  Food Insecurity: Low Risk  (09/25/2022)  Received from [facility]   Hunger Vital Sign    Worried About Running Out of Food in the Last Year: Never true    Ran Out of Food in the Last Year: Never true  Transportation Needs: Not on file (09/25/2022)  Physical Activity: Insufficiently Active (12/24/2021)   Exercise Vital Sign    Days of Exercise per Week: 4 days    Minutes of Exercise per Session: 20 min  Stress: No Stress Concern Present (12/24/2021)   [employer] of Occupational Health - Occupational Stress Questionnaire    Feeling of Stress : Not at all  Social Connections: Moderately Integrated (12/24/2021)   Social Connection and Isolation Panel [NHANES]    Frequency of Communication with Friends and Family: Three times a week    Frequency of Social Gatherings with Friends and Family: Once a week    Attends Religious Services: More than 4 times per year    Active Member of [employer] or Organizations: No    Attends Banker Meetings: Never    Marital Status: Living with partner     Family History: The patient's family history includes Diabetes in his mother; Hypertension in his brother and mother.  ROS:   Please see the history of present illness.     All other systems reviewed and are negative.  EKGs/Labs/Other Studies Reviewed:    The following studies were reviewed  today:  Echo 11/2022 1. Left ventricular ejection fraction, by estimation, is 55 to 60%. The  left ventricle has normal function. The left ventricle has no regional  wall motion abnormalities. Left ventricular diastolic parameters are  consistent with Grade I diastolic  dysfunction (impaired relaxation).   2. Right ventricular systolic function is normal. The right ventricular  size is normal.   3. The mitral valve is normal in structure. No evidence of mitral valve  regurgitation. No evidence of mitral stenosis.   4. The aortic valve was not well visualized. Aortic valve regurgitation  is trivial. No aortic stenosis is present.   5. The inferior vena cava is normal in size with greater than 50%  respiratory variability, suggesting right atrial pressure of 3 mmHg.   Echo 09/2022 -  SUMMARY  The left ventricular size is normal with normal left ventricular wall thickness.  Mild hypokinesis of mid to distal anteroseptum-anterior segments and the apex on contrasted views.  Left ventricular systolic function is mildly reduced.  LV ejection fraction = 40-45%.  Left ventricular filling pattern is prolonged relaxation.  The right ventricle is mildly dilated.  The right ventricular systolic function is normal.  The left and right atrium is mildly dilated.  There is no comparison study available.  The inferior vena cava was not visualized during the exam.  There is no pericardial effusion.  No comparison study. Note possible distal LAD territory wall motion abnormality.   Cardiac CTA 04/2021 IMPRESSION: 1. Mild to moderate mixed CAD, including left main, LAD and LCx. CADRADS = 3. CT FFR will be performed and reported separately.   2. Coronary calcium score of 760. This was 78th percentile for age and sex matched control.   3. Normal coronary origin with right dominance.   4. Aortic annular calcification and aortic atherosclerosis.     Electronically Signed   By: Chrystie Nose  M.D.   On: 05/04/2021 11:35   IMPRESSION: 1. CT FFR analysis did not show any clinically significant stenosis.   2. Reduced FFR noted in a small D1 branch at 0.72. This  branch is <2 mm and is not amenable to intervention.   3.  Aggressive coronary risk factor modification is recommended.     Electronically Signed   By: Chrystie Nose M.D.   On: 05/04/2021 11:43   EKG:  EKG is not ordered today.    Recent Labs: 10/26/2022: ALT 12; BUN 12; Creatinine, Ser 1.16; Platelets 478; Potassium 5.0; Sodium 141 11/25/2022: Hemoglobin 12.4  Recent Lipid Panel    Component Value Date/Time   CHOL 169 08/06/2022 0835   TRIG 117 08/06/2022 0835   HDL 46 08/06/2022 0835   CHOLHDL 3.7 08/06/2022 0835   CHOLHDL 4.7 12/05/2013 0705   VLDL 48 (H) 12/05/2013 0705   LDLCALC 102 (H) 08/06/2022 0835     Physical Exam:    VS:  BP 130/62   Pulse 80   Ht 5\' 8"  (1.727 m)   Wt 195 lb 12.8 oz (88.8 kg)   SpO2 96%   BMI 29.77 kg/m     Wt Readings from Last 3 Encounters:  12/13/22 195 lb 12.8 oz (88.8 kg)  11/29/22 194 lb 9.6 oz (88.3 kg)  11/25/22 192 lb (87.1 kg)     GEN:  Well nourished, well developed in no acute distress HEENT: Normal NECK: No JVD; No carotid bruits LYMPHATICS: No lymphadenopathy CARDIAC: RRR, no murmurs, rubs, gallops RESPIRATORY:  Clear to auscultation without rales, wheezing or rhonchi  ABDOMEN: Soft, non-tender, non-distended MUSCULOSKELETAL:  No edema; No deformity  SKIN: Warm and dry NEUROLOGIC:  Alert and oriented x 3 PSYCHIATRIC:  Normal affect   ASSESSMENT:    1. Heart failure with improved ejection fraction (HFimpEF) (HCC)   2. Coronary artery disease involving native coronary artery of native heart without angina pectoris   3. Essential hypertension   4. Hyperlipidemia, mixed   5. Type 2 diabetes mellitus with complication, without long-term current use of insulin (HCC)    PLAN:    In order of problems listed above:  HFimpEF Echo during  hospitalization (at an outside hospital) for acute anemia and NSTEMI in July 2024 showed mildly reduced EF 40- 45%.  EGD showed 3 small benign ulcers.  No heart cath was performed due to anemia and caution with DAPT. Patient was started on Toprol, losartan, spironolactone.  Recent hemoglobin 11.8.  Repeat echo in September 2024 showed normalized EF 55 to 60%, grade 1 diastolic dysfunction.  Patient is overall euvolemic on exam.  He is taking losartan and Toprol.  PCP held spironolactone (? For frequent urination at night). Patient has a known history of CAD with negative FFR in 2023.  Suspect nonischemic cardiomyopathy the setting of acute anemia, however will plan for MPI.  Continue losartan 25 mg daily and Toprol 12.5 mg daily.    CAD Patient was admitted for non-STEMI and mildly reduced EF (now normal) in July 2024 as above. Patient denies any anginal symptoms.  Patient had cardiac CTA 2023 showing mild to moderate CAD, including left main, LAD and circumflex, with negative FFR.  I will order a Myoview Lexiscan for completion.  Patient is taking aspirin 81 mg daily, Lipitor 40 mg daily, Toprol 12.5 mg daily.  HTN Blood pressure is normal today. Continue losartan, Toprol, and amlodipine.  HLD LDL 107 in July 2024.  Continue Lipitor 40 mg daily.  LDL goal less than 70.  Can increase Lipitor vs add Zetia.  DM2 Most recent A1c 5.2, this is followed by PCP.  Disposition: Follow up in 1 year(s) with MD/APP    Signed,  Kamelia Lampkins David Stall, PA-C  12/13/2022 2:07 PM    [facility]

## 2022-12-13 NOTE — Telephone Encounter (Signed)
Called pt to notify of the need to have Lexi scan stress test done. No answer. Left msg to call back.

## 2022-12-13 NOTE — Patient Instructions (Addendum)
Medication Instructions:  Your physician recommends that you continue on your current medications as directed. Please refer to the Current Medication list given to you today.  *If you need a refill on your cardiac medications before your next appointment, please call your pharmacy*   Lab Work: NONE   If you have labs (blood work) drawn today and your tests are completely normal, you will receive your results only by: MyChart Message (if you have MyChart) OR A paper copy in the mail If you have any lab test that is abnormal or we need to change your treatment, we will call you to review the results.   Testing/Procedures: Your physician has requested that you have a lexiscan myoview. For further information please visit [email or web address] Please follow instruction sheet, as given.    Follow-Up: At [facility], you and your health needs are our priority.  As part of our continuing mission to provide you with exceptional heart care, we have created designated Provider Care Teams.  These Care Teams include your primary Cardiologist (physician) and Advanced Practice Providers (APPs -  Physician Assistants and Nurse Practitioners) who all work together to provide you with the care you need, when you need it.  We recommend signing up for the patient portal called "MyChart".  Sign up information is provided on this After Visit Summary.  MyChart is used to connect with patients for Virtual Visits (Telemedicine).  Patients are able to view lab/test results, encounter notes, upcoming appointments, etc.  Non-urgent messages can be sent to your provider as well.   To learn more about what you can do with MyChart, go to [email or web address].    Your next appointment:   1 year(s)  Provider:   You may see Charlton Haws, MD or one of the following Advanced Practice Providers on your designated Care Team:   Randall An, PA-C  Jacolyn Reedy, PA-C     Other Instructions Thank you for  choosing [facility]!

## 2022-12-15 DIAGNOSIS — Z791 Long term (current) use of non-steroidal anti-inflammatories (NSAID): Secondary | ICD-10-CM | POA: Diagnosis not present

## 2022-12-15 DIAGNOSIS — K922 Gastrointestinal hemorrhage, unspecified: Secondary | ICD-10-CM | POA: Diagnosis not present

## 2022-12-15 DIAGNOSIS — Z8719 Personal history of other diseases of the digestive system: Secondary | ICD-10-CM | POA: Diagnosis not present

## 2022-12-15 DIAGNOSIS — E785 Hyperlipidemia, unspecified: Secondary | ICD-10-CM | POA: Diagnosis not present

## 2022-12-15 DIAGNOSIS — Z7982 Long term (current) use of aspirin: Secondary | ICD-10-CM | POA: Diagnosis not present

## 2022-12-15 DIAGNOSIS — I110 Hypertensive heart disease with heart failure: Secondary | ICD-10-CM | POA: Diagnosis not present

## 2022-12-15 DIAGNOSIS — G4733 Obstructive sleep apnea (adult) (pediatric): Secondary | ICD-10-CM | POA: Diagnosis not present

## 2022-12-15 DIAGNOSIS — D62 Acute posthemorrhagic anemia: Secondary | ICD-10-CM | POA: Diagnosis not present

## 2022-12-15 DIAGNOSIS — I252 Old myocardial infarction: Secondary | ICD-10-CM | POA: Diagnosis not present

## 2022-12-15 DIAGNOSIS — Z471 Aftercare following joint replacement surgery: Secondary | ICD-10-CM | POA: Diagnosis not present

## 2022-12-15 DIAGNOSIS — I5022 Chronic systolic (congestive) heart failure: Secondary | ICD-10-CM | POA: Diagnosis not present

## 2022-12-15 DIAGNOSIS — Z96643 Presence of artificial hip joint, bilateral: Secondary | ICD-10-CM | POA: Diagnosis not present

## 2022-12-16 ENCOUNTER — Encounter: Payer: Self-pay | Admitting: *Deleted

## 2022-12-16 NOTE — Telephone Encounter (Signed)
Pt given instructions for Lexi scan stress via phone and MyChart.

## 2022-12-20 ENCOUNTER — Telehealth (HOSPITAL_COMMUNITY): Payer: Self-pay | Admitting: Surgery

## 2022-12-20 NOTE — Telephone Encounter (Signed)
I called the pt to remind him of his stress tomorrow at 8:45.  The pt was instructed to not eat or drink anything 6 hours prior to the test (no caffeine or chocolate), to not smoke 8 hours prior to the test, and to wear comfortable clothes and no lotions or oils.  The pt verbalized understanding of these instructions.

## 2022-12-21 ENCOUNTER — Encounter (HOSPITAL_COMMUNITY)
Admission: RE | Admit: 2022-12-21 | Discharge: 2022-12-21 | Disposition: A | Discharge status: Home / Self Care | Payer: Medicare Other | Source: Ambulatory Visit | Attending: Medical | Admitting: Medical

## 2022-12-21 ENCOUNTER — Ambulatory Visit (HOSPITAL_COMMUNITY)
Admission: RE | Admit: 2022-12-21 | Discharge: 2022-12-21 | Disposition: A | Discharge status: Home / Self Care | Payer: Medicare Other | Source: Ambulatory Visit | Attending: Cardiovascular Disease | Admitting: Cardiovascular Disease

## 2022-12-21 DIAGNOSIS — I5032 Chronic diastolic (congestive) heart failure: Secondary | ICD-10-CM | POA: Diagnosis not present

## 2022-12-21 DIAGNOSIS — I2510 Atherosclerotic heart disease of native coronary artery without angina pectoris: Secondary | ICD-10-CM | POA: Insufficient documentation

## 2022-12-21 LAB — NM MYOCAR MULTI W/SPECT W/WALL MOTION / EF
LV dias vol: 71 mL (ref 62–150)
LV sys vol: 25 mL
Nuc Stress EF: 64 %
Peak HR: 100 {beats}/min
RATE: 0.3
Rest HR: 65 {beats}/min
Rest Nuclear Isotope Dose: 10.6 mCi
SDS: 1
SRS: 0
SSS: 1
ST Depression (mm): 0 mm
Stress Nuclear Isotope Dose: 32.3 mCi
TID: 1.19

## 2022-12-21 MED ORDER — TECHNETIUM TC 99M TETROFOSMIN IV KIT
10.6000 | PACK | Freq: Once | INTRAVENOUS | Status: AC | PRN
  Administered 2022-12-21: 10.6 via INTRAVENOUS

## 2022-12-21 MED ORDER — REGADENOSON 0.4 MG/5ML IV SOLN
INTRAVENOUS | Status: AC
  Administered 2022-12-21: 0.4 mg via INTRAVENOUS
  Filled 2022-12-21: qty 5

## 2022-12-21 MED ORDER — TECHNETIUM TC 99M TETROFOSMIN IV KIT
32.3000 | PACK | Freq: Once | INTRAVENOUS | Status: AC | PRN
  Administered 2022-12-21: 32.3 via INTRAVENOUS

## 2022-12-21 MED ORDER — SODIUM CHLORIDE FLUSH 0.9 % IV SOLN
INTRAVENOUS | Status: AC
  Administered 2022-12-21: 10 mL via INTRAVENOUS
  Filled 2022-12-21: qty 10

## 2022-12-31 ENCOUNTER — Ambulatory Visit: Payer: Medicare Other

## 2022-12-31 VITALS — Ht 68.0 in | Wt 195.0 lb

## 2022-12-31 DIAGNOSIS — Z0000 Encounter for general adult medical examination without abnormal findings: Secondary | ICD-10-CM

## 2022-12-31 NOTE — Progress Notes (Signed)
Subjective:   [name] [name] is a 72 y.o. male who presents for Medicare Annual/Subsequent preventive examination.  Visit Complete: Virtual I connected with  [name] [name] on 12/31/22 by a video and audio enabled telemedicine application and verified that I am speaking with the correct person using two identifiers.  Patient Location: Home  Provider Location: Home Office  I discussed the limitations of evaluation and management by telemedicine. The patient expressed understanding and agreed to proceed.  Vital Signs: Because this visit was a virtual/telehealth visit, some criteria may be missing or patient reported. Any vitals not documented were not able to be obtained and vitals that have been documented are patient reported.  Patient Medicare AWV questionnaire was completed by the patient on 12/31/22; I have confirmed that all information answered by patient is correct and no changes since this date.  Cardiac Risk Factors include: advanced age (>46men, >24 women);diabetes mellitus;hypertension;male gender     Objective:    Today's Vitals   12/31/22 2028  Weight: 195 lb (88.5 kg)  Height: 5\' 8"  (1.727 m)   Body mass index is 29.65 kg/m.     12/31/2022    8:33 PM 12/24/2021   10:02 AM 07/20/2021   10:44 AM 06/16/2021    9:18 AM 03/16/2021    8:14 AM 12/14/2020    8:21 AM 06/23/2018    2:57 PM  Advanced Directives  Does Patient Have a Medical Advance Directive? No No No No No No No  Would patient like information on creating a medical advance directive? Yes (MAU/Ambulatory/Procedural Areas - Information given) No - Patient declined No - Patient declined No - Patient declined No - Patient declined No - Patient declined     Current Medications (verified) Outpatient Encounter Medications as of 12/31/2022  Medication Sig   allopurinol (ZYLOPRIM) 100 MG tablet TAKE 1 TABLET BY MOUTH DAILY   amLODipine (NORVASC) 5 MG tablet TAKE 1 TABLET BY MOUTH DAILY   aspirin 81 MG  chewable tablet Chew 81 mg by mouth daily.    atorvastatin (LIPITOR) 40 MG tablet Take 1 tablet (40 mg total) by mouth daily.   B Complex-C (SUPER B COMPLEX PO) Take 1 tablet by mouth daily.   buPROPion (WELLBUTRIN XL) 150 MG 24 hr tablet TAKE 1 TABLET BY MOUTH DAILY  STOP CITALOPRAM   Cholecalciferol 125 MCG (5000 UT) TABS Take by mouth.   ferrous sulfate 325 (65 FE) MG tablet Take by mouth.   losartan (COZAAR) 25 MG tablet Take 25 mg by mouth daily.   meloxicam (MOBIC) 7.5 MG tablet Take 7.5 mg by mouth daily.   metoprolol succinate (TOPROL-XL) 25 MG 24 hr tablet Take 12.5 mg by mouth daily.   Multiple Vitamin (MULTIVITAMIN) tablet Take 1 tablet by mouth daily.   pantoprazole (PROTONIX) 40 MG tablet Take 1 tablet (40 mg total) by mouth 2 (two) times daily.   Testosterone POWD Testosterone 4% cream, 3 clicks (0.31ml) topically qam   No facility-administered encounter medications on file as of 12/31/2022.    Allergies (verified) Citalopram and Lisinopril   History: Past Medical History:  Diagnosis Date   Aortic atherosclerosis (HCC) 10/19/2016    Seen on chest x-ray July 2018   Arthritis    right knee and hip    Cancer [facility])    prostate cancer    Chronic kidney disease    prostate cancer    Diabetes mellitus without complication (HCC)    GERD (gastroesophageal reflux disease)    Gout  Gout    Hypercholesteremia    Hyperglycemia    Hyperlipidemia    Hypertension    Sleep apnea    Status post THR (total hip replacement) 04/20/13 05/01/2013   Right total hip 04/20/2013 Dr. Randol Kern implant    Past Surgical History:  Procedure Laterality Date   COLONOSCOPY     COLONOSCOPY N/A 10/22/2013   Procedure: COLONOSCOPY;  Surgeon: West Bali, MD;  Location: [facility] SU[facility];  Service: Endoscopy;  Laterality: N/A;  1115   HERNIA REPAIR     right inguinal hernia repair    LUMBAR LAMINECTOMY/DECOMPRESSION MICRODISCECTOMY Right 03/16/2021   Procedure: Right Lumbar four-five  Laminectomy/foraminotomy;  Surgeon: Tia Alert, MD;  Location: [facility] OR;  Service: Neurosurgery;  Laterality: Right;   NASAL SEPTUM SURGERY     OTHER SURGICAL HISTORY     deviated septum surgery    ROBOT ASSISTED LAPAROSCOPIC RADICAL PROSTATECTOMY  04/20/2011   Procedure: ROBOTIC ASSISTED LAPAROSCOPIC RADICAL PROSTATECTOMY;  Surgeon: Anner Crete, MD;  Location: [facility] ORS;  Service: Urology;  Laterality: N/A;  Bilateral lymph node dissection   TOTAL HIP ARTHROPLASTY Right 04/20/2013   Procedure: TOTAL HIP ARTHROPLASTY;  Surgeon: Vickki Hearing, MD;  Location: [facility] ORS;  Service: Orthopedics;  Laterality: Right;   TOTAL KNEE ARTHROPLASTY Right 05/16/2018   Procedure: TOTAL KNEE ARTHROPLASTY;  Surgeon: Vickki Hearing, MD;  Location: [facility] ORS;  Service: Orthopedics;  Laterality: Right;   Family History  Problem Relation Age of Onset   Diabetes Mother    Hypertension Mother    Hypertension Brother    Social History   Socioeconomic History   Marital status: Single    Spouse name: Not on file   Number of children: Not on file   Years of education: Not on file   Highest education level: Not on file  Occupational History   Not on file  Tobacco Use   Smoking status: Former    Current packs/day: 0.00    Average packs/day: 0.5 packs/day for 8.0 years (4.0 ttl pk-yrs)    Types: Cigarettes    Start date: 03/22/1976    Quit date: 03/22/1984    Years since quitting: 38.8   Smokeless tobacco: Never  Vaping Use   Vaping status: Never Used  Substance and Sexual Activity   Alcohol use: Not Currently    Alcohol/week: 1.0 standard drink of alcohol    Types: 1 Cans of beer per week    Comment: occasional    Drug use: No   Sexual activity: Yes    Birth control/protection: None  Other Topics Concern   Not on file  Social History Narrative   Not on file   Social Determinants of Health   Financial Resource Strain: Medium Risk (12/31/2022)   Overall Financial Resource Strain (CARDIA)     Difficulty of Paying Living Expenses: Somewhat hard  Food Insecurity: No Food Insecurity (12/31/2022)   Hunger Vital Sign    Worried About Running Out of Food in the Last Year: Never true    Ran Out of Food in the Last Year: Never true  Transportation Needs: No Transportation Needs (12/31/2022)   PRAPARE - Administrator, Civil Service (Medical): No    Lack of Transportation (Non-Medical): No  Physical Activity: Insufficiently Active (12/31/2022)   Exercise Vital Sign    Days of Exercise per Week: 2 days    Minutes of Exercise per Session: 20 min  Stress: No Stress Concern Present (12/31/2022)   [place]  [employer] of Occupational Health - Occupational Stress Questionnaire    Feeling of Stress : Not at all  Social Connections: Moderately Isolated (12/31/2022)   Social Connection and Isolation Panel [NHANES]    Frequency of Communication with Friends and Family: Once a week    Frequency of Social Gatherings with Friends and Family: Never    Attends Religious Services: More than 4 times per year    Active Member of [employer] or Organizations: No    Attends Engineer, structural: Never    Marital Status: Living with partner    Tobacco Counseling Counseling given: Not Answered   Clinical Intake:  Pre-visit preparation completed: Yes  Pain : No/denies pain     Diabetes: Yes CBG done?: No Did pt. bring in CBG monitor from home?: No  How often do you need to have someone help you when you read instructions, pamphlets, or other written materials from your doctor or pharmacy?: (P) 1 - Never  Interpreter Needed?: No  Information entered by :: Kandis Fantasia LPN   Activities of Daily Living    12/31/2022    9:24 AM  In your present state of health, do you have any difficulty performing the following activities:  Hearing? 0  Vision? 1  Difficulty concentrating or making decisions? 0  Walking or climbing stairs? 0  Dressing or bathing? 0  Doing errands,  shopping? 1  Preparing Food and eating ? Y  Using the Toilet? N  In the past six months, have you accidently leaked urine? Y  Do you have problems with loss of bowel control? N  Managing your Medications? N  Managing your Finances? N  Housekeeping or managing your Housekeeping? N    Patient Care Team: Babs Sciara, MD as PCP - General (Family Medicine) Wendall Stade, MD as PCP - Cardiology (Cardiology)  Indicate any recent Medical Services you may have received from other than [facility] providers in the past year (date may be approximate).     Assessment:   This is a routine wellness examination for [place].  Hearing/Vision screen Hearing Screening - Comments:: Denies hearing difficulties   Vision Screening - Comments:: Wears rx glasses - up to date with routine eye exams with [facility].     Goals Addressed   None   Depression Screen    12/31/2022    8:31 PM 10/26/2022    4:12 PM 08/11/2022   10:52 AM 12/24/2021   10:01 AM 06/02/2021   10:31 AM 12/14/2020    8:19 AM 12/03/2020   10:14 AM  PHQ 2/9 Scores  PHQ - 2 Score 2 3 3 2 5  0 0  PHQ- 9 Score 6 7 5 4 9       Fall Risk    12/31/2022    9:24 AM 10/26/2022    4:12 PM 08/11/2022   10:52 AM 12/24/2021   10:03 AM 12/14/2020    8:21 AM  Fall Risk   Falls in the past year? 1 1 1  0 0  Number falls in past yr: 0 0  0 0  Injury with Fall? 0 0 0 0 0  Risk for fall due to : History of fall(s);Impaired balance/gait;Impaired mobility  Impaired balance/gait;Impaired mobility No Fall Risks No Fall Risks  Follow up Falls prevention discussed;Education provided;Falls evaluation completed  Falls evaluation completed Falls prevention discussed;Falls evaluation completed Falls evaluation completed    MEDICARE RISK AT HOME: Medicare Risk at Home Any stairs in or around the home?: Yes If so, are  there any without handrails?: Yes Home free of loose throw rugs in walkways, pet beds, electrical cords, etc?: Yes Adequate lighting in your  home to reduce risk of falls?: Yes Life alert?: No Use of a cane, walker or w/c?: Yes Grab bars in the bathroom?: Yes Shower chair or bench in shower?: No Elevated toilet seat or a handicapped toilet?: Yes  TIMED UP AND GO:  Was the test performed?  No    Cognitive Function:        12/31/2022    8:34 PM 12/24/2021   10:04 AM 12/14/2020    8:23 AM  6CIT Screen  What Year? 0 points 0 points 0 points  What month? 0 points 0 points 0 points  What time? 0 points 0 points 0 points  Count back from 20 0 points 0 points 0 points  Months in reverse 0 points 0 points 0 points  Repeat phrase 0 points 0 points 0 points  Total Score 0 points 0 points 0 points    Immunizations Immunization History  Administered Date(s) Administered   Fluad Quad(high Dose 65+) 12/03/2020, 12/03/2021   Fluad Trivalent(High Dose 65+) 11/29/2022   Influenza,inj,Quad PF,6+ Mos 01/17/2014, 12/12/2014, 01/19/2016, 01/18/2017, 01/19/2018, 01/09/2019   Influenza-Unspecified 01/04/2020   Moderna Sars-Covid-2 Vaccination 05/03/2019, 06/01/2019, 01/19/2020   Pneumococcal Conjugate-13 01/17/2014   Pneumococcal Polysaccharide-23 03/04/2015   Zoster Recombinant(Shingrix) 11/11/2017, 04/12/2018   Zoster, Live 03/20/2014    TDAP status: Due, Education has been provided regarding the importance of this vaccine. Advised may receive this vaccine at local pharmacy or Health Dept. Aware to provide a copy of the vaccination record if obtained from local pharmacy or Health Dept. Verbalized acceptance and understanding.  Flu Vaccine status: Up to date  Pneumococcal vaccine status: Up to date  Covid-19 vaccine status: Information provided on how to obtain vaccines.   Qualifies for Shingles Vaccine? Yes   Zostavax completed Yes   Shingrix Completed?: Yes  Screening Tests Health Maintenance  Topic Date Due   DTaP/Tdap/Td (1 - Tdap) Never done   HEMOGLOBIN A1C  05/26/2021   FOOT EXAM  12/03/2021   COVID-19 Vaccine (4  - 2023-24 season) 11/21/2022   OPHTHALMOLOGY EXAM  03/03/2023   Diabetic kidney evaluation - Urine ACR  08/06/2023   Colonoscopy  10/23/2023   Diabetic kidney evaluation - eGFR measurement  10/26/2023   Medicare Annual Wellness (AWV)  12/31/2023   Pneumonia Vaccine 93+ Years old  Completed   INFLUENZA VACCINE  Completed   Hepatitis C Screening  Completed   Zoster Vaccines- Shingrix  Completed   HPV VACCINES  Aged Out    Health Maintenance  Health Maintenance Due  Topic Date Due   DTaP/Tdap/Td (1 - Tdap) Never done   HEMOGLOBIN A1C  05/26/2021   FOOT EXAM  12/03/2021   COVID-19 Vaccine (4 - 2023-24 season) 11/21/2022    Colorectal cancer screening: Type of screening: Colonoscopy. Completed 10/22/13. Repeat every 10 years  Lung Cancer Screening: (Low Dose CT Chest recommended if Age 40-80 years, 20 pack-year currently smoking OR have quit w/in 15years.) does not qualify.   Lung Cancer Screening Referral: n/a  Additional Screening:  Hepatitis C Screening: does qualify; Completed 03/26/13  Vision Screening: Recommended annual ophthalmology exams for early detection of glaucoma and other disorders of the eye. Is the patient up to date with their annual eye exam?  Yes  Who is the provider or what is the name of the office in which the patient attends annual eye exams? [facility].  If pt is not established with a provider, would they like to be referred to a provider to establish care? No .   Dental Screening: Recommended annual dental exams for proper oral hygiene  Diabetic Foot Exam: Diabetic Foot Exam: Overdue, Pt has been advised about the importance in completing this exam. Pt is scheduled for diabetic foot exam on at next office visit.  Community Resource Referral / Chronic Care Management: CRR required this visit?  No   CCM required this visit?  No     Plan:     I have personally reviewed and noted the following in the patient's chart:   Medical and social history Use  of alcohol, tobacco or illicit drugs  Current medications and supplements including opioid prescriptions. Patient is not currently taking opioid prescriptions. Functional ability and status Nutritional status Physical activity Advanced directives List of other physicians Hospitalizations, surgeries, and ER visits in previous 12 months Vitals Screenings to include cognitive, depression, and falls Referrals and appointments  In addition, I have reviewed and discussed with patient certain preventive protocols, quality metrics, and best practice recommendations. A written personalized care plan for preventive services as well as general preventive health recommendations were provided to patient.     Kandis Fantasia [place], [place]   16/12/9602   After Visit Summary: (MyChart) Due to this being a telephonic visit, the after visit summary with patients personalized plan was offered to patient via MyChart   Nurse Notes: No concerns at this time

## 2022-12-31 NOTE — Patient Instructions (Signed)
[name] [name] , Thank you for taking time to come for your Medicare Wellness Visit. I appreciate your ongoing commitment to your health goals. Please review the following plan we discussed and let me know if I can assist you in the future.   Referrals/Orders/Follow-Ups/Clinician Recommendations: Aim for 30 minutes of exercise or brisk walking, 6-8 glasses of water, and 5 servings of fruits and vegetables each day.  This is a list of the screening recommended for you and due dates:  Health Maintenance  Topic Date Due   DTaP/Tdap/Td vaccine (1 - Tdap) Never done   Hemoglobin A1C  05/26/2021   Complete foot exam   12/03/2021   COVID-19 Vaccine (4 - 2023-24 season) 11/21/2022   Eye exam for diabetics  03/03/2023   Yearly kidney health urinalysis for diabetes  08/06/2023   Colon Cancer Screening  10/23/2023   Yearly kidney function blood test for diabetes  10/26/2023   Medicare Annual Wellness Visit  12/31/2023   Pneumonia Vaccine  Completed   Flu Shot  Completed   Hepatitis C Screening  Completed   Zoster (Shingles) Vaccine  Completed   HPV Vaccine  Aged Out    Advanced directives: (ACP Link)Information on Advanced Care Planning can be found at [facility] of [place] Advance Health Care Directives Advance Health Care Directives ([email or web address]   Next Medicare Annual Wellness Visit scheduled for next year: Yes

## 2023-01-05 ENCOUNTER — Ambulatory Visit: Payer: Medicare Other | Admitting: Family Medicine

## 2023-01-06 ENCOUNTER — Encounter: Payer: Self-pay | Admitting: Internal Medicine

## 2023-01-06 ENCOUNTER — Encounter: Payer: Self-pay | Admitting: Family Medicine

## 2023-01-06 MED ORDER — KETOCONAZOLE 2 % EX CREA: TOPICAL_CREAM | CUTANEOUS | 0 refills | Status: DC

## 2023-01-06 NOTE — Telephone Encounter (Signed)
Nurses May send in ketoconazole cream 45 g apply thin amount twice to three times daily for 10 to 14 days  You may forward this message to the patient Typically this type of problem more common in the groin area because of traps heat and humidity the more he can do to let this area air out the better if the cream does not help significantly over the course of the next couple weeks may need to do follow-up office visit or try different measures thank you

## 2023-01-28 ENCOUNTER — Other Ambulatory Visit: Payer: Self-pay | Admitting: Family Medicine

## 2023-01-31 NOTE — Telephone Encounter (Signed)
Previously we discontinued this medicine Let patient know that we received a request for potassium from his pharmacy Please verify with patient is he currently taking potassium or not

## 2023-02-01 ENCOUNTER — Other Ambulatory Visit: Payer: Self-pay | Admitting: Family Medicine

## 2023-02-02 ENCOUNTER — Ambulatory Visit: Payer: Medicare Other | Admitting: Family Medicine

## 2023-02-02 DIAGNOSIS — Z471 Aftercare following joint replacement surgery: Secondary | ICD-10-CM | POA: Diagnosis not present

## 2023-02-02 DIAGNOSIS — Z96649 Presence of unspecified artificial hip joint: Secondary | ICD-10-CM | POA: Diagnosis not present

## 2023-02-02 DIAGNOSIS — Z96643 Presence of artificial hip joint, bilateral: Secondary | ICD-10-CM | POA: Diagnosis not present

## 2023-02-22 ENCOUNTER — Telehealth: Payer: Self-pay | Admitting: *Deleted

## 2023-02-22 NOTE — Telephone Encounter (Signed)
Copied from CRM [id number]. Topic: Clinical - Request for Lab/Test Order >> Feb 22, 2023  9:12 AM [name] [name] wrote: Reason for CRM: patient is calling to see if the Dr wants blood work done doing his appointment . Callback number [phone]

## 2023-02-22 NOTE — Telephone Encounter (Signed)
Lipid, liver, uric acid, metabolic 7, GGT diagnosis hyperlipidemia hypertension, history of gout, elevated alkaline phosphatase

## 2023-02-25 ENCOUNTER — Other Ambulatory Visit: Payer: Self-pay

## 2023-02-25 DIAGNOSIS — E785 Hyperlipidemia, unspecified: Secondary | ICD-10-CM

## 2023-02-25 DIAGNOSIS — I10 Essential (primary) hypertension: Secondary | ICD-10-CM

## 2023-02-25 DIAGNOSIS — R748 Abnormal levels of other serum enzymes: Secondary | ICD-10-CM

## 2023-02-25 DIAGNOSIS — Z79899 Other long term (current) drug therapy: Secondary | ICD-10-CM

## 2023-02-25 NOTE — Telephone Encounter (Signed)
Called pt and informed him labs have been put in to have them done before next visit

## 2023-02-28 DIAGNOSIS — Z79899 Other long term (current) drug therapy: Secondary | ICD-10-CM | POA: Diagnosis not present

## 2023-02-28 DIAGNOSIS — E785 Hyperlipidemia, unspecified: Secondary | ICD-10-CM | POA: Diagnosis not present

## 2023-02-28 DIAGNOSIS — I10 Essential (primary) hypertension: Secondary | ICD-10-CM | POA: Diagnosis not present

## 2023-02-28 DIAGNOSIS — R748 Abnormal levels of other serum enzymes: Secondary | ICD-10-CM | POA: Diagnosis not present

## 2023-03-01 LAB — HEPATIC FUNCTION PANEL
ALT: 13 [IU]/L (ref 0–44)
AST: 16 [IU]/L (ref 0–40)
Albumin: 4.4 g/dL (ref 3.8–4.8)
Alkaline Phosphatase: 149 [IU]/L — ABNORMAL HIGH (ref 44–121)
Bilirubin Total: 0.9 mg/dL (ref 0.0–1.2)
Bilirubin, Direct: 0.28 mg/dL (ref 0.00–0.40)
Total Protein: 7.1 g/dL (ref 6.0–8.5)

## 2023-03-01 LAB — BASIC METABOLIC PANEL
BUN/Creatinine Ratio: 11 (ref 10–24)
BUN: 13 mg/dL (ref 8–27)
CO2: 24 mmol/L (ref 20–29)
Calcium: 8.9 mg/dL (ref 8.6–10.2)
Chloride: 105 mmol/L (ref 96–106)
Creatinine, Ser: 1.19 mg/dL (ref 0.76–1.27)
Glucose: 98 mg/dL (ref 70–99)
Potassium: 5.3 mmol/L — ABNORMAL HIGH (ref 3.5–5.2)
Sodium: 144 mmol/L (ref 134–144)
eGFR: 64 mL/min/{1.73_m2} (ref >59)

## 2023-03-01 LAB — LIPID PANEL
Chol/HDL Ratio: 4.3 {ratio} (ref 0.0–5.0)
Cholesterol, Total: 175 mg/dL (ref 100–199)
HDL: 41 mg/dL (ref >39)
LDL Chol Calc (NIH): 111 mg/dL — ABNORMAL HIGH (ref 0–99)
Triglycerides: 130 mg/dL (ref 0–149)
VLDL Cholesterol Cal: 23 mg/dL (ref 5–40)

## 2023-03-01 LAB — URIC ACID: Uric Acid: 5.1 mg/dL (ref 3.8–8.4)

## 2023-03-01 LAB — GAMMA GT: GGT: 31 [IU]/L (ref 0–65)

## 2023-03-02 ENCOUNTER — Ambulatory Visit: Payer: Medicare Other | Admitting: Family Medicine

## 2023-03-02 VITALS — BP 135/79 | HR 81 | Temp 98.4°F | Ht 68.0 in | Wt 200.0 lb

## 2023-03-02 DIAGNOSIS — I10 Essential (primary) hypertension: Secondary | ICD-10-CM | POA: Diagnosis not present

## 2023-03-02 DIAGNOSIS — L570 Actinic keratosis: Secondary | ICD-10-CM | POA: Diagnosis not present

## 2023-03-02 DIAGNOSIS — E785 Hyperlipidemia, unspecified: Secondary | ICD-10-CM

## 2023-03-02 MED ORDER — EZETIMIBE 10 MG PO TABS: 10.0000 mg | ORAL_TABLET | Freq: Every day | ORAL | 3 refills | Status: DC

## 2023-03-02 NOTE — Progress Notes (Addendum)
Subjective:    Patient ID: [name] [name], male    DOB: 1950-07-31, 72 y.o.   MRN: [id number]  History of Present Illness   The patient, with a history of orthopedic issues, underwent two surgeries this year, one in February and another in June, followed by an endoscopy in July. Post-surgery, he spent thirty days in a rehabilitation nursing home. He reports that his right foot is now two inches longer than the left, necessitating new shoes and a foot lift. He is currently using a walker for mobility but expresses a desire to transition to a cane. However, he notes that excessive pressure on the right leg makes it difficult to walk the following day.  He also reports bouts of depression, which he attributes to the time of year and his current health situation. During his stay at the nursing home, he experienced a significant drop in hemocrit, requiring two units of blood. Despite this, his hemocrit did not increase, necessitating a helicopter transfer to [facility]. He expresses shock at the cost of the helicopter ride.  The patient has been monitoring his lab results online and was surprised that his glucose levels were not higher. He reports that he is currently taking cholesterol medication and baby aspirin, among other medications. He has stopped taking certain medications, including metoprolol and meloxicam.  He has noticed the development of spots on his scalp, which is causing him concern. He expresses interest in seeing a dermatologist for this issue. He also reports occasional swelling in his ankles after extensive walking. He expresses frustration with the slow recovery of his knee, which was damaged over two years ago, and is considering seeking a specialist for this issue.  The patient is managing his health issues but is hoping for a period of good health after a challenging year. He is open to communication and plans to keep the doctor updated on his condition.         Review of  Systems     Objective:    Physical Exam   CHEST: Clear lung fields to auscultation. EXTREMITIES: Ankle edema with extensive walking. SKIN: Actinic keratosis on scalp.     General-in no acute distress Eyes-no discharge Lungs-respiratory rate normal, CTA CV-no murmurs,RRR Extremities skin warm dry no edema Neuro grossly normal Behavior normal, alert   Results for orders placed or performed in visit on 02/25/23  Uric Acid  Result Value Ref Range   Uric Acid 5.1 3.8 - 8.4 mg/dL  Lipid Panel  Result Value Ref Range   Cholesterol, Total 175 100 - 199 mg/dL   Triglycerides 841 0 - 149 mg/dL   HDL 41 >32 mg/dL   VLDL Cholesterol Cal 23 5 - 40 mg/dL   LDL Chol Calc (NIH) 440 (H) 0 - 99 mg/dL   Chol/HDL Ratio 4.3 0.0 - 5.0 ratio  Basic Metabolic Panel  Result Value Ref Range   Glucose 98 70 - 99 mg/dL   BUN 13 8 - 27 mg/dL   Creatinine, Ser 1.02 0.76 - 1.27 mg/dL   eGFR 64 >72 ZD/GUY/4.03   BUN/Creatinine Ratio 11 10 - 24   Sodium 144 134 - 144 mmol/L   Potassium 5.3 (H) 3.5 - 5.2 mmol/L   Chloride 105 96 - 106 mmol/L   CO2 24 20 - 29 mmol/L   Calcium 8.9 8.6 - 10.2 mg/dL  Hepatic Function Panel  Result Value Ref Range   Total Protein 7.1 6.0 - 8.5 g/dL   Albumin 4.4 3.8 -  4.8 g/dL   Bilirubin Total 0.9 0.0 - 1.2 mg/dL   Bilirubin, Direct 1.61 0.00 - 0.40 mg/dL   Alkaline Phosphatase 149 (H) 44 - 121 IU/L   AST 16 0 - 40 IU/L   ALT 13 0 - 44 IU/L  Gamma GT  Result Value Ref Range   GGT 31 0 - 65 IU/L       Assessment & Plan:  Assessment and Plan    Orthopedic Post-Operative Recovery Recent hip surgery with subsequent leg length discrepancy. Currently using a walker, but desires to transition to a cane. Experiencing difficulty due to uneven leg lengths. -Plan to obtain shoe lift from local provider in [place]. -Consider referral for physical therapy to aid in transition from walker to cane.  Depression Reports bouts of depression, likely exacerbated by  recent health issues and time of year. -Monitor closely. Consider referral to mental health professional if symptoms persist or worsen.  Hyperlipidemia LDL slightly elevated at 111 despite current treatment with Atorvastatin. -Add Ezetimibe to current regimen to further lower LDL levels.  Dermatological Concerns Noted new spots on scalp, likely actinic keratosis. -Referral to local dermatologist for evaluation and possible cryotherapy.  General Health Maintenance -Plan for bone density scan in early 2025 to assess for osteoporosis given recent orthopedic issues. -Repeat labs in spring 2025 or sooner if patient's condition changes. -Follow-up appointment in 4-5 months, or sooner if needed.      Labs reviewed in detail Will do additional lab work before next follow-up in 3 months  Patient does have osteopenia on the x-rays that he showed me from when he had a hip replacement given his risk factors of obesity age and findings on x-ray I would recommend bone density

## 2023-03-03 ENCOUNTER — Other Ambulatory Visit: Payer: Self-pay

## 2023-03-03 DIAGNOSIS — M8580 Other specified disorders of bone density and structure, unspecified site: Secondary | ICD-10-CM

## 2023-05-23 ENCOUNTER — Other Ambulatory Visit: Payer: Medicare Other

## 2023-05-26 ENCOUNTER — Telehealth: Payer: Self-pay

## 2023-05-26 NOTE — Telephone Encounter (Signed)
 Reason for CRM: Patient wants to know if Dr.Luking would prescribe him ketoconazole cream 2%? Patient can be contacted by phone at [phone].        Preferred Pharmacy    [facility] - [place], [place] - [address]    [address] [place] [address]

## 2023-05-27 ENCOUNTER — Other Ambulatory Visit: Payer: Self-pay | Admitting: Family Medicine

## 2023-05-27 MED ORDER — KETOCONAZOLE 2 % EX CREA: 1.0000 | TOPICAL_CREAM | Freq: Two times a day (BID) | CUTANEOUS | 4 refills | Status: DC

## 2023-05-27 NOTE — Telephone Encounter (Signed)
Prescription was sent there as requested

## 2023-06-02 ENCOUNTER — Ambulatory Visit: Payer: Medicare Other | Admitting: Urology

## 2023-06-11 ENCOUNTER — Encounter: Payer: Self-pay | Admitting: Family Medicine

## 2023-06-13 ENCOUNTER — Other Ambulatory Visit: Payer: Self-pay | Admitting: Family Medicine

## 2023-06-13 MED ORDER — FLUCONAZOLE 150 MG PO TABS: ORAL_TABLET | ORAL | 0 refills | Status: DC

## 2023-06-13 NOTE — Telephone Encounter (Signed)
 Nurses You may share this message with Homero Fellers after acting on the below  #1-I would not recommend prednisone for jock itch.  Prednisone certainly can help with poison ivy but can make jock itch a whole lot worse.  If the ketoconazole is not really helping a lot we can try short course of Diflucan Homero Fellers can let us know what his thoughts are  #2-I would recommend a follow-up office visit with [place] in the near future either with myself or if I do not have any open slots with Toni Amend our PA for evaluation of these dizzy spells to check blood pressure sitting and standing and to help figure out what is going on and if indicated we can certainly help with a consult with a specialist if we cannot figure out what is going on thank you-Dr. Lorin Picket

## 2023-06-15 ENCOUNTER — Ambulatory Visit (INDEPENDENT_AMBULATORY_CARE_PROVIDER_SITE_OTHER): Admitting: Physician Assistant

## 2023-06-15 ENCOUNTER — Encounter: Payer: Self-pay | Admitting: Physician Assistant

## 2023-06-15 VITALS — BP 137/74 | HR 78 | Temp 98.2°F | Ht 68.0 in | Wt 198.0 lb

## 2023-06-15 DIAGNOSIS — L820 Inflamed seborrheic keratosis: Secondary | ICD-10-CM | POA: Diagnosis not present

## 2023-06-15 DIAGNOSIS — L2989 Other pruritus: Secondary | ICD-10-CM | POA: Diagnosis not present

## 2023-06-15 DIAGNOSIS — E875 Hyperkalemia: Secondary | ICD-10-CM

## 2023-06-15 DIAGNOSIS — D72820 Lymphocytosis (symptomatic): Secondary | ICD-10-CM | POA: Diagnosis not present

## 2023-06-15 DIAGNOSIS — H8110 Benign paroxysmal vertigo, unspecified ear: Secondary | ICD-10-CM | POA: Diagnosis not present

## 2023-06-15 DIAGNOSIS — L538 Other specified erythematous conditions: Secondary | ICD-10-CM | POA: Diagnosis not present

## 2023-06-15 MED ORDER — MECLIZINE HCL 12.5 MG PO TABS: 12.5000 mg | ORAL_TABLET | Freq: Three times a day (TID) | ORAL | 0 refills | Status: DC | PRN

## 2023-06-15 NOTE — Progress Notes (Signed)
 Acute Office Visit  Subjective:     Patient ID: [name] [name], male    DOB: Jun 06, 1950, 73 y.o.   MRN: [id number]   Patient presents today with concerns for dizziness.  He states symptoms have been intermittent since the fall however over the last few weeks have become more frequent.  He reports dizziness 1-2 times a week.  He describes dizziness like the room is spinning.  He states dizziness is most common with positional changes getting in and out of bed.  He reports he has attempted slower movements and positional changes, and has found this tends to improve symptoms.  He denies vision changes, tinnitus, hearing loss, falls, loss of consciousness.     Review of Systems  Constitutional:  Negative for fever and malaise/fatigue.  HENT:  Negative for hearing loss and tinnitus.   Eyes:  Negative for blurred vision and double vision.  Respiratory:  Negative for shortness of breath.   Cardiovascular:  Negative for chest pain.  Musculoskeletal:  Negative for falls.  Neurological:  Positive for dizziness. Negative for loss of consciousness, weakness and headaches.        Objective:     BP 137/74   Pulse 78   Temp 98.2 F (36.8 C)   Ht 5\' 8"  (1.727 m)   Wt 198 lb (89.8 kg)   SpO2 100%   BMI 30.11 kg/m   Physical Exam Constitutional:      Appearance: Normal appearance.  HENT:     Head: Normocephalic.     Mouth/Throat:     Mouth: Mucous membranes are moist.     Pharynx: Oropharynx is clear.  Eyes:     Extraocular Movements: Extraocular movements intact.     Right eye: No nystagmus.     Left eye: No nystagmus.     Conjunctiva/sclera: Conjunctivae normal.  Cardiovascular:     Rate and Rhythm: Normal rate and regular rhythm.     Heart sounds: Normal heart sounds. No murmur heard. Pulmonary:     Effort: Pulmonary effort is normal.     Breath sounds: Normal breath sounds.  Skin:    General: Skin is warm and dry.  Neurological:     General: No focal deficit present.      Mental Status: He is alert and oriented to person, place, and time.     Sensory: No sensory deficit.     Motor: No weakness.     Gait: Gait normal.  Psychiatric:        Mood and Affect: Mood normal.        Behavior: Behavior normal.     No results found for any visits on 06/15/23.      Assessment & Plan:  Benign paroxysmal positional vertigo, unspecified laterality Assessment & Plan: Stable.  Physical exam without abnormal findings.  Normal lung sounds, no nystagmus, negative neuro exam.  Symptoms most consistent with positional vertigo.  Patient advised to continue with slower motions and positional changes.  We discussed meclizine as needed for dizziness.  We discussed the option of vestibular rehab for vertigo symptoms, however at this time patient would like to defer.  Will do basic lab work to include CBC and CMP to rule out anemias or electrolyte disturbances contributing to the dizziness.  Patient with previously scheduled follow-up in the next 2 weeks.  Warning signs discussed.  Patient agreeable to plan.  Orders: -     Meclizine HCl; Take 1 tablet (12.5 mg total) by mouth 3 (three) times  daily as needed for dizziness.  Dispense: 30 tablet; Refill: 0 -     CBC with Differential/Platelet -     CMP14+EGFR  Hyperkalemia -     CMP14+EGFR  Lymphocytosis -     CBC with Differential/Platelet    Return for previosuly scheduled appt or if symptoms worsen or fail to improve.  Toni Amend Safiya Girdler, PA-C

## 2023-06-15 NOTE — Assessment & Plan Note (Signed)
 Stable.  Physical exam without abnormal findings.  Normal lung sounds, no nystagmus, negative neuro exam.  Symptoms most consistent with positional vertigo.  Patient advised to continue with slower motions and positional changes.  We discussed meclizine as needed for dizziness.  We discussed the option of vestibular rehab for vertigo symptoms, however at this time patient would like to defer.  Will do basic lab work to include CBC and CMP to rule out anemias or electrolyte disturbances contributing to the dizziness.  Patient with previously scheduled follow-up in the next 2 weeks.  Warning signs discussed.  Patient agreeable to plan.

## 2023-06-16 ENCOUNTER — Encounter: Payer: Self-pay | Admitting: Physician Assistant

## 2023-06-16 LAB — CMP14+EGFR
ALT: 19 IU/L (ref 0–44)
AST: 19 IU/L (ref 0–40)
Albumin: 4.4 g/dL (ref 3.8–4.8)
Alkaline Phosphatase: 126 IU/L — ABNORMAL HIGH (ref 44–121)
BUN/Creatinine Ratio: 13 (ref 10–24)
BUN: 14 mg/dL (ref 8–27)
Bilirubin Total: 0.8 mg/dL (ref 0.0–1.2)
CO2: 25 mmol/L (ref 20–29)
Calcium: 9.3 mg/dL (ref 8.6–10.2)
Chloride: 103 mmol/L (ref 96–106)
Creatinine, Ser: 1.07 mg/dL (ref 0.76–1.27)
Globulin, Total: 2.3 g/dL (ref 1.5–4.5)
Glucose: 89 mg/dL (ref 70–99)
Potassium: 5.2 mmol/L (ref 3.5–5.2)
Sodium: 143 mmol/L (ref 134–144)
Total Protein: 6.7 g/dL (ref 6.0–8.5)
eGFR: 72 mL/min/{1.73_m2} (ref >59)

## 2023-06-16 LAB — CBC WITH DIFFERENTIAL/PLATELET
Basophils Absolute: 0.1 10*3/uL (ref 0.0–0.2)
Basos: 1 %
EOS (ABSOLUTE): 0.1 10*3/uL (ref 0.0–0.4)
Eos: 2 %
Hematocrit: 45 % (ref 37.5–51.0)
Hemoglobin: 14.8 g/dL (ref 13.0–17.7)
Immature Grans (Abs): 0 10*3/uL (ref 0.0–0.1)
Immature Granulocytes: 0 %
Lymphocytes Absolute: 1.8 10*3/uL (ref 0.7–3.1)
Lymphs: 20 %
MCH: 29.4 pg (ref 26.6–33.0)
MCHC: 32.9 g/dL (ref 31.5–35.7)
MCV: 90 fL (ref 79–97)
Monocytes Absolute: 0.6 10*3/uL (ref 0.1–0.9)
Monocytes: 7 %
Neutrophils Absolute: 6.5 10*3/uL (ref 1.4–7.0)
Neutrophils: 70 %
Platelets: 257 10*3/uL (ref 150–450)
RBC: 5.03 x10E6/uL (ref 4.14–5.80)
RDW: 13.7 % (ref 11.6–15.4)
WBC: 9.2 10*3/uL (ref 3.4–10.8)

## 2023-06-30 ENCOUNTER — Ambulatory Visit: Admitting: Family Medicine

## 2023-06-30 ENCOUNTER — Encounter: Payer: Self-pay | Admitting: Family Medicine

## 2023-06-30 VITALS — BP 128/71 | HR 63 | Temp 98.6°F | Ht 68.0 in | Wt 195.2 lb

## 2023-06-30 DIAGNOSIS — I2510 Atherosclerotic heart disease of native coronary artery without angina pectoris: Secondary | ICD-10-CM

## 2023-06-30 DIAGNOSIS — I5032 Chronic diastolic (congestive) heart failure: Secondary | ICD-10-CM | POA: Diagnosis not present

## 2023-06-30 DIAGNOSIS — H8110 Benign paroxysmal vertigo, unspecified ear: Secondary | ICD-10-CM | POA: Diagnosis not present

## 2023-06-30 DIAGNOSIS — I252 Old myocardial infarction: Secondary | ICD-10-CM | POA: Insufficient documentation

## 2023-06-30 DIAGNOSIS — K279 Peptic ulcer, site unspecified, unspecified as acute or chronic, without hemorrhage or perforation: Secondary | ICD-10-CM | POA: Insufficient documentation

## 2023-06-30 DIAGNOSIS — I10 Essential (primary) hypertension: Secondary | ICD-10-CM

## 2023-06-30 MED ORDER — AMLODIPINE BESYLATE 5 MG PO TABS: ORAL_TABLET | ORAL | 3 refills | Status: DC

## 2023-06-30 NOTE — Patient Instructions (Signed)
 Referral placed.  Continue your medications.  Follow up in 3-6 months with Dr. Lorin Picket.

## 2023-06-30 NOTE — Assessment & Plan Note (Signed)
 Welcome: Amlodipine.  Continue.

## 2023-06-30 NOTE — Assessment & Plan Note (Signed)
Referring to vestibular rehab.

## 2023-06-30 NOTE — Progress Notes (Signed)
 Subjective:  Patient ID: [name] [name], male    DOB: 12/13/1950  Age: 72 y.o. MRN: [id number]  CC:   Chief Complaint  Patient presents with   Follow-up    4 month f/u  Pt unsure of medications he is taking     HPI:  73 year old male presents for follow-up.  Continues to have intermittent vertigo.  Was quite severe yesterday.  Will discuss vestibular rehab.  Blood pressure is well-controlled on amlodipine.  Patient is due for colonoscopy later in the year.  Will reach out to GI.  Denies chest pain or shortness of breath.  No abdominal pain.  Patient Active Problem List   Diagnosis Date Noted   PUD (peptic ulcer disease) 06/30/2023   CAD (coronary artery disease) 06/30/2023   History of non-ST elevation myocardial infarction (NSTEMI) 06/30/2023   Heart failure with improved ejection fraction (HFimpEF) (HCC) 06/30/2023   Benign paroxysmal positional vertigo 06/30/2023   Failed total hip arthroplasty (HCC) 01/01/2022   Benign essential hypertension 05/22/2018   Status post right knee replacement 05/16/18 05/22/2018   Aortic atherosclerosis (HCC) 10/19/2016   Prediabetes 03/26/2013   Hyperlipidemia 03/26/2013   Gout 03/26/2013   Prostate cancer (HCC) 04/20/2011    Social Hx   Social History   Socioeconomic History   Marital status: Single    Spouse name: Not on file   Number of children: Not on file   Years of education: Not on file   Highest education level: Bachelor's degree (e.g., BA, AB, BS)  Occupational History   Not on file  Tobacco Use   Smoking status: Former    Current packs/day: 0.00    Average packs/day: 0.5 packs/day for 8.0 years (4.0 ttl pk-yrs)    Types: Cigarettes    Start date: 03/22/1976    Quit date: 03/22/1984    Years since quitting: 39.2   Smokeless tobacco: Never  Vaping Use   Vaping status: Never Used  Substance and Sexual Activity   Alcohol use: Not Currently    Alcohol/week: 1.0 standard drink of alcohol    Types: 1 Cans of beer  per week    Comment: occasional    Drug use: No   Sexual activity: Yes    Birth control/protection: None  Other Topics Concern   Not on file  Social History Narrative   Not on file   Social Drivers of Health   Financial Resource Strain: Medium Risk (03/02/2023)   Overall Financial Resource Strain (CARDIA)    Difficulty of Paying Living Expenses: Somewhat hard  Food Insecurity: No Food Insecurity (03/02/2023)   Hunger Vital Sign    Worried About Running Out of Food in the Last Year: Never true    Ran Out of Food in the Last Year: Never true  Transportation Needs: No Transportation Needs (03/02/2023)   PRAPARE - Administrator, Civil Service (Medical): No    Lack of Transportation (Non-Medical): No  Physical Activity: Insufficiently Active (03/02/2023)   Exercise Vital Sign    Days of Exercise per Week: 2 days    Minutes of Exercise per Session: 10 min  Stress: Stress Concern Present (03/02/2023)   [employer] of Occupational Health - Occupational Stress Questionnaire    Feeling of Stress : Very much  Social Connections: Socially Isolated (03/02/2023)   Social Connection and Isolation Panel [NHANES]    Frequency of Communication with Friends and Family: Once a week    Frequency of Social Gatherings with Friends and Family: Never  Attends Religious Services: Never    Active Member of Clubs or Organizations: No    Attends Banker Meetings: Never    Marital Status: Living with partner    Review of Systems Per HPI  Objective:  BP 128/71   Pulse 63   Temp 98.6 F (37 C)   Ht 5\' 8"  (1.727 m)   Wt 195 lb 3.2 oz (88.5 kg)   SpO2 97%   BMI 29.68 kg/m      06/30/2023    8:21 AM 06/15/2023   11:43 AM 06/15/2023   11:15 AM  BP/Weight  Systolic BP 128 137 149  Diastolic BP 71 74 78  Wt. (Lbs) 195.2  198  BMI 29.68 kg/m2  30.11 kg/m2    Physical Exam Vitals and nursing note reviewed.  Constitutional:      General: He is not in acute  distress.    Appearance: Normal appearance.  HENT:     Head: Normocephalic and atraumatic.  Cardiovascular:     Rate and Rhythm: Normal rate and regular rhythm.  Pulmonary:     Effort: Pulmonary effort is normal.     Breath sounds: Normal breath sounds. No wheezing, rhonchi or rales.  Abdominal:     General: There is no distension.     Palpations: Abdomen is soft.     Tenderness: There is no abdominal tenderness.  Neurological:     Mental Status: He is alert.  Psychiatric:        Mood and Affect: Mood normal.        Behavior: Behavior normal.     Lab Results  Component Value Date   WBC 9.2 06/15/2023   HGB 14.8 06/15/2023   HCT 45.0 06/15/2023   PLT 257 06/15/2023   GLUCOSE 89 06/15/2023   CHOL 175 02/28/2023   TRIG 130 02/28/2023   HDL 41 02/28/2023   LDLCALC 111 (H) 02/28/2023   ALT 19 06/15/2023   AST 19 06/15/2023   NA 143 06/15/2023   K 5.2 06/15/2023   CL 103 06/15/2023   CREATININE 1.07 06/15/2023   BUN 14 06/15/2023   CO2 25 06/15/2023   TSH 1.960 11/30/2014   PSA <0.1 05/09/2019   HGBA1C 5.5 11/26/2020   MICROALBUR 5.45 (H) 12/05/2013     Assessment & Plan:  Benign paroxysmal positional vertigo, unspecified laterality Assessment & Plan: Referring to vestibular rehab.  Orders: -     Ambulatory referral to Physical Therapy  Coronary artery disease involving native coronary artery of native heart without angina pectoris Assessment & Plan: Stable.  Asymptomatic.  Follows with cardiology.   Heart failure with improved ejection fraction (HFimpEF) (HCC) Assessment & Plan: Stable.  Euvolemic.   Benign essential hypertension Assessment & Plan: Welcome: Amlodipine.  Continue.  Orders: -     amLODIPine Besylate; TAKE 1 TABLET BY MOUTH DAILY  Dispense: 100 tablet; Refill: 3    Follow-up:  3-6 months with PCP  Everlene Other DO [facility]

## 2023-06-30 NOTE — Assessment & Plan Note (Signed)
Stable. Euvolemic. ?

## 2023-06-30 NOTE — Assessment & Plan Note (Signed)
 Stable.  Asymptomatic.  Follows with cardiology.

## 2023-07-01 ENCOUNTER — Ambulatory Visit: Payer: Medicare Other | Admitting: Family Medicine

## 2023-07-18 ENCOUNTER — Other Ambulatory Visit: Payer: Medicare Other

## 2023-07-20 NOTE — Therapy (Signed)
 OUTPATIENT PHYSICAL THERAPY VESTIBULAR EVALUATION     Patient Name: [name] [name] MRN: [id number] DOB:1950-07-29, 73 y.o., male Today's Date: 07/22/2023  END OF SESSION:  PT End of Session - 07/22/23 0815     Visit Number 1    Number of Visits 4    Date for PT Re-Evaluation 08/19/23    Authorization Type [employer] Medicare    Authorization Time Period please check auth    PT Start Time 0806    PT Stop Time 0840    PT Time Calculation (min) 34 min    Activity Tolerance Patient tolerated treatment well    Behavior During Therapy [facility] for tasks assessed/performed             Past Medical History:  Diagnosis Date   Aortic atherosclerosis (HCC) 10/19/2016    Seen on chest x-ray July 2018   Arthritis    right knee and hip    Cancer (HCC)    prostate cancer    Chronic kidney disease    prostate cancer    Diabetes mellitus without complication (HCC)    GERD (gastroesophageal reflux disease)    Gout    Gout    Hypercholesteremia    Hyperglycemia    Hyperlipidemia    Hypertension    Sleep apnea    Status post THR (total hip replacement) 04/20/13 05/01/2013   Right total hip 04/20/2013 Dr. Asuncion Layer implant    Past Surgical History:  Procedure Laterality Date   COLONOSCOPY     COLONOSCOPY N/A 10/22/2013   Procedure: COLONOSCOPY;  Surgeon: Alyce Jubilee, MD;  Location: [facility];  Service: Endoscopy;  Laterality: N/A;  1115   HERNIA REPAIR     right inguinal hernia repair    LUMBAR LAMINECTOMY/DECOMPRESSION MICRODISCECTOMY Right 03/16/2021   Procedure: Right Lumbar four-five Laminectomy/foraminotomy;  Surgeon: Isadora Mar, MD;  Location: [facility] OR;  Service: Neurosurgery;  Laterality: Right;   NASAL SEPTUM SURGERY     OTHER SURGICAL HISTORY     deviated septum surgery    ROBOT ASSISTED LAPAROSCOPIC RADICAL PROSTATECTOMY  04/20/2011   Procedure: ROBOTIC ASSISTED LAPAROSCOPIC RADICAL PROSTATECTOMY;  Surgeon: Willye Harvey, MD;  Location: [facility] ORS;  Service: Urology;   Laterality: N/A;  Bilateral lymph node dissection   TOTAL HIP ARTHROPLASTY Right 04/20/2013   Procedure: TOTAL HIP ARTHROPLASTY;  Surgeon: Darrin Emerald, MD;  Location: [facility] ORS;  Service: Orthopedics;  Laterality: Right;   TOTAL KNEE ARTHROPLASTY Right 05/16/2018   Procedure: TOTAL KNEE ARTHROPLASTY;  Surgeon: Darrin Emerald, MD;  Location: [facility] ORS;  Service: Orthopedics;  Laterality: Right;   Patient Active Problem List   Diagnosis Date Noted   PUD (peptic ulcer disease) 06/30/2023   CAD (coronary artery disease) 06/30/2023   History of non-ST elevation myocardial infarction (NSTEMI) 06/30/2023   Heart failure with improved ejection fraction (HFimpEF) (HCC) 06/30/2023   Benign paroxysmal positional vertigo 06/30/2023   Failed total hip arthroplasty (HCC) 01/01/2022   Benign essential hypertension 05/22/2018   Status post right knee replacement 05/16/18 05/22/2018   Aortic atherosclerosis (HCC) 10/19/2016   Prediabetes 03/26/2013   Hyperlipidemia 03/26/2013   Gout 03/26/2013   Prostate cancer (HCC) 04/20/2011    PCP: Charlotta Cook MD REFERRING PROVIDER: Cook, Jayce G, DO  REFERRING DIAG: H81.10 (ICD-10-CM) - Benign paroxysmal positional vertigo, unspecified laterality  THERAPY DIAG:  Dizziness and giddiness  ONSET DATE: March of this year  Rationale for Evaluation and Treatment: Rehabilitation  SUBJECTIVE:   SUBJECTIVE STATEMENT: Gets  dizzy sitting up in bed; usually just lies back down.  Most recent episode Tuesday; has had a couple of episodes since March; room spins, feels lightheaded and nauseated; depends on someone to drive him due to vertigo and orthopedic issues Pt accompanied by: self  PERTINENT HISTORY:  Has had bilat THA had right hip reconstruction a year ago; walking with cane Needs left TKA Does see a cardiologist  PAIN: will not be addressing pain focus on vertigo/dizziness Are you having pain? Yes: NPRS scale: knees Pain location: knees Pain  description: aching Aggravating factors: standing, walking Relieving factors: rest  PRECAUTIONS: Fall   WEIGHT BEARING RESTRICTIONS: No  FALLS: Has patient fallen in last 6 months? No   PLOF: Independent  PATIENT GOALS: find ways to control my dizziness  OBJECTIVE:  Note: Objective measures were completed at Evaluation unless otherwise noted.  DIAGNOSTIC FINDINGS:   COGNITION: Overall cognitive status: Within functional limits for tasks assessed   POSTURE:  rounded shoulders and forward head  Cervical ROM:    Active AROM (deg) eval  Flexion full  Extension limited  Right lateral flexion   Left lateral flexion   Right rotation   Left rotation   (Blank rows = not tested)  STRENGTH: not tested  LOWER EXTREMITY MMT:   MMT Right eval Left eval  Hip flexion    Hip abduction    Hip adduction    Hip internal rotation    Hip external rotation    Knee flexion    Knee extension    Ankle dorsiflexion    Ankle plantarflexion    Ankle inversion    Ankle eversion    (Blank rows = not tested)  BED MOBILITY:  Sit to supine Modified independence Supine to sit Modified independence Rolling to Right Modified independence Rolling to Left Modified independence  TRANSFERS: Assistive device utilized: None  Sit to stand: Modified independence Stand to sit: Modified independence Chair to chair: Modified independence Floor:  not tested   GAIT: Gait pattern: decreased step length- Left and antalgic Distance walked: 80 ft in clinic Assistive device utilized: Single point cane Level of assistance: Modified independence Comments: antalgic gait; history of lower extremity bilat THA and needs bilateral TKA  FUNCTIONAL TESTS:  5 times sit to stand: 14.81 SLS unable  each side  PATIENT SURVEYS:  DHI 14/100  VESTIBULAR ASSESSMENT:  GENERAL OBSERVATION: glasses   SYMPTOM BEHAVIOR:  Subjective history: see above  Non-Vestibular symptoms:  none  Type of  dizziness: Spinning/Vertigo and Lightheadedness/Faint  Frequency: a couple times since March  Duration: sometimes brief but nausea can limit  Aggravating factors: Induced by position change: supine to sit  Relieving factors: lying supine, closing eyes, rest, and slow movements  Progression of symptoms: unchanged  OCULOMOTOR EXAM:  Ocular Alignment: normal  Ocular ROM: No Limitations  Spontaneous Nystagmus: absent  Gaze-Induced Nystagmus: absent  Smooth Pursuits: intact  Saccades:  not tested  Convergence/Divergence: 5 inches    VESTIBULAR - OCULAR REFLEX:   Slow VOR: not tested  VOR Cancellation: not tested  Head-Impulse Test: not tested  Dynamic Visual Acuity: not tested   POSITIONAL TESTING: Right Dix-Hallpike: no nystagmus Left Dix-Hallpike: no nystagmus Right Roll Test: no nystagmus and mild report of spinning Left Roll Test: no nystagmus  MOTION SENSITIVITY:  Motion Sensitivity Quotient Intensity: 0 = none, 1 = Lightheaded, 2 = Mild, 3 = Moderate, 4 = Severe, 5 = Vomiting  Intensity  1. Sitting to supine   2. Supine to L side  0  3. Supine to R side 2  4. Supine to sitting   5. L Hallpike-Dix 0  6. Up from L    7. R Hallpike-Dix 0  8. Up from R    9. Sitting, head tipped to L knee   10. Head up from L knee   11. Sitting, head tipped to R knee   12. Head up from R knee   13. Sitting head turns x5 0  14.Sitting head nods x5   15. In stance, 180 turn to L    16. In stance, 180 turn to R     OTHOSTATICS: not done  FUNCTIONAL GAIT: 5 times sit to stand: see above                                                                                                                             TREATMENT DATE: 07/22/23 physical therapy evaluation and HEP instruction   Canalith Repositioning:  Comment: Quick Li maneuver Gaze Adaptation:   Habituation:   Other:   PATIENT EDUCATION: Education details: Patient educated on exam findings, POC, scope of PT, HEP, and  information on BPPV. Person educated: Patient Education method: Explanation, Demonstration, and Handouts Education comprehension: verbalized understanding, returned demonstration, verbal cues required, and tactile cues required   HOME EXERCISE PROGRAM: Access Code: [facility] URL: [email or web address] Date: 07/22/2023 Prepared by: [facility] - Rehab  Patient Education - BPPV - What Is BPPV? - BPPV - BPPV - After BPPV Repositioning GOALS: Goals reviewed with patient? No  SHORT TERM GOALS: Target date: 08/05/2023  Patient will report 50% improvement overall Baseline: Goal status: INITIAL   LONG TERM GOALS: Target date: 08/19/2023  Patient will report 75% improvement overall Baseline:  Goal status: INITIAL  2.  Patient will improve DHI score by 4 points to demonstrate decreased functional dizziness Baseline: 14/100 Goal status: INITIAL  3.  Patient will be able to get out of bed without any dizziness episode for 2 weeks Baseline:  Goal status: INITIAL ASSESSMENT:  CLINICAL IMPRESSION: Patient is a 73 y.o. male who was seen today for physical therapy evaluation and treatment for H81.10 (ICD-10-CM) - Benign paroxysmal positional vertigo, unspecified laterality.  Patient demonstrates increased vestibular symptoms with provocative testing which is negatively impacting patient ability to perform ADLs and functional mobility tasks. Patient will benefit from skilled physical therapy services to address these deficits to improve level of function with ADLs, functional mobility tasks, and reduce risk for falls.    OBJECTIVE IMPAIRMENTS: Abnormal gait, decreased activity tolerance, dizziness, and postural dysfunction.   ACTIVITY LIMITATIONS: sleeping and bed mobility  PARTICIPATION LIMITATIONS:  getting out of bed  PERSONAL FACTORS:  multiple orthopedic issues  are also affecting patient's functional outcome.   REHAB POTENTIAL: Good  CLINICAL DECISION MAKING:  Evolving/moderate complexity  EVALUATION COMPLEXITY: Moderate   PLAN:  PT FREQUENCY: 1x/week  PT DURATION: 4 weeks  PLANNED INTERVENTIONS: 97164- PT Re-evaluation, 97110-Therapeutic exercises, 97530- Therapeutic activity, [id number]- Neuromuscular  re-education, [phone]- Self Care, 60454- Manual therapy, [phone]- Gait training, [phone]- Orthotic Fit/training, [phone]- Canalith repositioning, [id number]- Aquatic Therapy, [phone]- Splinting, Patient/Family education, Balance training, Stair training, Taping, Dry Needling, Joint mobilization, Joint manipulation, Spinal manipulation, Spinal mobilization, Scar mobilization, and DME instructions.   PLAN FOR NEXT SESSION: measure cervical rotation, check VOR; recheck horizontal canals   9:40 AM, 07/22/23 Jozalynn Noyce Small Daelin Haste MPT [facility] [place] [phone] Ph:[phone]  [facility] Medicare Auth Request Information  Date of referral: 06/30/23 Referring provider: Cook, Jayce G DO Referring diagnosis (ICD 10)? R42 Treatment diagnosis (ICD 10)? (if different than referring diagnosis) R42  Functional Tool Score: DHI 14/100  What was this (referring dx) caused by? Unspecified  Nature of Condition: Initial Onset (within last 3 months)   Laterality: Both  Current Functional Measure Score: Other DHI 14/100  Objective measurements identify impairments when they are compared to normal values, the uninvolved extremity, and prior level of function.  [x]  Yes  []  No  Objective assessment of functional ability: Minimal functional limitations   Briefly describe symptoms: vertigo with sitting up out of bed  How did symptoms start: March 2025  Average pain intensity:  Last 24 hours: none with vertigo  Past week: varies with lower extremities  How often does the pt experience symptoms? Occasionally  How much have the symptoms interfered with usual daily activities? Moderately  How has condition changed since care began at this facility? No change  In  general, how is the patients overall health? Good   BACK PAIN (STarT Back Screening Tool) No

## 2023-07-22 ENCOUNTER — Other Ambulatory Visit: Payer: Self-pay

## 2023-07-22 ENCOUNTER — Ambulatory Visit (HOSPITAL_COMMUNITY): Attending: Family Medicine

## 2023-07-22 DIAGNOSIS — H8110 Benign paroxysmal vertigo, unspecified ear: Secondary | ICD-10-CM | POA: Insufficient documentation

## 2023-07-22 DIAGNOSIS — R42 Dizziness and giddiness: Secondary | ICD-10-CM | POA: Insufficient documentation

## 2023-07-25 DIAGNOSIS — L821 Other seborrheic keratosis: Secondary | ICD-10-CM | POA: Diagnosis not present

## 2023-07-25 DIAGNOSIS — B356 Tinea cruris: Secondary | ICD-10-CM | POA: Diagnosis not present

## 2023-07-28 ENCOUNTER — Ambulatory Visit: Payer: Medicare Other | Admitting: Urology

## 2023-08-01 ENCOUNTER — Ambulatory Visit (HOSPITAL_COMMUNITY)

## 2023-08-01 DIAGNOSIS — R42 Dizziness and giddiness: Secondary | ICD-10-CM | POA: Diagnosis not present

## 2023-08-01 DIAGNOSIS — H8110 Benign paroxysmal vertigo, unspecified ear: Secondary | ICD-10-CM | POA: Diagnosis not present

## 2023-08-01 NOTE — Therapy (Signed)
 OUTPATIENT PHYSICAL THERAPY VESTIBULAR TREATMENT     Patient Name: [name] [name] MRN: [id number] DOB:Apr 01, 1950, 73 y.o., male Today's Date: 08/01/2023  END OF SESSION:  PT End of Session - 08/01/23 1416     Visit Number 2    Number of Visits 4    Date for PT Re-Evaluation 08/19/23    Authorization Type [employer] Medicare    Authorization Time Period please check auth    PT Start Time 1417    PT Stop Time 1457    PT Time Calculation (min) 40 min    Activity Tolerance Patient tolerated treatment well    Behavior During Therapy [facility] for tasks assessed/performed             Past Medical History:  Diagnosis Date   Aortic atherosclerosis (HCC) 10/19/2016    Seen on chest x-ray July 2018   Arthritis    right knee and hip    Cancer (HCC)    prostate cancer    Chronic kidney disease    prostate cancer    Diabetes mellitus without complication (HCC)    GERD (gastroesophageal reflux disease)    Gout    Gout    Hypercholesteremia    Hyperglycemia    Hyperlipidemia    Hypertension    Sleep apnea    Status post THR (total hip replacement) 04/20/13 05/01/2013   Right total hip 04/20/2013 Dr. Asuncion Layer implant    Past Surgical History:  Procedure Laterality Date   COLONOSCOPY     COLONOSCOPY N/A 10/22/2013   Procedure: COLONOSCOPY;  Surgeon: Alyce Jubilee, MD;  [facility];  Service: Endoscopy;  Laterality: N/A;  1115   HERNIA REPAIR     right inguinal hernia repair    LUMBAR LAMINECTOMY/DECOMPRESSION MICRODISCECTOMY Right 03/16/2021   Procedure: Right Lumbar four-five Laminectomy/foraminotomy;  Surgeon: Isadora Mar, MD;  Location: [facility] OR;  Service: Neurosurgery;  Laterality: Right;   NASAL SEPTUM SURGERY     OTHER SURGICAL HISTORY     deviated septum surgery    ROBOT ASSISTED LAPAROSCOPIC RADICAL PROSTATECTOMY  04/20/2011   Procedure: ROBOTIC ASSISTED LAPAROSCOPIC RADICAL PROSTATECTOMY;  Surgeon: Willye Harvey, MD;  Location: [facility] ORS;  Service: Urology;   Laterality: N/A;  Bilateral lymph node dissection   TOTAL HIP ARTHROPLASTY Right 04/20/2013   Procedure: TOTAL HIP ARTHROPLASTY;  Surgeon: Darrin Emerald, MD;  Location: [facility] ORS;  Service: Orthopedics;  Laterality: Right;   TOTAL KNEE ARTHROPLASTY Right 05/16/2018   Procedure: TOTAL KNEE ARTHROPLASTY;  Surgeon: Darrin Emerald, MD;  Location: [facility] ORS;  Service: Orthopedics;  Laterality: Right;   Patient Active Problem List   Diagnosis Date Noted   PUD (peptic ulcer disease) 06/30/2023   CAD (coronary artery disease) 06/30/2023   History of non-ST elevation myocardial infarction (NSTEMI) 06/30/2023   Heart failure with improved ejection fraction (HFimpEF) (HCC) 06/30/2023   Benign paroxysmal positional vertigo 06/30/2023   Failed total hip arthroplasty (HCC) 01/01/2022   Benign essential hypertension 05/22/2018   Status post right knee replacement 05/16/18 05/22/2018   Aortic atherosclerosis (HCC) 10/19/2016   Prediabetes 03/26/2013   Hyperlipidemia 03/26/2013   Gout 03/26/2013   Prostate cancer (HCC) 04/20/2011    PCP: Charlotta Cook MD REFERRING PROVIDER: Cook, Jayce G, DO  REFERRING DIAG: H81.10 (ICD-10-CM) - Benign paroxysmal positional vertigo, unspecified laterality  THERAPY DIAG:  Dizziness and giddiness  ONSET DATE: March of this year  Rationale for Evaluation and Treatment: Rehabilitation  SUBJECTIVE:   SUBJECTIVE STATEMENT: Did  have one episode of dizziness on Sat morning but if was brief; 10 sec at most laid back down for 1 minute and then on re-try was able to sit up   Eval:Gets dizzy sitting up in bed; usually just lies back down.  Most recent episode Tuesday; has had a couple of episodes since March; room spins, feels lightheaded and nauseated; depends on someone to drive him due to vertigo and orthopedic issues Pt accompanied by: self  PERTINENT HISTORY:  Has had bilat THA had right hip reconstruction a year ago; walking with cane Needs left TKA Does see  a cardiologist  PAIN: will not be addressing pain focus on vertigo/dizziness Are you having pain? Yes: NPRS scale: knees Pain location: knees Pain description: aching Aggravating factors: standing, walking Relieving factors: rest  PRECAUTIONS: Fall   WEIGHT BEARING RESTRICTIONS: No  FALLS: Has patient fallen in last 6 months? No   PLOF: Independent  PATIENT GOALS: find ways to control my dizziness  OBJECTIVE:  Note: Objective measures were completed at Evaluation unless otherwise noted.  DIAGNOSTIC FINDINGS:   COGNITION: Overall cognitive status: Within functional limits for tasks assessed   POSTURE:  rounded shoulders and forward head  Cervical ROM:    Active AROM (deg) eval  Flexion full  Extension limited  Right lateral flexion 30  Left lateral flexion 23  Right rotation 53  Left rotation 50  (Blank rows = not tested)  STRENGTH: not tested  LOWER EXTREMITY MMT:   MMT Right eval Left eval  Hip flexion    Hip abduction    Hip adduction    Hip internal rotation    Hip external rotation    Knee flexion    Knee extension    Ankle dorsiflexion    Ankle plantarflexion    Ankle inversion    Ankle eversion    (Blank rows = not tested)  BED MOBILITY:  Sit to supine Modified independence Supine to sit Modified independence Rolling to Right Modified independence Rolling to Left Modified independence  TRANSFERS: Assistive device utilized: None  Sit to stand: Modified independence Stand to sit: Modified independence Chair to chair: Modified independence Floor: not tested   GAIT: Gait pattern: decreased step length- Left and antalgic Distance walked: 80 ft in clinic Assistive device utilized: Single point cane Level of assistance: Modified independence Comments: antalgic gait; history of lower extremity bilat THA and needs bilateral TKA  FUNCTIONAL TESTS:  5 times sit to stand: 14.81 SLS unable  each side  PATIENT SURVEYS:  DHI  14/100  VESTIBULAR ASSESSMENT:  GENERAL OBSERVATION: glasses   SYMPTOM BEHAVIOR:  Subjective history: see above  Non-Vestibular symptoms: none  Type of dizziness: Spinning/Vertigo and Lightheadedness/Faint  Frequency: a couple times since March  Duration: sometimes brief but nausea can limit  Aggravating factors: Induced by position change: supine to sit  Relieving factors: lying supine, closing eyes, rest, and slow movements  Progression of symptoms: unchanged  OCULOMOTOR EXAM:  Ocular Alignment: normal  Ocular ROM: No Limitations  Spontaneous Nystagmus: absent  Gaze-Induced Nystagmus: absent  Smooth Pursuits: intact  Saccades: intact  Convergence/Divergence: 5 inches    VESTIBULAR - OCULAR REFLEX:   Slow VOR: not tested  VOR Cancellation: not tested  Head-Impulse Test: not tested  Dynamic Visual Acuity: not tested   POSITIONAL TESTING: Right Dix-Hallpike: no nystagmus Left Dix-Hallpike: no nystagmus Right Roll Test: no nystagmus and mild report of spinning Left Roll Test: no nystagmus  MOTION SENSITIVITY:  Motion Sensitivity Quotient Intensity: 0 =  none, 1 = Lightheaded, 2 = Mild, 3 = Moderate, 4 = Severe, 5 = Vomiting  Intensity  1. Sitting to supine   2. Supine to L side 0  3. Supine to R side 2  4. Supine to sitting   5. L Hallpike-Dix 0  6. Up from L    7. R Hallpike-Dix 0  8. Up from R    9. Sitting, head tipped to L knee   10. Head up from L knee   11. Sitting, head tipped to R knee   12. Head up from R knee   13. Sitting head turns x5 0  14.Sitting head nods x5   15. In stance, 180 turn to L    16. In stance, 180 turn to R     OTHOSTATICS: not done  FUNCTIONAL GAIT: 5 times sit to stand: see above                                                                                                                             TREATMENT DATE:  08/01/23 Review of HEP and goals Horizontal canal testing negative Cervical lateral flexion and rotation  measures Saccades- normal Supine: Cervical retractions 5" hold x 10 Scapular retractions 5" hold x 10 Cervical rotation stretch with towel x 5 each way Updated HEP   07/22/23 physical therapy evaluation and HEP instruction   Canalith Repositioning:  Comment: Quick Li maneuver Gaze Adaptation:   Habituation:   Other:   PATIENT EDUCATION: Education details: Patient educated on exam findings, POC, scope of PT, HEP, and information on BPPV. Person educated: Patient Education method: Explanation, Demonstration, and Handouts Education comprehension: verbalized understanding, returned demonstration, verbal cues required, and tactile cues required   HOME EXERCISE PROGRAM: 08/01/23 Exercises - Self-Epley Maneuver Right Ear  - 1 x daily - 7 x weekly - 3 sets - 10 reps - Self-Epley Maneuver Left Ear  - 1 x daily - 7 x weekly - 3 sets - 10 reps - Supine Chin Tuck  - 2 x daily - 7 x weekly - 1 sets - 10 reps - 5 sec hold - Supine Scapular Retraction  - 2 x daily - 7 x weekly - 1 sets - 10 reps - 5 sec hold  Access Code: [facility] URL: [email or web address] Date: 07/22/2023 Prepared by: [facility] - Rehab  Patient Education - BPPV - What Is BPPV? - BPPV - BPPV - After BPPV Repositioning GOALS: Goals reviewed with patient? No  SHORT TERM GOALS: Target date: 08/05/2023  Patient will report 50% improvement overall Baseline: Goal status: IN PROGRESS   LONG TERM GOALS: Target date: 08/19/2023  Patient will report 75% improvement overall Baseline:  Goal status: IN PROGRESS  2.  Patient will improve DHI score by 4 points to demonstrate decreased functional dizziness Baseline: 14/100 Goal status: IN PROGRESS  3.  Patient will be able to get out of bed without any dizziness episode for 2 weeks Baseline:  Goal status: IN PROGRESS ASSESSMENT:  CLINICAL IMPRESSION: Today's session started with a review of HEP and goals.  Patient verbalizes agreement with set rehab  goals.  Tested saccades; patient without issue there.  Checked cervical mobility lateral flexion and rotation see above.  Noted tightness with both but not painful.  Negative testing for horizontal canal.  Added postural exercise today for forward head and rounded shoulders and updated HEP.  Discussed when to perform self epley.  Patient will benefit from continued skilled therapy services to address deficits and promote return to optimal function.      Eval:Patient is a 73 y.o. male who was seen today for physical therapy evaluation and treatment for H81.10 (ICD-10-CM) - Benign paroxysmal positional vertigo, unspecified laterality.  Patient demonstrates increased vestibular symptoms with provocative testing which is negatively impacting patient ability to perform ADLs and functional mobility tasks. Patient will benefit from skilled physical therapy services to address these deficits to improve level of function with ADLs, functional mobility tasks, and reduce risk for falls.    OBJECTIVE IMPAIRMENTS: Abnormal gait, decreased activity tolerance, dizziness, and postural dysfunction.   ACTIVITY LIMITATIONS: sleeping and bed mobility  PARTICIPATION LIMITATIONS: getting out of bed  PERSONAL FACTORS: multiple orthopedic issues are also affecting patient's functional outcome.   REHAB POTENTIAL: Good  CLINICAL DECISION MAKING: Evolving/moderate complexity  EVALUATION COMPLEXITY: Moderate   PLAN:  PT FREQUENCY: 1x/week  PT DURATION: 4 weeks  PLANNED INTERVENTIONS: 97164- PT Re-evaluation, 97110-Therapeutic exercises, 97530- Therapeutic activity, 97112- Neuromuscular re-education, 97535- Self Care, 16109- Manual therapy, [phone]- Gait training, [phone]- Orthotic Fit/training, [phone]- Canalith repositioning, [id number]- Aquatic Therapy, [phone]- Splinting, Patient/Family education, Balance training, Stair training, Taping, Dry Needling, Joint mobilization, Joint manipulation, Spinal manipulation, Spinal  mobilization, Scar mobilization, and DME instructions.   PLAN FOR NEXT SESSION: postural exercises; keep re checking canals  2:58 PM, 08/01/23 Sherian Valenza Small Rim Thatch MPT [facility] [place] [phone]

## 2023-08-03 ENCOUNTER — Ambulatory Visit: Payer: Medicare Other | Admitting: Family Medicine

## 2023-08-03 DIAGNOSIS — Z96649 Presence of unspecified artificial hip joint: Secondary | ICD-10-CM | POA: Diagnosis not present

## 2023-08-03 DIAGNOSIS — Z96641 Presence of right artificial hip joint: Secondary | ICD-10-CM | POA: Diagnosis not present

## 2023-08-03 DIAGNOSIS — Z471 Aftercare following joint replacement surgery: Secondary | ICD-10-CM | POA: Diagnosis not present

## 2023-08-11 ENCOUNTER — Ambulatory Visit (HOSPITAL_COMMUNITY)

## 2023-08-11 DIAGNOSIS — R42 Dizziness and giddiness: Secondary | ICD-10-CM

## 2023-08-11 DIAGNOSIS — H8110 Benign paroxysmal vertigo, unspecified ear: Secondary | ICD-10-CM | POA: Diagnosis not present

## 2023-08-11 NOTE — Therapy (Signed)
 OUTPATIENT PHYSICAL THERAPY VESTIBULAR TREATMENT     Patient Name: [name] [name] MRN: [id number] DOB:1950/08/04, 73 y.o., male Today's Date: 08/11/2023  END OF SESSION:  PT End of Session - 08/11/23 1106     Visit Number 3    Number of Visits 4    Date for PT Re-Evaluation 08/19/23    Authorization Type UHC Medicare    Authorization Time Period please check auth    PT Start Time 1105    PT Stop Time 1145    PT Time Calculation (min) 40 min    Activity Tolerance Patient tolerated treatment well    Behavior During Therapy [facility] for tasks assessed/performed             Past Medical History:  Diagnosis Date   Aortic atherosclerosis (HCC) 10/19/2016    Seen on chest x-ray July 2018   Arthritis    right knee and hip    Cancer (HCC)    prostate cancer    Chronic kidney disease    prostate cancer    Diabetes mellitus without complication (HCC)    GERD (gastroesophageal reflux disease)    Gout    Gout    Hypercholesteremia    Hyperglycemia    Hyperlipidemia    Hypertension    Sleep apnea    Status post THR (total hip replacement) 04/20/13 05/01/2013   Right total hip 04/20/2013 Dr. Asuncion Layer implant    Past Surgical History:  Procedure Laterality Date   COLONOSCOPY     COLONOSCOPY N/A 10/22/2013   Procedure: COLONOSCOPY;  Surgeon: Alyce Jubilee, MD;  [facility];  Service: Endoscopy;  Laterality: N/A;  1115   HERNIA REPAIR     right inguinal hernia repair    LUMBAR LAMINECTOMY/DECOMPRESSION MICRODISCECTOMY Right 03/16/2021   Procedure: Right Lumbar four-five Laminectomy/foraminotomy;  Surgeon: Isadora Mar, MD;  Location: [facility] OR;  Service: Neurosurgery;  Laterality: Right;   NASAL SEPTUM SURGERY     OTHER SURGICAL HISTORY     deviated septum surgery    ROBOT ASSISTED LAPAROSCOPIC RADICAL PROSTATECTOMY  04/20/2011   Procedure: ROBOTIC ASSISTED LAPAROSCOPIC RADICAL PROSTATECTOMY;  Surgeon: Willye Harvey, MD;  Location: [facility] ORS;  Service: Urology;   Laterality: N/A;  Bilateral lymph node dissection   TOTAL HIP ARTHROPLASTY Right 04/20/2013   Procedure: TOTAL HIP ARTHROPLASTY;  Surgeon: Darrin Emerald, MD;  Location: [facility];  Service: Orthopedics;  Laterality: Right;   TOTAL KNEE ARTHROPLASTY Right 05/16/2018   Procedure: TOTAL KNEE ARTHROPLASTY;  Surgeon: Darrin Emerald, MD;  Location: [facility] ORS;  Service: Orthopedics;  Laterality: Right;   Patient Active Problem List   Diagnosis Date Noted   PUD (peptic ulcer disease) 06/30/2023   CAD (coronary artery disease) 06/30/2023   History of non-ST elevation myocardial infarction (NSTEMI) 06/30/2023   Heart failure with improved ejection fraction (HFimpEF) (HCC) 06/30/2023   Benign paroxysmal positional vertigo 06/30/2023   Failed total hip arthroplasty (HCC) 01/01/2022   Benign essential hypertension 05/22/2018   Status post right knee replacement 05/16/18 05/22/2018   Aortic atherosclerosis (HCC) 10/19/2016   Prediabetes 03/26/2013   Hyperlipidemia 03/26/2013   Gout 03/26/2013   Prostate cancer (HCC) 04/20/2011    PCP: Charlotta Cook MD REFERRING PROVIDER: Cook, Jayce G, DO  REFERRING DIAG: H81.10 (ICD-10-CM) - Benign paroxysmal positional vertigo, unspecified laterality  THERAPY DIAG:  Dizziness and giddiness  ONSET DATE: March of this year  Rationale for Evaluation and Treatment: Rehabilitation  SUBJECTIVE:   SUBJECTIVE STATEMENT: Did  not sleep well last night so took a Melatonin and so feels groggy this morning.  No dizzy spells since last visit  Eval:Gets dizzy sitting up in bed; usually just lies back down.  Most recent episode Tuesday; has had a couple of episodes since March; room spins, feels lightheaded and nauseated; depends on someone to drive him due to vertigo and orthopedic issues Pt accompanied by: self  PERTINENT HISTORY:  Has had bilat THA had right hip reconstruction a year ago; walking with cane Needs left TKA Does see a cardiologist  PAIN: will  not be addressing pain focus on vertigo/dizziness Are you having pain? Yes: NPRS scale: knees Pain location: knees Pain description: aching Aggravating factors: standing, walking Relieving factors: rest  PRECAUTIONS: Fall   WEIGHT BEARING RESTRICTIONS: No  FALLS: Has patient fallen in last 6 months? No   PLOF: Independent  PATIENT GOALS: find ways to control my dizziness  OBJECTIVE:  Note: Objective measures were completed at Evaluation unless otherwise noted.  DIAGNOSTIC FINDINGS:   COGNITION: Overall cognitive status: Within functional limits for tasks assessed   POSTURE:  rounded shoulders and forward head  Cervical ROM:    Active AROM (deg) eval  Flexion full  Extension limited  Right lateral flexion 30  Left lateral flexion 23  Right rotation 53  Left rotation 50  (Blank rows = not tested)  STRENGTH: not tested  LOWER EXTREMITY MMT:   MMT Right eval Left eval  Hip flexion    Hip abduction    Hip adduction    Hip internal rotation    Hip external rotation    Knee flexion    Knee extension    Ankle dorsiflexion    Ankle plantarflexion    Ankle inversion    Ankle eversion    (Blank rows = not tested)  BED MOBILITY:  Sit to supine Modified independence Supine to sit Modified independence Rolling to Right Modified independence Rolling to Left Modified independence  TRANSFERS: Assistive device utilized: None  Sit to stand: Modified independence Stand to sit: Modified independence Chair to chair: Modified independence Floor: not tested   GAIT: Gait pattern: decreased step length- Left and antalgic Distance walked: 80 ft in clinic Assistive device utilized: Single point cane Level of assistance: Modified independence Comments: antalgic gait; history of lower extremity bilat THA and needs bilateral TKA  FUNCTIONAL TESTS:  5 times sit to stand: 14.81 SLS unable  each side  PATIENT SURVEYS:  DHI 14/100  VESTIBULAR  ASSESSMENT:  GENERAL OBSERVATION: glasses   SYMPTOM BEHAVIOR:  Subjective history: see above  Non-Vestibular symptoms: none  Type of dizziness: Spinning/Vertigo and Lightheadedness/Faint  Frequency: a couple times since March  Duration: sometimes brief but nausea can limit  Aggravating factors: Induced by position change: supine to sit  Relieving factors: lying supine, closing eyes, rest, and slow movements  Progression of symptoms: unchanged  OCULOMOTOR EXAM:  Ocular Alignment: normal  Ocular ROM: No Limitations  Spontaneous Nystagmus: absent  Gaze-Induced Nystagmus: absent  Smooth Pursuits: intact  Saccades: intact  Convergence/Divergence: 5 inches    VESTIBULAR - OCULAR REFLEX:   Slow VOR: not tested  VOR Cancellation: not tested  Head-Impulse Test: not tested  Dynamic Visual Acuity: not tested   POSITIONAL TESTING: Right Dix-Hallpike: no nystagmus Left Dix-Hallpike: no nystagmus Right Roll Test: no nystagmus and mild report of spinning Left Roll Test: no nystagmus  MOTION SENSITIVITY:  Motion Sensitivity Quotient Intensity: 0 = none, 1 = Lightheaded, 2 = Mild, 3 = Moderate,  4 = Severe, 5 = Vomiting  Intensity  1. Sitting to supine   2. Supine to L side 0  3. Supine to R side 2  4. Supine to sitting   5. L Hallpike-Dix 0  6. Up from L    7. R Hallpike-Dix 0  8. Up from R    9. Sitting, head tipped to L knee   10. Head up from L knee   11. Sitting, head tipped to R knee   12. Head up from R knee   13. Sitting head turns x5 0  14.Sitting head nods x5   15. In stance, 180 turn to L    16. In stance, 180 turn to R     OTHOSTATICS: not done  FUNCTIONAL GAIT: 5 times sit to stand: see above                                                                                                                             TREATMENT DATE:  08/11/23 Seated: Moist heat to neck x 5' to improve soft tissue extensibility Cervical retractions x 10 Cervical retractions  with extension  Cervical rotation x 10 each Scapular retractions 5" x 10 Red theraband shoulder horizontal abduction 2 x 10 Red theraband shoulder diagonals x 10 each Updated HEP   08/01/23 Review of HEP and goals Horizontal canal testing negative Cervical lateral flexion and rotation measures Saccades- normal Supine: Cervical retractions 5" hold x 10 Scapular retractions 5" hold x 10 Cervical rotation stretch with towel x 5 each way Updated HEP   07/22/23 physical therapy evaluation and HEP instruction   Canalith Repositioning:  Comment: Quick Li maneuver Gaze Adaptation:   Habituation:   Other:   PATIENT EDUCATION: Education details: Patient educated on exam findings, POC, scope of PT, HEP, and information on BPPV. Person educated: Patient Education method: Explanation, Demonstration, and Handouts Education comprehension: verbalized understanding, returned demonstration, verbal cues required, and tactile cues required   HOME EXERCISE PROGRAM: 08/01/23 Exercises - Self-Epley Maneuver Right Ear  - 1 x daily - 7 x weekly - 3 sets - 10 reps - Self-Epley Maneuver Left Ear  - 1 x daily - 7 x weekly - 3 sets - 10 reps - Supine Chin Tuck  - 2 x daily - 7 x weekly - 1 sets - 10 reps - 5 sec hold - Supine Scapular Retraction  - 2 x daily - 7 x weekly - 1 sets - 10 reps - 5 sec hold  Access Code: [facility] URL: [email or web address] Date: 07/22/2023 Prepared by: [facility] - Rehab  Patient Education - BPPV - What Is BPPV? - BPPV - BPPV - After BPPV Repositioning GOALS: Goals reviewed with patient? No  SHORT TERM GOALS: Target date: 08/05/2023  Patient will report 50% improvement overall Baseline: Goal status: IN PROGRESS   LONG TERM GOALS: Target date: 08/19/2023  Patient will report 75% improvement overall Baseline:  Goal status: IN PROGRESS  2.  Patient will improve DHI score by 4 points to demonstrate decreased functional dizziness Baseline:  14/100 Goal status: IN PROGRESS  3.  Patient will be able to get out of bed without any dizziness episode for 2 weeks Baseline:  Goal status: IN PROGRESS ASSESSMENT:  CLINICAL IMPRESSION: No dizziness so focus on cervical mobility and postural strengthening today.  Updated HEP.  Patient with noted improving postural awareness. Continues to walk with cane and slight limp.    Patient will benefit from continued skilled therapy services to address deficits and promote return to optimal function.      Eval:Patient is a 73 y.o. male who was seen today for physical therapy evaluation and treatment for H81.10 (ICD-10-CM) - Benign paroxysmal positional vertigo, unspecified laterality.  Patient demonstrates increased vestibular symptoms with provocative testing which is negatively impacting patient ability to perform ADLs and functional mobility tasks. Patient will benefit from skilled physical therapy services to address these deficits to improve level of function with ADLs, functional mobility tasks, and reduce risk for falls.    OBJECTIVE IMPAIRMENTS: Abnormal gait, decreased activity tolerance, dizziness, and postural dysfunction.   ACTIVITY LIMITATIONS: sleeping and bed mobility  PARTICIPATION LIMITATIONS: getting out of bed  PERSONAL FACTORS: multiple orthopedic issues are also affecting patient's functional outcome.   REHAB POTENTIAL: Good  CLINICAL DECISION MAKING: Evolving/moderate complexity  EVALUATION COMPLEXITY: Moderate   PLAN:  PT FREQUENCY: 1x/week  PT DURATION: 4 weeks  PLANNED INTERVENTIONS: 97164- PT Re-evaluation, 97110-Therapeutic exercises, 97530- Therapeutic activity, 97112- Neuromuscular re-education, 97535- Self Care, 13244- Manual therapy, [phone]- Gait training, [phone]- Orthotic Fit/training, [phone]- Canalith repositioning, [id number]- Aquatic Therapy, [phone]- Splinting, Patient/Family education, Balance training, Stair training, Taping, Dry Needling, Joint mobilization,  Joint manipulation, Spinal manipulation, Spinal mobilization, Scar mobilization, and DME instructions.   PLAN FOR NEXT SESSION: postural exercises; reassess next visit  11:09 AM, 08/11/23 Riot Waterworth Small Cordelro Gautreau MPT [facility] [place] [phone]

## 2023-08-16 ENCOUNTER — Other Ambulatory Visit: Payer: Self-pay | Admitting: Family Medicine

## 2023-08-18 ENCOUNTER — Other Ambulatory Visit: Payer: Self-pay | Admitting: Family Medicine

## 2023-08-23 ENCOUNTER — Other Ambulatory Visit: Payer: Medicare Other

## 2023-08-23 DIAGNOSIS — Z8546 Personal history of malignant neoplasm of prostate: Secondary | ICD-10-CM

## 2023-08-23 DIAGNOSIS — E291 Testicular hypofunction: Secondary | ICD-10-CM

## 2023-08-24 ENCOUNTER — Ambulatory Visit (HOSPITAL_COMMUNITY): Attending: Family Medicine

## 2023-08-24 ENCOUNTER — Ambulatory Visit: Payer: Self-pay

## 2023-08-24 DIAGNOSIS — R42 Dizziness and giddiness: Secondary | ICD-10-CM | POA: Diagnosis not present

## 2023-08-24 LAB — HEMOGLOBIN AND HEMATOCRIT, BLOOD
Hematocrit: 43.8 % (ref 37.5–51.0)
Hemoglobin: 14.3 g/dL (ref 13.0–17.7)

## 2023-08-24 LAB — TESTOSTERONE: Testosterone: 280 ng/dL (ref 264–916)

## 2023-08-24 LAB — PSA: Prostate Specific Ag, Serum: <0.1 ng/mL (ref 0.0–4.0)

## 2023-08-24 NOTE — Therapy (Signed)
 OUTPATIENT PHYSICAL THERAPY VESTIBULAR TREATMENT  PHYSICAL THERAPY DISCHARGE SUMMARY  Visits from Start of Care: 4  Current functional level related to goals / functional outcomes: See below   Remaining deficits: See below   Education / Equipment: HEP   Patient agrees to discharge. Patient goals were met. Patient is being discharged due to meeting the stated rehab goals.      Patient Name: [name] [name] MRN: [id number] DOB:1950/06/23, 73 y.o., male Today's Date: 08/24/2023  END OF SESSION:  PT End of Session - 08/24/23 0811     Visit Number 4    Number of Visits 4    Date for PT Re-Evaluation 08/19/23    Authorization Type [facility] Medicare    Authorization Time Period please check auth    PT Start Time 0807    PT Stop Time 0835    PT Time Calculation (min) 28 min    Activity Tolerance Patient tolerated treatment well    Behavior During Therapy [facility] for tasks assessed/performed             Past Medical History:  Diagnosis Date   Aortic atherosclerosis (HCC) 10/19/2016    Seen on chest x-ray July 2018   Arthritis    right knee and hip    Cancer (HCC)    prostate cancer    Chronic kidney disease    prostate cancer    Diabetes mellitus without complication (HCC)    GERD (gastroesophageal reflux disease)    Gout    Gout    Hypercholesteremia    Hyperglycemia    Hyperlipidemia    Hypertension    Sleep apnea    Status post THR (total hip replacement) 04/20/13 05/01/2013   Right total hip 04/20/2013 Dr. Asuncion Layer implant    Past Surgical History:  Procedure Laterality Date   COLONOSCOPY     COLONOSCOPY N/A 10/22/2013   Procedure: COLONOSCOPY;  Surgeon: Alyce Jubilee, MD;  [facility];  Service: Endoscopy;  Laterality: N/A;  1115   HERNIA REPAIR     right inguinal hernia repair    LUMBAR LAMINECTOMY/DECOMPRESSION MICRODISCECTOMY Right 03/16/2021   Procedure: Right Lumbar four-five Laminectomy/foraminotomy;  Surgeon: Isadora Mar, MD;   Location: [facility] OR;  Service: Neurosurgery;  Laterality: Right;   NASAL SEPTUM SURGERY     OTHER SURGICAL HISTORY     deviated septum surgery    ROBOT ASSISTED LAPAROSCOPIC RADICAL PROSTATECTOMY  04/20/2011   Procedure: ROBOTIC ASSISTED LAPAROSCOPIC RADICAL PROSTATECTOMY;  Surgeon: Willye Harvey, MD;  Location: [facility] ORS;  Service: Urology;  Laterality: N/A;  Bilateral lymph node dissection   TOTAL HIP ARTHROPLASTY Right 04/20/2013   Procedure: TOTAL HIP ARTHROPLASTY;  Surgeon: Darrin Emerald, MD;  Location: [facility] ORS;  Service: Orthopedics;  Laterality: Right;   TOTAL KNEE ARTHROPLASTY Right 05/16/2018   Procedure: TOTAL KNEE ARTHROPLASTY;  Surgeon: Darrin Emerald, MD;  Location: [facility] ORS;  Service: Orthopedics;  Laterality: Right;   Patient Active Problem List   Diagnosis Date Noted   PUD (peptic ulcer disease) 06/30/2023   CAD (coronary artery disease) 06/30/2023   History of non-ST elevation myocardial infarction (NSTEMI) 06/30/2023   Heart failure with improved ejection fraction (HFimpEF) (HCC) 06/30/2023   Benign paroxysmal positional vertigo 06/30/2023   Failed total hip arthroplasty (HCC) 01/01/2022   Benign essential hypertension 05/22/2018   Status post right knee replacement 05/16/18 05/22/2018   Aortic atherosclerosis (HCC) 10/19/2016   Prediabetes 03/26/2013   Hyperlipidemia 03/26/2013   Gout 03/26/2013  Prostate cancer (HCC) 04/20/2011    PCP: Charlotta Cook MD REFERRING PROVIDER: Cook, Jayce G, DO  REFERRING DIAG: H81.10 (ICD-10-CM) - Benign paroxysmal positional vertigo, unspecified laterality  THERAPY DIAG:  Dizziness and giddiness  ONSET DATE: March of this year  Rationale for Evaluation and Treatment: Rehabilitation  SUBJECTIVE:   SUBJECTIVE STATEMENT: 75 to 80% better  Eval:Gets dizzy sitting up in bed; usually just lies back down.  Most recent episode Tuesday; has had a couple of episodes since March; room spins, feels lightheaded and nauseated; depends on  someone to drive him due to vertigo and orthopedic issues Pt accompanied by: self  PERTINENT HISTORY:  Has had bilat THA had right hip reconstruction a year ago; walking with cane Needs left TKA Does see a cardiologist  PAIN: will not be addressing pain focus on vertigo/dizziness Are you having pain? Yes: NPRS scale: knees Pain location: knees Pain description: aching Aggravating factors: standing, walking Relieving factors: rest  PRECAUTIONS: Fall   WEIGHT BEARING RESTRICTIONS: No  FALLS: Has patient fallen in last 6 months? No   PLOF: Independent  PATIENT GOALS: find ways to control my dizziness  OBJECTIVE:  Note: Objective measures were completed at Evaluation unless otherwise noted.  DIAGNOSTIC FINDINGS:   COGNITION: Overall cognitive status: Within functional limits for tasks assessed   POSTURE:  rounded shoulders and forward head  Cervical ROM:    Active AROM (deg) eval AROM 08/24/23  Flexion full   Extension limited   Right lateral flexion 30 30  Left lateral flexion 23 36  Right rotation 53 60  Left rotation 50 60  (Blank rows = not tested)  STRENGTH: not tested  LOWER EXTREMITY MMT:   MMT Right eval Left eval  Hip flexion    Hip abduction    Hip adduction    Hip internal rotation    Hip external rotation    Knee flexion    Knee extension    Ankle dorsiflexion    Ankle plantarflexion    Ankle inversion    Ankle eversion    (Blank rows = not tested)  BED MOBILITY:  Sit to supine Modified independence Supine to sit Modified independence Rolling to Right Modified independence Rolling to Left Modified independence  TRANSFERS: Assistive device utilized: None  Sit to stand: Modified independence Stand to sit: Modified independence Chair to chair: Modified independence Floor: not tested   GAIT: Gait pattern: decreased step length- Left and antalgic Distance walked: 80 ft in clinic Assistive device utilized: Single point  cane Level of assistance: Modified independence Comments: antalgic gait; history of lower extremity bilat THA and needs bilateral TKA  FUNCTIONAL TESTS:  5 times sit to stand: 14.81 SLS unable  each side  PATIENT SURVEYS:  DHI 14/100  VESTIBULAR ASSESSMENT:  GENERAL OBSERVATION: glasses   SYMPTOM BEHAVIOR:  Subjective history: see above  Non-Vestibular symptoms: none  Type of dizziness: Spinning/Vertigo and Lightheadedness/Faint  Frequency: a couple times since March  Duration: sometimes brief but nausea can limit  Aggravating factors: Induced by position change: supine to sit  Relieving factors: lying supine, closing eyes, rest, and slow movements  Progression of symptoms: unchanged  OCULOMOTOR EXAM:  Ocular Alignment: normal  Ocular ROM: No Limitations  Spontaneous Nystagmus: absent  Gaze-Induced Nystagmus: absent  Smooth Pursuits: intact  Saccades: intact  Convergence/Divergence: 5 inches    VESTIBULAR - OCULAR REFLEX:   Slow VOR: not tested  VOR Cancellation: not tested  Head-Impulse Test: not tested  Dynamic Visual Acuity: not tested  POSITIONAL TESTING: Right Dix-Hallpike: no nystagmus Left Dix-Hallpike: no nystagmus Right Roll Test: no nystagmus and mild report of spinning Left Roll Test: no nystagmus  MOTION SENSITIVITY:  Motion Sensitivity Quotient Intensity: 0 = none, 1 = Lightheaded, 2 = Mild, 3 = Moderate, 4 = Severe, 5 = Vomiting  Intensity  1. Sitting to supine   2. Supine to L side 0  3. Supine to R side 2  4. Supine to sitting   5. L Hallpike-Dix 0  6. Up from L    7. R Hallpike-Dix 0  8. Up from R    9. Sitting, head tipped to L knee   10. Head up from L knee   11. Sitting, head tipped to R knee   12. Head up from R knee   13. Sitting head turns x5 0  14.Sitting head nods x5   15. In stance, 180 turn to L    16. In stance, 180 turn to R     OTHOSTATICS: not done  FUNCTIONAL GAIT: 5 times sit to stand: see above                                                                                                                              TREATMENT DATE:  08/24/23 Progress note DHI 4/100 5 times sit to stand 13.56 sec SLS 12" on left and 14" on right AROM of C-spine see above Review of HEP   08/11/23 Seated: Moist heat to neck x 5' to improve soft tissue extensibility Cervical retractions x 10 Cervical retractions with extension  Cervical rotation x 10 each Scapular retractions 5" x 10 Red theraband shoulder horizontal abduction 2 x 10 Red theraband shoulder diagonals x 10 each Updated HEP   08/01/23 Review of HEP and goals Horizontal canal testing negative Cervical lateral flexion and rotation measures Saccades- normal Supine: Cervical retractions 5" hold x 10 Scapular retractions 5" hold x 10 Cervical rotation stretch with towel x 5 each way Updated HEP   07/22/23 physical therapy evaluation and HEP instruction   Canalith Repositioning:  Comment: Quick Li maneuver Gaze Adaptation:   Habituation:   Other:   PATIENT EDUCATION: Education details: Patient educated on exam findings, POC, scope of PT, HEP, and information on BPPV. Person educated: Patient Education method: Explanation, Demonstration, and Handouts Education comprehension: verbalized understanding, returned demonstration, verbal cues required, and tactile cues required   HOME EXERCISE PROGRAM: 08/01/23 Exercises - Self-Epley Maneuver Right Ear  - 1 x daily - 7 x weekly - 3 sets - 10 reps - Self-Epley Maneuver Left Ear  - 1 x daily - 7 x weekly - 3 sets - 10 reps - Supine Chin Tuck  - 2 x daily - 7 x weekly - 1 sets - 10 reps - 5 sec hold - Supine Scapular Retraction  - 2 x daily - 7 x weekly - 1 sets - 10 reps - 5 sec hold  Access Code:  [facility] URL: [email or web address] Date: 07/22/2023 Prepared by: [facility] - Rehab  Patient Education - BPPV - What Is BPPV? - BPPV - BPPV - After BPPV  Repositioning GOALS: Goals reviewed with patient? No  SHORT TERM GOALS: Target date: 08/05/2023  Patient will report 50% improvement overall Baseline: Goal status: MET   LONG TERM GOALS: Target date: 08/19/2023  Patient will report 75% improvement overall Baseline:  Goal status: MET  2.  Patient will improve DHI score by 4 points to demonstrate decreased functional dizziness Baseline: 14/100 Goal status: MET  3.  Patient will be able to get out of bed without any dizziness episode for 2 weeks Baseline:  Goal status: MET ASSESSMENT:  CLINICAL IMPRESSION: Progress note today.  Patient has met all set rehab goals and is agreeable to discharge at this time.    Eval:Patient is a 73 y.o. male who was seen today for physical therapy evaluation and treatment for H81.10 (ICD-10-CM) - Benign paroxysmal positional vertigo, unspecified laterality.  Patient demonstrates increased vestibular symptoms with provocative testing which is negatively impacting patient ability to perform ADLs and functional mobility tasks. Patient will benefit from skilled physical therapy services to address these deficits to improve level of function with ADLs, functional mobility tasks, and reduce risk for falls.    OBJECTIVE IMPAIRMENTS: Abnormal gait, decreased activity tolerance, dizziness, and postural dysfunction.   ACTIVITY LIMITATIONS: sleeping and bed mobility  PARTICIPATION LIMITATIONS: getting out of bed  PERSONAL FACTORS: multiple orthopedic issues are also affecting patient's functional outcome.   REHAB POTENTIAL: Good  CLINICAL DECISION MAKING: Evolving/moderate complexity  EVALUATION COMPLEXITY: Moderate   PLAN:  PT FREQUENCY: 1x/week  PT DURATION: 4 weeks  PLANNED INTERVENTIONS: 97164- PT Re-evaluation, 97110-Therapeutic exercises, 97530- Therapeutic activity, 97112- Neuromuscular re-education, 97535- Self Care, 16109- Manual therapy, [phone]- Gait training, [phone]- Orthotic  Fit/training, [phone]- Canalith repositioning, [id number]- Aquatic Therapy, [phone]- Splinting, Patient/Family education, Balance training, Stair training, Taping, Dry Needling, Joint mobilization, Joint manipulation, Spinal manipulation, Spinal mobilization, Scar mobilization, and DME instructions.   PLAN FOR NEXT SESSION: discharge  8:35 AM, 08/24/23 Atia Haupt Small Claudius Mich MPT [facility] [place] [phone]

## 2023-08-26 ENCOUNTER — Other Ambulatory Visit: Payer: Self-pay | Admitting: Family Medicine

## 2023-08-26 DIAGNOSIS — I10 Essential (primary) hypertension: Secondary | ICD-10-CM

## 2023-08-30 ENCOUNTER — Encounter (HOSPITAL_COMMUNITY)

## 2023-09-01 ENCOUNTER — Encounter: Payer: Self-pay | Admitting: Urology

## 2023-09-01 ENCOUNTER — Ambulatory Visit: Payer: Medicare Other | Admitting: Urology

## 2023-09-01 VITALS — BP 124/61 | HR 80

## 2023-09-01 DIAGNOSIS — N3941 Urge incontinence: Secondary | ICD-10-CM

## 2023-09-01 DIAGNOSIS — R3129 Other microscopic hematuria: Secondary | ICD-10-CM | POA: Diagnosis not present

## 2023-09-01 DIAGNOSIS — N5201 Erectile dysfunction due to arterial insufficiency: Secondary | ICD-10-CM

## 2023-09-01 DIAGNOSIS — E291 Testicular hypofunction: Secondary | ICD-10-CM

## 2023-09-01 DIAGNOSIS — Z8546 Personal history of malignant neoplasm of prostate: Secondary | ICD-10-CM

## 2023-09-01 LAB — URINALYSIS, ROUTINE W REFLEX MICROSCOPIC
Bilirubin, UA: NEGATIVE
Glucose, UA: NEGATIVE
Ketones, UA: NEGATIVE
Leukocytes,UA: NEGATIVE
Nitrite, UA: NEGATIVE
Protein,UA: NEGATIVE
RBC, UA: NEGATIVE
Specific Gravity, UA: 1.015 (ref 1.005–1.030)
Urobilinogen, Ur: 0.2 mg/dL (ref 0.2–1.0)
pH, UA: 6 (ref 5.0–7.5)

## 2023-09-01 NOTE — Progress Notes (Signed)
 Subjective:  1. Hypogonadism male   2. Urge incontinence   3. Microhematuria   4. History of prostate cancer   5. Erectile dysfunction due to arterial insufficiency       [name] [name] returns today in f/u for his history of hypogonadism and prostate cancer. He had a left THA on 05/06/22 and a right THA in 6/24 at [facility] and is still rehabing and is doing well.   He is s/p a RALP on 04/20/11. His PSA is <0.1. He had Gleason 7(3+4) T2c N0 disease.   He has MUI .   He rarely wear pads.  He has no SUI but continues to have UUI but that is rare if he is careful and has noted that once he gets the urge he can only hold it q15-20 min.  He was given Gemtesa   but is off because of cost.   He has no hematuria or dysuria. He has a good stream. His IPSS is 7.  UA has trace blood but is otherwise clear today.  He had a negative hematuria w/u in 9/23.   He has had some erectile dysfunction that he manages with sildenafil  20mg  but he hasn't been active with his orthopedic issues.   He was on compounded testosterone  4% 3 clicks daily but last used it 6 months and and hasn't noticed much difference.    He is doing well with good energy level.     Hgb is 14.3, T is stable at 280 and the PSA is undetectible.    IPSS     Row Name 09/01/23 1300         International Prostate Symptom Score   How often have you had the sensation of not emptying your bladder? Not at All     How often have you had to urinate less than every two hours? Less than 1 in 5 times     How often have you found you stopped and started again several times when you urinated? Not at All     How often have you found it difficult to postpone urination? More than half the time     How often have you had a weak urinary stream? Not at All     How often have you had to strain to start urination? Not at All     How many times did you typically get up at night to urinate? 2 Times     Total IPSS Score 7       Quality of Life due to urinary  symptoms   If you were to spend the rest of your life with your urinary condition just the way it is now how would you feel about that? Mostly Satisfied           ROS:  ROS:  A complete review of systems was performed.  All systems are negative except for pertinent findings as noted.   Review of Systems  Musculoskeletal:  Positive for joint pain.  Psychiatric/Behavioral:  Positive for depression. The patient is nervous/anxious.   All other systems reviewed and are negative.   Allergies  Allergen Reactions   Citalopram      Fatigue headache    Lisinopril  Cough    Dry cough    Outpatient Encounter Medications as of 09/01/2023  Medication Sig   allopurinol  (ZYLOPRIM ) 100 MG tablet TAKE 1 TABLET BY MOUTH DAILY   amLODipine  (NORVASC ) 5 MG tablet TAKE 1 TABLET BY MOUTH DAILY   aspirin  81 MG chewable  tablet Chew 81 mg by mouth daily.    atorvastatin  (LIPITOR) 40 MG tablet TAKE 1 TABLET BY MOUTH DAILY   B Complex-C (SUPER B COMPLEX PO) Take 1 tablet by mouth daily.   buPROPion  (WELLBUTRIN  XL) 150 MG 24 hr tablet TAKE 1 TABLET BY MOUTH DAILY  STOP CITALOPRAM    ezetimibe  (ZETIA ) 10 MG tablet Take 1 tablet (10 mg total) by mouth daily.   ferrous sulfate 325 (65 FE) MG tablet Take by mouth.   Multiple Vitamin (MULTIVITAMIN) tablet Take 1 tablet by mouth daily.   [DISCONTINUED] ketoconazole  (NIZORAL ) 2 % cream Apply 1 Application topically 2 (two) times daily. (Patient not taking: Reported on 09/01/2023)   [DISCONTINUED] meclizine  (ANTIVERT ) 12.5 MG tablet Take 1 tablet (12.5 mg total) by mouth 3 (three) times daily as needed for dizziness. (Patient not taking: Reported on 09/01/2023)   [DISCONTINUED] Testosterone  POWD Testosterone  4% cream, 3 clicks (0.75ml) topically qam (Patient not taking: Reported on 09/01/2023)   No facility-administered encounter medications on file as of 09/01/2023.    Past Medical History:  Diagnosis Date   Aortic atherosclerosis (HCC) 10/19/2016    Seen on  chest x-ray July 2018   Arthritis    right knee and hip    Cancer [facility])    prostate cancer    Chronic kidney disease    prostate cancer    Diabetes mellitus without complication (HCC)    GERD (gastroesophageal reflux disease)    Gout    Gout    Hypercholesteremia    Hyperglycemia    Hyperlipidemia    Hypertension    Sleep apnea    Status post THR (total hip replacement) 04/20/13 05/01/2013   Right total hip 04/20/2013 Dr. Asuncion Layer implant     Past Surgical History:  Procedure Laterality Date   COLONOSCOPY     COLONOSCOPY N/A 10/22/2013   Procedure: COLONOSCOPY;  Surgeon: Alyce Jubilee, MD;  [facility];  Service: Endoscopy;  Laterality: N/A;  1115   HERNIA REPAIR     right inguinal hernia repair    LUMBAR LAMINECTOMY/DECOMPRESSION MICRODISCECTOMY Right 03/16/2021   Procedure: Right Lumbar four-five Laminectomy/foraminotomy;  Surgeon: Isadora Mar, MD;  Location: [facility] OR;  Service: Neurosurgery;  Laterality: Right;   NASAL SEPTUM SURGERY     OTHER SURGICAL HISTORY     deviated septum surgery    ROBOT ASSISTED LAPAROSCOPIC RADICAL PROSTATECTOMY  04/20/2011   Procedure: ROBOTIC ASSISTED LAPAROSCOPIC RADICAL PROSTATECTOMY;  Surgeon: Willye Harvey, MD;  Location: [facility];  Service: Urology;  Laterality: N/A;  Bilateral lymph node dissection   TOTAL HIP ARTHROPLASTY Right 04/20/2013   Procedure: TOTAL HIP ARTHROPLASTY;  Surgeon: Darrin Emerald, MD;  Location: [facility];  Service: Orthopedics;  Laterality: Right;   TOTAL KNEE ARTHROPLASTY Right 05/16/2018   Procedure: TOTAL KNEE ARTHROPLASTY;  Surgeon: Darrin Emerald, MD;  Location: [facility];  Service: Orthopedics;  Laterality: Right;    Social History   Socioeconomic History   Marital status: Single    Spouse name: Not on file   Number of children: Not on file   Years of education: Not on file   Highest education level: Bachelor's degree (e.g., BA, AB, BS)  Occupational History   Not on file  Tobacco Use    Smoking status: Former    Current packs/day: 0.00    Average packs/day: 0.5 packs/day for 8.0 years (4.0 ttl pk-yrs)    Types: Cigarettes    Start date: 03/22/1976    Quit  date: 03/22/1984    Years since quitting: 39.4   Smokeless tobacco: Never  Vaping Use   Vaping status: Never Used  Substance and Sexual Activity   Alcohol use: Not Currently    Alcohol/week: 1.0 standard drink of alcohol    Types: 1 Cans of beer per week    Comment: occasional    Drug use: No   Sexual activity: Yes    Birth control/protection: None  Other Topics Concern   Not on file  Social History Narrative   Not on file   Social Drivers of Health   Financial Resource Strain: Medium Risk (03/02/2023)   Overall Financial Resource Strain (CARDIA)    Difficulty of Paying Living Expenses: Somewhat hard  Food Insecurity: No Food Insecurity (03/02/2023)   Hunger Vital Sign    Worried About Running Out of Food in the Last Year: Never true    Ran Out of Food in the Last Year: Never true  Transportation Needs: No Transportation Needs (03/02/2023)   PRAPARE - Administrator, Civil Service (Medical): No    Lack of Transportation (Non-Medical): No  Physical Activity: Insufficiently Active (03/02/2023)   Exercise Vital Sign    Days of Exercise per Week: 2 days    Minutes of Exercise per Session: 10 min  Stress: Stress Concern Present (03/02/2023)   [employer] of Occupational Health - Occupational Stress Questionnaire    Feeling of Stress : Very much  Social Connections: Socially Isolated (03/02/2023)   Social Connection and Isolation Panel    Frequency of Communication with Friends and Family: Once a week    Frequency of Social Gatherings with Friends and Family: Never    Attends Religious Services: Never    Database administrator or Organizations: No    Attends Banker Meetings: Never    Marital Status: Living with partner  Intimate Partner Violence: Not At Risk (12/31/2022)    Humiliation, Afraid, Rape, and Kick questionnaire    Fear of Current or Ex-Partner: No    Emotionally Abused: No    Physically Abused: No    Sexually Abused: No    Family History  Problem Relation Age of Onset   Diabetes Mother    Hypertension Mother    Hypertension Brother        Objective: BP 124/61   Pulse 80     Physical Exam Vitals reviewed.  Constitutional:      Appearance: Normal appearance.   Neurological:     Mental Status: He is alert.     Lab Results:  Results for orders placed or performed in visit on 09/01/23 (from the past 24 hours)  Urinalysis, Routine w reflex microscopic     Status: None   Collection Time: 09/01/23  1:03 PM  Result Value Ref Range   Specific Gravity, UA 1.015 1.005 - 1.030   pH, UA 6.0 5.0 - 7.5   Color, UA Yellow Yellow   Appearance Ur Clear Clear   Leukocytes,UA Negative Negative   Protein,UA Negative Negative/Trace   Glucose, UA Negative Negative   Ketones, UA Negative Negative   RBC, UA Negative Negative   Bilirubin, UA Negative Negative   Urobilinogen, Ur 0.2 0.2 - 1.0 mg/dL   Nitrite, UA Negative Negative   Microscopic Examination Comment    Narrative   Performed at:  [address] - [facility] [address], [place], [place]  [id number] Lab Director: Liliana Regulus MT, Phone:  [phone]       UA  has 0-2 RBC's.     BMET No results for input(s): NA, K, CL, CO2, GLUCOSE, BUN, CREATININE, CALCIUM  in the last 72 hours. PSA PSA  Date Value Ref Range Status  05/09/2019 <0.1 < OR = 4.0 ng/mL Final    Comment:    The total PSA value from this assay system is  standardized against the WHO standard. The test  result will be approximately 20% lower when compared  to the equimolar-standardized total PSA (Beckman  Coulter). Comparison of serial PSA results should be  interpreted with this fact in mind. . This test was performed using the Siemens  chemiluminescent method. Values obtained from   different assay methods cannot be used interchangeably. PSA levels, regardless of value, should not be interpreted as absolute evidence of the presence or absence of disease.    Testosterone   Date Value Ref Range Status  08/23/2023 280 264 - 916 ng/dL Final    Comment:    Adult male reference interval is based on a population of healthy nonobese males (BMI <30) between 82 and 70 years old. Travison, et.al. JCEM [phone]. PMID: 91478295.   11/25/2022 265 264 - 916 ng/dL Final    Comment:    Adult male reference interval is based on a population of healthy nonobese males (BMI <30) between 76 and 56 years old. Travison, et.al. JCEM [phone]. PMID: [id number].   05/20/2022 261 (L) 264 - 916 ng/dL Final    Comment:    Adult male reference interval is based on a population of healthy nonobese males (BMI <30) between 77 and 38 years old. Travison, et.al. JCEM [phone]. PMID: [id number].      UA is clear.  Results for orders placed or performed in visit on 09/01/23 (from the past 24 hours)  Urinalysis, Routine w reflex microscopic     Status: None   Collection Time: 09/01/23  1:03 PM  Result Value Ref Range   Specific Gravity, UA 1.015 1.005 - 1.030   pH, UA 6.0 5.0 - 7.5   Color, UA Yellow Yellow   Appearance Ur Clear Clear   Leukocytes,UA Negative Negative   Protein,UA Negative Negative/Trace   Glucose, UA Negative Negative   Ketones, UA Negative Negative   RBC, UA Negative Negative   Bilirubin, UA Negative Negative   Urobilinogen, Ur 0.2 0.2 - 1.0 mg/dL   Nitrite, UA Negative Negative   Microscopic Examination Comment    Narrative   Performed at:  [address] - [facility] [address], [place], [place]  [id number] Lab Director: Liliana Regulus MT, Phone:  [phone]       UA is clear.    Studies/Results: No results found.    Assessment & Plan: Hypogonadism.  He is doing well off of therapy.    ED.  Sildenafil  remains  effective but he has not been active.   Hx of prostate cancer.  PSA has remained undetectible.  Urgency with UUI and nocturia.   He is doing ok off meds.   No orders of the defined types were placed in this encounter.     Orders Placed This Encounter  Procedures   Urinalysis, Routine w reflex microscopic      Return if symptoms worsen or fail to improve, for Should have an annual PSA wtih Dr. Fairy Homer. .   CC: Bennet Brasil, MD      Homero Luster 09/02/2023 Patient ID: Rush Coupe, male   DOB: 24-Sep-1950, 73 y.o.   MRN: [id number]

## 2023-09-05 ENCOUNTER — Ambulatory Visit: Admitting: Podiatry

## 2023-09-05 ENCOUNTER — Encounter: Payer: Self-pay | Admitting: Podiatry

## 2023-09-05 VITALS — Ht 68.0 in | Wt 195.0 lb

## 2023-09-05 DIAGNOSIS — L600 Ingrowing nail: Secondary | ICD-10-CM

## 2023-09-07 NOTE — Progress Notes (Signed)
 Subjective:   Patient ID: [name] [name], male   DOB: 73 y.o.   MRN: [id number]   HPI Patient states he is starting to gets quite a bit of irritation in the right big toenail and the borders and that they have been sore and hard to cut.  States he is seeing a cardiologist for circulatory studies in the next few weeks and patient does not smoke and tries to be active   Review of Systems  All other systems reviewed and are negative.       Objective:  Physical Exam Vitals and nursing note reviewed.  Constitutional:      Appearance: He is well-developed.  Pulmonary:     Effort: Pulmonary effort is normal.   Musculoskeletal:        General: Normal range of motion.   Skin:    General: Skin is warm.   Neurological:     Mental Status: He is alert.     Neuro status intact with diminishment of pulses DP PT.  When questioned about claudication symptoms does not seem to indicate significant claudication muscle strength found to be adequate range of motion was adequate.  Patient is noted to have significant incurvation of the hallux nail right both medial and lateral borders painful when pressed no erythema edema drainage     Assessment:  Ingrown toenail deformity right hallux but good chance of moderate vascular disease and I would like him to be evaluated before I would consider any kind of correction     Plan:  H&P reviewed and I carefully debrided out the nail borders right tolerated well and I advised on soaks and will be seen back for us  to reevaluate as needed and may require something more aggressive like

## 2023-09-14 DIAGNOSIS — E041 Nontoxic single thyroid nodule: Secondary | ICD-10-CM | POA: Diagnosis not present

## 2023-09-19 ENCOUNTER — Other Ambulatory Visit: Payer: Self-pay | Admitting: Family Medicine

## 2023-09-21 ENCOUNTER — Other Ambulatory Visit: Payer: Self-pay | Admitting: *Deleted

## 2023-09-21 ENCOUNTER — Encounter: Payer: Self-pay | Admitting: Internal Medicine

## 2023-09-21 ENCOUNTER — Encounter: Payer: Self-pay | Admitting: *Deleted

## 2023-09-21 ENCOUNTER — Ambulatory Visit (INDEPENDENT_AMBULATORY_CARE_PROVIDER_SITE_OTHER): Admitting: Internal Medicine

## 2023-09-21 VITALS — BP 129/79 | HR 73 | Temp 98.6°F | Ht 68.0 in | Wt 194.4 lb

## 2023-09-21 DIAGNOSIS — D500 Iron deficiency anemia secondary to blood loss (chronic): Secondary | ICD-10-CM | POA: Diagnosis not present

## 2023-09-21 DIAGNOSIS — Z09 Encounter for follow-up examination after completed treatment for conditions other than malignant neoplasm: Secondary | ICD-10-CM | POA: Diagnosis not present

## 2023-09-21 DIAGNOSIS — Z8711 Personal history of peptic ulcer disease: Secondary | ICD-10-CM | POA: Diagnosis not present

## 2023-09-21 DIAGNOSIS — K274 Chronic or unspecified peptic ulcer, site unspecified, with hemorrhage: Secondary | ICD-10-CM

## 2023-09-21 DIAGNOSIS — Z1211 Encounter for screening for malignant neoplasm of colon: Secondary | ICD-10-CM

## 2023-09-21 MED ORDER — PEG 3350-KCL-NA BICARB-NACL 420 G PO SOLR: 4000.0000 mL | Freq: Once | ORAL | 0 refills | Status: AC

## 2023-09-21 NOTE — H&P (View-Only) (Signed)
 Primary Care Physician:  Alphonsa Glendia LABOR, MD Primary Gastroenterologist:  Dr. [name]  Chief Complaint  Patient presents with   Follow-up    Patient here today for a follow up on PUD. Patient says he has been seeing coffee like grounds in stools. This is happening once or twice per week. Denies any sight of bright red blood in stools. Denies any shortness of breath, fatigue, chest pain, no palpitations.. He has episode of vertigo, which has subsided.    HPI:   [name] [name] is a 73 y.o. male who presents to clinic today for follow up visit.  He has a history of hypertension, sleep apnea, prostate cancer.  Admitted to [facility] 09/25/2022 after initially presenting with maroon-colored stools, acute blood loss anemia.  Initial hemoglobin 4.7 responded to PRBCs.  Recent hip arthroplasty 09/10/2022, discharged on aspirin  prophylaxis.  Also history of taking Mobic .  EGD 09/27/2022, normal esophagus, normal stomach, 3 small superficial benign-appearing ulcers in the duodenal bulb, clean-based.  Gastric biopsies negative for H. pylori.  H. pylori stool negative.  Placed on twice daily PPI and discharged home.  Repeat hemoglobin 10/26/22 was improved at 11.8.  Continues to take oral iron.  Most recent hemoglobin 14.3 on 08/23/2023.  Today, patient states he is doing well.  Denies any melena hematochezia.  Appetite is good.  Due for screening colonoscopy, last colonoscopy 10/22/2013 unremarkable besides pancolonic diverticulosis and small internal hemorrhoids.  Past Medical History:  Diagnosis Date   Aortic atherosclerosis (HCC) 10/19/2016    Seen on chest x-ray July 2018   Arthritis    right knee and hip    Cancer [facility])    prostate cancer    Chronic kidney disease    prostate cancer    Diabetes mellitus without complication (HCC)    GERD (gastroesophageal reflux disease)    Gout    Gout    Hypercholesteremia    Hyperglycemia    Hyperlipidemia    Hypertension    Sleep apnea     Status post THR (total hip replacement) 04/20/13 05/01/2013   Right total hip 04/20/2013 Dr. Margrette Eric implant     Past Surgical History:  Procedure Laterality Date   COLONOSCOPY     COLONOSCOPY N/A 10/22/2013   Procedure: COLONOSCOPY;  Surgeon: Margo LITTIE Haddock, MD;  Location: [facility];  Service: Endoscopy;  Laterality: N/A;  1115   HERNIA REPAIR     right inguinal hernia repair    LUMBAR LAMINECTOMY/DECOMPRESSION MICRODISCECTOMY Right 03/16/2021   Procedure: Right Lumbar four-five Laminectomy/foraminotomy;  Surgeon: Joshua Alm RAMAN, MD;  Location: [facility] OR;  Service: Neurosurgery;  Laterality: Right;   NASAL SEPTUM SURGERY     OTHER SURGICAL HISTORY     deviated septum surgery    ROBOT ASSISTED LAPAROSCOPIC RADICAL PROSTATECTOMY  04/20/2011   Procedure: ROBOTIC ASSISTED LAPAROSCOPIC RADICAL PROSTATECTOMY;  Surgeon: Norleen JINNY Seltzer, MD;  Location: [facility];  Service: Urology;  Laterality: N/A;  Bilateral lymph node dissection   TOTAL HIP ARTHROPLASTY Right 04/20/2013   Procedure: TOTAL HIP ARTHROPLASTY;  Surgeon: Taft FORBES Margrette, MD;  Location: [facility];  Service: Orthopedics;  Laterality: Right;   TOTAL KNEE ARTHROPLASTY Right 05/16/2018   Procedure: TOTAL KNEE ARTHROPLASTY;  Surgeon: Margrette Taft FORBES, MD;  Location: [facility];  Service: Orthopedics;  Laterality: Right;    Current Outpatient Medications  Medication Sig Dispense Refill   allopurinol  (ZYLOPRIM ) 100 MG tablet TAKE 1 TABLET BY MOUTH DAILY 100 tablet 2   amLODipine  (  NORVASC ) 5 MG tablet TAKE 1 TABLET BY MOUTH DAILY 100 tablet 2   aspirin  81 MG chewable tablet Chew 81 mg by mouth daily.      atorvastatin  (LIPITOR) 40 MG tablet TAKE 1 TABLET BY MOUTH DAILY 100 tablet 2   B Complex-C (SUPER B COMPLEX PO) Take 1 tablet by mouth daily.     buPROPion  (WELLBUTRIN  XL) 150 MG 24 hr tablet TAKE 1 TABLET BY MOUTH DAILY  STOP CITALOPRAM  90 tablet 3   ezetimibe  (ZETIA ) 10 MG tablet Take 1 tablet (10 mg total) by mouth daily. 90 tablet 3    ferrous sulfate 325 (65 FE) MG tablet Take by mouth. (Patient taking differently: Take 325 mg by mouth daily with breakfast.)     Multiple Vitamin (MULTIVITAMIN) tablet Take 1 tablet by mouth daily.     OVER THE COUNTER MEDICATION Otc Vit D daily  Systane eye drops three times per day.     No current facility-administered medications for this visit.    Allergies as of 09/21/2023 - Review Complete 09/21/2023  Allergen Reaction Noted   Citalopram   11/01/2019   Lisinopril  Cough 07/19/2018    Family History  Problem Relation Age of Onset   Diabetes Mother    Hypertension Mother    Hypertension Brother     Social History   Socioeconomic History   Marital status: Single    Spouse name: Not on file   Number of children: Not on file   Years of education: Not on file   Highest education level: Bachelor's degree (e.g., BA, AB, BS)  Occupational History   Not on file  Tobacco Use   Smoking status: Former    Current packs/day: 0.00    Average packs/day: 0.5 packs/day for 8.0 years (4.0 ttl pk-yrs)    Types: Cigarettes    Start date: 03/22/1976    Quit date: 03/22/1984    Years since quitting: 39.5   Smokeless tobacco: Never  Vaping Use   Vaping status: Never Used  Substance and Sexual Activity   Alcohol use: Not Currently    Alcohol/week: 1.0 standard drink of alcohol    Types: 1 Cans of beer per week    Comment: occasional    Drug use: No   Sexual activity: Yes    Birth control/protection: None  Other Topics Concern   Not on file  Social History Narrative   Not on file   Social Drivers of Health   Financial Resource Strain: Medium Risk (03/02/2023)   Overall Financial Resource Strain (CARDIA)    Difficulty of Paying Living Expenses: Somewhat hard  Food Insecurity: No Food Insecurity (03/02/2023)   Hunger Vital Sign    Worried About Running Out of Food in the Last Year: Never true    Ran Out of Food in the Last Year: Never true  Transportation Needs: No  Transportation Needs (03/02/2023)   PRAPARE - Administrator, Civil Service (Medical): No    Lack of Transportation (Non-Medical): No  Physical Activity: Insufficiently Active (03/02/2023)   Exercise Vital Sign    Days of Exercise per Week: 2 days    Minutes of Exercise per Session: 10 min  Stress: Stress Concern Present (03/02/2023)   [employer] of Occupational Health - Occupational Stress Questionnaire    Feeling of Stress : Very much  Social Connections: Socially Isolated (03/02/2023)   Social Connection and Isolation Panel    Frequency of Communication with Friends and Family: Once a week  Frequency of Social Gatherings with Friends and Family: Never    Attends Religious Services: Never    Database administrator or Organizations: No    Attends Banker Meetings: Never    Marital Status: Living with partner  Intimate Partner Violence: Not At Risk (12/31/2022)   Humiliation, Afraid, Rape, and Kick questionnaire    Fear of Current or Ex-Partner: No    Emotionally Abused: No    Physically Abused: No    Sexually Abused: No    Subjective: Review of Systems  Constitutional:  Negative for chills and fever.  HENT:  Negative for congestion and hearing loss.   Eyes:  Negative for blurred vision and double vision.  Respiratory:  Negative for cough and shortness of breath.   Cardiovascular:  Negative for chest pain and palpitations.  Gastrointestinal:  Negative for abdominal pain, blood in stool, constipation, diarrhea, heartburn, melena and vomiting.  Genitourinary:  Negative for dysuria and urgency.  Musculoskeletal:  Negative for joint pain and myalgias.  Skin:  Negative for itching and rash.  Neurological:  Negative for dizziness and headaches.  Psychiatric/Behavioral:  Negative for depression. The patient is not nervous/anxious.        Objective: BP 129/79 (BP Location: Left Arm, Patient Position: Sitting, Cuff Size: Normal)   Pulse 73   Temp  98.6 F (37 C) (Temporal)   Ht 5' 8 (1.727 m)   Wt 194 lb 6.4 oz (88.2 kg)   BMI 29.56 kg/m  Physical Exam Constitutional:      Appearance: Normal appearance.  HENT:     Head: Normocephalic and atraumatic.  Eyes:     Extraocular Movements: Extraocular movements intact.     Conjunctiva/sclera: Conjunctivae normal.  Cardiovascular:     Rate and Rhythm: Normal rate and regular rhythm.  Pulmonary:     Effort: Pulmonary effort is normal.     Breath sounds: Normal breath sounds.  Abdominal:     General: Bowel sounds are normal.     Palpations: Abdomen is soft.  Musculoskeletal:        General: Normal range of motion.     Cervical back: Normal range of motion and neck supple.  Skin:    General: Skin is warm.  Neurological:     General: No focal deficit present.     Mental Status: He is alert and oriented to person, place, and time.  Psychiatric:        Mood and Affect: Mood normal.        Behavior: Behavior normal.      Assessment: *History of peptic ulcer disease with hemorrhage *Iron deficiency anemia due to above-resolved *Colon cancer screening  Plan: Patient appears to be doing very well in office today.  Hemoglobin normalized.  Ulcers likely NSAID induced in the setting of aspirin  and previous Mobic  use.  Continue to avoid NSAIDs.  Check iron studies today, if normal can likely DC oral iron.  Will schedule for screening colonoscopy.The risks including infection, bleed, or perforation as well as benefits, limitations, alternatives and imponderables have been reviewed with the patient. Questions have been answered. All parties agreeable.    09/21/2023 10:45 AM   Disclaimer: This note was dictated with voice recognition software. Similar sounding words can inadvertently be transcribed and may not be corrected upon review.

## 2023-09-21 NOTE — Progress Notes (Signed)
 Primary Care Physician:  Alphonsa Glendia LABOR, MD Primary Gastroenterologist:  Dr. [name]  Chief Complaint  Patient presents with   Follow-up    Patient here today for a follow up on PUD. Patient says he has been seeing coffee like grounds in stools. This is happening once or twice per week. Denies any sight of bright red blood in stools. Denies any shortness of breath, fatigue, chest pain, no palpitations.. He has episode of vertigo, which has subsided.    HPI:   [name] [name] is a 73 y.o. male who presents to clinic today for follow up visit.  He has a history of hypertension, sleep apnea, prostate cancer.  Admitted to [facility] 09/25/2022 after initially presenting with maroon-colored stools, acute blood loss anemia.  Initial hemoglobin 4.7 responded to PRBCs.  Recent hip arthroplasty 09/10/2022, discharged on aspirin  prophylaxis.  Also history of taking Mobic .  EGD 09/27/2022, normal esophagus, normal stomach, 3 small superficial benign-appearing ulcers in the duodenal bulb, clean-based.  Gastric biopsies negative for H. pylori.  H. pylori stool negative.  Placed on twice daily PPI and discharged home.  Repeat hemoglobin 10/26/22 was improved at 11.8.  Continues to take oral iron.  Most recent hemoglobin 14.3 on 08/23/2023.  Today, patient states he is doing well.  Denies any melena hematochezia.  Appetite is good.  Due for screening colonoscopy, last colonoscopy 10/22/2013 unremarkable besides pancolonic diverticulosis and small internal hemorrhoids.  Past Medical History:  Diagnosis Date   Aortic atherosclerosis (HCC) 10/19/2016    Seen on chest x-ray July 2018   Arthritis    right knee and hip    Cancer [facility])    prostate cancer    Chronic kidney disease    prostate cancer    Diabetes mellitus without complication (HCC)    GERD (gastroesophageal reflux disease)    Gout    Gout    Hypercholesteremia    Hyperglycemia    Hyperlipidemia    Hypertension    Sleep apnea     Status post THR (total hip replacement) 04/20/13 05/01/2013   Right total hip 04/20/2013 Dr. Margrette Eric implant     Past Surgical History:  Procedure Laterality Date   COLONOSCOPY     COLONOSCOPY N/A 10/22/2013   Procedure: COLONOSCOPY;  Surgeon: Margo LITTIE Haddock, MD;  Location: [facility];  Service: Endoscopy;  Laterality: N/A;  1115   HERNIA REPAIR     right inguinal hernia repair    LUMBAR LAMINECTOMY/DECOMPRESSION MICRODISCECTOMY Right 03/16/2021   Procedure: Right Lumbar four-five Laminectomy/foraminotomy;  Surgeon: Joshua Alm RAMAN, MD;  Location: [facility] OR;  Service: Neurosurgery;  Laterality: Right;   NASAL SEPTUM SURGERY     OTHER SURGICAL HISTORY     deviated septum surgery    ROBOT ASSISTED LAPAROSCOPIC RADICAL PROSTATECTOMY  04/20/2011   Procedure: ROBOTIC ASSISTED LAPAROSCOPIC RADICAL PROSTATECTOMY;  Surgeon: Norleen JINNY Seltzer, MD;  Location: [facility];  Service: Urology;  Laterality: N/A;  Bilateral lymph node dissection   TOTAL HIP ARTHROPLASTY Right 04/20/2013   Procedure: TOTAL HIP ARTHROPLASTY;  Surgeon: Taft FORBES Margrette, MD;  Location: [facility];  Service: Orthopedics;  Laterality: Right;   TOTAL KNEE ARTHROPLASTY Right 05/16/2018   Procedure: TOTAL KNEE ARTHROPLASTY;  Surgeon: Margrette Taft FORBES, MD;  Location: [facility];  Service: Orthopedics;  Laterality: Right;    Current Outpatient Medications  Medication Sig Dispense Refill   allopurinol  (ZYLOPRIM ) 100 MG tablet TAKE 1 TABLET BY MOUTH DAILY 100 tablet 2   amLODipine  (  NORVASC ) 5 MG tablet TAKE 1 TABLET BY MOUTH DAILY 100 tablet 2   aspirin  81 MG chewable tablet Chew 81 mg by mouth daily.      atorvastatin  (LIPITOR) 40 MG tablet TAKE 1 TABLET BY MOUTH DAILY 100 tablet 2   B Complex-C (SUPER B COMPLEX PO) Take 1 tablet by mouth daily.     buPROPion  (WELLBUTRIN  XL) 150 MG 24 hr tablet TAKE 1 TABLET BY MOUTH DAILY  STOP CITALOPRAM  90 tablet 3   ezetimibe  (ZETIA ) 10 MG tablet Take 1 tablet (10 mg total) by mouth daily. 90 tablet 3    ferrous sulfate 325 (65 FE) MG tablet Take by mouth. (Patient taking differently: Take 325 mg by mouth daily with breakfast.)     Multiple Vitamin (MULTIVITAMIN) tablet Take 1 tablet by mouth daily.     OVER THE COUNTER MEDICATION Otc Vit D daily  Systane eye drops three times per day.     No current facility-administered medications for this visit.    Allergies as of 09/21/2023 - Review Complete 09/21/2023  Allergen Reaction Noted   Citalopram   11/01/2019   Lisinopril  Cough 07/19/2018    Family History  Problem Relation Age of Onset   Diabetes Mother    Hypertension Mother    Hypertension Brother     Social History   Socioeconomic History   Marital status: Single    Spouse name: Not on file   Number of children: Not on file   Years of education: Not on file   Highest education level: Bachelor's degree (e.g., BA, AB, BS)  Occupational History   Not on file  Tobacco Use   Smoking status: Former    Current packs/day: 0.00    Average packs/day: 0.5 packs/day for 8.0 years (4.0 ttl pk-yrs)    Types: Cigarettes    Start date: 03/22/1976    Quit date: 03/22/1984    Years since quitting: 39.5   Smokeless tobacco: Never  Vaping Use   Vaping status: Never Used  Substance and Sexual Activity   Alcohol use: Not Currently    Alcohol/week: 1.0 standard drink of alcohol    Types: 1 Cans of beer per week    Comment: occasional    Drug use: No   Sexual activity: Yes    Birth control/protection: None  Other Topics Concern   Not on file  Social History Narrative   Not on file   Social Drivers of Health   Financial Resource Strain: Medium Risk (03/02/2023)   Overall Financial Resource Strain (CARDIA)    Difficulty of Paying Living Expenses: Somewhat hard  Food Insecurity: No Food Insecurity (03/02/2023)   Hunger Vital Sign    Worried About Running Out of Food in the Last Year: Never true    Ran Out of Food in the Last Year: Never true  Transportation Needs: No  Transportation Needs (03/02/2023)   PRAPARE - Administrator, Civil Service (Medical): No    Lack of Transportation (Non-Medical): No  Physical Activity: Insufficiently Active (03/02/2023)   Exercise Vital Sign    Days of Exercise per Week: 2 days    Minutes of Exercise per Session: 10 min  Stress: Stress Concern Present (03/02/2023)   [employer] of Occupational Health - Occupational Stress Questionnaire    Feeling of Stress : Very much  Social Connections: Socially Isolated (03/02/2023)   Social Connection and Isolation Panel    Frequency of Communication with Friends and Family: Once a week  Frequency of Social Gatherings with Friends and Family: Never    Attends Religious Services: Never    Database administrator or Organizations: No    Attends Banker Meetings: Never    Marital Status: Living with partner  Intimate Partner Violence: Not At Risk (12/31/2022)   Humiliation, Afraid, Rape, and Kick questionnaire    Fear of Current or Ex-Partner: No    Emotionally Abused: No    Physically Abused: No    Sexually Abused: No    Subjective: Review of Systems  Constitutional:  Negative for chills and fever.  HENT:  Negative for congestion and hearing loss.   Eyes:  Negative for blurred vision and double vision.  Respiratory:  Negative for cough and shortness of breath.   Cardiovascular:  Negative for chest pain and palpitations.  Gastrointestinal:  Negative for abdominal pain, blood in stool, constipation, diarrhea, heartburn, melena and vomiting.  Genitourinary:  Negative for dysuria and urgency.  Musculoskeletal:  Negative for joint pain and myalgias.  Skin:  Negative for itching and rash.  Neurological:  Negative for dizziness and headaches.  Psychiatric/Behavioral:  Negative for depression. The patient is not nervous/anxious.        Objective: BP 129/79 (BP Location: Left Arm, Patient Position: Sitting, Cuff Size: Normal)   Pulse 73   Temp  98.6 F (37 C) (Temporal)   Ht 5' 8 (1.727 m)   Wt 194 lb 6.4 oz (88.2 kg)   BMI 29.56 kg/m  Physical Exam Constitutional:      Appearance: Normal appearance.  HENT:     Head: Normocephalic and atraumatic.  Eyes:     Extraocular Movements: Extraocular movements intact.     Conjunctiva/sclera: Conjunctivae normal.  Cardiovascular:     Rate and Rhythm: Normal rate and regular rhythm.  Pulmonary:     Effort: Pulmonary effort is normal.     Breath sounds: Normal breath sounds.  Abdominal:     General: Bowel sounds are normal.     Palpations: Abdomen is soft.  Musculoskeletal:        General: Normal range of motion.     Cervical back: Normal range of motion and neck supple.  Skin:    General: Skin is warm.  Neurological:     General: No focal deficit present.     Mental Status: He is alert and oriented to person, place, and time.  Psychiatric:        Mood and Affect: Mood normal.        Behavior: Behavior normal.      Assessment: *History of peptic ulcer disease with hemorrhage *Iron deficiency anemia due to above-resolved *Colon cancer screening  Plan: Patient appears to be doing very well in office today.  Hemoglobin normalized.  Ulcers likely NSAID induced in the setting of aspirin  and previous Mobic  use.  Continue to avoid NSAIDs.  Check iron studies today, if normal can likely DC oral iron.  Will schedule for screening colonoscopy.The risks including infection, bleed, or perforation as well as benefits, limitations, alternatives and imponderables have been reviewed with the patient. Questions have been answered. All parties agreeable.    09/21/2023 10:45 AM   Disclaimer: This note was dictated with voice recognition software. Similar sounding words can inadvertently be transcribed and may not be corrected upon review.

## 2023-09-21 NOTE — Patient Instructions (Signed)
 We will see you for colonoscopy for colon cancer screening purposes.  I am going to check your iron levels today at [facility] lab.  If normal then we can likely stop your oral iron.  It was very nice seeing you again today.  Dr. [name]

## 2023-09-22 LAB — IRON,TIBC AND FERRITIN PANEL
%SAT: 32 % (ref 20–48)
Ferritin: 49 ng/mL (ref 24–380)
Iron: 103 ug/dL (ref 50–180)
TIBC: 321 ug/dL (ref 250–425)

## 2023-09-26 DIAGNOSIS — E041 Nontoxic single thyroid nodule: Secondary | ICD-10-CM | POA: Diagnosis not present

## 2023-09-29 ENCOUNTER — Encounter (INDEPENDENT_AMBULATORY_CARE_PROVIDER_SITE_OTHER): Payer: Self-pay | Admitting: *Deleted

## 2023-10-10 ENCOUNTER — Encounter (HOSPITAL_COMMUNITY): Payer: Self-pay | Admitting: Internal Medicine

## 2023-10-10 ENCOUNTER — Encounter (HOSPITAL_COMMUNITY)
Admission: RE | Disposition: A | Discharge status: Home / Self Care | Payer: Self-pay | Source: Ambulatory Visit | Attending: Internal Medicine

## 2023-10-10 ENCOUNTER — Other Ambulatory Visit: Payer: Self-pay

## 2023-10-10 ENCOUNTER — Ambulatory Visit (HOSPITAL_COMMUNITY): Admitting: Anesthesiology

## 2023-10-10 ENCOUNTER — Ambulatory Visit (HOSPITAL_COMMUNITY)
Admission: RE | Admit: 2023-10-10 | Discharge: 2023-10-10 | Disposition: A | Discharge status: Home / Self Care | Source: Ambulatory Visit | Attending: Internal Medicine | Admitting: Internal Medicine

## 2023-10-10 DIAGNOSIS — D122 Benign neoplasm of ascending colon: Secondary | ICD-10-CM | POA: Diagnosis not present

## 2023-10-10 DIAGNOSIS — I129 Hypertensive chronic kidney disease with stage 1 through stage 4 chronic kidney disease, or unspecified chronic kidney disease: Secondary | ICD-10-CM | POA: Insufficient documentation

## 2023-10-10 DIAGNOSIS — K648 Other hemorrhoids: Secondary | ICD-10-CM

## 2023-10-10 DIAGNOSIS — D120 Benign neoplasm of cecum: Secondary | ICD-10-CM

## 2023-10-10 DIAGNOSIS — K635 Polyp of colon: Secondary | ICD-10-CM | POA: Diagnosis not present

## 2023-10-10 DIAGNOSIS — I2510 Atherosclerotic heart disease of native coronary artery without angina pectoris: Secondary | ICD-10-CM | POA: Diagnosis not present

## 2023-10-10 DIAGNOSIS — Z139 Encounter for screening, unspecified: Secondary | ICD-10-CM | POA: Diagnosis not present

## 2023-10-10 DIAGNOSIS — N189 Chronic kidney disease, unspecified: Secondary | ICD-10-CM | POA: Diagnosis not present

## 2023-10-10 DIAGNOSIS — Z87891 Personal history of nicotine dependence: Secondary | ICD-10-CM | POA: Insufficient documentation

## 2023-10-10 DIAGNOSIS — G4730 Sleep apnea, unspecified: Secondary | ICD-10-CM | POA: Diagnosis not present

## 2023-10-10 DIAGNOSIS — E1122 Type 2 diabetes mellitus with diabetic chronic kidney disease: Secondary | ICD-10-CM | POA: Diagnosis not present

## 2023-10-10 DIAGNOSIS — Z1211 Encounter for screening for malignant neoplasm of colon: Secondary | ICD-10-CM | POA: Insufficient documentation

## 2023-10-10 DIAGNOSIS — K5730 Diverticulosis of large intestine without perforation or abscess without bleeding: Secondary | ICD-10-CM | POA: Insufficient documentation

## 2023-10-10 DIAGNOSIS — I10 Essential (primary) hypertension: Secondary | ICD-10-CM | POA: Diagnosis not present

## 2023-10-10 DIAGNOSIS — K219 Gastro-esophageal reflux disease without esophagitis: Secondary | ICD-10-CM | POA: Diagnosis not present

## 2023-10-10 DIAGNOSIS — Z8546 Personal history of malignant neoplasm of prostate: Secondary | ICD-10-CM | POA: Insufficient documentation

## 2023-10-10 HISTORY — PX: COLONOSCOPY: SHX5424

## 2023-10-10 SURGERY — COLONOSCOPY
Anesthesia: General

## 2023-10-10 MED ORDER — PROPOFOL 500 MG/50ML IV EMUL
INTRAVENOUS | Status: DC | PRN
  Administered 2023-10-10: 200 ug/kg/min via INTRAVENOUS

## 2023-10-10 MED ORDER — PROPOFOL 10 MG/ML IV BOLUS
INTRAVENOUS | Status: DC | PRN
  Administered 2023-10-10: 100 mg via INTRAVENOUS

## 2023-10-10 MED ORDER — LACTATED RINGERS IV SOLN: INTRAVENOUS | Status: DC

## 2023-10-10 NOTE — Discharge Instructions (Addendum)
  Colonoscopy Discharge Instructions  Read the instructions outlined below and refer to this sheet in the next few weeks. These discharge instructions provide you with general information on caring for yourself after you leave the hospital. Your doctor may also give you specific instructions. While your treatment has been planned according to the most current medical practices available, unavoidable complications occasionally occur.   ACTIVITY You may resume your regular activity, but move at a slower pace for the next 24 hours.  Take frequent rest periods for the next 24 hours.  Walking will help get rid of the air and reduce the bloated feeling in your belly (abdomen).  No driving for 24 hours (because of the medicine (anesthesia) used during the test).   Do not sign any important legal documents or operate any machinery for 24 hours (because of the anesthesia used during the test).  NUTRITION Drink plenty of fluids.  You may resume your normal diet as instructed by your doctor.  Begin with a light meal and progress to your normal diet. Heavy or fried foods are harder to digest and may make you feel sick to your stomach (nauseated).  Avoid alcoholic beverages for 24 hours or as instructed.  MEDICATIONS You may resume your normal medications unless your doctor tells you otherwise.  WHAT YOU CAN EXPECT TODAY Some feelings of bloating in the abdomen.  Passage of more gas than usual.  Spotting of blood in your stool or on the toilet paper.  IF YOU HAD POLYPS REMOVED DURING THE COLONOSCOPY: No aspirin  products for 7 days or as instructed.  No alcohol for 7 days or as instructed.  Eat a soft diet for the next 24 hours.  FINDING OUT THE RESULTS OF YOUR TEST Not all test results are available during your visit. If your test results are not back during the visit, make an appointment with your caregiver to find out the results. Do not assume everything is normal if you have not heard from your  caregiver or the medical facility. It is important for you to follow up on all of your test results.  SEEK IMMEDIATE MEDICAL ATTENTION IF: You have more than a spotting of blood in your stool.  Your belly is swollen (abdominal distention).  You are nauseated or vomiting.  You have a temperature over 101.  You have abdominal pain or discomfort that is severe or gets worse throughout the day.   Your colonoscopy revealed 2 polyp(s) which I removed successfully. Await pathology results, my office will contact you.  Given your age, I do not think you need further colonoscopies for polyp surveillance.  Your most recent iron studies were normal.  I think you can stop your oral iron.  Follow-up with GI as needed.  I hope you have a great rest of your week!  [name] [name]. [name], D.O. Gastroenterology and Hepatology [facility]

## 2023-10-10 NOTE — Anesthesia Preprocedure Evaluation (Signed)
 Anesthesia Evaluation  Patient identified by MRN, date of birth, ID band Patient awake    Reviewed: Allergy & Precautions, H&P , NPO status , Patient's Chart, lab work & pertinent test results, reviewed documented beta blocker date and time   Airway Mallampati: II  TM Distance: >3 FB Neck ROM: full    Dental no notable dental hx.    Pulmonary sleep apnea , former smoker   Pulmonary exam normal breath sounds clear to auscultation       Cardiovascular Exercise Tolerance: Good hypertension, + CAD   Rhythm:regular Rate:Normal     Neuro/Psych negative neurological ROS  negative psych ROS   GI/Hepatic Neg liver ROS, PUD,GERD  ,,  Endo/Other  negative endocrine ROS    Renal/GU Renal disease  negative genitourinary   Musculoskeletal   Abdominal   Peds  Hematology negative hematology ROS (+)   Anesthesia Other Findings   Reproductive/Obstetrics negative OB ROS                              Anesthesia Physical Anesthesia Plan  ASA: 3  Anesthesia Plan: General   Post-op Pain Management:    Induction:   PONV Risk Score and Plan: Propofol  infusion  Airway Management Planned:   Additional Equipment:   Intra-op Plan:   Post-operative Plan:   Informed Consent: I have reviewed the patients History and Physical, chart, labs and discussed the procedure including the risks, benefits and alternatives for the proposed anesthesia with the patient or authorized representative who has indicated his/her understanding and acceptance.     Dental Advisory Given  Plan Discussed with: CRNA  Anesthesia Plan Comments:         Anesthesia Quick Evaluation

## 2023-10-10 NOTE — Op Note (Signed)
 [facility] Patient Name: [name] [name] Procedure Date: 10/10/2023 10:58 AM MRN: [id number] Date of Birth: June 27, 1950 Attending MD: Carlin POUR. [name] , [place], [id number] CSN: [id number] Age: 73 Admit Type: Outpatient Procedure:                Colonoscopy Indications:              Screening for colorectal malignant neoplasm Providers:                Carlin POUR. [name], DO, Emilee Tubb RN, RN, Jon Loge, [place] Wilson, Technician Referring MD:              Medicines:                See the Anesthesia note for documentation of the                            administered medications Complications:            No immediate complications. Estimated Blood Loss:     Estimated blood loss was minimal. Procedure:                Pre-Anesthesia Assessment:                           - The anesthesia plan was to use monitored                            anesthesia care (MAC).                           After obtaining informed consent, the colonoscope                            was passed under direct vision. Throughout the                            procedure, the patient's blood pressure, pulse, and                            oxygen saturations were monitored continuously. The                            [id number]-HQ190L ([id number]) scope was introduced through                            the anus and advanced to the the cecum, identified                            by appendiceal orifice and ileocecal valve. The                            colonoscopy was performed without difficulty. The                            patient tolerated  the procedure well. The quality                            of the bowel preparation was evaluated using the                            BBPS [facility] Bowel Preparation Scale) with scores                            of: Right Colon = 3, Transverse Colon = 3 and Left                            Colon = 3 (entire mucosa seen well with no residual                             staining, small fragments of stool or opaque                            liquid). The total BBPS score equals 9. Scope In: 11:11:48 AM Scope Out: 11:21:26 AM Scope Withdrawal Time: 0 hours 7 minutes 3 seconds  Total Procedure Duration: 0 hours 9 minutes 38 seconds  Findings:      Non-bleeding internal hemorrhoids were found. The hemorrhoids were small.      Scattered medium-mouthed and small-mouthed diverticula were found in the       entire colon.      Two sessile polyps were found in the ascending colon and cecum. The       polyps were 4 to 5 mm in size. These polyps were removed with a cold       snare. Resection and retrieval were complete.      The exam was otherwise without abnormality. Impression:               - Non-bleeding internal hemorrhoids.                           - Diverticulosis in the entire examined colon.                           - Two 4 to 5 mm polyps in the ascending colon and                            in the cecum, removed with a cold snare. Resected                            and retrieved.                           - The examination was otherwise normal. Moderate Sedation:      Per Anesthesia Care Recommendation:           - Patient has a contact number available for                            emergencies. The signs and symptoms of potential  delayed complications were discussed with the                            patient. Return to normal activities tomorrow.                            Written discharge instructions were provided to the                            patient.                           - Resume previous diet.                           - Continue present medications.                           - Await pathology results.                           - No repeat colonoscopy due to age.                           - Return to GI clinic PRN. Procedure Code(s):        --- Professional ---                            [phone], Colonoscopy, flexible; with removal of                            tumor(s), polyp(s), or other lesion(s) by snare                            technique Diagnosis Code(s):        --- Professional ---                           Z12.11, Encounter for screening for malignant                            neoplasm of colon                           K64.8, Other hemorrhoids                           D12.2, Benign neoplasm of ascending colon                           D12.0, Benign neoplasm of cecum                           K57.30, Diverticulosis of large intestine without                            perforation or abscess without bleeding CPT copyright 2022 American Medical Association. All  rights reserved. The codes documented in this report are preliminary and upon coder review may  be revised to meet current compliance requirements. Carlin POUR. [name], DO Carlin POUR. [name], DO 10/10/2023 11:25:49 AM This report has been signed electronically. Number of Addenda: 0

## 2023-10-10 NOTE — Interval H&P Note (Signed)
 History and Physical Interval Note:  10/10/2023 10:45 AM  [name] [name]  has presented today for surgery, with the diagnosis of screening.  The various methods of treatment have been discussed with the patient and family. After consideration of risks, benefits and other options for treatment, the patient has consented to  Procedure(s) with comments: COLONOSCOPY (N/A) - 10:45 am, ok rm 1/2 as a surgical intervention.  The patient's history has been reviewed, patient examined, no change in status, stable for surgery.  I have reviewed the patient's chart and labs.  Questions were answered to the patient's satisfaction.     [name]

## 2023-10-10 NOTE — Transfer of Care (Signed)
 Immediate Anesthesia Transfer of Care Note  Patient: [name] [name]  Procedure(s) Performed: COLONOSCOPY  Patient Location: Endoscopy Unit  Anesthesia Type:General  Level of Consciousness: awake, alert , oriented, and patient cooperative  Airway & Oxygen Therapy: Patient Spontanous Breathing  Post-op Assessment: Report given to RN, Post -op Vital signs reviewed and stable, and Patient moving all extremities X 4  Post vital signs: Reviewed and stable  Last Vitals:  Vitals Value Taken Time  BP 96/46 10/10/23 11:26  Temp 36.4 C 10/10/23 11:26  Pulse 91 10/10/23 11:26  Resp 14 10/10/23 11:26  SpO2 96 % 10/10/23 11:26    Last Pain:  Vitals:   10/10/23 1126  TempSrc: Axillary  PainSc: 0-No pain      Patients Stated Pain Goal: 4 (10/10/23 0946)  Complications: No notable events documented.

## 2023-10-11 ENCOUNTER — Encounter (HOSPITAL_COMMUNITY): Payer: Self-pay | Admitting: Internal Medicine

## 2023-10-11 LAB — SURGICAL PATHOLOGY

## 2023-10-15 NOTE — Anesthesia Postprocedure Evaluation (Signed)
 Anesthesia Post Note  Patient: [name] [name]  Procedure(s) Performed: COLONOSCOPY  Patient location during evaluation: Phase II Anesthesia Type: General Level of consciousness: awake Pain management: pain level controlled Vital Signs Assessment: post-procedure vital signs reviewed and stable Respiratory status: spontaneous breathing and respiratory function stable Cardiovascular status: blood pressure returned to baseline and stable Postop Assessment: no headache and no apparent nausea or vomiting Anesthetic complications: no Comments: Late entry   No notable events documented.   Last Vitals:  Vitals:   10/10/23 1128 10/10/23 1131  BP: (!) 97/48 133/68  Pulse: 90   Resp: 16   Temp:    SpO2: 96%     Last Pain:  Vitals:   10/10/23 1126  TempSrc: Axillary  PainSc: 0-No pain                 Yvonna JINNY Bosworth

## 2023-10-20 ENCOUNTER — Encounter: Payer: Self-pay | Admitting: Cardiovascular Disease

## 2023-10-20 ENCOUNTER — Ambulatory Visit: Payer: Self-pay | Admitting: Internal Medicine

## 2023-10-24 ENCOUNTER — Encounter: Payer: Self-pay | Admitting: Family Medicine

## 2023-10-25 NOTE — Telephone Encounter (Signed)
 Nurses Please work with Dempsey to give him one of my same-day slots somewhere in August That would allow me to listen to his heart and discussed with him vascular issues with blood flow to his legs as well as any heart murmurs etc. and discuss going ahead and moving forward with setting up test rather than waiting till his cardiology appointment in October Please move forward with the above and notify Dempsey If any trouble let me know-thank you-Dr. Glendia

## 2023-10-27 ENCOUNTER — Telehealth: Payer: Self-pay | Admitting: *Deleted

## 2023-10-27 DIAGNOSIS — I739 Peripheral vascular disease, unspecified: Secondary | ICD-10-CM

## 2023-10-27 DIAGNOSIS — I5032 Chronic diastolic (congestive) heart failure: Secondary | ICD-10-CM

## 2023-10-27 NOTE — Telephone Encounter (Signed)
 Per Dr. Delford : I have not seen him in a while. He has known moderate PVD and has seen [facility] 12/23. He needs updated ABI's and LE arterial duplex and be referred back to [facility]. He use to see Krystal Doing He had two echos in 2024 with no significant valve dx. Can order echo to assess this   Pt notified of the need for upcoming test.

## 2023-10-28 NOTE — Telephone Encounter (Signed)
 Nurses Please reach back out to [place] I am willing to work him in sooner he could have a same-day slot somewhere in the next couple weeks Please also send the following message to Dempsey Erskin Dempsey We certainly want to help out If you are open to being seen in August we will work you into the schedule and it is not a problem doing so I just have to have the nurses work with the schedule to put it on there  So truly if you would like to be seen in August lets go ahead with an appointment with them so we can address these issues  Look forward to seeing you please take care-Dr. Glendia

## 2023-11-03 ENCOUNTER — Encounter: Payer: Self-pay | Admitting: Dermatology

## 2023-11-03 ENCOUNTER — Ambulatory Visit: Admitting: Dermatology

## 2023-11-03 DIAGNOSIS — B372 Candidiasis of skin and nail: Secondary | ICD-10-CM

## 2023-11-03 DIAGNOSIS — B379 Candidiasis, unspecified: Secondary | ICD-10-CM

## 2023-11-03 DIAGNOSIS — L304 Erythema intertrigo: Secondary | ICD-10-CM | POA: Diagnosis not present

## 2023-11-03 MED ORDER — KETOCONAZOLE 2 % EX CREA: TOPICAL_CREAM | CUTANEOUS | 2 refills | Status: AC

## 2023-11-03 MED ORDER — HYDROCORTISONE 2.5 % EX CREA: TOPICAL_CREAM | CUTANEOUS | 2 refills | Status: AC

## 2023-11-03 NOTE — Progress Notes (Signed)
   New Patient Visit   Subjective  [name] [name] is a 73 y.o. male who presents for the following: Patient c/o reoccurring jock itch for ~ 5 months.  Past treatment  by his previous dermatologist prescribed Diflucan  tablets, Ketoconazole  cream and otc fungal cream with a poor response, patient report this rash will flare anytime the groin area get wet.     The following portions of the chart were reviewed this encounter and updated as appropriate: medications, allergies, medical history  Review of Systems:  No other skin or systemic complaints except as noted in HPI or Assessment and Plan.  Objective  Well appearing patient in no apparent distress; mood and affect are within normal limits.  A focused examination was performed of the following areas: Groin, toenails   Relevant exam findings are noted in the Assessment and Plan.    Assessment & Plan    Intertrigo with possible candidal superinfection Exam: pink macerated linear plaques in bilateral inguinal creases. No clear evidence of tinea pedis.  Chronic recurrent irritation of intertriginous skin  Treatment Plan: Start Ketoconazole  cream apply to groin area twice a day mix with Hydrocortisone  cream Start Hydrocortisone  cream apply to groin area twice a day mix with Ketoconazole  cream  Topical steroids (such as triamcinolone, fluocinolone, fluocinonide, mometasone, clobetasol, halobetasol, betamethasone , hydrocortisone ) can cause thinning and lightening of the skin if they are used for too long in the same area. Your physician has selected the right strength medicine for your problem and area affected on the body. Please use your medication only as directed by your physician to prevent side effects.    Recommend OTC Zeasorb AF powder to body folds daily after shower.  It is often found in the athlete's foot section in the pharmacy.  Avoid using powders that contain cornstarch.   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  IFay Kirks, CMA, am acting as scribe for Boneta Sharps, MD .   Documentation: I have reviewed the above documentation for accuracy and completeness, and I agree with the above.  Boneta Sharps, MD

## 2023-11-03 NOTE — Patient Instructions (Addendum)
 Recommend OTC Zeasorb AF powder to body folds daily after shower.  It is often found in the athlete's foot section in the pharmacy.  Avoid using powders that contain cornstarch.       Due to recent changes in healthcare laws, you may see results of your pathology and/or laboratory studies on MyChart before the doctors have had a chance to review them. We understand that in some cases there may be results that are confusing or concerning to you. Please understand that not all results are received at the same time and often the doctors may need to interpret multiple results in order to provide you with the best plan of care or course of treatment. Therefore, we ask that you please give us  2 business days to thoroughly review all your results before contacting the office for clarification. Should we see a critical lab result, you will be contacted sooner.   If You Need Anything After Your Visit  If you have any questions or concerns for your doctor, please call our main line at [phone] and press option 4 to reach your doctor's medical assistant. If no one answers, please leave a voicemail as directed and we will return your call as soon as possible. Messages left after 4 pm will be answered the following business day.   You may also send us  a message via MyChart. We typically respond to MyChart messages within 1-2 business days.  For prescription refills, please ask your pharmacy to contact our office. Our fax number is [phone].  If you have an urgent issue when the clinic is closed that cannot wait until the next business day, you can page your doctor at the number below.    Please note that while we do our best to be available for urgent issues outside of office hours, we are not available 24/7.   If you have an urgent issue and are unable to reach us , you may choose to seek medical care at your doctor's office, retail clinic, urgent care center, or emergency room.  If you have a  medical emergency, please immediately call 911 or go to the emergency department.  Pager Numbers  - Dr. Bary Likes: [phone]  - Dr. Annette Barters: [phone]  - Dr. Felipe Horton: [phone]   In the event of inclement weather, please call our main line at [phone] for an update on the status of any delays or closures.  Dermatology Medication Tips: Please keep the boxes that topical medications come in in order to help keep track of the instructions about where and how to use these. Pharmacies typically print the medication instructions only on the boxes and not directly on the medication tubes.   If your medication is too expensive, please contact our office at [phone] option 4 or send us  a message through MyChart.   We are unable to tell what your co-pay for medications will be in advance as this is different depending on your insurance coverage. However, we may be able to find a substitute medication at lower cost or fill out paperwork to get insurance to cover a needed medication.   If a prior authorization is required to get your medication covered by your insurance company, please allow us  1-2 business days to complete this process.  Drug prices often vary depending on where the prescription is filled and some pharmacies may offer cheaper prices.  The website www.goodrx.com contains coupons for medications through different pharmacies. The prices here do not account for what the cost may be  with help from insurance (it may be cheaper with your insurance), but the website can give you the price if you did not use any insurance.  - You can print the associated coupon and take it with your prescription to the pharmacy.  - You may also stop by our office during regular business hours and pick up a GoodRx coupon card.  - If you need your prescription sent electronically to a different pharmacy, notify our office through [facility] or by phone at [phone] option 4.     Si  Usted Necesita Algo Despus de Su Visita  Tambin puede enviarnos un mensaje a travs de Clinical cytogeneticist. Por lo general respondemos a los mensajes de MyChart en el transcurso de 1 a 2 das hbiles.  Para renovar recetas, por favor pida a su farmacia que se ponga en contacto con nuestra oficina. Franz Jacks de fax es [place] [phone].  Si tiene un asunto urgente cuando la clnica est cerrada y que no puede esperar hasta el siguiente da hbil, puede llamar/localizar a su doctor(a) al nmero que aparece a continuacin.   Por favor, tenga en cuenta que aunque hacemos todo lo posible para estar disponibles para asuntos urgentes fuera del horario de [place], no estamos disponibles las 24 horas del da, los 7 [address]  [address] de la [place].   Si tiene un problema urgente y no puede comunicarse con nosotros, puede optar por buscar atencin mdica  en el consultorio de su doctor(a), en una clnica privada, en un centro de atencin urgente o en una sala de emergencias.  Si tiene Engineer, drilling, por favor llame inmediatamente al 911 o vaya a la sala de emergencias.  Nmeros de bper  - Dr. Bary Likes: [phone]  - Dra. Annette Barters: [phone]  - Dr. Felipe Horton: [phone]   En caso de inclemencias del tiempo, por favor llame a Lajuan Pila principal al [phone] para una actualizacin sobre el [place] de cualquier retraso o cierre.  Consejos para la medicacin en dermatologa: Por favor, guarde las cajas en las que vienen los medicamentos de uso tpico para ayudarle a seguir las instrucciones sobre dnde y cmo usarlos. Las farmacias generalmente imprimen las instrucciones del medicamento slo en las cajas y no directamente en los tubos del [place].   Si su medicamento es muy caro, por favor, pngase en contacto con Bettyjane Brunet llamando al [phone] y presione la opcin 4 o envenos un mensaje a travs de Clinical cytogeneticist.   No podemos decirle cul ser su copago por los medicamentos por adelantado ya que  esto es diferente dependiendo de la cobertura de su seguro. Sin embargo, es posible que podamos encontrar un medicamento sustituto a Audiological scientist un formulario para que el seguro cubra el medicamento que se considera necesario.   Si se requiere una autorizacin previa para que su compaa de seguros [place] su medicamento, por favor permtanos de 1 a 2 das hbiles para completar este proceso.  Los precios de los medicamentos varan con frecuencia dependiendo del Environmental consultant de dnde se surte la receta y alguna farmacias pueden ofrecer precios ms baratos.  El sitio web www.goodrx.com tiene cupones para medicamentos de Health and safety inspector. Los precios aqu no tienen en cuenta lo que podra costar con la ayuda del seguro (puede ser ms barato con su seguro), pero el sitio web puede darle el precio si no utiliz Tourist information centre manager.  - Puede imprimir el cupn correspondiente y llevarlo con su receta a la farmacia.  - Tambin puede pasar por nuestra oficina  durante el horario de atencin regular y Education officer, museum una tarjeta de cupones de GoodRx.  - Si necesita que su receta se enve electrnicamente a una farmacia diferente, informe a nuestra oficina a travs de MyChart de [place] o por telfono llamando al [phone] y presione la opcin 4.

## 2023-11-22 ENCOUNTER — Encounter: Payer: Self-pay | Admitting: Family Medicine

## 2023-11-22 ENCOUNTER — Other Ambulatory Visit: Payer: Self-pay

## 2023-11-22 DIAGNOSIS — E785 Hyperlipidemia, unspecified: Secondary | ICD-10-CM

## 2023-11-22 DIAGNOSIS — Z79899 Other long term (current) drug therapy: Secondary | ICD-10-CM

## 2023-11-22 DIAGNOSIS — I10 Essential (primary) hypertension: Secondary | ICD-10-CM

## 2023-11-22 NOTE — Telephone Encounter (Signed)
 Nurses I read through [place] note As for COVID-vaccine-I recommend any of the manufacturers as long as it is the fall release and is a mRNA technology-these are the most effective  As for lab work I read through his urologist note.  They did a PSA in June looked very good.  Dr. Watt recommended yearly PSA so therefore he is not due for that PSA just yet  As for additional lab work on recommend the following Lipid, liver, metabolic 7, urine ACR  Diagnosis hyperlipidemia high risk med hypertension  Please have Dempsey do these labs about a week before his follow-up visit he should be in the morning time before eating but it is very important to be well-hydrated early that morning with water   You may share this message with Dempsey as well please order the labs  Thanks-Dr. Glendia

## 2023-11-23 ENCOUNTER — Ambulatory Visit: Payer: Self-pay

## 2023-11-23 ENCOUNTER — Ambulatory Visit: Attending: Cardiovascular Disease

## 2023-11-23 ENCOUNTER — Ambulatory Visit (INDEPENDENT_AMBULATORY_CARE_PROVIDER_SITE_OTHER)

## 2023-11-23 ENCOUNTER — Other Ambulatory Visit: Payer: Self-pay | Admitting: Family Medicine

## 2023-11-23 DIAGNOSIS — I739 Peripheral vascular disease, unspecified: Secondary | ICD-10-CM

## 2023-11-23 DIAGNOSIS — I5032 Chronic diastolic (congestive) heart failure: Secondary | ICD-10-CM | POA: Diagnosis not present

## 2023-11-23 DIAGNOSIS — R0989 Other specified symptoms and signs involving the circulatory and respiratory systems: Secondary | ICD-10-CM

## 2023-11-23 LAB — ECHOCARDIOGRAM COMPLETE
AR max vel: 3.77 cm2
AV Peak grad: 4.5 mmHg
Ao pk vel: 1.06 m/s
Area-P 1/2: 5.27 cm2
Calc EF: 56.6 %
P 1/2 time: 535 ms
S' Lateral: 2.4 cm
Single Plane A2C EF: 52.6 %
Single Plane A4C EF: 54 %

## 2023-11-23 LAB — VAS US ABI WITH/WO TBI
Left ABI: 0.86
Right ABI: 0.64

## 2023-11-29 ENCOUNTER — Other Ambulatory Visit: Payer: Self-pay | Admitting: Family Medicine

## 2023-11-30 ENCOUNTER — Other Ambulatory Visit: Payer: Self-pay

## 2023-11-30 MED ORDER — EZETIMIBE 10 MG PO TABS: 10.0000 mg | ORAL_TABLET | Freq: Every day | ORAL | 3 refills | Status: DC

## 2023-12-05 NOTE — Progress Notes (Unsigned)
 [facility] [place]  ASSESSMENT / PLAN: [name] [name] is a 73 y.o. male with asymptomatic atherosclerosis of native arteries of bilateral lower extremities.   Recommend:  Abstinence from all tobacco products. Blood glucose control with goal A1c < 7%. Blood pressure control with goal blood pressure < 130/80 mmHg. Lipid reduction therapy with goal LDL-C < 55 mg/dL. Aspirin  81mg  by mouth daily. Atorvastatin  40-80mg  PO QD (or other high intensity statin therapy). Daily walking.  Follow-up as needed for any concern for symptomatic peripheral arterial disease.  Patient counseled about symptoms typical of claudication, rest pain, and ischemic ulceration.  CHIEF COMPLAINT: Peripheral arterial disease on noninvasive testing  HISTORY OF PRESENT ILLNESS: [name] [name] is a 73 y.o. male with noninvasive testing concerning for peripheral arterial disease referred for further evaluation.  The patient is asymptomatic from a peripheral arterial disease standpoint.  He reports he is not limited in his ability to walk.  He does walk with a cane, and does report easy fatigability.  He does not report claudication symptoms.  He has no rest pain in his feet.  He has no ulcers about his feet.  We reviewed his noninvasive testing.  Past Medical History:  Diagnosis Date   Aortic atherosclerosis (HCC) 10/19/2016    Seen on chest x-ray July 2018   Arthritis    right knee and hip    Cancer [facility])    prostate cancer    Chronic kidney disease    prostate cancer    GERD (gastroesophageal reflux disease)    Gout    Gout    Hypercholesteremia    Hyperglycemia    Hyperlipidemia    Hypertension    Sleep apnea    Status post THR (total hip replacement) 04/20/13 05/01/2013   Right total hip 04/20/2013 Dr. Margrette Eric implant     Past Surgical History:  Procedure Laterality Date   COLONOSCOPY     COLONOSCOPY N/A 10/22/2013   Procedure: COLONOSCOPY;  Surgeon: Margo LITTIE Haddock,  MD;  Location: [facility];  Service: Endoscopy;  Laterality: N/A;  1115   COLONOSCOPY N/A 10/10/2023   Procedure: COLONOSCOPY;  Surgeon: [name] Carlin POUR, DO;  Location: [facility];  Service: Endoscopy;  Laterality: N/A;  10:45 am, ok rm 1/2   HERNIA REPAIR     right inguinal hernia repair    LUMBAR LAMINECTOMY/DECOMPRESSION MICRODISCECTOMY Right 03/16/2021   Procedure: Right Lumbar four-five Laminectomy/foraminotomy;  Surgeon: Joshua Alm RAMAN, MD;  Location: [facility];  Service: Neurosurgery;  Laterality: Right;   NASAL SEPTUM SURGERY     OTHER SURGICAL HISTORY     deviated septum surgery    ROBOT ASSISTED LAPAROSCOPIC RADICAL PROSTATECTOMY  04/20/2011   Procedure: ROBOTIC ASSISTED LAPAROSCOPIC RADICAL PROSTATECTOMY;  Surgeon: Norleen JINNY Seltzer, MD;  Location: [facility];  Service: Urology;  Laterality: N/A;  Bilateral lymph node dissection   TOTAL HIP ARTHROPLASTY Right 04/20/2013   Procedure: TOTAL HIP ARTHROPLASTY;  Surgeon: Taft FORBES Margrette, MD;  Location: [facility];  Service: Orthopedics;  Laterality: Right;   TOTAL KNEE ARTHROPLASTY Right 05/16/2018   Procedure: TOTAL KNEE ARTHROPLASTY;  Surgeon: Margrette Taft FORBES, MD;  Location: [facility];  Service: Orthopedics;  Laterality: Right;    Family History  Problem Relation Age of Onset   Diabetes Mother    Hypertension Mother    Hypertension Brother     Social History   Socioeconomic History   Marital status: Single    Spouse name: Not on file  Number of children: Not on file   Years of education: Not on file   Highest education level: Bachelor's degree (e.g., BA, AB, BS)  Occupational History   Not on file  Tobacco Use   Smoking status: Former    Current packs/day: 0.00    Average packs/day: 0.5 packs/day for 8.0 years (4.0 ttl pk-yrs)    Types: Cigarettes    Start date: 03/22/1976    Quit date: 03/22/1984    Years since quitting: 39.7   Smokeless tobacco: Never  Vaping Use   Vaping status: Never Used  Substance and Sexual Activity    Alcohol use: Not Currently    Alcohol/week: 1.0 standard drink of alcohol    Types: 1 Cans of beer per week    Comment: occasional    Drug use: No   Sexual activity: Yes    Birth control/protection: None  Other Topics Concern   Not on file  Social History Narrative   Not on file   Social Drivers of Health   Financial Resource Strain: Medium Risk (03/02/2023)   Overall Financial Resource Strain (CARDIA)    Difficulty of Paying Living Expenses: Somewhat hard  Food Insecurity: No Food Insecurity (03/02/2023)   Hunger Vital Sign    Worried About Running Out of Food in the Last Year: Never true    Ran Out of Food in the Last Year: Never true  Transportation Needs: No Transportation Needs (03/02/2023)   PRAPARE - Administrator, Civil Service (Medical): No    Lack of Transportation (Non-Medical): No  Physical Activity: Insufficiently Active (03/02/2023)   Exercise Vital Sign    Days of Exercise per Week: 2 days    Minutes of Exercise per Session: 10 min  Stress: Stress Concern Present (03/02/2023)   [employer] of Occupational Health - Occupational Stress Questionnaire    Feeling of Stress : Very much  Social Connections: Socially Isolated (03/02/2023)   Social Connection and Isolation Panel    Frequency of Communication with Friends and Family: Once a week    Frequency of Social Gatherings with Friends and Family: Never    Attends Religious Services: Never    Database administrator or Organizations: No    Attends Banker Meetings: Never    Marital Status: Living with partner  Intimate Partner Violence: Not At Risk (12/31/2022)   Humiliation, Afraid, Rape, and Kick questionnaire    Fear of Current or Ex-Partner: No    Emotionally Abused: No    Physically Abused: No    Sexually Abused: No    Allergies  Allergen Reactions   Citalopram      Fatigue headache    Lisinopril  Cough    Dry cough    Current Outpatient Medications  Medication  Sig Dispense Refill   allopurinol  (ZYLOPRIM ) 100 MG tablet TAKE 1 TABLET BY MOUTH DAILY 100 tablet 2   amLODipine  (NORVASC ) 5 MG tablet TAKE 1 TABLET BY MOUTH DAILY 100 tablet 2   aspirin  81 MG chewable tablet Chew 81 mg by mouth daily.      atorvastatin  (LIPITOR) 40 MG tablet TAKE 1 TABLET BY MOUTH DAILY 100 tablet 2   B Complex-C (SUPER B COMPLEX PO) Take 1 tablet by mouth daily.     buPROPion  (WELLBUTRIN  XL) 150 MG 24 hr tablet TAKE 1 TABLET BY MOUTH DAILY  STOP CITALOPRAM  90 tablet 3   diclofenac  (VOLTAREN ) 75 MG EC tablet TAKE (1) TABLET BY MOUTH TWICE A DAY AS NEEDED. 60 tablet  1   ezetimibe  (ZETIA ) 10 MG tablet Take 1 tablet (10 mg total) by mouth daily. 90 tablet 3   hydrocortisone  2.5 % cream Apply to groin area twice a day mix with Ketoconazole  cream 30 g 2   ketoconazole  (NIZORAL ) 2 % cream Apply to groin area twice a day mix with Hydrocortisone  cream 30 g 2   lisinopril  (ZESTRIL ) 5 MG tablet Take 5 mg by mouth daily.     Multiple Vitamin (MULTIVITAMIN) tablet Take 1 tablet by mouth daily.     OVER THE COUNTER MEDICATION Otc Vit D daily  Systane eye drops three times per day.     No current facility-administered medications for this visit.    PHYSICAL EXAM Vitals:   12/06/23 0849  BP: 135/74  Pulse: 93  SpO2: 98%  Weight: 190 lb (86.2 kg)  Height: 5' 7 (1.702 m)   Well-appearing elderly man in no distress Regular rate and rhythm Unlabored breathing No palpable pedal pulses Feet are warm and well-perfused  PERTINENT LABORATORY AND RADIOLOGIC DATA  Most recent CBC    Latest Ref Rng & Units 08/23/2023    9:24 AM 06/15/2023   12:01 PM 11/25/2022   12:53 PM  CBC  WBC 3.4 - 10.8 x10E3/uL  9.2    Hemoglobin 13.0 - 17.7 g/dL 85.6  85.1  87.5   Hematocrit 37.5 - 51.0 % 43.8  45.0  40.0   Platelets 150 - 450 x10E3/uL  257       Most recent CMP    Latest Ref Rng & Units 06/15/2023   12:01 PM 02/28/2023    8:17 AM 10/26/2022    4:53 PM  CMP  Glucose 70 - 99 mg/dL 89   98  890   BUN 8 - 27 mg/dL 14  13  12    Creatinine 0.76 - 1.27 mg/dL 8.92  8.80  8.83   Sodium 134 - 144 mmol/L 143  144  141   Potassium 3.5 - 5.2 mmol/L 5.2  5.3  5.0   Chloride 96 - 106 mmol/L 103  105  102   CO2 20 - 29 mmol/L 25  24  23    Calcium  8.6 - 10.2 mg/dL 9.3  8.9    Total Protein 6.0 - 8.5 g/dL 6.7  7.1  7.1   Total Bilirubin 0.0 - 1.2 mg/dL 0.8  0.9  0.8   Alkaline Phos 44 - 121 IU/L 126  149  143   AST 0 - 40 IU/L 19  16  17    ALT 0 - 44 IU/L 19  13  12      Renal function CrCl cannot be calculated (Patient's most recent lab result is older than the maximum 21 days allowed.).  Hgb A1c MFr Bld (%)  Date Value  11/26/2020 5.5    LDL Chol Calc (NIH)  Date Value Ref Range Status  02/28/2023 111 (H) 0 - 99 mg/dL Final     LOWER EXTREMITY ARTERIAL DUPLEX STUDY  Patient Name:  [name] [name]  Date of Exam:   11/23/2023 Medical Rec #: [id number]         Accession #:    [id number] Date of Birth: Jun 05, 1950         Patient Gender: M Patient Age:   8 years Exam Location:  [place] Procedure:      VAS US  LOWER EXTREMITY ARTERIAL DUPLEX Referring Phys: MAUDE EMMER   --------------------------------------------------------------------------- -----   Indications: Peripheral artery disease.  High Risk Factors: Hypertension, hyperlipidemia,  Diabetes, past history of                    smoking, prior MI, coronary artery disease.  Other Factors: Patient states My legs get tired when I walk.  Current ABI: RT: 0.64/1.54-Non Comp              LT: 0.86/1.54-Non Comp  Comparison Study: None.  Performing Technologist: Bascom Burows RCS, RVS    Examination Guidelines: A complete evaluation includes B-mode imaging, spectral Doppler, color Doppler, and power Doppler as needed of all accessible portions of each vessel. Bilateral testing is considered an integral part of a complete examination. Limited examinations for reoccurring indications may be performed as  noted.      +----------+--------+-----+--------+-------------------+--------+ RIGHT     PSV cm/sRatioStenosisWaveform           Comments +----------+--------+-----+--------+-------------------+--------+ CFA Prox  78                   biphasic                    +----------+--------+-----+--------+-------------------+--------+ DFA       262                  biphasic                    +----------+--------+-----+--------+-------------------+--------+ SFA Prox  62                   biphasic                    +----------+--------+-----+--------+-------------------+--------+ SFA Mid   80                   biphasic                    +----------+--------+-----+--------+-------------------+--------+ SFA Distal53                   monophasic                  +----------+--------+-----+--------+-------------------+--------+ POP Prox  44                   biphasic                    +----------+--------+-----+--------+-------------------+--------+ POP Distal39                   biphasic                    +----------+--------+-----+--------+-------------------+--------+ TP Trunk  39                   biphasic                    +----------+--------+-----+--------+-------------------+--------+ ATA Mid   28                   monophasic                  +----------+--------+-----+--------+-------------------+--------+ PTA Prox  20                   dampened monophasic         +----------+--------+-----+--------+-------------------+--------+ PTA Mid   15                   monophasic                  +----------+--------+-----+--------+-------------------+--------+  PTA Distal29                   monophasic                  +----------+--------+-----+--------+-------------------+--------+ PERO Mid  35                   monophasic                   +----------+--------+-----+--------+-------------------+--------+ DP        22                   dampened monophasic         +----------+--------+-----+--------+-------------------+--------+       +----------+--------+-----+--------+-------------------+--------+ LEFT      PSV cm/sRatioStenosisWaveform           Comments +----------+--------+-----+--------+-------------------+--------+ CFA Prox  99                   biphasic                    +----------+--------+-----+--------+-------------------+--------+ DFA       309                  Stenotic                    +----------+--------+-----+--------+-------------------+--------+ SFA Prox  71                   biphasic                    +----------+--------+-----+--------+-------------------+--------+ SFA Mid   74                   biphasic                    +----------+--------+-----+--------+-------------------+--------+ SFA Distal73                   biphasic                    +----------+--------+-----+--------+-------------------+--------+ POP Prox  28                   biphasic                    +----------+--------+-----+--------+-------------------+--------+ POP Distal32                   monophasic                  +----------+--------+-----+--------+-------------------+--------+ TP Trunk  21                   monophasic                  +----------+--------+-----+--------+-------------------+--------+ ATA Mid   28                   monophasic                  +----------+--------+-----+--------+-------------------+--------+ PTA Prox  18                   dampened monophasic         +----------+--------+-----+--------+-------------------+--------+ PTA Mid   19                   monophasic                  +----------+--------+-----+--------+-------------------+--------+  PTA Distal24                   monophasic                   +----------+--------+-----+--------+-------------------+--------+ PERO Mid  20                   monophasic                  +----------+--------+-----+--------+-------------------+--------+ DP        13                   dampened monophasic         +----------+--------+-----+--------+-------------------+--------+       Summary: Right: 50-74% stenosis noted in the deep femoral artery. Large atherosclerotic plaque throughout the right lower extremity.  Left: 50-74% stenosis noted in the deep femoral artery. Large atherosclerotic plaque throughout the left lower extremity.    See table(s) above for measurements and observations.      LOWER EXTREMITY DOPPLER STUDY  Patient Name:  [name] [name]  Date of Exam:   11/23/2023 Medical Rec #: [id number]         Accession #:    [id number] Date of Birth: [birth year]         Patient Gender: M Patient Age:   40 years Exam Location:  [facility] Procedure:      VAS US  ABI WITH/WO TBI Referring Phys: MAUDE EMMER   --------------------------------------------------------------------------- -----   Indications: Peripheral artery disease.  High Risk Factors: Hypertension, hyperlipidemia, Diabetes, past history of                    smoking, prior MI, coronary artery disease.  Other Factors: Patient states My legs get tired when I walk.  Comparison Study: None.  Performing Technologist: Elby President RVS, RCS    Examination Guidelines: A complete evaluation includes at minimum, Doppler waveform signals and systolic blood pressure reading at the level of bilateral brachial, anterior tibial, and posterior tibial arteries, when vessel segments are accessible. Bilateral testing is considered an integral part of a complete examination. Photoelectric Plethysmograph (PPG) waveforms and toe systolic pressure readings are included as required and additional duplex testing as needed. Limited examinations for reoccurring  indications may be performed as noted.    ABI Findings: +---------+------------------+-----+-------------------+--------+ Right    Rt Pressure (mmHg)IndexWaveform           Comment  +---------+------------------+-----+-------------------+--------+ Brachial 165                                                +---------+------------------+-----+-------------------+--------+ PTA      106               0.64 monophasic                  +---------+------------------+-----+-------------------+--------+ DP       95                0.58 dampened monophasic         +---------+------------------+-----+-------------------+--------+ Great Toe254               1.54                             +---------+------------------+-----+-------------------+--------+  +---------+------------------+-----+-------------------+-------+ Left  Lt Pressure (mmHg)IndexWaveform           Comment +---------+------------------+-----+-------------------+-------+ Brachial 162                                               +---------+------------------+-----+-------------------+-------+ PTA      142               0.86 monophasic                 +---------+------------------+-----+-------------------+-------+ DP       86                0.52 dampened monophasic        +---------+------------------+-----+-------------------+-------+ Great Toe254               1.54                            +---------+------------------+-----+-------------------+-------+  +-------+-----------+-------------+------------+------------+ ABI/TBIToday's ABIToday's TBI  Previous ABIPrevious TBI +-------+-----------+-------------+------------+------------+ Right  0.64       Non-Comp 1.54                         +-------+-----------+-------------+------------+------------+ Left   0.86       Non-Comp 1.54                          +-------+-----------+-------------+------------+------------+          Summary: Right: Resting right ankle-brachial index indicates moderate right lower extremity arterial disease.   Right toe pressure 1.54 Non-Compressible. Left: Resting left ankle-brachial index indicates mild left lower extremity arterial disease.   Left toe pressure 1.54 Non-Compressible. *See table(s) above for measurements and observations.      Debby SAILOR. Magda, MD FACS [facility] Phone Number: [phone] 12/05/2023 8:23 PM   Total time spent on preparing this encounter including chart review, data review, collecting history, examining the patient, and coordinating care: 45 minutes  Portions of this report may have been transcribed using voice recognition software.  Every effort has been made to ensure accuracy; however, inadvertent computerized transcription errors may still be present.

## 2023-12-06 ENCOUNTER — Encounter: Payer: Self-pay | Admitting: Vascular Surgery

## 2023-12-06 ENCOUNTER — Ambulatory Visit (INDEPENDENT_AMBULATORY_CARE_PROVIDER_SITE_OTHER): Admitting: Vascular Surgery

## 2023-12-06 VITALS — BP 135/74 | HR 93 | Ht 67.0 in | Wt 190.0 lb

## 2023-12-06 DIAGNOSIS — I739 Peripheral vascular disease, unspecified: Secondary | ICD-10-CM | POA: Diagnosis not present

## 2023-12-09 NOTE — Progress Notes (Signed)
 Cardiology Office Note:    Date:  12/23/2023   ID:  KEAN GAUTREAU, DOB 09/21/1950, MRN [id number]  PCP:  Alphonsa Glendia LABOR, MD  [facility] Cardiologist:  Maude Emmer, MD  [facility] Electrophysiologist:  None   Referring MD: Alphonsa Glendia LABOR, MD    History of Present Illness:    DAHMIR EPPERLY is a 73 y.o. male with a hx of hypertension, hyperlipidemia, diabetes, chronic lower extremity edema, HFimpEF, CAD who presents for follow-up. I haven't seen him in 3 years Last seen by PA 12/13/22   In 2020 patient had chest pain and Myoview  Lexiscan  was ordered, which showed no ischemia, overall low risk.  Echo in 2022 showed pump function 65 to 70%, normal RV function.  Patient was seen  January 2023 reporting exertional fatigue and diaphoresis.  Cardiac CTA was ordered, which showed mild to moderate mixed CAD including left main, LAD and left circumflex, coronary calcium  score 760 was 78th percentile for age and sex matched.  It was sent for FFR, which did not show clinically significant stenosis.  It did showed reduced FFR and small D1 branch at 0.72, however this was not amenable to intervention.   Patient was [facility] at [facility]  09/25/22 for acute anemia with hemoglobin of 4.7.  He was transfused with posttransfusion hemoglobin 8.3.  GI was consulted.  EGD showed 3 small nonbleeding ulcers.  No additional transfusions were required and hemoglobin remained stable.  Cardiology was consulted for elevated troponin, peaked at 1342.  Echo with newly reduced EF to 45% and new wall motion abnormality.  Plan was for left heart cath for ischemic evaluation but wanted to ensure patient was able to tolerate DAPT from a bleeding standpoint.  He was started on Toprol , losartan, and spironolactone.  The cardiology office was notified of recent admission with low EF.  Repeat echo was ordered. Echo  12/06/22 showed improved LVEF 55 to 60%, grade 1 diastolic dysfunction, normal RV function, trivial AI. F/U  myovue done 12/21/22 was non ischemic with normal perfusion and EF 64%  Subsequently He had both hips replaced I with no cardiac issues    From [place]. Moved here with life long friend who lives with him Simple life with limited social interactions  Past Medical History:  Diagnosis Date   Aortic atherosclerosis 10/19/2016    Seen on chest x-ray July 2018   Arthritis    right knee and hip    Cancer (HCC)    prostate cancer    Chronic kidney disease    prostate cancer    GERD (gastroesophageal reflux disease)    Gout    Gout    Hypercholesteremia    Hyperglycemia    Hyperlipidemia    Hypertension    Sleep apnea    Status post THR (total hip replacement) 04/20/13 05/01/2013   Right total hip 04/20/2013 Dr. Margrette Eric implant     Past Surgical History:  Procedure Laterality Date   COLONOSCOPY     COLONOSCOPY N/A 10/22/2013   Procedure: COLONOSCOPY;  Surgeon: Margo LITTIE Haddock, MD;  Location: [facility];  Service: Endoscopy;  Laterality: N/A;  1115   COLONOSCOPY N/A 10/10/2023   Procedure: COLONOSCOPY;  Surgeon: [name] Carlin POUR, DO;  Location: [facility];  Service: Endoscopy;  Laterality: N/A;  10:45 am, ok rm 1/2   HERNIA REPAIR     right inguinal hernia repair    LUMBAR LAMINECTOMY/DECOMPRESSION MICRODISCECTOMY Right 03/16/2021   Procedure: Right Lumbar four-five Laminectomy/foraminotomy;  Surgeon: Joshua Alm RAMAN, MD;  Location: [facility] OR;  Service: Neurosurgery;  Laterality: Right;   NASAL SEPTUM SURGERY     OTHER SURGICAL HISTORY     deviated septum surgery    ROBOT ASSISTED LAPAROSCOPIC RADICAL PROSTATECTOMY  04/20/2011   Procedure: ROBOTIC ASSISTED LAPAROSCOPIC RADICAL PROSTATECTOMY;  Surgeon: Norleen JINNY Seltzer, MD;  Location: [facility] ORS;  Service: Urology;  Laterality: N/A;  Bilateral lymph node dissection   TOTAL HIP ARTHROPLASTY Right 04/20/2013   Procedure: TOTAL HIP ARTHROPLASTY;  Surgeon: Taft FORBES Minerva, MD;  Location: [facility] ORS;  Service: Orthopedics;  Laterality: Right;    TOTAL KNEE ARTHROPLASTY Right 05/16/2018   Procedure: TOTAL KNEE ARTHROPLASTY;  Surgeon: Minerva Taft FORBES, MD;  Location: [facility] OR[facility];  Service: Orthopedics;  Laterality: Right;    Current Medications: Current Meds  Medication Sig   allopurinol  (ZYLOPRIM ) 100 MG tablet TAKE 1 TABLET BY MOUTH DAILY   amLODipine  (NORVASC ) 5 MG tablet TAKE 1 TABLET BY MOUTH DAILY   aspirin  81 MG chewable tablet Chew 81 mg by mouth daily.    atorvastatin  (LIPITOR) 40 MG tablet TAKE 1 TABLET BY MOUTH DAILY   B Complex-C (SUPER B COMPLEX PO) Take 1 tablet by mouth daily.   buPROPion  (WELLBUTRIN  XL) 150 MG 24 hr tablet TAKE 1 TABLET BY MOUTH DAILY  STOP CITALOPRAM    ezetimibe  (ZETIA ) 10 MG tablet Take 1 tablet (10 mg total) by mouth daily.   hydrocortisone  2.5 % cream Apply to groin area twice a day mix with Ketoconazole  cream   ketoconazole  (NIZORAL ) 2 % cream Apply to groin area twice a day mix with Hydrocortisone  cream   Multiple Vitamin (MULTIVITAMIN) tablet Take 1 tablet by mouth daily.   OVER THE COUNTER MEDICATION Otc Vit D daily  Systane eye drops three times per day.     Allergies:   Citalopram  and Lisinopril    Social History   Socioeconomic History   Marital status: Single    Spouse name: Not on file   Number of children: Not on file   Years of education: Not on file   Highest education level: Bachelor's degree (e.g., BA, AB, BS)  Occupational History   Not on file  Tobacco Use   Smoking status: Former    Current packs/day: 0.00    Average packs/day: 0.5 packs/day for 8.0 years (4.0 ttl pk-yrs)    Types: Cigarettes    Start date: 03/22/1976    Quit date: 03/22/1984    Years since quitting: 39.7   Smokeless tobacco: Never  Vaping Use   Vaping status: Never Used  Substance and Sexual Activity   Alcohol use: Not Currently    Alcohol/week: 1.0 standard drink of alcohol    Types: 1 Cans of beer per week    Comment: occasional    Drug use: No   Sexual activity: Yes    Birth  control/protection: None  Other Topics Concern   Not on file  Social History Narrative   Not on file   Social Drivers of Health   Financial Resource Strain: Medium Risk (03/02/2023)   Overall Financial Resource Strain (CARDIA)    Difficulty of Paying Living Expenses: Somewhat hard  Food Insecurity: No Food Insecurity (03/02/2023)   Hunger Vital Sign    Worried About Running Out of Food in the Last Year: Never true    Ran Out of Food in the Last Year: Never true  Transportation Needs: No Transportation Needs (03/02/2023)   PRAPARE - Transportation    Lack of Transportation (  Medical): No    Lack of Transportation (Non-Medical): No  Physical Activity: Insufficiently Active (03/02/2023)   Exercise Vital Sign    Days of Exercise per Week: 2 days    Minutes of Exercise per Session: 10 min  Stress: Stress Concern Present (03/02/2023)   [employer] of Occupational Health - Occupational Stress Questionnaire    Feeling of Stress : Very much  Social Connections: Socially Isolated (03/02/2023)   Social Connection and Isolation Panel    Frequency of Communication with Friends and Family: Once a week    Frequency of Social Gatherings with Friends and Family: Never    Attends Religious Services: Never    Database administrator or Organizations: No    Attends Engineer, structural: Never    Marital Status: Living with partner     Family History: The patient's family history includes Diabetes in his mother; Hypertension in his brother and mother.  ROS:   Please see the history of present illness.     All other systems reviewed and are negative.  EKGs/Labs/Other Studies Reviewed:    The following studies were reviewed today  Myovue 12/21/22 Study Result  Narrative & Impression      Findings are consistent with no ischemia. The study is low risk.   No ST deviation was noted. Transient ectopic atrial rhythm noted. The ECG was negative for ischemia.   LV perfusion is  normal.  No significant myocardial perfusion defects to indicate scar or ischemia.   Left ventricular function is normal. Nuclear stress EF: 64%.   Low risk study with no myocardial perfusion defects to indicate scar or ischemia and LVEF 64%.     Echo 11/23/23  IMPRESSIONS     1. Left ventricular ejection fraction, by estimation, is 60 to 65%. The  left ventricle has normal function. The left ventricle has no regional  wall motion abnormalities. Left ventricular diastolic parameters are  consistent with Grade I diastolic  dysfunction (impaired relaxation).   2. Right ventricular systolic function is normal. The right ventricular  size is normal. Tricuspid regurgitation signal is inadequate for assessing  PA pressure.   3. The mitral valve is normal in structure. Trivial mitral valve  regurgitation. No evidence of mitral stenosis.   4. The aortic valve is tricuspid. Aortic valve regurgitation is trivial.  No aortic stenosis is present. Aortic regurgitation PHT measures 535 msec.   Comparison(s): A prior study was performed on 12/06/2022. EF 55-60%.  Trivial AI. SABRA    Cardiac CTA 04/2021 IMPRESSION: 1. Mild to moderate mixed CAD, including left main, LAD and LCx. CADRADS = 3. CT FFR will be performed and reported separately.   2. Coronary calcium  score of 760. This was 78th percentile for age and sex matched control.   3. Normal coronary origin with right dominance.   4. Aortic annular calcification and aortic atherosclerosis.     Electronically Signed   By: Vinie JAYSON Maxcy M.D.   On: 05/04/2021 11:35   IMPRESSION: 1. CT FFR analysis did not show any clinically significant stenosis.   2. Reduced FFR noted in a small D1 branch at 0.72. This branch is <2 mm and is not amenable to intervention.   3.  Aggressive coronary risk factor modification is recommended.     Electronically Signed   By: Vinie JAYSON Maxcy M.D.   On: 05/04/2021 11:43   EKG:  EKG is not ordered  today.    Recent Labs: 06/15/2023: ALT 19; BUN 14; Creatinine, Ser  1.07; Platelets 257; Potassium 5.2; Sodium 143 08/23/2023: Hemoglobin 14.3  Recent Lipid Panel    Component Value Date/Time   CHOL 175 02/28/2023 0817   TRIG 130 02/28/2023 0817   HDL 41 02/28/2023 0817   CHOLHDL 4.3 02/28/2023 0817   CHOLHDL 4.7 12/05/2013 0705   VLDL 48 (H) 12/05/2013 0705   LDLCALC 111 (H) 02/28/2023 0817     Physical Exam:    VS:  BP 138/80 (BP Location: Left Arm, Cuff Size: Normal)   Pulse 70   Ht 5' 8 (1.727 m)   Wt 196 lb 6.4 oz (89.1 kg)   SpO2 94%   BMI 29.86 kg/m     Wt Readings from Last 3 Encounters:  12/23/23 196 lb 6.4 oz (89.1 kg)  12/06/23 190 lb (86.2 kg)  09/21/23 194 lb 6.4 oz (88.2 kg)     Affect appropriate Healthy:  appears stated age HEENT: normal Neck supple with no adenopathy JVP normal no bruits no thyromegaly Lungs clear with no wheezing and good diaphragmatic motion Heart:  S1/S2 no murmur, no rub, gallop or click PMI normal Abdomen: benighn, BS positve, no tenderness, no AAA no bruit.  No HSM or HJR Decreased left pedal pulses  S/P bilateral THR    PLAN:    In order of problems listed above:  HFimpEF Echo now with normalization of EF on 12/06/22 and 11/23/23. EF also normal  on myovue 12/21/22   CAD Patient was admitted for non-STEMI and mildly reduced EF (now normal) in July 2024 as above. Patient denies any anginal symptoms.  Patient had cardiac CTA 2023 showing mild to moderate CAD, including left main, LAD and circumflex, with negative FFR. Myovue 12/21/22 normal with no ischemia  Continue 81 mg ASA, zetia  and lipitor   HTN Blood pressure is normal today. Continue losartan, Toprol , and amlodipine .  HLD On zetia  and lipitor Last LDL 111  DM2 Most recent A1c 5.2, this is followed by PCP.  PVD F/u Dr Magda. Decreased ABI 11/23/23 on right 0.64 and left 0.86 no clinical claudication observe   Disposition: Follow up in 1 year(s) with MD/APP     Signed, Maude Emmer, MD  12/23/2023 10:18 AM    [facility]

## 2023-12-23 ENCOUNTER — Ambulatory Visit: Attending: Cardiovascular Disease | Admitting: Cardiovascular Disease

## 2023-12-23 ENCOUNTER — Encounter: Payer: Self-pay | Admitting: Cardiovascular Disease

## 2023-12-23 VITALS — BP 138/80 | HR 70 | Ht 68.0 in | Wt 196.4 lb

## 2023-12-23 DIAGNOSIS — I2510 Atherosclerotic heart disease of native coronary artery without angina pectoris: Secondary | ICD-10-CM | POA: Diagnosis not present

## 2023-12-23 DIAGNOSIS — I10 Essential (primary) hypertension: Secondary | ICD-10-CM | POA: Diagnosis not present

## 2023-12-23 DIAGNOSIS — E118 Type 2 diabetes mellitus with unspecified complications: Secondary | ICD-10-CM | POA: Diagnosis not present

## 2023-12-23 DIAGNOSIS — I509 Heart failure, unspecified: Secondary | ICD-10-CM

## 2023-12-23 NOTE — Patient Instructions (Signed)
 Medication Instructions:  Your physician recommends that you continue on your current medications as directed. Please refer to the Current Medication list given to you today.   Labwork: None today  Testing/Procedures: None today  Follow-Up: 1 year  Any Other Special Instructions Will Be Listed Below (If Applicable).  If you need a refill on your cardiac medications before your next appointment, please call your pharmacy.

## 2023-12-30 ENCOUNTER — Ambulatory Visit: Payer: Medicare Other

## 2023-12-30 VITALS — Ht 68.0 in | Wt 196.0 lb

## 2023-12-30 DIAGNOSIS — Z0000 Encounter for general adult medical examination without abnormal findings: Secondary | ICD-10-CM | POA: Diagnosis not present

## 2023-12-30 NOTE — Patient Instructions (Signed)
 [name] [name],  Thank you for taking the time for your Medicare Wellness Visit. I appreciate your continued commitment to your health goals. Please review the care plan we discussed, and feel free to reach out if I can assist you further.  Medicare recommends these wellness visits once per year to help you and your care team stay ahead of potential health issues. These visits are designed to focus on prevention, allowing your provider to concentrate on managing your acute and chronic conditions during your regular appointments.  Please note that Annual Wellness Visits do not include a physical exam. Some assessments may be limited, especially if the visit was conducted virtually. If needed, we may recommend a separate in-person follow-up with your provider.  Ongoing Care Seeing your primary care provider every 3 to 6 months helps us  monitor your health and provide consistent, personalized care.   Referrals If a referral was made during today's visit and you haven't received any updates within two weeks, please contact the referred provider directly to check on the status.  Recommended Screenings:  Health Maintenance  Topic Date Due   DTaP/Tdap/Td vaccine (1 - Tdap) Never done   Yearly kidney health urinalysis for diabetes  08/06/2023   COVID-19 Vaccine (5 - 2025-26 season) 11/21/2023   Yearly kidney function blood test for diabetes  06/14/2024   Medicare Annual Wellness Visit  12/29/2024   Colon Cancer Screening  10/09/2033   Pneumococcal Vaccine for age over 30  Completed   Flu Shot  Completed   Hepatitis C Screening  Completed   Zoster (Shingles) Vaccine  Completed   Meningitis B Vaccine  Aged Out       12/30/2023    1:17 PM  Advanced Directives  Does Patient Have a Medical Advance Directive? No  Would patient like information on creating a medical advance directive? Yes (MAU/Ambulatory/Procedural Areas - Information given)   Advance Care Planning is important because it: Ensures  you receive medical care that aligns with your values, goals, and preferences. Provides guidance to your family and loved ones, reducing the emotional burden of decision-making during critical moments.  Information on Advanced Care Planning can be found at [place]  Secretary of [facility] Advance Health Care Directives Advance Health Care Directives ([email or web address]   Vision: Annual vision screenings are recommended for early detection of glaucoma, cataracts, and diabetic retinopathy. These exams can also reveal signs of chronic conditions such as diabetes and high blood pressure.  Dental: Annual dental screenings help detect early signs of oral cancer, gum disease, and other conditions linked to overall health, including heart disease and diabetes.  Please see the attached documents for additional preventive care recommendations.

## 2023-12-30 NOTE — Progress Notes (Signed)
 Subjective:   [name] [name] is a 73 y.o. who presents for a Medicare Wellness preventive visit.  As a reminder, Annual Wellness Visits don't include a physical exam, and some assessments may be limited, especially if this visit is performed virtually. We may recommend an in-person follow-up visit with your provider if needed.  Visit Complete: Virtual I connected with  [name] [name] on 12/30/23 by a audio enabled telemedicine application and verified that I am speaking with the correct person using two identifiers.  Patient Location: Home  Provider Location: Home Office  I discussed the limitations of evaluation and management by telemedicine. The patient expressed understanding and agreed to proceed.  Vital Signs: Because this visit was a virtual/telehealth visit, some criteria may be missing or patient reported. Any vitals not documented were not able to be obtained and vitals that have been documented are patient reported.  VideoDeclined- This patient declined Librarian, academic. Therefore the visit was completed with audio only.  Persons Participating in Visit: Patient.  AWV Questionnaire: No: Patient Medicare AWV questionnaire was not completed prior to this visit.  Cardiac Risk Factors include: advanced age (>70men, >51 women);male gender;hypertension     Objective:    Today's Vitals   12/30/23 1313  Weight: 196 lb (88.9 kg)  Height: 5' 8 (1.727 m)   Body mass index is 29.8 kg/m.     12/30/2023    1:17 PM 10/10/2023    9:32 AM 07/22/2023    8:14 AM 12/31/2022    8:33 PM 12/24/2021   10:02 AM 07/20/2021   10:44 AM 06/16/2021    9:18 AM  Advanced Directives  Does Patient Have a Medical Advance Directive? No No No No No No No  Would patient like information on creating a medical advance directive? Yes (MAU/Ambulatory/Procedural Areas - Information given) No - Patient declined No - Patient declined Yes (MAU/Ambulatory/Procedural Areas -  Information given) No - Patient declined No - Patient declined No - Patient declined    Current Medications (verified) Outpatient Encounter Medications as of 12/30/2023  Medication Sig   allopurinol  (ZYLOPRIM ) 100 MG tablet TAKE 1 TABLET BY MOUTH DAILY   amLODipine  (NORVASC ) 5 MG tablet TAKE 1 TABLET BY MOUTH DAILY   aspirin  81 MG chewable tablet Chew 81 mg by mouth daily.    atorvastatin  (LIPITOR) 40 MG tablet TAKE 1 TABLET BY MOUTH DAILY   B Complex-C (SUPER B COMPLEX PO) Take 1 tablet by mouth daily.   buPROPion  (WELLBUTRIN  XL) 150 MG 24 hr tablet TAKE 1 TABLET BY MOUTH DAILY  STOP CITALOPRAM    ezetimibe  (ZETIA ) 10 MG tablet Take 1 tablet (10 mg total) by mouth daily.   hydrocortisone  2.5 % cream Apply to groin area twice a day mix with Ketoconazole  cream   ketoconazole  (NIZORAL ) 2 % cream Apply to groin area twice a day mix with Hydrocortisone  cream   Multiple Vitamin (MULTIVITAMIN) tablet Take 1 tablet by mouth daily.   OVER THE COUNTER MEDICATION Otc Vit D daily  Systane eye drops three times per day.   diclofenac  (VOLTAREN ) 75 MG EC tablet TAKE (1) TABLET BY MOUTH TWICE A DAY AS NEEDED. (Patient not taking: Reported on 12/30/2023)   lisinopril  (ZESTRIL ) 5 MG tablet Take 5 mg by mouth daily. (Patient not taking: Reported on 12/30/2023)   No facility-administered encounter medications on file as of 12/30/2023.    Allergies (verified) Citalopram  and Lisinopril    History: Past Medical History:  Diagnosis Date   Aortic atherosclerosis  10/19/2016    Seen on chest x-ray July 2018   Arthritis    right knee and hip    Cancer [facility])    prostate cancer    Chronic kidney disease    prostate cancer    GERD (gastroesophageal reflux disease)    Gout    Gout    Hypercholesteremia    Hyperglycemia    Hyperlipidemia    Hypertension    Sleep apnea    Status post THR (total hip replacement) 04/20/13 05/01/2013   Right total hip 04/20/2013 Dr. Margrette Eric implant    Past  Surgical History:  Procedure Laterality Date   COLONOSCOPY     COLONOSCOPY N/A 10/22/2013   Procedure: COLONOSCOPY;  Surgeon: Margo LITTIE Haddock, MD;  Location: [facility];  Service: Endoscopy;  Laterality: N/A;  1115   COLONOSCOPY N/A 10/10/2023   Procedure: COLONOSCOPY;  Surgeon: [name] Carlin POUR, DO;  Location: [facility];  Service: Endoscopy;  Laterality: N/A;  10:45 am, ok rm 1/2   HERNIA REPAIR     right inguinal hernia repair    LUMBAR LAMINECTOMY/DECOMPRESSION MICRODISCECTOMY Right 03/16/2021   Procedure: Right Lumbar four-five Laminectomy/foraminotomy;  Surgeon: Joshua Alm RAMAN, MD;  Location: [facility] OR;  Service: Neurosurgery;  Laterality: Right;   NASAL SEPTUM SURGERY     OTHER SURGICAL HISTORY     deviated septum surgery    REVISION TOTAL HIP ARTHROPLASTY Right 09/10/2022   Dr. Glendia Blush   ROBOT ASSISTED LAPAROSCOPIC RADICAL PROSTATECTOMY  04/20/2011   Procedure: ROBOTIC ASSISTED LAPAROSCOPIC RADICAL PROSTATECTOMY;  Surgeon: Norleen JINNY Seltzer, MD;  Location: [facility] ORS;  Service: Urology;  Laterality: N/A;  Bilateral lymph node dissection   TOTAL HIP ARTHROPLASTY Right 04/20/2013   Procedure: TOTAL HIP ARTHROPLASTY;  Surgeon: Taft FORBES Margrette, MD;  Location: [facility] ORS;  Service: Orthopedics;  Laterality: Right;   TOTAL HIP ARTHROPLASTY Left 05/06/2022   Dr. Glendia Blush   TOTAL KNEE ARTHROPLASTY Right 05/16/2018   Procedure: TOTAL KNEE ARTHROPLASTY;  Surgeon: Margrette Taft FORBES, MD;  Location: [facility] ORS;  Service: Orthopedics;  Laterality: Right;   Family History  Problem Relation Age of Onset   Diabetes Mother    Hypertension Mother    Hypertension Brother    Social History   Socioeconomic History   Marital status: Single    Spouse name: Not on file   Number of children: Not on file   Years of education: Not on file   Highest education level: Bachelor's degree (e.g., BA, AB, BS)  Occupational History   Not on file  Tobacco Use   Smoking status: Former    Current  packs/day: 0.00    Average packs/day: 0.5 packs/day for 8.0 years (4.0 ttl pk-yrs)    Types: Cigarettes    Start date: 03/22/1976    Quit date: 03/22/1984    Years since quitting: 39.8   Smokeless tobacco: Never  Vaping Use   Vaping status: Never Used  Substance and Sexual Activity   Alcohol use: Not Currently    Alcohol/week: 1.0 standard drink of alcohol    Types: 1 Cans of beer per week    Comment: occasional    Drug use: No   Sexual activity: Yes    Birth control/protection: None  Other Topics Concern   Not on file  Social History Narrative   Not on file   Social Drivers of Health   Financial Resource Strain: Medium Risk (12/30/2023)   Overall Financial Resource Strain (CARDIA)    Difficulty  of Paying Living Expenses: Somewhat hard  Food Insecurity: No Food Insecurity (12/30/2023)   Hunger Vital Sign    Worried About Running Out of Food in the Last Year: Never true    Ran Out of Food in the Last Year: Never true  Transportation Needs: No Transportation Needs (12/30/2023)   PRAPARE - Administrator, Civil Service (Medical): No    Lack of Transportation (Non-Medical): No  Physical Activity: Insufficiently Active (12/30/2023)   Exercise Vital Sign    Days of Exercise per Week: 2 days    Minutes of Exercise per Session: 10 min  Stress: No Stress Concern Present (12/30/2023)   [employer] of Occupational Health - Occupational Stress Questionnaire    Feeling of Stress: Only a little  Social Connections: Socially Isolated (12/30/2023)   Social Connection and Isolation Panel    Frequency of Communication with Friends and Family: Once a week    Frequency of Social Gatherings with Friends and Family: Never    Attends Religious Services: Never    Database administrator or Organizations: No    Attends Engineer, structural: Never    Marital Status: Living with partner    Tobacco Counseling Counseling given: Not Answered    Clinical  Intake:  Pre-visit preparation completed: Yes  Pain : No/denies pain  Diabetes: No  Lab Results  Component Value Date   HGBA1C 5.5 11/26/2020   HGBA1C 5.6 05/27/2020   HGBA1C 5.8 (H) 04/04/2019     How often do you need to have someone help you when you read instructions, pamphlets, or other written materials from your doctor or pharmacy?: 1 - Never  Interpreter Needed?: No  Information entered by :: Charmaine Bloodgood LPN   Activities of Daily Living     12/30/2023    1:13 PM 12/31/2022    9:24 AM  In your present state of health, do you have any difficulty performing the following activities:  Hearing? 0 0  Vision? 0 1  Difficulty concentrating or making decisions? 0 0  Walking or climbing stairs? 1 0  Dressing or bathing? 0 0  Doing errands, shopping? 0 1  Preparing Food and eating ? N Y  Using the Toilet? N N  In the past six months, have you accidently leaked urine? N Y  Do you have problems with loss of bowel control? N N  Managing your Medications? N N  Managing your Finances? N N  Housekeeping or managing your Housekeeping? N N    Patient Care Team: Alphonsa Glendia LABOR, MD as PCP - General (Family Medicine) Delford Maude BROCKS, MD as PCP - Cardiology (Cardiology) Tanda Glendia, MD as Referring Physician (Orthopedic Surgery) [facility], [facility]  Magda Debby SAILOR, MD as Consulting Physician (Vascular Surgery) [name] Carlin POUR, DO as Consulting Physician (Gastroenterology)  I have updated your Care Teams any recent Medical Services you may have received from other providers in the past year.     Assessment:   This is a routine wellness examination for [place].  Hearing/Vision screen Hearing Screening - Comments:: Patient is able to hear conversational tones without difficulty. No issues reported.   Vision Screening - Comments:: Wears rx glasses - up to date with routine eye exams with [facility].    Goals Addressed             This  Visit's Progress    Maintain health and independence   On track      Depression Screen  12/30/2023    1:16 PM 06/30/2023    8:31 AM 03/02/2023   11:27 AM 12/31/2022    8:31 PM 10/26/2022    4:12 PM 08/11/2022   10:52 AM 12/24/2021   10:01 AM  PHQ 2/9 Scores  PHQ - 2 Score 2 4 5 2 3 3 2   PHQ- 9 Score 6 11 13 6 7 5 4     Fall Risk     12/30/2023    1:17 PM 06/30/2023    8:31 AM 12/31/2022    9:24 AM 10/26/2022    4:12 PM 08/11/2022   10:52 AM  Fall Risk   Falls in the past year? 0 0 1 1 1   Number falls in past yr: 0  0 0   Injury with Fall? 0 0 0 0 0  Risk for fall due to : Impaired mobility;Impaired balance/gait;Orthopedic patient  History of fall(s);Impaired balance/gait;Impaired mobility  Impaired balance/gait;Impaired mobility  Follow up Falls prevention discussed;Education provided;Falls evaluation completed  Falls prevention discussed;Education provided;Falls evaluation completed  Falls evaluation completed    MEDICARE RISK AT HOME:  Medicare Risk at Home Any stairs in or around the home?: No If so, are there any without handrails?: No Home free of loose throw rugs in walkways, pet beds, electrical cords, etc?: Yes Adequate lighting in your home to reduce risk of falls?: Yes Life alert?: No Use of a cane, walker or w/c?: Yes Grab bars in the bathroom?: Yes Shower chair or bench in shower?: No Elevated toilet seat or a handicapped toilet?: Yes  TIMED UP AND GO:  Was the test performed?  No  Cognitive Function: 6CIT completed        12/30/2023    1:18 PM 12/31/2022    8:34 PM 12/24/2021   10:04 AM 12/14/2020    8:23 AM  6CIT Screen  What Year? 0 points 0 points 0 points 0 points  What month? 0 points 0 points 0 points 0 points  What time? 0 points 0 points 0 points 0 points  Count back from 20 0 points 0 points 0 points 0 points  Months in reverse 0 points 0 points 0 points 0 points  Repeat phrase 0 points 0 points 0 points 0 points  Total Score 0 points 0  points 0 points 0 points    Immunizations Immunization History  Administered Date(s) Administered   Fluad Quad(high Dose 65+) 12/03/2020, 12/03/2021   Fluad Trivalent(High Dose 65+) 11/29/2022   INFLUENZA, HIGH DOSE SEASONAL PF 12/20/2023   Influenza,inj,Quad PF,6+ Mos 01/17/2014, 12/12/2014, 01/19/2016, 01/18/2017, 01/19/2018, 01/09/2019   Influenza-Unspecified 01/04/2020   Moderna Sars-Covid-2 Vaccination 05/03/2019, 06/01/2019, 01/19/2020   Pneumococcal Conjugate-13 01/17/2014   Pneumococcal Polysaccharide-23 03/04/2015   Zoster Recombinant(Shingrix) 11/11/2017, 04/12/2018   Zoster, Live 03/20/2014    Screening Tests Health Maintenance  Topic Date Due   DTaP/Tdap/Td (1 - Tdap) Never done   Diabetic kidney evaluation - Urine ACR  08/06/2023   COVID-19 Vaccine (5 - 2025-26 season) 11/21/2023   Diabetic kidney evaluation - eGFR measurement  06/14/2024   Medicare Annual Wellness (AWV)  12/29/2024   Colonoscopy  10/09/2033   Pneumococcal Vaccine: 50+ Years  Completed   Influenza Vaccine  Completed   Hepatitis C Screening  Completed   Zoster Vaccines- Shingrix  Completed   Meningococcal B Vaccine  Aged Out    Health Maintenance Items Addressed: Patient is up to date   Additional Screening:  Vision Screening: Recommended annual ophthalmology exams for early detection of glaucoma and other  disorders of the eye. Is the patient up to date with their annual eye exam?  Yes  Who is the provider or what is the name of the office in which the patient attends annual eye exams? [facility].   Dental Screening: Recommended annual dental exams for proper oral hygiene  Community Resource Referral / Chronic Care Management: CRR required this visit?  No   CCM required this visit?  No   Plan:    I have personally reviewed and noted the following in the patient's chart:   Medical and social history Use of alcohol, tobacco or illicit drugs  Current medications and supplements  including opioid prescriptions. Patient is not currently taking opioid prescriptions. Functional ability and status Nutritional status Physical activity Advanced directives List of other physicians Hospitalizations, surgeries, and ER visits in previous 12 months Vitals Screenings to include cognitive, depression, and falls Referrals and appointments  In addition, I have reviewed and discussed with patient certain preventive protocols, quality metrics, and best practice recommendations. A written personalized care plan for preventive services as well as general preventive health recommendations were provided to patient.   Lavelle Pfeiffer [place], [place]   [id number]/89/[id number]   After Visit Summary: (MyChart) Due to this being a telephonic visit, the after visit summary with patients personalized plan was offered to patient via MyChart   Notes: Nothing significant to report at this time.

## 2024-01-03 DIAGNOSIS — I10 Essential (primary) hypertension: Secondary | ICD-10-CM | POA: Diagnosis not present

## 2024-01-03 DIAGNOSIS — Z79899 Other long term (current) drug therapy: Secondary | ICD-10-CM | POA: Diagnosis not present

## 2024-01-03 DIAGNOSIS — E785 Hyperlipidemia, unspecified: Secondary | ICD-10-CM | POA: Diagnosis not present

## 2024-01-04 ENCOUNTER — Ambulatory Visit: Payer: Self-pay | Admitting: Family Medicine

## 2024-01-04 LAB — LIPID PANEL
Chol/HDL Ratio: 3.6 ratio (ref 0.0–5.0)
Cholesterol, Total: 169 mg/dL (ref 100–199)
HDL: 47 mg/dL (ref >39)
LDL Chol Calc (NIH): 98 mg/dL (ref 0–99)
Triglycerides: 138 mg/dL (ref 0–149)
VLDL Cholesterol Cal: 24 mg/dL (ref 5–40)

## 2024-01-04 LAB — MICROALBUMIN / CREATININE URINE RATIO
Creatinine, Urine: 154.1 mg/dL
Microalb/Creat Ratio: 14 mg/g{creat} (ref 0–29)
Microalbumin, Urine: 21 ug/mL

## 2024-01-04 LAB — BASIC METABOLIC PANEL WITH GFR
BUN/Creatinine Ratio: 12 (ref 10–24)
BUN: 15 mg/dL (ref 8–27)
CO2: 23 mmol/L (ref 20–29)
Calcium: 9.2 mg/dL (ref 8.6–10.2)
Chloride: 103 mmol/L (ref 96–106)
Creatinine, Ser: 1.24 mg/dL (ref 0.76–1.27)
Glucose: 116 mg/dL — ABNORMAL HIGH (ref 70–99)
Potassium: 4.8 mmol/L (ref 3.5–5.2)
Sodium: 141 mmol/L (ref 134–144)
eGFR: 61 mL/min/1.73 (ref >59)

## 2024-01-04 LAB — HEPATIC FUNCTION PANEL
ALT: 22 IU/L (ref 0–44)
AST: 19 IU/L (ref 0–40)
Albumin: 4.6 g/dL (ref 3.8–4.8)
Alkaline Phosphatase: 136 IU/L — ABNORMAL HIGH (ref 47–123)
Bilirubin Total: 1.4 mg/dL — ABNORMAL HIGH (ref 0.0–1.2)
Bilirubin, Direct: 0.36 mg/dL (ref 0.00–0.40)
Total Protein: 7.2 g/dL (ref 6.0–8.5)

## 2024-01-05 ENCOUNTER — Ambulatory Visit: Admitting: Family Medicine

## 2024-01-05 ENCOUNTER — Encounter: Payer: Self-pay | Admitting: Family Medicine

## 2024-01-05 VITALS — BP 124/76 | HR 91 | Temp 98.4°F | Ht 68.0 in | Wt 192.0 lb

## 2024-01-05 DIAGNOSIS — Z79899 Other long term (current) drug therapy: Secondary | ICD-10-CM | POA: Diagnosis not present

## 2024-01-05 DIAGNOSIS — R739 Hyperglycemia, unspecified: Secondary | ICD-10-CM | POA: Diagnosis not present

## 2024-01-05 DIAGNOSIS — E785 Hyperlipidemia, unspecified: Secondary | ICD-10-CM

## 2024-01-05 DIAGNOSIS — I10 Essential (primary) hypertension: Secondary | ICD-10-CM | POA: Diagnosis not present

## 2024-01-05 DIAGNOSIS — I2510 Atherosclerotic heart disease of native coronary artery without angina pectoris: Secondary | ICD-10-CM

## 2024-01-05 MED ORDER — ATORVASTATIN CALCIUM 80 MG PO TABS: 80.0000 mg | ORAL_TABLET | Freq: Every day | ORAL | 1 refills | Status: AC

## 2024-01-05 MED ORDER — BUPROPION HCL ER (XL) 150 MG PO TB24: ORAL_TABLET | ORAL | 3 refills | Status: AC

## 2024-01-05 MED ORDER — ALLOPURINOL 100 MG PO TABS
100.0000 mg | ORAL_TABLET | Freq: Every day | ORAL | 2 refills | Status: AC
Start: 2024-01-05 — End: ?

## 2024-01-05 MED ORDER — AMLODIPINE BESYLATE 5 MG PO TABS: 5.0000 mg | ORAL_TABLET | Freq: Every day | ORAL | 2 refills | Status: AC

## 2024-01-05 NOTE — Progress Notes (Signed)
 Subjective:    Patient ID: [name] [name], male    DOB: 1950-06-15, 73 y.o.   MRN: [id number]  HPI 6 month follow up  Discussed the use of AI scribe software for clinical note transcription with the patient, who gave verbal consent to proceed.  Discussed the use of AI scribe software for clinical note transcription with the patient, who gave verbal consent to proceed.  History of Present Illness   [name] [name] is a 73 year old male who presents for a follow-up visit.   He is on a waiting list for senior housing in [place]  and plans to move after winter. He experiences issues with ambulation, noting improvement but still experiencing weakness in his knee, which causes him to fall when getting on the floor. He has been trying to exercise more frequently at the [facility], although he is unsure if they have the specific equipment he prefers.  He discusses his dietary habits, admitting to not following his diet well recently due to various personal issues. He avoids fast food but often eats hot dogs for lunch, acknowledging the high salt content. He is consistent with his medication regimen, taking allopurinol , amlodipine , atorvastatin , Wellbutrin , Zetia , and aspirin  regularly. He no longer takes diclofenac  or lisinopril .  He mentions experiencing fatigue during the day, feeling the need to nap after breakfast. He does not snore and sleeps well at night, waking only once to use the bathroom. He drinks two cups of coffee in the morning and does not consume caffeine otherwise.  He has had recent ultrasounds on his legs. He does not experience significant pain, only tiredness after walking extensively. He has had a flu shot and COVID-19 vaccine recently.        Review of Systems Urine micro protein negative, cholesterol profile good but LDL not at goal currently, liver enzymes look good, alkaline phosphatase slightly elevated, glucose elevated, kidney functions look good     Results for orders placed or performed in visit on 11/23/23  ECHOCARDIOGRAM COMPLETE   Collection Time: 11/24/23 10:38 AM  Result Value Ref Range   S' Lateral 2.40 cm   Area-P 1/2 5.27 cm2   Single Plane A2C EF 52.6 %   Single Plane A4C EF 54.0 %   Calc EF 56.6 %   AR max vel 3.77 cm2   P 1/2 time 535 msec   Ao pk vel 1.06 m/s   AV Peak grad 4.5 mmHg   Est EF 60 - 65%      Physical Exam  General-in no acute distress Eyes-no discharge Lungs-respiratory rate normal, CTA CV-no murmurs,RRR Extremities skin warm dry no edema Neuro grossly normal Behavior normal, alert  Peripheral artery disease of lower extremities Mild arterial buildup without significant symptoms. - Encouraged staying active to maintain circulation.has no claudication sx.  Difficulty with ambulation due to knee weakness Knee weakness affects mobility. He is exercising at the [facility] to improve strength. - Encouraged regular exercise, aiming for five days a week at the [facility].  Hyperlipidemia LDL cholesterol at 98, above target. Current atorvastatin  dose is well tolerated. - Increase atorvastatin  to 80 mg daily. - Monitor for joint pain or discomfort.  Fatty liver Slightly elevated alkaline phosphatase, likely related to fatty liver. - Encouraged healthy eating.  Thickened toenails Difficulty cutting toenails due to thickness. - Consider podiatrist or pedicure for toenail care.     Assessment & Plan:   1. Benign essential hypertension Blood pressure decent control continue current  measures healthy diet recommended - amLODipine  (NORVASC ) 5 MG tablet; Take 1 tablet (5 mg total) by mouth daily.  Dispense: 100 tablet; Refill: 2  2. Hyperlipidemia, unspecified hyperlipidemia type (Primary) Patient to increased dose of atorvastatin  to 80 mg if it causes any problems let us  know goal is to get LDL below 70 and if possible below 55   4. High risk medication use Labs ordered  5. Coronary artery disease  involving native coronary artery of native heart without angina pectoris Under decent control currently but very important to get LDL under good control keep blood pressure under good control healthy eating and activity  6. Hyperglycemia Check A1c on the next visit PSA in the spring time 91 He unfortunately got at order and so of I doing this since guide DM she is

## 2024-01-09 ENCOUNTER — Other Ambulatory Visit: Payer: Self-pay | Admitting: Family Medicine

## 2024-04-03 NOTE — Progress Notes (Signed)
 [name] [name]                                          MRN: [id number]   04/03/2024   The [facility] Quality Team Specialist reviewed this patient medical record for the purposes of chart review for care gap closure. The following were reviewed: chart review for care gap closure-glycemic status assessment.    [facility]I Quality Team

## 2024-07-05 ENCOUNTER — Ambulatory Visit: Admitting: Family Medicine

## 2025-01-04 ENCOUNTER — Ambulatory Visit
